# Patient Record
Sex: Male | Born: 1945
Health system: Southern US, Community
[De-identification: ages and names within clinical notes are randomized; demographics above are authoritative.]

## PROBLEM LIST (undated history)

## (undated) DIAGNOSIS — I4510 Unspecified right bundle-branch block: Secondary | ICD-10-CM

## (undated) DIAGNOSIS — R9431 Abnormal electrocardiogram [ECG] [EKG]: Secondary | ICD-10-CM

## (undated) DIAGNOSIS — I442 Atrioventricular block, complete: Secondary | ICD-10-CM

## (undated) DIAGNOSIS — G4733 Obstructive sleep apnea (adult) (pediatric): Secondary | ICD-10-CM

## (undated) DIAGNOSIS — R251 Tremor, unspecified: Secondary | ICD-10-CM

## (undated) DIAGNOSIS — J690 Pneumonitis due to inhalation of food and vomit: Secondary | ICD-10-CM

## (undated) DIAGNOSIS — G4730 Sleep apnea, unspecified: Secondary | ICD-10-CM

## (undated) DIAGNOSIS — G8929 Other chronic pain: Secondary | ICD-10-CM

## (undated) DIAGNOSIS — E785 Hyperlipidemia, unspecified: Secondary | ICD-10-CM

## (undated) DIAGNOSIS — F32A Depression, unspecified: Secondary | ICD-10-CM

## (undated) DIAGNOSIS — M51369 Other intervertebral disc degeneration, lumbar region without mention of lumbar back pain or lower extremity pain: Secondary | ICD-10-CM

## (undated) DIAGNOSIS — S83249A Other tear of medial meniscus, current injury, unspecified knee, initial encounter: Secondary | ICD-10-CM

## (undated) DIAGNOSIS — K219 Gastro-esophageal reflux disease without esophagitis: Secondary | ICD-10-CM

## (undated) DIAGNOSIS — G2581 Restless legs syndrome: Secondary | ICD-10-CM

## (undated) DIAGNOSIS — K4090 Unilateral inguinal hernia, without obstruction or gangrene, not specified as recurrent: Secondary | ICD-10-CM

## (undated) DIAGNOSIS — Z87442 Personal history of urinary calculi: Secondary | ICD-10-CM

## (undated) DIAGNOSIS — N200 Calculus of kidney: Secondary | ICD-10-CM

## (undated) DIAGNOSIS — N1831 Chronic kidney disease, stage 3a: Secondary | ICD-10-CM

## (undated) DIAGNOSIS — M2578 Osteophyte, vertebrae: Secondary | ICD-10-CM

## (undated) DIAGNOSIS — M5030 Other cervical disc degeneration, unspecified cervical region: Secondary | ICD-10-CM

## (undated) DIAGNOSIS — M545 Low back pain, unspecified: Secondary | ICD-10-CM

## (undated) DIAGNOSIS — F419 Anxiety disorder, unspecified: Secondary | ICD-10-CM

## (undated) DIAGNOSIS — K5730 Diverticulosis of large intestine without perforation or abscess without bleeding: Secondary | ICD-10-CM

## (undated) DIAGNOSIS — I700 Atherosclerosis of aorta: Secondary | ICD-10-CM

## (undated) DIAGNOSIS — R29818 Other symptoms and signs involving the nervous system: Secondary | ICD-10-CM

## (undated) DIAGNOSIS — K760 Fatty (change of) liver, not elsewhere classified: Secondary | ICD-10-CM

## (undated) DIAGNOSIS — M1990 Unspecified osteoarthritis, unspecified site: Secondary | ICD-10-CM

## (undated) DIAGNOSIS — E119 Type 2 diabetes mellitus without complications: Secondary | ICD-10-CM

## (undated) DIAGNOSIS — I10 Essential (primary) hypertension: Secondary | ICD-10-CM

## (undated) DIAGNOSIS — Z7982 Long term (current) use of aspirin: Secondary | ICD-10-CM

## (undated) DIAGNOSIS — R918 Other nonspecific abnormal finding of lung field: Secondary | ICD-10-CM

## (undated) DIAGNOSIS — I119 Hypertensive heart disease without heart failure: Secondary | ICD-10-CM

## (undated) DIAGNOSIS — M4306 Spondylolysis, lumbar region: Secondary | ICD-10-CM

## (undated) DIAGNOSIS — M5136 Other intervertebral disc degeneration, lumbar region: Secondary | ICD-10-CM

## (undated) DIAGNOSIS — I2510 Atherosclerotic heart disease of native coronary artery without angina pectoris: Secondary | ICD-10-CM

## (undated) DIAGNOSIS — N529 Male erectile dysfunction, unspecified: Secondary | ICD-10-CM

## (undated) DIAGNOSIS — R259 Unspecified abnormal involuntary movements: Secondary | ICD-10-CM

## (undated) DIAGNOSIS — K649 Unspecified hemorrhoids: Secondary | ICD-10-CM

## (undated) DIAGNOSIS — M549 Dorsalgia, unspecified: Secondary | ICD-10-CM

## (undated) HISTORY — DX: Abnormal electrocardiogram (ECG) (EKG): R94.31

## (undated) HISTORY — DX: Unspecified abnormal involuntary movements: R25.9

## (undated) HISTORY — DX: Sleep apnea, unspecified: G47.30

## (undated) HISTORY — DX: Hyperlipidemia, unspecified: E78.5

## (undated) HISTORY — PX: CIRCUMCISION: SUR203

## (undated) HISTORY — DX: Other symptoms and signs involving the nervous system: R29.818

## (undated) HISTORY — PX: HERNIA REPAIR: SHX51

## (undated) HISTORY — DX: Calculus of kidney: N20.0

## (undated) HISTORY — DX: Unspecified hemorrhoids: K64.9

---

## 1999-06-03 ENCOUNTER — Ambulatory Visit: Admission: RE | Admit: 1999-06-03 | Discharge: 1999-06-03 | Payer: Self-pay | Admitting: Family Medicine

## 2004-11-28 ENCOUNTER — Ambulatory Visit: Payer: Self-pay | Admitting: Family Medicine

## 2005-06-25 HISTORY — PX: COLONOSCOPY: SHX174

## 2005-12-20 ENCOUNTER — Ambulatory Visit: Payer: Self-pay | Admitting: General Surgery

## 2005-12-20 LAB — HM COLONOSCOPY

## 2006-06-12 ENCOUNTER — Ambulatory Visit: Payer: Self-pay | Admitting: Family Medicine

## 2010-01-09 ENCOUNTER — Ambulatory Visit: Payer: Self-pay | Admitting: Family Medicine

## 2010-05-10 ENCOUNTER — Ambulatory Visit: Payer: Self-pay | Admitting: Family Medicine

## 2010-12-18 ENCOUNTER — Ambulatory Visit: Payer: Self-pay | Admitting: Family Medicine

## 2011-04-05 ENCOUNTER — Ambulatory Visit: Payer: Self-pay | Admitting: Family Medicine

## 2012-04-22 ENCOUNTER — Ambulatory Visit: Payer: Self-pay | Admitting: Family Medicine

## 2012-06-25 HISTORY — PX: ROTATOR CUFF REPAIR: SHX139

## 2012-06-26 ENCOUNTER — Ambulatory Visit: Payer: Self-pay | Admitting: Specialist

## 2012-07-10 ENCOUNTER — Ambulatory Visit: Payer: Self-pay | Admitting: Specialist

## 2012-07-10 LAB — HEMOGLOBIN: HGB: 16.9 g/dL (ref 13.0–18.0)

## 2012-07-10 LAB — POTASSIUM: Potassium: 4.3 mmol/L (ref 3.5–5.1)

## 2012-07-23 ENCOUNTER — Ambulatory Visit: Payer: Self-pay | Admitting: Specialist

## 2013-03-11 ENCOUNTER — Ambulatory Visit: Payer: Self-pay | Admitting: Otolaryngology

## 2013-06-22 ENCOUNTER — Ambulatory Visit: Payer: Self-pay | Admitting: Family Medicine

## 2013-09-01 ENCOUNTER — Ambulatory Visit: Payer: Self-pay | Admitting: Specialist

## 2013-11-30 DIAGNOSIS — M5136 Other intervertebral disc degeneration, lumbar region: Secondary | ICD-10-CM | POA: Insufficient documentation

## 2013-11-30 DIAGNOSIS — M549 Dorsalgia, unspecified: Secondary | ICD-10-CM | POA: Insufficient documentation

## 2014-05-25 ENCOUNTER — Ambulatory Visit: Payer: Self-pay | Admitting: Family Medicine

## 2014-07-13 LAB — LIPID PANEL
Cholesterol: 172 mg/dL (ref 0–200)
HDL: 28 mg/dL — AB (ref 35–70)
LDL CALC: 100 mg/dL
LDl/HDL Ratio: 3.6
Triglycerides: 222 mg/dL — AB (ref 40–160)

## 2014-07-13 LAB — BASIC METABOLIC PANEL
BUN: 16 mg/dL (ref 4–21)
Creatinine: 1.3 mg/dL (ref 0.6–1.3)
GLUCOSE: 256 mg/dL
Potassium: 4.4 mmol/L (ref 3.4–5.3)
Sodium: 136 mmol/L — AB (ref 137–147)

## 2014-07-13 LAB — TSH: TSH: 2.1 u[IU]/mL (ref 0.41–5.90)

## 2014-07-13 LAB — CBC AND DIFFERENTIAL
HCT: 49 % (ref 41–53)
HEMOGLOBIN: 16.7 g/dL (ref 13.5–17.5)
Neutrophils Absolute: 4 /uL
Platelets: 186 10*3/uL (ref 150–399)
WBC: 8.3 10*3/mL

## 2014-07-13 LAB — HEMOGLOBIN A1C: Hgb A1c MFr Bld: 8.7 % — AB (ref 4.0–6.0)

## 2014-08-17 LAB — HEPATIC FUNCTION PANEL
ALT: 81 U/L — AB (ref 10–40)
AST: 100 U/L — AB (ref 14–40)
Alkaline Phosphatase: 190 U/L — AB (ref 25–125)
Bilirubin, Total: 0.8 mg/dL

## 2014-08-20 ENCOUNTER — Ambulatory Visit: Payer: Self-pay | Admitting: Family Medicine

## 2014-10-14 LAB — PSA: PSA: 0.3

## 2014-10-15 NOTE — Op Note (Signed)
PATIENT NAME:  Manuel Stafford MR#:  [id number] DATE OF BIRTH:  Oct 20, 1945  DATE OF PROCEDURE:  07/23/2012  PREOPERATIVE DIAGNOSES:  1.  Large rotator cuff tear, right shoulder.  2.  Severe acromioclavicular degenerative arthritis, right shoulder.   POSTOPERATIVE DIAGNOSES:  1.  Large rotator cuff tear, right shoulder.  2.  Severe acromioclavicular degenerative arthritis, right shoulder.   PROCEDURES: 1.  Open repair rotator cuff tear, right shoulder.  2.  Open excision, distal right clavicle.   SURGEON: Manuel Stafford, M.D.   ANESTHESIA:  General.  COMPLICATIONS: None.   ESTIMATED BLOOD LOSS: 125 mL.   DESCRIPTION OF PROCEDURE: Ancef 2 grams was given intravenously prior to the procedure. General anesthesia is induced. The patient is placed in the semi-beach chair position on the operating room table and secured with the right shoulder elevated using the beanbag. The right shoulder and arm were thoroughly prepped with alcohol and ChloraPrep and draped in standard sterile fashion. First, a transverse incision is made after infiltrating with 1% Xylocaine with epinephrine over the distal clavicle. The distal clavicle is seen to be extremely large and hypertrophic. The distal 1/2 inch of the clavicle is excised using the saw blade, the periosteal elevator and the rongeur. Hemostasis is maintained with the Bovie. The wound is thoroughly irrigated multiple times. The fascia and the subcutaneous tissue were closed over this using 2-0 Vicryl, and the skin is closed with the skin stapler. A separate anterior incision is then made over the midportion of the anterior acromion and extended distally. The dissection is carried down to the acromion, and the fascia is carefully reflected off of the acromion and preserved. The deltoid muscle is split for approximately 1-1/2 inches down to the deltopectoral fascia. This is incised, and there is immediately seen to be a large tear of the rotator cuff. This  actually consists of 2 tears.  There is a large triangular tear of the supraspinatus tendon, and then anteriorly, there is seen to be a significant tear of the subscapularis tendon. Anterior acromioplasty is performed with the rongeur and the oscillating rasp. The rotator cuff then is mobilized using 2 separate #2 Ethibond sutures in the edge. The rotator cuff is mobilized as much as possible using the finger and the periosteal elevator. The large V tear in the supraspinatus tendon is repaired first using interrupted #2 Ethibond sutures. The final position of the tear in the greater tuberosity area is then prepared using the oscillating rasp, and the edge of the supraspinatus is sewn down using #2 Ethibond in drill holes.  Range of motion demonstrated this portion of the repair to be secure. Attention is then placed anteriorly, where the residual portion of the subscapularis is mobilized and brought up and repaired likewise with #2 Ethibond sutures to the biceps tendon and the anterior leading edge of the supraspinatus tendon. Range of motion demonstrated satisfactory security of the repair. The wound is thoroughly irrigated multiple times. The deltoid is repaired with #2 Vicryl sutures. The subcutaneous tissue is infiltrated with 0.25% Marcaine with epinephrine. Subcutaneous tissue is closed with 3-0 Vicryl, and the skin is closed with the skin stapler. The subacromial bursa is then carefully injected with 10 mL of 0.25% Marcaine with epinephrine and 4 mg of morphine. A soft bulky dressing is applied. The patient's arm is then placed in an abduction-type sling and secured. The patient is returned to the recovery room in satisfactory condition, having tolerated the procedure quite well.     ____________________________ Manuel Stafford  Manuel Sacramento, MD ces:dm D: 07/24/2012 07:58:04 ET T: 07/24/2012 10:05:11 ET JOB#: [id number]  cc: Manuel Mallow, MD, <Dictator> Manuel Mallow MD ELECTRONICALLY SIGNED  07/24/2012 13:49

## 2014-12-28 ENCOUNTER — Encounter: Payer: Self-pay | Admitting: Urology

## 2014-12-28 ENCOUNTER — Ambulatory Visit (INDEPENDENT_AMBULATORY_CARE_PROVIDER_SITE_OTHER): Payer: Medicare HMO | Admitting: Urology

## 2014-12-28 DIAGNOSIS — R3129 Other microscopic hematuria: Secondary | ICD-10-CM

## 2014-12-28 DIAGNOSIS — R312 Other microscopic hematuria: Secondary | ICD-10-CM

## 2014-12-28 DIAGNOSIS — N471 Phimosis: Secondary | ICD-10-CM

## 2014-12-28 MED ORDER — CIPROFLOXACIN HCL 500 MG PO TABS
500.0000 mg | ORAL_TABLET | Freq: Once | ORAL | Status: AC
  Administered 2014-12-28: 500 mg via ORAL

## 2014-12-28 MED ORDER — LIDOCAINE HCL 2 % EX GEL
1.0000 "application " | Freq: Once | CUTANEOUS | Status: AC
  Administered 2014-12-28: 1 via URETHRAL

## 2014-12-28 NOTE — Progress Notes (Signed)
    12/28/2014 5:29 PM   Manuel Stafford. 1946-03-03 [id number]  Referring provider: No referring provider defined for this encounter.  No chief complaint on file.   HPI: Microhematuria postcircumcision. We'll need to do a cystoscopy today. I discussed this with the patient he realizes the importance of cystoscopy. We will also obtain a CAT scan on him if he's not had   PMH: History reviewed. No pertinent past medical history.  Surgical History: History reviewed. No pertinent past surgical history.  Home Medications:    Medication List    Notice  As of 12/28/2014  5:29 PM   You have not been prescribed any medications.      Allergies: Not on File  Family History: History reviewed. No pertinent family history.  Social History:  has no tobacco, alcohol, and drug history on file.  ROS: Urological Symptom Review  Patient is experiencing the following symptoms: Blood in urine   Review of Systems  Gastrointestinal (upper)  : Negative for upper GI symptoms  Gastrointestinal (lower) : Negative for lower GI symptoms  Constitutional : Negative for symptoms  Skin: Negative for skin symptoms  Eyes: Negative for eye symptoms  Ear/Nose/Throat : Negative for Ear/Nose/Throat symptoms  Hematologic/Lymphatic: Negative for Hematologic/Lymphatic symptoms  Cardiovascular : Negative for cardiovascular symptoms  Respiratory : Negative for respiratory symptoms  Endocrine: Negative for endocrine symptoms  Musculoskeletal: Negative for musculoskeletal symptoms  Neurological: Negative for neurological symptoms  Psychologic: Negative for psychiatric symptoms   Physical Exam: There were no vitals taken for this visit.  Constitutional:  Alert and oriented, No acute distress. HEENT: [place] AT, moist mucus membranes.  Trachea midline, no masses. Cardiovascular: No clubbing, cyanosis, or edema. Respiratory: Normal respiratory effort, no increased work of  breathing. GI: Abdomen is soft, nontender, nondistended, no abdominal masses GU: No CVA tenderness. Circumcision healing well Skin: No rashes, bruises or suspicious lesions. Lymph: No cervical or inguinal adenopathy. Neurologic: Grossly intact, no focal deficits, moving all 4 extremities. Psychiatric: Normal mood and affect.  Laboratory Data: Lab Results  Component Value Date   HGB 16.9 07/10/2012    No results found for: CREATININE  No results found for: PSA  No results found for: TESTOSTERONE  No results found for: HGBA1C  Urinalysis No results found for: COLORURINE, APPEARANCEUR, LABSPEC, PHURINE, GLUCOSEU, HGBUR, BILIRUBINUR, KETONESUR, PROTEINUR, UROBILINOGEN, NITRITE, LEUKOCYTESUR  Pertinent imaging-none  Assessment & Plan:  Cystoscopy for microhematuria  1. Phimosis  - Urinalysis, Complete   No Follow-up on file.  Collier Flowers, [facility] [address] [place], [place] [address] [phone]   Needed .Cystoscopy Procedure Note  Patient identification was confirmed, informed consent was obtained, and patient was prepped using Betadine solution.  Lidocaine jelly was administered per urethral meatus.    Preoperative abx where received prior to procedure.

## 2014-12-28 NOTE — Progress Notes (Signed)
    Cystoscopy Procedure Note  Patient identification was confirmed, informed consent was obtained, and patient was prepped using Betadine solution.  Lidocaine jelly was administered per urethral meatus.    Preoperative abx where received prior to procedure.     Pre-Procedure: - Inspection reveals a normal caliber ureteral meatus.  Procedure: The flexible cystoscope was introduced without difficulty - No urethral strictures/lesions are present. -  prostate normal prostate and no enlargement - Normal bladder neck - Bilateral ureteral orifices identified - Bladder mucosa  reveals no ulcers, tumors, or lesions - No bladder stones - No trabeculation  Retroflexion shows retroflexion shows a normal bladder neck and no tumors   Post-Procedure: - Patient tolerated the procedure well

## 2014-12-29 LAB — URINALYSIS, COMPLETE
Bilirubin, UA: NEGATIVE
Leukocytes, UA: NEGATIVE
Nitrite, UA: NEGATIVE
Protein, UA: NEGATIVE
SPEC GRAV UA: 1.02 (ref 1.005–1.030)
UUROB: 0.2 mg/dL (ref 0.2–1.0)
pH, UA: 5 (ref 5.0–7.5)

## 2014-12-29 LAB — MICROSCOPIC EXAMINATION: Bacteria, UA: NONE SEEN

## 2014-12-31 DIAGNOSIS — E559 Vitamin D deficiency, unspecified: Secondary | ICD-10-CM | POA: Insufficient documentation

## 2014-12-31 DIAGNOSIS — I10 Essential (primary) hypertension: Secondary | ICD-10-CM | POA: Insufficient documentation

## 2014-12-31 DIAGNOSIS — G4730 Sleep apnea, unspecified: Secondary | ICD-10-CM | POA: Insufficient documentation

## 2014-12-31 DIAGNOSIS — K760 Fatty (change of) liver, not elsewhere classified: Secondary | ICD-10-CM | POA: Insufficient documentation

## 2014-12-31 DIAGNOSIS — K219 Gastro-esophageal reflux disease without esophagitis: Secondary | ICD-10-CM | POA: Insufficient documentation

## 2014-12-31 DIAGNOSIS — E119 Type 2 diabetes mellitus without complications: Secondary | ICD-10-CM | POA: Insufficient documentation

## 2014-12-31 DIAGNOSIS — N529 Male erectile dysfunction, unspecified: Secondary | ICD-10-CM | POA: Insufficient documentation

## 2014-12-31 DIAGNOSIS — M47817 Spondylosis without myelopathy or radiculopathy, lumbosacral region: Secondary | ICD-10-CM | POA: Insufficient documentation

## 2014-12-31 DIAGNOSIS — B029 Zoster without complications: Secondary | ICD-10-CM | POA: Insufficient documentation

## 2014-12-31 DIAGNOSIS — E669 Obesity, unspecified: Secondary | ICD-10-CM | POA: Insufficient documentation

## 2014-12-31 DIAGNOSIS — E785 Hyperlipidemia, unspecified: Secondary | ICD-10-CM | POA: Insufficient documentation

## 2014-12-31 DIAGNOSIS — J309 Allergic rhinitis, unspecified: Secondary | ICD-10-CM | POA: Insufficient documentation

## 2014-12-31 DIAGNOSIS — M259 Joint disorder, unspecified: Secondary | ICD-10-CM | POA: Insufficient documentation

## 2015-01-04 ENCOUNTER — Ambulatory Visit
Admission: RE | Admit: 2015-01-04 | Discharge: 2015-01-04 | Disposition: A | Discharge status: Home / Self Care | Payer: Commercial Managed Care - HMO | Source: Ambulatory Visit | Attending: Urology | Admitting: Urology

## 2015-01-04 DIAGNOSIS — K760 Fatty (change of) liver, not elsewhere classified: Secondary | ICD-10-CM | POA: Insufficient documentation

## 2015-01-04 DIAGNOSIS — R911 Solitary pulmonary nodule: Secondary | ICD-10-CM | POA: Insufficient documentation

## 2015-01-04 DIAGNOSIS — N200 Calculus of kidney: Secondary | ICD-10-CM | POA: Diagnosis not present

## 2015-01-04 DIAGNOSIS — R319 Hematuria, unspecified: Secondary | ICD-10-CM | POA: Diagnosis not present

## 2015-01-04 DIAGNOSIS — R3129 Other microscopic hematuria: Secondary | ICD-10-CM

## 2015-01-04 DIAGNOSIS — R312 Other microscopic hematuria: Secondary | ICD-10-CM | POA: Diagnosis present

## 2015-01-04 HISTORY — DX: Essential (primary) hypertension: I10

## 2015-01-04 HISTORY — DX: Type 2 diabetes mellitus without complications: E11.9

## 2015-01-04 MED ORDER — IOHEXOL 300 MG/ML  SOLN
125.0000 mL | Freq: Once | INTRAMUSCULAR | Status: AC | PRN
  Administered 2015-01-04: 125 mL via INTRAVENOUS

## 2015-01-05 ENCOUNTER — Telehealth: Payer: Self-pay | Admitting: Urology

## 2015-01-05 ENCOUNTER — Other Ambulatory Visit: Payer: Self-pay | Admitting: Urology

## 2015-01-05 DIAGNOSIS — R3129 Other microscopic hematuria: Secondary | ICD-10-CM

## 2015-01-05 NOTE — Telephone Encounter (Signed)
Call from Kathryn at [facility] stating that patient called to notify her that he accidentally took his metformin today after having CTscan yesterday, he was instructed not to take 2 days post. Per Dr. Erlene Quan patient should have a BMP done tomorrow to check renal function. I notified stephanie and patient of this and scheduled patient for a lab visit at our office tomorrow.

## 2015-01-06 ENCOUNTER — Other Ambulatory Visit: Payer: Medicare HMO

## 2015-01-06 DIAGNOSIS — R3129 Other microscopic hematuria: Secondary | ICD-10-CM

## 2015-01-07 LAB — BASIC METABOLIC PANEL
BUN/Creatinine Ratio: 17 (ref 10–22)
BUN: 18 mg/dL (ref 8–27)
CALCIUM: 9.6 mg/dL (ref 8.6–10.2)
CO2: 22 mmol/L (ref 18–29)
CREATININE: 1.08 mg/dL (ref 0.76–1.27)
Chloride: 94 mmol/L — ABNORMAL LOW (ref 97–108)
GFR, EST AFRICAN AMERICAN: 80 mL/min/{1.73_m2} (ref >59)
GFR, EST NON AFRICAN AMERICAN: 69 mL/min/{1.73_m2} (ref >59)
Glucose: 314 mg/dL — ABNORMAL HIGH (ref 65–99)
Potassium: 4.4 mmol/L (ref 3.5–5.2)
SODIUM: 134 mmol/L (ref 134–144)

## 2015-01-27 ENCOUNTER — Ambulatory Visit: Payer: Self-pay | Admitting: Family Medicine

## 2015-02-07 ENCOUNTER — Encounter: Payer: Self-pay | Admitting: Family Medicine

## 2015-02-07 ENCOUNTER — Ambulatory Visit (INDEPENDENT_AMBULATORY_CARE_PROVIDER_SITE_OTHER): Payer: Commercial Managed Care - HMO | Admitting: Family Medicine

## 2015-02-07 VITALS — BP 138/64 | HR 84 | Temp 98.1°F | Resp 16 | Wt 246.0 lb

## 2015-02-07 DIAGNOSIS — E119 Type 2 diabetes mellitus without complications: Secondary | ICD-10-CM

## 2015-02-07 DIAGNOSIS — I10 Essential (primary) hypertension: Secondary | ICD-10-CM

## 2015-02-07 DIAGNOSIS — E669 Obesity, unspecified: Secondary | ICD-10-CM | POA: Diagnosis not present

## 2015-02-07 DIAGNOSIS — E785 Hyperlipidemia, unspecified: Secondary | ICD-10-CM

## 2015-02-07 DIAGNOSIS — K219 Gastro-esophageal reflux disease without esophagitis: Secondary | ICD-10-CM | POA: Diagnosis not present

## 2015-02-07 NOTE — Progress Notes (Signed)
Patient ID: Manuel Patella., male   DOB: 03/19/1946, 69 y.o.   MRN: [id number]    Subjective:  HPI  Hypertension: Patient here for follow-up of elevated blood pressure. He is not exercising and is not adherent to low salt diet.  Blood pressure is up and down slightly when he checks it at home-140s-130s/80s-70s well controlled at home. Cardiac symptoms none.  BP Readings from Last 3 Encounters:  02/07/15 138/64  11/17/14 144/68    He is not having any side effects. He is taking Losartan 10 mg and HCTZ 25 mg.   .  Diabetes Mellitus Type II, Follow-up:   Lab Results  Component Value Date   HGBA1C 8.7* 07/13/2014    Last seen for diabetes 6 months ago.  Management changes included none. He reports good compliance with treatment. He is not having side effects.  Current symptoms include none and have been stable. Home blood sugar records: fasting range: 130-140s  Episodes of hypoglycemia? no   Current Insulin Regimen: none Most Recent Eye Exam:  Weight trend: stable Prior visit with dietician: no Current diet: in general, an "unhealthy" diet Current exercise: none  Pertinent Labs:    Component Value Date/Time   CHOL 172 07/13/2014   TRIG 222* 07/13/2014   CREATININE 1.08 01/06/2015 0842   CREATININE 1.3 07/13/2014    Wt Readings from Last 3 Encounters:  02/07/15 246 lb (111.585 kg)  01/04/15 245 lb (111.131 kg)  11/17/14 248 lb (112.492 kg)       Prior to Admission medications   Medication Sig Start Date End Date Taking? Authorizing Provider  aspirin 81 MG tablet Take by mouth. 04/20/10  Yes Historical Provider, MD  Cholecalciferol 1000 UNITS tablet Take by mouth. 04/20/10  Yes Historical Provider, MD  diphenhydrAMINE (BENADRYL ALLERGY) 25 mg capsule Take by mouth.   Yes Historical Provider, MD  glucose blood (ACCU-CHEK SMARTVIEW) test strip ACCU-CHEK SMARTVIEW (In Vitro Strip)  1 (one) Strip Strip two times daily and as needed for 0 days  Quantity:  200;  Refills: 3   Ordered :15-Jun-2013  [name], MA, [name] ;  Started 15-Jun-2013 Active Comments: Medication taken as needed. Diagnosis code 250.00 06/15/13  Yes Historical Provider, MD  hydrochlorothiazide (HYDRODIURIL) 25 MG tablet Take by mouth. 11/17/14  Yes Historical Provider, MD  HYDROcodone-acetaminophen ([place]) 10-325 MG per tablet Take by mouth. 05/27/14  Yes Historical Provider, MD  losartan (COZAAR) 100 MG tablet Take 100 mg by mouth daily.  11/04/14  Yes Historical Provider, MD  metFORMIN (GLUCOPHAGE) 1000 MG tablet Take by mouth. 11/04/14  Yes Historical Provider, MD  MULTIPLE VITAMIN PO Take by mouth. 04/20/10  Yes Historical Provider, MD  omeprazole (PRILOSEC) 20 MG capsule Take by mouth. 11/04/14  Yes Historical Provider, MD  omeprazole (PRILOSEC) 20 MG capsule  11/08/14  Yes Historical Provider, MD  pravastatin (PRAVACHOL) 20 MG tablet Take by mouth. 11/04/14  Yes Historical Provider, MD  Saw Palmetto, Serenoa repens, (SAW PALMETTO BERRY) 160 MG CAPS Take by mouth. 04/20/10  Yes Historical Provider, MD  tadalafil (CIALIS) 5 MG tablet Take by mouth. 02/19/13  Yes Historical Provider, MD  tiZANidine (ZANAFLEX) 4 MG tablet  11/01/14  Yes Historical Provider, MD  traMADol (ULTRAM) 50 MG tablet Take by mouth.   Yes Historical Provider, MD    Patient Active Problem List   Diagnosis Date Noted  . Allergic rhinitis 12/31/2014  . Diabetes 12/31/2014  . Failure of erection 12/31/2014  . Fatty liver disease, nonalcoholic 08/65/7846  .  Acid reflux 12/31/2014  . BP (high blood pressure) 12/31/2014  . HLD (hyperlipidemia) 12/31/2014  . Lumbosacral spondylosis without myelopathy 12/31/2014  . Adiposity 12/31/2014  . Herpes zona 12/31/2014  . Disorder of shoulder 12/31/2014  . Apnea, sleep 12/31/2014  . Avitaminosis D 12/31/2014  . DDD (degenerative disc disease), lumbar 11/30/2013    Past Medical History  Diagnosis Date  . Hypertension   . Diabetes mellitus without  complication     Patient takes Metformin.    Social History   Social History  . Marital Status: Married    Spouse Name: N/A  . Number of Children: N/A  . Years of Education: N/A   Occupational History  . Not on file.   Social History Main Topics  . Smoking status: Former Smoker -- 2.00 packs/day for 15 years    Types: Cigarettes    Quit date: 06/25/1974  . Smokeless tobacco: Never Used  . Alcohol Use: No  . Drug Use: No  . Sexual Activity: Not Currently   Other Topics Concern  . Not on file   Social History Narrative    No Known Allergies  Review of Systems  Constitutional: Negative.   Eyes: Negative for blurred vision.  Respiratory: Negative.   Cardiovascular: Negative.   Gastrointestinal: Negative for heartburn, nausea, abdominal pain and diarrhea.  Musculoskeletal: Positive for neck pain. Negative for myalgias, back pain and joint pain.  Neurological: Negative for dizziness, tingling, tremors and headaches.  Psychiatric/Behavioral: Negative for depression. The patient is not nervous/anxious and does not have insomnia.     Immunization History  Administered Date(s) Administered  . Hepatitis A 08/13/2006  . Pneumococcal Conjugate-13 05/25/2014  . Pneumococcal Polysaccharide-23 04/08/2012  . Tdap 10/26/2005  . Zoster 08/13/2006, 04/17/2013   Objective:  BP 138/64 mmHg  Pulse 84  Temp(Src) 98.1 F (36.7 C)  Resp 16  Wt 246 lb (111.585 kg)  Physical Exam  Constitutional: He is oriented to person, place, and time and well-developed, well-nourished, and in no distress.  HENT:  Head: Normocephalic and atraumatic.  Right Ear: External ear normal.  Left Ear: External ear normal.  Nose: Nose normal.  Eyes: Conjunctivae are normal.  Cardiovascular: Normal rate, regular rhythm and normal heart sounds.   Pulmonary/Chest: Effort normal and breath sounds normal.  Abdominal: Soft.  Neurological: He is alert and oriented to person, place, and time. Gait normal.    Skin: Skin is warm and dry.  Psychiatric: Mood, memory, affect and judgment normal.    Lab Results  Component Value Date   WBC 8.3 07/13/2014   HGB 16.7 07/13/2014   HCT 49 07/13/2014   PLT 186 07/13/2014   GLUCOSE 314* 01/06/2015   CHOL 172 07/13/2014   TRIG 222* 07/13/2014   HDL 28* 07/13/2014   LDLCALC 100 07/13/2014   TSH 2.10 07/13/2014   PSA 0.3 10/14/2014   HGBA1C 8.7* 07/13/2014    CMP     Component Value Date/Time   NA 134 01/06/2015 0842   K 4.4 01/06/2015 0842   K 4.3 07/10/2012 0858   CL 94* 01/06/2015 0842   CO2 22 01/06/2015 0842   GLUCOSE 314* 01/06/2015 0842   BUN 18 01/06/2015 0842   CREATININE 1.08 01/06/2015 0842   CREATININE 1.3 07/13/2014   CALCIUM 9.6 01/06/2015 0842   AST 100* 08/17/2014   ALT 81* 08/17/2014   ALKPHOS 190* 08/17/2014   GFRNONAA 69 01/06/2015 0842   GFRAA 80 01/06/2015 0842    Assessment and Plan :  Hypertension  Improving Hyperlipidemia  Type 2 diabetes  Obesity Work on diet and exercise GERD  Kidney stone  Pulmonary nodule on CT scan This was found on CT of the kidney in June. Probably needs repeat CT without contrast in December or January  [facility] 02/07/2015 11:23 AM

## 2015-02-23 LAB — CBC WITH DIFFERENTIAL/PLATELET
BASOS ABS: 0 10*3/uL (ref 0.0–0.2)
Basos: 0 %
EOS (ABSOLUTE): 0.2 10*3/uL (ref 0.0–0.4)
EOS: 2 %
HEMATOCRIT: 44.6 % (ref 37.5–51.0)
HEMOGLOBIN: 15.3 g/dL (ref 12.6–17.7)
Immature Grans (Abs): 0 10*3/uL (ref 0.0–0.1)
Immature Granulocytes: 0 %
LYMPHS ABS: 2.6 10*3/uL (ref 0.7–3.1)
LYMPHS: 35 %
MCH: 33.7 pg — AB (ref 26.6–33.0)
MCHC: 34.3 g/dL (ref 31.5–35.7)
MCV: 98 fL — ABNORMAL HIGH (ref 79–97)
MONOCYTES: 8 %
Monocytes Absolute: 0.6 10*3/uL (ref 0.1–0.9)
NEUTROS ABS: 4 10*3/uL (ref 1.4–7.0)
Neutrophils: 55 %
Platelets: 170 10*3/uL (ref 150–379)
RBC: 4.54 x10E6/uL (ref 4.14–5.80)
RDW: 12.9 % (ref 12.3–15.4)
WBC: 7.3 10*3/uL (ref 3.4–10.8)

## 2015-02-23 LAB — COMPREHENSIVE METABOLIC PANEL
A/G RATIO: 1.5 (ref 1.1–2.5)
ALT: 57 IU/L — ABNORMAL HIGH (ref 0–44)
AST: 47 IU/L — ABNORMAL HIGH (ref 0–40)
Albumin: 4.1 g/dL (ref 3.5–4.8)
Alkaline Phosphatase: 120 IU/L — ABNORMAL HIGH (ref 39–117)
BUN/Creatinine Ratio: 16 (ref 10–22)
BUN: 18 mg/dL (ref 8–27)
Bilirubin Total: 0.5 mg/dL (ref 0.0–1.2)
CALCIUM: 9.7 mg/dL (ref 8.6–10.2)
CO2: 22 mmol/L (ref 18–29)
Chloride: 99 mmol/L (ref 97–108)
Creatinine, Ser: 1.15 mg/dL (ref 0.76–1.27)
GFR, EST AFRICAN AMERICAN: 74 mL/min/{1.73_m2} (ref >59)
GFR, EST NON AFRICAN AMERICAN: 64 mL/min/{1.73_m2} (ref >59)
Globulin, Total: 2.8 g/dL (ref 1.5–4.5)
Glucose: 177 mg/dL — ABNORMAL HIGH (ref 65–99)
Potassium: 4.6 mmol/L (ref 3.5–5.2)
SODIUM: 139 mmol/L (ref 134–144)
TOTAL PROTEIN: 6.9 g/dL (ref 6.0–8.5)

## 2015-02-23 LAB — LIPID PANEL WITH LDL/HDL RATIO
CHOLESTEROL TOTAL: 161 mg/dL (ref 100–199)
HDL: 32 mg/dL — ABNORMAL LOW (ref >39)
LDL CALC: 95 mg/dL (ref 0–99)
LDl/HDL Ratio: 3 ratio units (ref 0.0–3.6)
TRIGLYCERIDES: 168 mg/dL — AB (ref 0–149)
VLDL Cholesterol Cal: 34 mg/dL (ref 5–40)

## 2015-02-23 LAB — HEMOGLOBIN A1C
ESTIMATED AVERAGE GLUCOSE: 197 mg/dL
HEMOGLOBIN A1C: 8.5 % — AB (ref 4.8–5.6)

## 2015-02-23 LAB — TSH: TSH: 2 u[IU]/mL (ref 0.450–4.500)

## 2015-03-18 ENCOUNTER — Encounter: Payer: Self-pay | Admitting: Family Medicine

## 2015-03-22 ENCOUNTER — Other Ambulatory Visit: Payer: Self-pay

## 2015-03-22 MED ORDER — HYDROCHLOROTHIAZIDE 25 MG PO TABS: 25.0000 mg | ORAL_TABLET | Freq: Every day | ORAL | Status: DC

## 2015-03-23 ENCOUNTER — Ambulatory Visit (INDEPENDENT_AMBULATORY_CARE_PROVIDER_SITE_OTHER): Payer: Commercial Managed Care - HMO | Admitting: Family Medicine

## 2015-03-23 ENCOUNTER — Encounter: Payer: Self-pay | Admitting: Family Medicine

## 2015-03-23 ENCOUNTER — Telehealth: Payer: Self-pay

## 2015-03-23 VITALS — BP 112/70 | HR 78 | Temp 97.9°F | Resp 16 | Wt 239.8 lb

## 2015-03-23 DIAGNOSIS — R42 Dizziness and giddiness: Secondary | ICD-10-CM | POA: Diagnosis not present

## 2015-03-23 DIAGNOSIS — Z23 Encounter for immunization: Secondary | ICD-10-CM | POA: Diagnosis not present

## 2015-03-23 LAB — POCT CBG (FASTING - GLUCOSE)-MANUAL ENTRY: Glucose Fasting, POC: 111 mg/dL — AB (ref 70–99)

## 2015-03-23 MED ORDER — MECLIZINE HCL 25 MG PO TABS: 25.0000 mg | ORAL_TABLET | Freq: Three times a day (TID) | ORAL | Status: DC | PRN

## 2015-03-23 NOTE — Telephone Encounter (Signed)
Triage patient. Patient states is feeling dizzy since Sunday. No chest pain, no arm pain, no blurry visit. Blood pressure this morning is 131/80. Patient is scheduled this afternoon at 3:30.  Please advise if patient needs to be seen sooner.  Thanks,  -Joseline

## 2015-03-23 NOTE — Progress Notes (Signed)
Patient: Manuel Stafford. Male    DOB: 12-06-1945   69 y.o.   MRN: [id number] Visit Date: 03/23/2015  Today's Provider: [name], MD   Chief Complaint  Patient presents with  . Dizziness   Subjective:    Dizziness This is a new problem. The current episode started in the past 7 days. The problem occurs constantly. The problem has been unchanged (sometimes it gets worst). Associated symptoms include fatigue, nausea, vertigo and weakness. Pertinent negatives include no chest pain, fever, headaches, numbness, sore throat or visual change. The symptoms are aggravated by bending. He has tried drinking, rest and sleep for the symptoms.  Per patient Saturday his back was hurting and took hydrocodone and took another one Sunday morning and while at church patient started to feel dizzy.     No Known Allergies Previous Medications   ASPIRIN 81 MG TABLET    Take by mouth.   CHOLECALCIFEROL (VITAMIN D3) 2000 UNITS CAPSULE    Take 2,000 Units by mouth daily.   DIPHENHYDRAMINE (BENADRYL ALLERGY) 25 MG CAPSULE    Take by mouth.   GLUCOSE BLOOD (ACCU-CHEK SMARTVIEW) TEST STRIP    ACCU-CHEK SMARTVIEW (In Vitro Strip)  1 (one) Strip Strip two times daily and as needed for 0 days  Quantity: 200;  Refills: 3   Ordered :15-Jun-2013  [name], MA, [name] ;  Started 15-Jun-2013 Active Comments: Medication taken as needed. Diagnosis code 250.00   HYDROCHLOROTHIAZIDE (HYDRODIURIL) 25 MG TABLET    Take 1 tablet (25 mg total) by mouth daily.   HYDROCODONE-ACETAMINOPHEN (NORCO) 10-325 MG PER TABLET    Take by mouth.   LOSARTAN (COZAAR) 100 MG TABLET    Take 100 mg by mouth daily.    METFORMIN (GLUCOPHAGE) 1000 MG TABLET    Take by mouth.   MULTIPLE VITAMIN PO    Take by mouth.   OMEPRAZOLE (PRILOSEC) 20 MG CAPSULE    Take by mouth.   OMEPRAZOLE (PRILOSEC) 20 MG CAPSULE       PRAVASTATIN (PRAVACHOL) 20 MG TABLET    Take by mouth.   SAW PALMETTO, SERENOA REPENS, (SAW PALMETTO  BERRY) 160 MG CAPS    Take by mouth.   TIZANIDINE (ZANAFLEX) 4 MG TABLET       TRAMADOL (ULTRAM) 50 MG TABLET    Take by mouth.    Review of Systems  Constitutional: Positive for fatigue. Negative for fever.  HENT: Positive for postnasal drip. Negative for sore throat.   Eyes: Negative.   Respiratory: Negative.   Cardiovascular: Negative.  Negative for chest pain and palpitations.  Gastrointestinal: Positive for nausea.  Endocrine: Negative.   Genitourinary: Negative.   Musculoskeletal: Negative.   Skin: Negative.   Allergic/Immunologic: Negative.   Neurological: Positive for dizziness, vertigo, weakness and light-headedness. Negative for tremors, numbness and headaches.  Hematological: Negative.   Psychiatric/Behavioral: Negative.     Social History  Substance Use Topics  . Smoking status: Former Smoker -- 2.00 packs/day for 15 years    Types: Cigarettes    Quit date: 06/25/1974  . Smokeless tobacco: Never Used  . Alcohol Use: No   Objective:   BP 112/70 mmHg  Pulse 78  Temp(Src) 97.9 F (36.6 C) (Oral)  Resp 16  Wt 239 lb 12.8 oz (108.773 kg)  Physical Exam  Constitutional: He is oriented to person, place, and time. He appears well-developed and well-nourished.  HENT:  Head: Atraumatic.  Right Ear: External ear normal.  Left  Ear: External ear normal.  Nose: Nose normal.  Eyes: Conjunctivae and EOM are normal. Pupils are equal, round, and reactive to light.  Neck: Neck supple.  Cardiovascular: Normal rate, regular rhythm and normal heart sounds.   Pulmonary/Chest: Effort normal and breath sounds normal.  Abdominal: Soft.  Neurological: He is alert and oriented to person, place, and time.  Mild nystagmus noted.  Skin: Skin is warm and dry.  Psychiatric: He has a normal mood and affect. His behavior is normal. Judgment and thought content normal.        Assessment & Plan:     1. Dizziness/vertigo  - POCT CBG (Fasting - Glucose) - meclizine (ANTIVERT) 25  MG tablet; Take 1 tablet (25 mg total) by mouth 3 (three) times daily as needed for dizziness.  Dispense: 90 tablet; Refill: 2  2. Need for influenza vaccination  - Flu vaccine HIGH DOSE PF       Richard Cranford Mon, MD  [facility]

## 2015-03-23 NOTE — Telephone Encounter (Signed)
c 

## 2015-03-28 ENCOUNTER — Telehealth: Payer: Self-pay | Admitting: Family Medicine

## 2015-03-28 NOTE — Telephone Encounter (Signed)
Pt states he was seen last week.  Pt is still having sinus congestion and drainage.  Pt is request a Rx to help with this.  [facility].  QX#[phone]

## 2015-03-28 NOTE — Telephone Encounter (Signed)
Please below-aa

## 2015-03-29 MED ORDER — AMOXICILLIN 500 MG PO CAPS: ORAL_CAPSULE | ORAL | Status: DC

## 2015-03-29 NOTE — Telephone Encounter (Signed)
Amoxil 500mg ==2 po BID for 1 week.

## 2015-03-29 NOTE — Telephone Encounter (Signed)
RX sent in and pt advised-aa 

## 2015-04-28 ENCOUNTER — Ambulatory Visit (INDEPENDENT_AMBULATORY_CARE_PROVIDER_SITE_OTHER): Payer: Commercial Managed Care - HMO | Admitting: Family Medicine

## 2015-04-28 ENCOUNTER — Encounter: Payer: Self-pay | Admitting: Family Medicine

## 2015-04-28 VITALS — BP 140/60 | HR 96 | Temp 97.8°F | Resp 16 | Wt 238.0 lb

## 2015-04-28 DIAGNOSIS — J329 Chronic sinusitis, unspecified: Secondary | ICD-10-CM | POA: Diagnosis not present

## 2015-04-28 DIAGNOSIS — R918 Other nonspecific abnormal finding of lung field: Secondary | ICD-10-CM

## 2015-04-28 DIAGNOSIS — R634 Abnormal weight loss: Secondary | ICD-10-CM

## 2015-04-28 MED ORDER — DOXYCYCLINE HYCLATE 100 MG PO CAPS: 100.0000 mg | ORAL_CAPSULE | Freq: Two times a day (BID) | ORAL | Status: DC

## 2015-04-28 NOTE — Progress Notes (Signed)
Patient ID: Manuel Stafford., male   DOB: 09-Jul-1945, 69 y.o.   MRN: [id number]    Subjective:  HPI Pt is here because he is having weight loss. He reports that he was trying to lose weight and working out hard, he lost 10 pounds but he thought that he had lost more than he actually had once we reviewed the chart. Pt is concerned because he was told that he had "white spots" on his lung when he had a CT done. He wants to follow up on this and make sure it is not something to be concerned about.   Prior to Admission medications   Medication Sig Start Date End Date Taking? Authorizing Provider  aspirin 81 MG tablet Take by mouth. 04/20/10  Yes Historical Provider, MD  Cholecalciferol (VITAMIN D3) 2000 UNITS capsule Take 2,000 Units by mouth daily.   Yes Historical Provider, MD  diphenhydrAMINE (BENADRYL ALLERGY) 25 mg capsule Take by mouth.   Yes Historical Provider, MD  glucose blood (ACCU-CHEK SMARTVIEW) test strip ACCU-CHEK SMARTVIEW (In Vitro Strip)  1 (one) Strip Strip two times daily and as needed for 0 days  Quantity: 200;  Refills: 3   Ordered :15-Jun-2013  [name], MA, [name] ;  Started 15-Jun-2013 Active Comments: Medication taken as needed. Diagnosis code 250.00 06/15/13  Yes Historical Provider, MD  hydrochlorothiazide (HYDRODIURIL) 25 MG tablet Take 1 tablet (25 mg total) by mouth daily. 03/22/15  Yes Artemisia Auvil Maceo Pro., MD  HYDROcodone-acetaminophen [facility]) 10-325 MG per tablet Take by mouth. 05/27/14  Yes Historical Provider, MD  losartan (COZAAR) 100 MG tablet Take 100 mg by mouth daily.  11/04/14  Yes Historical Provider, MD  meclizine (ANTIVERT) 25 MG tablet Take 1 tablet (25 mg total) by mouth 3 (three) times daily as needed for dizziness. 03/23/15  Yes Manvir Thorson Maceo Pro., MD  metFORMIN (GLUCOPHAGE) 1000 MG tablet Take by mouth. 11/04/14  Yes Historical Provider, MD  MULTIPLE VITAMIN PO Take by mouth. 04/20/10  Yes Historical Provider, MD  omeprazole (PRILOSEC)  20 MG capsule Take by mouth. 11/04/14  Yes Historical Provider, MD  omeprazole (PRILOSEC) 20 MG capsule  11/08/14  Yes Historical Provider, MD  pravastatin (PRAVACHOL) 20 MG tablet Take by mouth. 11/04/14  Yes Historical Provider, MD  Saw Palmetto, Serenoa repens, (SAW PALMETTO BERRY) 160 MG CAPS Take by mouth. 04/20/10  Yes Historical Provider, MD  tiZANidine (ZANAFLEX) 4 MG tablet  11/01/14  Yes Historical Provider, MD  traMADol (ULTRAM) 50 MG tablet Take by mouth.   Yes Historical Provider, MD    Patient Active Problem List   Diagnosis Date Noted  . Allergic rhinitis 12/31/2014  . Diabetes ([place]) 12/31/2014  . Failure of erection 12/31/2014  . Fatty liver disease, nonalcoholic 83/15/1761  . Acid reflux 12/31/2014  . BP (high blood pressure) 12/31/2014  . HLD (hyperlipidemia) 12/31/2014  . Lumbosacral spondylosis without myelopathy 12/31/2014  . Adiposity 12/31/2014  . Herpes zona 12/31/2014  . Disorder of shoulder 12/31/2014  . Apnea, sleep 12/31/2014  . Avitaminosis D 12/31/2014  . DDD (degenerative disc disease), lumbar 11/30/2013    Past Medical History  Diagnosis Date  . Hypertension   . Diabetes mellitus without complication [facility]     Patient takes Metformin.    Social History   Social History  . Marital Status: Married    Spouse Name: N/A  . Number of Children: N/A  . Years of Education: N/A   Occupational History  . Not on file.  Social History Main Topics  . Smoking status: Former Smoker -- 2.00 packs/day for 15 years    Types: Cigarettes    Quit date: 06/25/1974  . Smokeless tobacco: Never Used  . Alcohol Use: No  . Drug Use: No  . Sexual Activity: Not Currently   Other Topics Concern  . Not on file   Social History Narrative    No Known Allergies  Review of Systems  Constitutional: Negative.   HENT: Negative.   Eyes: Negative.   Respiratory: Negative.   Cardiovascular: Negative.   Gastrointestinal: Negative.   Genitourinary: Negative.     Musculoskeletal: Negative.   Skin: Negative.   Neurological: Negative.   Endo/Heme/Allergies: Negative.   Psychiatric/Behavioral: Negative.     Immunization History  Administered Date(s) Administered  . Hepatitis A 08/13/2006  . Influenza, High Dose Seasonal PF 03/23/2015  . Pneumococcal Conjugate-13 05/25/2014  . Pneumococcal Polysaccharide-23 04/08/2012  . Tdap 10/26/2005  . Zoster 08/13/2006, 04/17/2013   Objective:  BP 140/60 mmHg  Pulse 96  Temp(Src) 97.8 F (36.6 C) (Oral)  Resp 16  Wt 238 lb (107.956 kg)  Physical Exam  Constitutional: He is oriented to person, place, and time and well-developed, well-nourished, and in no distress.  HENT:  Head: Normocephalic and atraumatic.  Right Ear: External ear normal.  Left Ear: External ear normal.  Nose: Nose normal.  Eyes: Conjunctivae are normal.  Neck: Neck supple.  Cardiovascular: Normal rate, regular rhythm and normal heart sounds.   Pulmonary/Chest: Effort normal and breath sounds normal.  Abdominal: Soft.  Neurological: He is alert and oriented to person, place, and time. Gait normal.  Skin: Skin is warm and dry.  Psychiatric: Mood, memory, affect and judgment normal.    Lab Results  Component Value Date   WBC 7.3 02/22/2015   HGB 16.7 07/13/2014   HCT 44.6 02/22/2015   PLT 186 07/13/2014   GLUCOSE 177* 02/22/2015   CHOL 161 02/22/2015   TRIG 168* 02/22/2015   HDL 32* 02/22/2015   LDLCALC 95 02/22/2015   TSH 2.000 02/22/2015   PSA 0.3 10/14/2014   HGBA1C 8.5* 02/22/2015    CMP     Component Value Date/Time   NA 139 02/22/2015 0902   K 4.6 02/22/2015 0902   K 4.3 07/10/2012 0858   CL 99 02/22/2015 0902   CO2 22 02/22/2015 0902   GLUCOSE 177* 02/22/2015 0902   BUN 18 02/22/2015 0902   CREATININE 1.15 02/22/2015 0902   CREATININE 1.3 07/13/2014   CALCIUM 9.7 02/22/2015 0902   PROT 6.9 02/22/2015 0902   ALBUMIN 4.1 02/22/2015 0902   AST 47* 02/22/2015 0902   ALT 57* 02/22/2015 0902    ALKPHOS 120* 02/22/2015 0902   BILITOT 0.5 02/22/2015 0902   GFRNONAA 64 02/22/2015 0902   GFRAA 74 02/22/2015 0902    Assessment and Plan :  1. Weight loss Pt intentionally lost 10lbs and has been stable since then with no further weigh loss. He feels ok.  2. Lung nodules Low risk for lung cancer. - CT CHEST LOW DOSE F/U SCREENING W/O CM; Future  3. Sinusitis, unspecified chronicity, unspecified location - doxycycline (VIBRAMYCIN) 100 MG capsule; Take 1 capsule (100 mg total) by mouth 2 (two) times daily.  Dispense: 20 capsule; Refill: 0 4.Obesity 5.TIIDM  I have done the exam and reviewed the above chart and it is accurate to the best of my knowledge.   Patient was seen and examined by Dr. Miguel Aschoff, and noted scribed by [place]  Felton Clinton, [facility] 04/28/2015 2:54 PM

## 2015-04-29 ENCOUNTER — Telehealth: Payer: Self-pay | Admitting: Family Medicine

## 2015-04-29 NOTE — Telephone Encounter (Signed)
The low dose chest CT is usually done for lung cancer screening.Can you check and make sure this is what needs to be ordered ? It's not the usual test for lung nodules,Thanks

## 2015-04-29 NOTE — Telephone Encounter (Signed)
Yes this is what he wanted. He did not want pt exposed to so much radiation and it would be cheaper for the pt. I was in the room with him when we ordered it. Thanks

## 2015-05-02 ENCOUNTER — Other Ambulatory Visit: Payer: Self-pay | Admitting: Family Medicine

## 2015-05-02 ENCOUNTER — Telehealth: Payer: Self-pay | Admitting: Family Medicine

## 2015-05-02 DIAGNOSIS — R918 Other nonspecific abnormal finding of lung field: Secondary | ICD-10-CM

## 2015-05-02 NOTE — Telephone Encounter (Signed)
Order corrected-aa

## 2015-05-02 NOTE — Telephone Encounter (Signed)
Per [facility] the order for low dose CT is incorrect if diagnosis of pulmonary nodules is used.Please put in order for CT of chest without contrast.Put low dose in notes

## 2015-05-13 ENCOUNTER — Ambulatory Visit
Admission: RE | Admit: 2015-05-13 | Discharge: 2015-05-13 | Disposition: A | Discharge status: Home / Self Care | Payer: Commercial Managed Care - HMO | Source: Ambulatory Visit | Attending: Family Medicine | Admitting: Family Medicine

## 2015-05-13 DIAGNOSIS — R918 Other nonspecific abnormal finding of lung field: Secondary | ICD-10-CM | POA: Diagnosis not present

## 2015-05-16 ENCOUNTER — Telehealth: Payer: Self-pay

## 2015-05-16 NOTE — Telephone Encounter (Signed)
Patient came by the office and wanted to let us know that when we review his CT results today to please call his cell [phone], thanks-aa

## 2015-05-16 NOTE — Telephone Encounter (Signed)
Done see other note-aa

## 2015-06-15 ENCOUNTER — Encounter: Payer: Self-pay | Admitting: Family Medicine

## 2015-06-15 ENCOUNTER — Ambulatory Visit (INDEPENDENT_AMBULATORY_CARE_PROVIDER_SITE_OTHER): Payer: Commercial Managed Care - HMO | Admitting: Family Medicine

## 2015-06-15 VITALS — BP 122/68 | HR 74 | Temp 98.0°F | Resp 16 | Wt 241.0 lb

## 2015-06-15 DIAGNOSIS — E785 Hyperlipidemia, unspecified: Secondary | ICD-10-CM | POA: Diagnosis not present

## 2015-06-15 DIAGNOSIS — R918 Other nonspecific abnormal finding of lung field: Secondary | ICD-10-CM | POA: Diagnosis not present

## 2015-06-15 DIAGNOSIS — I10 Essential (primary) hypertension: Secondary | ICD-10-CM | POA: Diagnosis not present

## 2015-06-15 DIAGNOSIS — R251 Tremor, unspecified: Secondary | ICD-10-CM

## 2015-06-15 DIAGNOSIS — E119 Type 2 diabetes mellitus without complications: Secondary | ICD-10-CM | POA: Diagnosis not present

## 2015-06-15 LAB — POCT GLYCOSYLATED HEMOGLOBIN (HGB A1C): HEMOGLOBIN A1C: 6.9

## 2015-06-15 NOTE — Progress Notes (Signed)
Patient ID: Manuel Stafford., male   DOB: 01-16-1946, 69 y.o.   MRN: [id number]    Subjective:  HPI  Diabetes Mellitus Type II, Follow-up:   Lab Results  Component Value Date   HGBA1C 8.5* 02/22/2015   HGBA1C 8.7* 07/13/2014    Last seen for diabetes 4 months ago.  Management since then includes none. He reports good compliance with treatment. He is not having side effects.  Current symptoms include none. Home blood sugar records: 140's  Episodes of hypoglycemia? no   Current Insulin Regimen: n/a Most Recent Eye Exam: 11/2014 Weight trend: stable Current exercise: none  Pertinent Labs:    Component Value Date/Time   CHOL 161 02/22/2015 0902   CHOL 172 07/13/2014   TRIG 168* 02/22/2015 0902   HDL 32* 02/22/2015 0902   HDL 28* 07/13/2014   LDLCALC 95 02/22/2015 0902   LDLCALC 100 07/13/2014   CREATININE 1.15 02/22/2015 0902   CREATININE 1.3 07/13/2014    Wt Readings from Last 3 Encounters:  06/15/15 241 lb (109.317 kg)  04/28/15 238 lb (107.956 kg)  03/23/15 239 lb 12.8 oz (108.773 kg)    ------------------------------------------------------------------------     Prior to Admission medications   Medication Sig Start Date End Date Taking? Authorizing Provider  aspirin 81 MG tablet Take by mouth. 04/20/10  Yes Historical Provider, MD  Cholecalciferol (VITAMIN D3) 2000 UNITS capsule Take 2,000 Units by mouth daily.   Yes Historical Provider, MD  diphenhydrAMINE (BENADRYL ALLERGY) 25 mg capsule Take by mouth.   Yes Historical Provider, MD  glucose blood (ACCU-CHEK SMARTVIEW) test strip ACCU-CHEK SMARTVIEW (In Vitro Strip)  1 (one) Strip Strip two times daily and as needed for 0 days  Quantity: 200;  Refills: 3   Ordered :15-Jun-2013  [name], MA, [name] ;  Started 15-Jun-2013 Active Comments: Medication taken as needed. Diagnosis code 250.00 06/15/13  Yes Historical Provider, MD  HYDROcodone-acetaminophen (NORCO) 10-325 MG per tablet Take by  mouth. 05/27/14  Yes Historical Provider, MD  losartan (COZAAR) 100 MG tablet Take 100 mg by mouth daily.  11/04/14  Yes Historical Provider, MD  meclizine (ANTIVERT) 25 MG tablet Take 1 tablet (25 mg total) by mouth 3 (three) times daily as needed for dizziness. 03/23/15  Yes Richard Maceo Pro., MD  metFORMIN (GLUCOPHAGE) 1000 MG tablet Take by mouth. 11/04/14  Yes Historical Provider, MD  MULTIPLE VITAMIN PO Take by mouth. 04/20/10  Yes Historical Provider, MD  omeprazole (PRILOSEC) 20 MG capsule  11/08/14  Yes Historical Provider, MD  pravastatin (PRAVACHOL) 20 MG tablet Take by mouth. 11/04/14  Yes Historical Provider, MD  Saw Palmetto, Serenoa repens, (SAW PALMETTO BERRY) 160 MG CAPS Take by mouth. 04/20/10  Yes Historical Provider, MD  tiZANidine (ZANAFLEX) 4 MG tablet  11/01/14  Yes Historical Provider, MD  traMADol (ULTRAM) 50 MG tablet Take by mouth.   Yes Historical Provider, MD  hydrochlorothiazide (HYDRODIURIL) 25 MG tablet Take 1 tablet (25 mg total) by mouth daily. 03/22/15   Richard Maceo Pro., MD    Patient Active Problem List   Diagnosis Date Noted  . Lung nodules 04/28/2015  . Allergic rhinitis 12/31/2014  . Diabetes ([place]) 12/31/2014  . Failure of erection 12/31/2014  . Fatty liver disease, nonalcoholic A999333  . Acid reflux 12/31/2014  . BP (high blood pressure) 12/31/2014  . HLD (hyperlipidemia) 12/31/2014  . Lumbosacral spondylosis without myelopathy 12/31/2014  . Adiposity 12/31/2014  . Herpes zona 12/31/2014  . Disorder of shoulder 12/31/2014  .  Apnea, sleep 12/31/2014  . Avitaminosis D 12/31/2014  . DDD (degenerative disc disease), lumbar 11/30/2013    Past Medical History  Diagnosis Date  . Hypertension   . Diabetes mellitus without complication [facility])     Patient takes Metformin.    Social History   Social History  . Marital Status: Married    Spouse Name: N/A  . Number of Children: N/A  . Years of Education: N/A   Occupational History  . Not  on file.   Social History Main Topics  . Smoking status: Former Smoker -- 2.00 packs/day for 15 years    Types: Cigarettes    Quit date: 06/25/1974  . Smokeless tobacco: Never Used  . Alcohol Use: No  . Drug Use: No  . Sexual Activity: Not Currently   Other Topics Concern  . Not on file   Social History Narrative    No Known Allergies  Review of Systems  Constitutional: Negative.   HENT: Negative.   Eyes: Negative.   Respiratory: Negative.   Cardiovascular: Negative.   Gastrointestinal: Negative.   Genitourinary: Negative.   Musculoskeletal: Negative.   Skin: Negative.   Neurological: Negative.   Endo/Heme/Allergies: Negative.   Psychiatric/Behavioral: Negative.     Immunization History  Administered Date(s) Administered  . Hepatitis A 08/13/2006  . Influenza, High Dose Seasonal PF 03/23/2015  . Pneumococcal Conjugate-13 05/25/2014  . Pneumococcal Polysaccharide-23 04/08/2012  . Tdap 10/26/2005  . Zoster 08/13/2006, 04/17/2013   Objective:  BP 122/68 mmHg  Pulse 74  Temp(Src) 98 F (36.7 C) (Oral)  Resp 16  Wt 241 lb (109.317 kg)  Physical Exam  Constitutional: He is oriented to person, place, and time and well-developed, well-nourished, and in no distress.  HENT:  Head: Normocephalic and atraumatic.  Right Ear: External ear normal.  Left Ear: External ear normal.  Eyes: Conjunctivae and EOM are normal. Pupils are equal, round, and reactive to light.  Neck: Normal range of motion. Neck supple.  Cardiovascular: Normal rate, regular rhythm, normal heart sounds and intact distal pulses.   Pulmonary/Chest: Effort normal and breath sounds normal.  Abdominal: Soft. Bowel sounds are normal.  Neurological: He is alert and oriented to person, place, and time. He has normal reflexes. Gait normal. GCS score is 15.  No codwheeling  Skin: Skin is warm and dry.  Psychiatric: Mood, memory, affect and judgment normal.    Lab Results  Component Value Date   WBC  7.3 02/22/2015   HGB 16.7 07/13/2014   HCT 44.6 02/22/2015   PLT 186 07/13/2014   GLUCOSE 177* 02/22/2015   CHOL 161 02/22/2015   TRIG 168* 02/22/2015   HDL 32* 02/22/2015   LDLCALC 95 02/22/2015   TSH 2.000 02/22/2015   PSA 0.3 10/14/2014   HGBA1C 8.5* 02/22/2015    CMP     Component Value Date/Time   NA 139 02/22/2015 0902   K 4.6 02/22/2015 0902   K 4.3 07/10/2012 0858   CL 99 02/22/2015 0902   CO2 22 02/22/2015 0902   GLUCOSE 177* 02/22/2015 0902   BUN 18 02/22/2015 0902   CREATININE 1.15 02/22/2015 0902   CREATININE 1.3 07/13/2014   CALCIUM 9.7 02/22/2015 0902   PROT 6.9 02/22/2015 0902   ALBUMIN 4.1 02/22/2015 0902   AST 47* 02/22/2015 0902   ALT 57* 02/22/2015 0902   ALKPHOS 120* 02/22/2015 0902   BILITOT 0.5 02/22/2015 0902   GFRNONAA 64 02/22/2015 0902   GFRAA 74 02/22/2015 0902    Assessment and  Plan :  1. Type 2 diabetes mellitus without complication, unspecified long term insulin use status ([place]) Patient has lost a total of 15 pounds this year with diet and exercise. Encouraged him to continue this. He is excited about these positive changes. - POCT HgB A1C- 6.9 today. Better continue diet and exercise.  2. Tremor   3. Essential hypertension   4. HLD (hyperlipidemia)  5. Lung Nodules- Will repeat chest CT November 2017 In reformed smoke remotely. I have done the exam and reviewed the above chart and it is accurate to the best of my knowledge.  Patient was seen and examined by Dr. Miguel Aschoff, and noted scribed by Webb Laws, [place] MD [facility] 06/15/2015 8:33 AM

## 2015-06-26 DIAGNOSIS — K649 Unspecified hemorrhoids: Secondary | ICD-10-CM

## 2015-06-26 DIAGNOSIS — N200 Calculus of kidney: Secondary | ICD-10-CM

## 2015-06-26 HISTORY — DX: Unspecified hemorrhoids: K64.9

## 2015-06-26 HISTORY — DX: Calculus of kidney: N20.0

## 2015-07-20 DIAGNOSIS — L812 Freckles: Secondary | ICD-10-CM | POA: Diagnosis not present

## 2015-07-20 DIAGNOSIS — D485 Neoplasm of uncertain behavior of skin: Secondary | ICD-10-CM | POA: Diagnosis not present

## 2015-07-20 DIAGNOSIS — Z1283 Encounter for screening for malignant neoplasm of skin: Secondary | ICD-10-CM | POA: Diagnosis not present

## 2015-07-20 DIAGNOSIS — I8393 Asymptomatic varicose veins of bilateral lower extremities: Secondary | ICD-10-CM | POA: Diagnosis not present

## 2015-07-20 DIAGNOSIS — L739 Follicular disorder, unspecified: Secondary | ICD-10-CM | POA: Diagnosis not present

## 2015-07-20 DIAGNOSIS — L820 Inflamed seborrheic keratosis: Secondary | ICD-10-CM | POA: Diagnosis not present

## 2015-07-20 DIAGNOSIS — D1800 Hemangioma unspecified site: Secondary | ICD-10-CM | POA: Diagnosis not present

## 2015-07-20 DIAGNOSIS — L821 Other seborrheic keratosis: Secondary | ICD-10-CM | POA: Diagnosis not present

## 2015-07-20 DIAGNOSIS — D229 Melanocytic nevi, unspecified: Secondary | ICD-10-CM | POA: Diagnosis not present

## 2015-07-20 DIAGNOSIS — B353 Tinea pedis: Secondary | ICD-10-CM | POA: Diagnosis not present

## 2015-07-20 DIAGNOSIS — L719 Rosacea, unspecified: Secondary | ICD-10-CM | POA: Diagnosis not present

## 2015-08-02 ENCOUNTER — Ambulatory Visit (INDEPENDENT_AMBULATORY_CARE_PROVIDER_SITE_OTHER): Payer: PPO | Admitting: Family Medicine

## 2015-08-02 ENCOUNTER — Other Ambulatory Visit: Payer: Self-pay | Admitting: Family Medicine

## 2015-08-02 ENCOUNTER — Encounter: Payer: Self-pay | Admitting: Family Medicine

## 2015-08-02 VITALS — BP 138/80 | HR 96 | Temp 97.8°F | Resp 18 | Wt 242.0 lb

## 2015-08-02 DIAGNOSIS — J0100 Acute maxillary sinusitis, unspecified: Secondary | ICD-10-CM

## 2015-08-02 MED ORDER — PRAVASTATIN SODIUM 20 MG PO TABS: 20.0000 mg | ORAL_TABLET | Freq: Every day | ORAL | Status: DC

## 2015-08-02 MED ORDER — AMOXICILLIN-POT CLAVULANATE 875-125 MG PO TABS: 1.0000 | ORAL_TABLET | Freq: Two times a day (BID) | ORAL | Status: DC

## 2015-08-02 MED ORDER — HYDROCHLOROTHIAZIDE 25 MG PO TABS: 25.0000 mg | ORAL_TABLET | Freq: Every day | ORAL | Status: DC

## 2015-08-02 MED ORDER — METFORMIN HCL 1000 MG PO TABS: 1000.0000 mg | ORAL_TABLET | Freq: Two times a day (BID) | ORAL | Status: DC

## 2015-08-02 MED ORDER — LOSARTAN POTASSIUM 100 MG PO TABS: 100.0000 mg | ORAL_TABLET | Freq: Every day | ORAL | Status: DC

## 2015-08-02 MED ORDER — TRAMADOL HCL 50 MG PO TABS: 50.0000 mg | ORAL_TABLET | Freq: Every day | ORAL | Status: DC

## 2015-08-02 MED ORDER — OMEPRAZOLE 20 MG PO CPDR: 20.0000 mg | DELAYED_RELEASE_CAPSULE | Freq: Two times a day (BID) | ORAL | Status: DC

## 2015-08-02 NOTE — Progress Notes (Signed)
Patient ID: Manuel Stafford., male   DOB: 1946/03/16, 70 y.o.   MRN: [id number]    Subjective:  HPI Pt reports that he started having URI symptoms last Friday night/Saturday. He has had a low grade fever of about 100.4 each night and then not in the am. He has sinus congestion (green mucus), cough, sneezing, and sore throat. Denies SOB, and body aches.  Prior to Admission medications   Medication Sig Start Date End Date Taking? Authorizing Provider  aspirin 81 MG tablet Take by mouth. 04/20/10  Yes Historical Provider, MD  Cholecalciferol (VITAMIN D3) 2000 UNITS capsule Take 2,000 Units by mouth daily.   Yes Historical Provider, MD  diphenhydrAMINE (BENADRYL ALLERGY) 25 mg capsule Take by mouth.   Yes Historical Provider, MD  glucose blood (ACCU-CHEK SMARTVIEW) test strip ACCU-CHEK SMARTVIEW (In Vitro Strip)  1 (one) Strip Strip two times daily and as needed for 0 days  Quantity: 200;  Refills: 3   Ordered :15-Jun-2013  [name], MA, [name] ;  Started 15-Jun-2013 Active Comments: Medication taken as needed. Diagnosis code 250.00 06/15/13  Yes Historical Provider, MD  hydrochlorothiazide (HYDRODIURIL) 25 MG tablet Take 1 tablet (25 mg total) by mouth daily. 03/22/15  Yes Richard Maceo Pro., MD  HYDROcodone-acetaminophen [facility]) 10-325 MG per tablet Take by mouth. 05/27/14  Yes Historical Provider, MD  losartan (COZAAR) 100 MG tablet Take 100 mg by mouth daily.  11/04/14  Yes Historical Provider, MD  meclizine (ANTIVERT) 25 MG tablet Take 1 tablet (25 mg total) by mouth 3 (three) times daily as needed for dizziness. 03/23/15  Yes Richard Maceo Pro., MD  metFORMIN (GLUCOPHAGE) 1000 MG tablet Take by mouth. 11/04/14  Yes Historical Provider, MD  MULTIPLE VITAMIN PO Take by mouth. 04/20/10  Yes Historical Provider, MD  omeprazole (PRILOSEC) 20 MG capsule  11/08/14  Yes Historical Provider, MD  pravastatin (PRAVACHOL) 20 MG tablet Take by mouth. 11/04/14  Yes Historical Provider, MD    Saw Palmetto, Serenoa repens, (SAW PALMETTO BERRY) 160 MG CAPS Take by mouth. 04/20/10  Yes Historical Provider, MD  tiZANidine (ZANAFLEX) 4 MG tablet  11/01/14  Yes Historical Provider, MD  traMADol (ULTRAM) 50 MG tablet Take by mouth.   Yes Historical Provider, MD    Patient Active Problem List   Diagnosis Date Noted  . Lung nodules 04/28/2015  . Allergic rhinitis 12/31/2014  . Diabetes ([place]) 12/31/2014  . Failure of erection 12/31/2014  . Fatty liver disease, nonalcoholic A999333  . Acid reflux 12/31/2014  . BP (high blood pressure) 12/31/2014  . HLD (hyperlipidemia) 12/31/2014  . Lumbosacral spondylosis without myelopathy 12/31/2014  . Adiposity 12/31/2014  . Herpes zona 12/31/2014  . Disorder of shoulder 12/31/2014  . Apnea, sleep 12/31/2014  . Avitaminosis D 12/31/2014  . DDD (degenerative disc disease), lumbar 11/30/2013    Past Medical History  Diagnosis Date  . Hypertension   . Diabetes mellitus without complication [facility])     Patient takes Metformin.    Social History   Social History  . Marital Status: Married    Spouse Name: N/A  . Number of Children: N/A  . Years of Education: N/A   Occupational History  . Not on file.   Social History Main Topics  . Smoking status: Former Smoker -- 2.00 packs/day for 15 years    Types: Cigarettes    Quit date: 06/25/1974  . Smokeless tobacco: Never Used  . Alcohol Use: No  . Drug Use: No  . Sexual Activity:  Not Currently   Other Topics Concern  . Not on file   Social History Narrative    No Known Allergies  Review of Systems  Constitutional: Positive for fever and malaise/fatigue.  HENT: Positive for congestion and sore throat.   Eyes: Negative.   Respiratory: Positive for cough.   Cardiovascular: Negative.   Gastrointestinal: Negative.   Genitourinary: Negative.   Musculoskeletal: Negative.   Skin: Negative.   Neurological: Negative.   Endo/Heme/Allergies: Negative.   Psychiatric/Behavioral:  Negative.     Immunization History  Administered Date(s) Administered  . Hepatitis A 08/13/2006  . Influenza, High Dose Seasonal PF 03/23/2015  . Pneumococcal Conjugate-13 05/25/2014  . Pneumococcal Polysaccharide-23 04/08/2012  . Tdap 10/26/2005  . Zoster 08/13/2006, 04/17/2013   Objective:  BP 138/80 mmHg  Pulse 96  Temp(Src) 97.8 F (36.6 C) (Oral)  Resp 18  Wt 242 lb (109.77 kg)  SpO2 95%  Physical Exam  Constitutional: He is oriented to person, place, and time and well-developed, well-nourished, and in no distress.  HENT:  Head: Normocephalic and atraumatic.  Right Ear: External ear normal.  Left Ear: External ear normal.  Nose: Nose normal.  Mouth/Throat: Oropharynx is clear and moist.  Eyes: Conjunctivae and EOM are normal. Pupils are equal, round, and reactive to light.  Neck: Normal range of motion. Neck supple.  Cardiovascular: Normal rate, regular rhythm, normal heart sounds and intact distal pulses.   Pulmonary/Chest: Effort normal and breath sounds normal.  Musculoskeletal: Normal range of motion.  Neurological: He is alert and oriented to person, place, and time. He has normal reflexes. Gait normal. GCS score is 15.  Skin: Skin is warm and dry.  Psychiatric: Mood, memory, affect and judgment normal.    Lab Results  Component Value Date   WBC 7.3 02/22/2015   HGB 16.7 07/13/2014   HCT 44.6 02/22/2015   PLT 170 02/22/2015   GLUCOSE 177* 02/22/2015   CHOL 161 02/22/2015   TRIG 168* 02/22/2015   HDL 32* 02/22/2015   LDLCALC 95 02/22/2015   TSH 2.000 02/22/2015   PSA 0.3 10/14/2014   HGBA1C 6.9 06/15/2015    CMP     Component Value Date/Time   NA 139 02/22/2015 0902   K 4.6 02/22/2015 0902   K 4.3 07/10/2012 0858   CL 99 02/22/2015 0902   CO2 22 02/22/2015 0902   GLUCOSE 177* 02/22/2015 0902   BUN 18 02/22/2015 0902   CREATININE 1.15 02/22/2015 0902   CREATININE 1.3 07/13/2014   CALCIUM 9.7 02/22/2015 0902   PROT 6.9 02/22/2015 0902    ALBUMIN 4.1 02/22/2015 0902   AST 47* 02/22/2015 0902   ALT 57* 02/22/2015 0902   ALKPHOS 120* 02/22/2015 0902   BILITOT 0.5 02/22/2015 0902   GFRNONAA 64 02/22/2015 0902   GFRAA 74 02/22/2015 0902    Assessment and Plan :  1. Acute maxillary sinusitis, recurrence not specified Instructed to use Robitussin and increase fluids.   - amoxicillin-clavulanate (AUGMENTIN) 875-125 MG tablet; Take 1 tablet by mouth 2 (two) times daily.  Dispense: 20 tablet; Refill: 0 2.TIIDM Patient was seen and examined by Dr. Miguel Aschoff, and noted scribed by Webb Laws, [place] MD [facility] 08/02/2015 11:26 AM

## 2015-08-10 ENCOUNTER — Ambulatory Visit (INDEPENDENT_AMBULATORY_CARE_PROVIDER_SITE_OTHER): Payer: PPO | Admitting: Family Medicine

## 2015-08-10 VITALS — BP 140/70 | HR 92 | Temp 98.1°F | Resp 16 | Wt 237.0 lb

## 2015-08-10 DIAGNOSIS — R509 Fever, unspecified: Secondary | ICD-10-CM | POA: Diagnosis not present

## 2015-08-10 DIAGNOSIS — E785 Hyperlipidemia, unspecified: Secondary | ICD-10-CM

## 2015-08-10 DIAGNOSIS — E119 Type 2 diabetes mellitus without complications: Secondary | ICD-10-CM

## 2015-08-10 DIAGNOSIS — M79602 Pain in left arm: Secondary | ICD-10-CM | POA: Diagnosis not present

## 2015-08-10 DIAGNOSIS — J329 Chronic sinusitis, unspecified: Secondary | ICD-10-CM | POA: Diagnosis not present

## 2015-08-10 MED ORDER — DOXYCYCLINE HYCLATE 100 MG PO TABS: 100.0000 mg | ORAL_TABLET | Freq: Two times a day (BID) | ORAL | Status: DC

## 2015-08-10 NOTE — Progress Notes (Signed)
Patient ID: Manuel Patella., male   DOB: [birth year], 70 y.o.   MRN: [id number]   Manuel Stafford.  MRN: [id number] DOB: July 01, 1945  Subjective:  HPI   1. Sinusitis, unspecified chronicity, unspecified location The patient is a 70 ear old male who returns today with continued sinusitis symptoms.  He states he feels pretty good during the day but about 6 pm every day he begins running fevers of 100 or more.  He is still on Amoxicilllin.  Patient Active Problem List   Diagnosis Date Noted  . Lung nodules 04/28/2015  . Allergic rhinitis 12/31/2014  . Diabetes ([place]) 12/31/2014  . Failure of erection 12/31/2014  . Fatty liver disease, nonalcoholic A999333  . Acid reflux 12/31/2014  . BP (high blood pressure) 12/31/2014  . HLD (hyperlipidemia) 12/31/2014  . Lumbosacral spondylosis without myelopathy 12/31/2014  . Adiposity 12/31/2014  . Herpes zona 12/31/2014  . Disorder of shoulder 12/31/2014  . Apnea, sleep 12/31/2014  . Avitaminosis D 12/31/2014  . DDD (degenerative disc disease), lumbar 11/30/2013    Past Medical History  Diagnosis Date  . Hypertension   . Diabetes mellitus without complication [facility])     Patient takes Metformin.    Social History   Social History  . Marital Status: Married    Spouse Name: N/A  . Number of Children: N/A  . Years of Education: N/A   Occupational History  . Not on file.   Social History Main Topics  . Smoking status: Former Smoker -- 2.00 packs/day for 15 years    Types: Cigarettes    Quit date: 06/25/1974  . Smokeless tobacco: Never Used  . Alcohol Use: No  . Drug Use: No  . Sexual Activity: Not Currently   Other Topics Concern  . Not on file   Social History Narrative    Outpatient Prescriptions Prior to Visit  Medication Sig Dispense Refill  . amoxicillin-clavulanate (AUGMENTIN) 875-125 MG tablet Take 1 tablet by mouth 2 (two) times daily. 20 tablet 0  . aspirin 81 MG tablet Take by mouth.    .  Cholecalciferol (VITAMIN D3) 2000 UNITS capsule Take 2,000 Units by mouth daily.    . diphenhydrAMINE (BENADRYL ALLERGY) 25 mg capsule Take by mouth.    [name] glucose blood (ACCU-CHEK SMARTVIEW) test strip ACCU-CHEK SMARTVIEW (In Vitro Strip)  1 (one) Strip Strip two times daily and as needed for 0 days  Quantity: 200;  Refills: 3   Ordered :15-Jun-2013  [name], MA, [name] ;  Started 15-Jun-2013 Active Comments: Medication taken as needed. Diagnosis code 250.00    . hydrochlorothiazide (HYDRODIURIL) 25 MG tablet Take 1 tablet (25 mg total) by mouth daily. 90 tablet 3  . HYDROcodone-acetaminophen (NORCO) 10-325 MG per tablet Take by mouth.    . losartan (COZAAR) 100 MG tablet TAKE 1 TABLET BY MOUTH ONCE A DAY 90 tablet 3  . meclizine (ANTIVERT) 25 MG tablet Take 1 tablet (25 mg total) by mouth 3 (three) times daily as needed for dizziness. 90 tablet 2  . metFORMIN (GLUCOPHAGE) 1000 MG tablet Take 1 tablet (1,000 mg total) by mouth 2 (two) times daily with a meal. 180 tablet 3  . MULTIPLE VITAMIN PO Take by mouth.    [name] omeprazole (PRILOSEC) 20 MG capsule Take 1 capsule (20 mg total) by mouth 2 (two) times daily before a meal. 180 capsule 3  . pravastatin (PRAVACHOL) 20 MG tablet Take 1 tablet (20 mg total) by mouth daily. 90 tablet 3  .  Saw Palmetto, Serenoa repens, (SAW PALMETTO BERRY) 160 MG CAPS Take by mouth.    [name] tiZANidine (ZANAFLEX) 4 MG tablet     . traMADol (ULTRAM) 50 MG tablet Take 1 tablet (50 mg total) by mouth at bedtime. 90 tablet 1   No facility-administered medications prior to visit.    No Known Allergies  Review of Systems  Constitutional: Positive for fever, chills, malaise/fatigue and diaphoresis.  HENT: Positive for congestion. Negative for ear discharge, ear pain, hearing loss, nosebleeds, sore throat and tinnitus.   Eyes: Positive for discharge. Negative for blurred vision, double vision, photophobia, pain and redness.  Respiratory: Positive for cough, sputum  production and wheezing. Negative for hemoptysis and shortness of breath.   Cardiovascular: Negative for chest pain, palpitations, orthopnea and leg swelling.  Neurological: Positive for weakness. Negative for headaches.   Objective:  BP 140/70 mmHg  Pulse 92  Temp(Src) 98.1 F (36.7 C) (Oral)  Resp 16  Wt 237 lb (107.502 kg)  Physical Exam  Constitutional: He is oriented to person, place, and time and well-developed, well-nourished, and in no distress.  HENT:  Head: Normocephalic and atraumatic.  Right Ear: External ear normal.  Left Ear: External ear normal.  Nose: Nose normal.  Mouth/Throat: Oropharynx is clear and moist.  Eyes: Conjunctivae are normal. Pupils are equal, round, and reactive to light.  Neck: Normal range of motion. Neck supple.  Cardiovascular: Normal rate, regular rhythm and normal heart sounds.   Pulmonary/Chest: Effort normal and breath sounds normal.  Abdominal: Soft.  Lymphadenopathy:    He has no cervical adenopathy.  Neurological: He is alert and oriented to person, place, and time. Gait normal.  Skin: Skin is warm and dry.  Psychiatric: Mood, memory, affect and judgment normal.    Assessment and Plan :  Sinusitis, unspecified chronicity, unspecified location Febrile Illness Begin w/u if fevers continue will be looking for etiology.Start with labs.Barkley Bruns. Moca MD [facility] 08/10/2015 4:08 PM

## 2015-08-11 DIAGNOSIS — J329 Chronic sinusitis, unspecified: Secondary | ICD-10-CM | POA: Diagnosis not present

## 2015-08-11 DIAGNOSIS — R509 Fever, unspecified: Secondary | ICD-10-CM | POA: Diagnosis not present

## 2015-08-11 DIAGNOSIS — R0989 Other specified symptoms and signs involving the circulatory and respiratory systems: Secondary | ICD-10-CM | POA: Diagnosis not present

## 2015-08-11 DIAGNOSIS — R05 Cough: Secondary | ICD-10-CM | POA: Diagnosis not present

## 2015-08-11 DIAGNOSIS — J9811 Atelectasis: Secondary | ICD-10-CM | POA: Diagnosis not present

## 2015-08-12 ENCOUNTER — Encounter: Payer: Self-pay | Admitting: Physician Assistant

## 2015-08-12 ENCOUNTER — Ambulatory Visit (INDEPENDENT_AMBULATORY_CARE_PROVIDER_SITE_OTHER): Payer: PPO | Admitting: Physician Assistant

## 2015-08-12 ENCOUNTER — Other Ambulatory Visit: Payer: Self-pay | Admitting: Family Medicine

## 2015-08-12 ENCOUNTER — Other Ambulatory Visit: Payer: Self-pay | Admitting: Emergency Medicine

## 2015-08-12 VITALS — BP 150/70 | HR 84 | Temp 97.3°F | Resp 16 | Wt 237.0 lb

## 2015-08-12 DIAGNOSIS — R509 Fever, unspecified: Secondary | ICD-10-CM

## 2015-08-12 DIAGNOSIS — N200 Calculus of kidney: Secondary | ICD-10-CM

## 2015-08-12 DIAGNOSIS — R319 Hematuria, unspecified: Secondary | ICD-10-CM

## 2015-08-12 LAB — COMPREHENSIVE METABOLIC PANEL
A/G RATIO: 1.5 (ref 1.1–2.5)
ALT: 74 IU/L — AB (ref 0–44)
AST: 54 IU/L — ABNORMAL HIGH (ref 0–40)
Albumin: 4.4 g/dL (ref 3.5–4.8)
Alkaline Phosphatase: 99 IU/L (ref 39–117)
BILIRUBIN TOTAL: 1.1 mg/dL (ref 0.0–1.2)
BUN/Creatinine Ratio: 22 (ref 10–22)
BUN: 26 mg/dL (ref 8–27)
CHLORIDE: 97 mmol/L (ref 96–106)
CO2: 23 mmol/L (ref 18–29)
Calcium: 10 mg/dL (ref 8.6–10.2)
Creatinine, Ser: 1.18 mg/dL (ref 0.76–1.27)
GFR calc Af Amer: 72 mL/min/{1.73_m2} (ref >59)
GFR calc non Af Amer: 62 mL/min/{1.73_m2} (ref >59)
GLOBULIN, TOTAL: 3 g/dL (ref 1.5–4.5)
Glucose: 169 mg/dL — ABNORMAL HIGH (ref 65–99)
POTASSIUM: 4.4 mmol/L (ref 3.5–5.2)
SODIUM: 139 mmol/L (ref 134–144)
Total Protein: 7.4 g/dL (ref 6.0–8.5)

## 2015-08-12 LAB — CBC WITH DIFFERENTIAL/PLATELET
BASOS: 1 %
Basophils Absolute: 0.1 10*3/uL (ref 0.0–0.2)
EOS (ABSOLUTE): 0.2 10*3/uL (ref 0.0–0.4)
Eos: 2 %
HEMATOCRIT: 47.9 % (ref 37.5–51.0)
Hemoglobin: 16.4 g/dL (ref 12.6–17.7)
Immature Grans (Abs): 0.1 10*3/uL (ref 0.0–0.1)
Immature Granulocytes: 1 %
LYMPHS ABS: 3.5 10*3/uL — AB (ref 0.7–3.1)
Lymphs: 33 %
MCH: 32.9 pg (ref 26.6–33.0)
MCHC: 34.2 g/dL (ref 31.5–35.7)
MCV: 96 fL (ref 79–97)
Monocytes Absolute: 1 10*3/uL — ABNORMAL HIGH (ref 0.1–0.9)
Monocytes: 9 %
NEUTROS ABS: 5.9 10*3/uL (ref 1.4–7.0)
NEUTROS PCT: 54 %
Platelets: 220 10*3/uL (ref 150–379)
RBC: 4.98 x10E6/uL (ref 4.14–5.80)
RDW: 12.4 % (ref 12.3–15.4)
WBC: 10.6 10*3/uL (ref 3.4–10.8)

## 2015-08-12 LAB — POCT URINALYSIS DIPSTICK
BILIRUBIN UA: NEGATIVE
GLUCOSE UA: NEGATIVE
Ketones, UA: NEGATIVE
Leukocytes, UA: NEGATIVE
NITRITE UA: NEGATIVE
PH UA: 5
Protein, UA: 30
Urobilinogen, UA: 0.2

## 2015-08-12 LAB — SEDIMENTATION RATE: Sed Rate: 2 mm/hr (ref 0–30)

## 2015-08-12 MED ORDER — HYDROCODONE-ACETAMINOPHEN 5-325 MG PO TABS: 1.0000 | ORAL_TABLET | Freq: Four times a day (QID) | ORAL | Status: DC | PRN

## 2015-08-12 NOTE — Progress Notes (Signed)
Patient: Manuel Stafford. Male    DOB: 14-Dec-1945   70 y.o.   MRN: [id number] Visit Date: 08/12/2015  Today's Provider: Mar Daring, PA-C   Chief Complaint  Patient presents with  . Hematuria   Subjective:    Hematuria Chronicity: Patient was seen yesterday by Dr. Rosanna Randy for sinusitis and  is here today  The current episode started today (Patient was called today that neede to give a urine sample , went down to the lab and gave the specimen and noticed  a lot of blood in his urine. had a low back pain on his way to the lab this afternoon. today was his last day of the amox.). He reports no clotting in his urine stream. He is experiencing no pain. He describes his urine color as dark red. Irritative symptoms do not include frequency or urgency. Obstructive symptoms include straining. Associated symptoms include flank pain. Pertinent negatives include no abdominal pain, chills, dysuria, fever, genital pain, nausea or vomiting.  No fever last night because he preemptively took tylenol before fever onset.    No Known Allergies Previous Medications   AMOXICILLIN-CLAVULANATE (AUGMENTIN) 875-125 MG TABLET    Take 1 tablet by mouth 2 (two) times daily.   ASPIRIN 81 MG TABLET    Take by mouth.   CHOLECALCIFEROL (VITAMIN D3) 2000 UNITS CAPSULE    Take 2,000 Units by mouth daily.   DIPHENHYDRAMINE (BENADRYL ALLERGY) 25 MG CAPSULE    Take by mouth.   DOXYCYCLINE (VIBRA-TABS) 100 MG TABLET    Take 1 tablet (100 mg total) by mouth 2 (two) times daily.   GLUCOSE BLOOD (ACCU-CHEK SMARTVIEW) TEST STRIP    ACCU-CHEK SMARTVIEW (In Vitro Strip)  1 (one) Strip Strip two times daily and as needed for 0 days  Quantity: 200;  Refills: 3   Ordered :15-Jun-2013  [name], MA, [name] ;  Started 15-Jun-2013 Active Comments: Medication taken as needed. Diagnosis code 250.00   HYDROCHLOROTHIAZIDE (HYDRODIURIL) 25 MG TABLET    Take 1 tablet (25 mg total) by mouth daily.   HYDROCODONE-ACETAMINOPHEN (NORCO) 10-325 MG PER TABLET    Take by mouth.   LOSARTAN (COZAAR) 100 MG TABLET    TAKE 1 TABLET BY MOUTH ONCE A DAY   MECLIZINE (ANTIVERT) 25 MG TABLET    Take 1 tablet (25 mg total) by mouth 3 (three) times daily as needed for dizziness.   METFORMIN (GLUCOPHAGE) 1000 MG TABLET    Take 1 tablet (1,000 mg total) by mouth 2 (two) times daily with a meal.   MULTIPLE VITAMIN PO    Take by mouth.   OMEPRAZOLE (PRILOSEC) 20 MG CAPSULE    Take 1 capsule (20 mg total) by mouth 2 (two) times daily before a meal.   PRAVASTATIN (PRAVACHOL) 20 MG TABLET    Take 1 tablet (20 mg total) by mouth daily.   SAW PALMETTO, SERENOA REPENS, (SAW PALMETTO BERRY) 160 MG CAPS    Take by mouth.   TIZANIDINE (ZANAFLEX) 4 MG TABLET       TRAMADOL (ULTRAM) 50 MG TABLET    Take 1 tablet (50 mg total) by mouth at bedtime.    Review of Systems  Constitutional: Negative for fever and chills.  HENT: Negative.   Respiratory: Negative.   Cardiovascular: Negative.   Gastrointestinal: Negative for nausea, vomiting, abdominal pain and rectal pain.  Genitourinary: Positive for hematuria and flank pain. Negative for dysuria, urgency, frequency, decreased urine volume, discharge, scrotal swelling,  penile pain and testicular pain.  Musculoskeletal: Positive for back pain (right side low back).  Skin: Negative.   Neurological: Negative.     Social History  Substance Use Topics  . Smoking status: Former Smoker -- 2.00 packs/day for 15 years    Types: Cigarettes    Quit date: 06/25/1974  . Smokeless tobacco: Never Used  . Alcohol Use: No   Objective:   BP 170/90 mmHg  Pulse 84  Temp(Src) 97.3 F (36.3 C) (Oral)  Resp 16  Wt 237 lb (107.502 kg)  Physical Exam  Constitutional: He is oriented to person, place, and time. He appears well-developed and well-nourished. No distress.  Cardiovascular: Normal rate, regular rhythm and normal heart sounds.  Exam reveals no gallop and no friction rub.     No murmur heard. Pulmonary/Chest: Effort normal and breath sounds normal. No respiratory distress. He has no wheezes. He has no rales.  Abdominal: Soft. Normal appearance and bowel sounds are normal. He exhibits no distension and no mass. There is no hepatosplenomegaly. There is tenderness in the right upper quadrant. There is CVA tenderness (right). There is no rebound and no guarding.  Neurological: He is alert and oriented to person, place, and time.  Skin: Skin is warm and dry. He is not diaphoretic.  Vitals reviewed.       Assessment & Plan:     1. Hematuria No known cause for hematuria.  Has had microscopic hematuria in the past and had work up with Urology.  Has never had a kidney stone. Of recent has had fevers of unknown origin that has not responded to antibiotics.  Came to give urine sample for culture at lab and was one large clot. Urine today in office was pink but no clots. CVA tenderness on right side during exam. Will send our urine for culture for unknown fevers since other sample was not usable secondary to clot. Will order CT stone study to r/o kidney stone. Patient did not want study stat.  Will give pain medication as below for back and flank pain. Push fluids. He will follow up with myself or Dr. Rosanna Randy on Monday to see if the hematuria improved or if pain improves, or to see if he may have passed a stone.  If hematuria persist may consider further work up with Urology, especially if stone study is negative. I did also advise him to go to the hospital if hematuria worsens, if he shows signs of hemodynamic instability or if he becomes unable to pass urine. He voiced understanding and agreed.   - POCT urinalysis dipstick - Urine Culture - CT RENAL STONE STUDY; Future  2. Kidney stone See above medical treatment plan. - HYDROcodone-acetaminophen (NORCO/VICODIN) 5-325 MG tablet; Take 1-2 tablets by mouth every 6 (six) hours as needed for moderate pain.  Dispense: 45 tablet;  Refill: 0 - CT RENAL STONE STUDY; Future  3. Fever, unspecified fever cause See above medical treatment plan. - Urine Culture       Mar Daring, PA-C  [facility]

## 2015-08-13 LAB — URINE CULTURE: Organism ID, Bacteria: NO GROWTH

## 2015-08-14 LAB — URINE CULTURE: ORGANISM ID, BACTERIA: NO GROWTH

## 2015-08-15 ENCOUNTER — Ambulatory Visit: Payer: PPO | Admitting: Physician Assistant

## 2015-08-15 ENCOUNTER — Ambulatory Visit (INDEPENDENT_AMBULATORY_CARE_PROVIDER_SITE_OTHER): Payer: PPO | Admitting: Family Medicine

## 2015-08-15 VITALS — BP 136/80 | HR 84 | Temp 97.7°F | Resp 16 | Wt 237.0 lb

## 2015-08-15 DIAGNOSIS — R319 Hematuria, unspecified: Secondary | ICD-10-CM

## 2015-08-15 NOTE — Patient Instructions (Signed)
Kidney Stones °Kidney stones (urolithiasis) are deposits that form inside your kidneys. The intense pain is caused by the stone moving through the urinary tract. When the stone moves, the ureter goes into spasm around the stone. The stone is usually passed in the urine.  °CAUSES  °· A disorder that makes certain neck glands produce too much parathyroid hormone (primary hyperparathyroidism). °· A buildup of uric acid crystals, similar to gout in your joints. °· Narrowing (stricture) of the ureter. °· A kidney obstruction present at birth (congenital obstruction). °· Previous surgery on the kidney or ureters. °· Numerous kidney infections. °SYMPTOMS  °· Feeling sick to your stomach (nauseous). °· Throwing up (vomiting). °· Blood in the urine (hematuria). °· Pain that usually spreads (radiates) to the groin. °· Frequency or urgency of urination. °DIAGNOSIS  °· Taking a history and physical exam. °· Blood or urine tests. °· CT scan. °· Occasionally, an examination of the inside of the urinary bladder (cystoscopy) is performed. °TREATMENT  °· Observation. °· Increasing your fluid intake. °· Extracorporeal shock wave lithotripsy--This is a noninvasive procedure that uses shock waves to break up kidney stones. °· Surgery may be needed if you have severe pain or persistent obstruction. There are various surgical procedures. Most of the procedures are performed with the use of small instruments. Only small incisions are needed to accommodate these instruments, so recovery time is minimized. °The size, location, and chemical composition are all important variables that will determine the proper choice of action for you. Talk to your health care provider to better understand your situation so that you will minimize the risk of injury to yourself and your kidney.  °HOME CARE INSTRUCTIONS  °· Drink enough water and fluids to keep your urine clear or pale yellow. This will help you to pass the stone or stone fragments. °· Strain  all urine through the provided strainer. Keep all particulate matter and stones for your health care provider to see. The stone causing the pain may be as small as a grain of salt. It is very important to use the strainer each and every time you pass your urine. The collection of your stone will allow your health care provider to analyze it and verify that a stone has actually passed. The stone analysis will often identify what you can do to reduce the incidence of recurrences. °· Only take over-the-counter or prescription medicines for pain, discomfort, or fever as directed by your health care provider. °· Keep all follow-up visits as told by your health care provider. This is important. °· Get follow-up X-rays if required. The absence of pain does not always mean that the stone has passed. It may have only stopped moving. If the urine remains completely obstructed, it can cause loss of kidney function or even complete destruction of the kidney. It is your responsibility to make sure X-rays and follow-ups are completed. Ultrasounds of the kidney can show blockages and the status of the kidney. Ultrasounds are not associated with any radiation and can be performed easily in a matter of minutes. °· Make changes to your daily diet as told by your health care provider. You may be told to: °¨ Limit the amount of salt that you eat. °¨ Eat 5 or more servings of fruits and vegetables each day. °¨ Limit the amount of meat, poultry, fish, and eggs that you eat. °· Collect a 24-hour urine sample as told by your health care provider. You may need to collect another urine sample every 6-12   months. °SEEK MEDICAL CARE IF: °· You experience pain that is progressive and unresponsive to any pain medicine you have been prescribed. °SEEK IMMEDIATE MEDICAL CARE IF:  °· Pain cannot be controlled with the prescribed medicine. °· You have a fever or shaking chills. °· The severity or intensity of pain increases over 18 hours and is not  relieved by pain medicine. °· You develop a new onset of abdominal pain. °· You feel faint or pass out. °· You are unable to urinate. °  °This information is not intended to replace advice given to you by your health care provider. Make sure you discuss any questions you have with your health care provider. °  °Document Released: 06/11/2005 Document Revised: 03/02/2015 Document Reviewed: 11/12/2012 °Elsevier Interactive Patient Education ©2016 [employer] ° °

## 2015-08-15 NOTE — Progress Notes (Signed)
Patient ID: Manuel Patella., male   DOB: August 29, 1945, 70 y.o.   MRN: [id number]   Manuel R Maryan Char.  MRN: [id number] DOB: 04/23/1946  Subjective:  HPI   1. Hematuria The patient is a 70 year old male who presents today for follow up of hematuria.  He was seen 3 days ago by Grace Bushy, PA-C.  He had a urine culture obtained that day and it has been reported as no growth.  The patient has not noticed any more blood in his urine.  He also notes that he has not had any more pain since last week except for a little in his back for a few minutes last night.  He does say that the pain last night was not in the same place (it was higher) and was not nearly as bad as what he had.   Patient Active Problem List   Diagnosis Date Noted  . Lung nodules 04/28/2015  . Allergic rhinitis 12/31/2014  . Diabetes ([place]) 12/31/2014  . Failure of erection 12/31/2014  . Fatty liver disease, nonalcoholic A999333  . Acid reflux 12/31/2014  . BP (high blood pressure) 12/31/2014  . HLD (hyperlipidemia) 12/31/2014  . Lumbosacral spondylosis without myelopathy 12/31/2014  . Adiposity 12/31/2014  . Herpes zona 12/31/2014  . Disorder of shoulder 12/31/2014  . Apnea, sleep 12/31/2014  . Avitaminosis D 12/31/2014  . DDD (degenerative disc disease), lumbar 11/30/2013    Past Medical History  Diagnosis Date  . Hypertension   . Diabetes mellitus without complication [facility])     Patient takes Metformin.    Social History   Social History  . Marital Status: Married    Spouse Name: N/A  . Number of Children: N/A  . Years of Education: N/A   Occupational History  . Not on file.   Social History Main Topics  . Smoking status: Former Smoker -- 2.00 packs/day for 15 years    Types: Cigarettes    Quit date: 06/25/1974  . Smokeless tobacco: Never Used  . Alcohol Use: No  . Drug Use: No  . Sexual Activity: Not Currently   Other Topics Concern  . Not on file   Social History Narrative     Outpatient Prescriptions Prior to Visit  Medication Sig Dispense Refill  . aspirin 81 MG tablet Take by mouth.    . Cholecalciferol (VITAMIN D3) 2000 UNITS capsule Take 2,000 Units by mouth daily.    . diphenhydrAMINE (BENADRYL ALLERGY) 25 mg capsule Take by mouth.    . doxycycline (VIBRA-TABS) 100 MG tablet Take 1 tablet (100 mg total) by mouth 2 (two) times daily. 20 tablet 0  . glucose blood (ACCU-CHEK SMARTVIEW) test strip ACCU-CHEK SMARTVIEW (In Vitro Strip)  1 (one) Strip Strip two times daily and as needed for 0 days  Quantity: 200;  Refills: 3   Ordered :15-Jun-2013  [name], MA, [name] ;  Started 15-Jun-2013 Active Comments: Medication taken as needed. Diagnosis code 250.00    . hydrochlorothiazide (HYDRODIURIL) 25 MG tablet Take 1 tablet (25 mg total) by mouth daily. 90 tablet 3  . HYDROcodone-acetaminophen (NORCO/VICODIN) 5-325 MG tablet Take 1-2 tablets by mouth every 6 (six) hours as needed for moderate pain. 45 tablet 0  . losartan (COZAAR) 100 MG tablet TAKE 1 TABLET BY MOUTH ONCE A DAY 90 tablet 3  . meclizine (ANTIVERT) 25 MG tablet Take 1 tablet (25 mg total) by mouth 3 (three) times daily as needed for dizziness. 90 tablet 2  . metFORMIN (  GLUCOPHAGE) 1000 MG tablet Take 1 tablet (1,000 mg total) by mouth 2 (two) times daily with a meal. 180 tablet 3  . MULTIPLE VITAMIN PO Take by mouth.    [name] omeprazole (PRILOSEC) 20 MG capsule Take 1 capsule (20 mg total) by mouth 2 (two) times daily before a meal. 180 capsule 3  . pravastatin (PRAVACHOL) 20 MG tablet Take 1 tablet (20 mg total) by mouth daily. 90 tablet 3  . Saw Palmetto, Serenoa repens, (SAW PALMETTO BERRY) 160 MG CAPS Take by mouth.    [name] tiZANidine (ZANAFLEX) 4 MG tablet     . traMADol (ULTRAM) 50 MG tablet Take 1 tablet (50 mg total) by mouth at bedtime. 90 tablet 1   No facility-administered medications prior to visit.    No Known Allergies  Review of Systems  Constitutional: Positive for fever  (Some on Friday, but none since Saturday) and malaise/fatigue (Up until today he had significant fatigue but, since getting up this morning he feels 70% better.). Negative for chills and diaphoresis.  Respiratory: Positive for cough (minimal) and sputum production (Little). Negative for hemoptysis, shortness of breath and wheezing.   Cardiovascular: Negative for chest pain, palpitations, orthopnea and leg swelling.  Genitourinary: Positive for urgency (chronic but unchanged). Negative for dysuria (At the end of voiding patient experiences a slight twinge, not really pain and not a spasm.), frequency, hematuria (None since Friday) and flank pain.  Neurological: Negative for dizziness, weakness and headaches.   Objective:  BP 136/80 mmHg  Pulse 84  Temp(Src) 97.7 F (36.5 C) (Oral)  Resp 16  Wt 237 lb (107.502 kg)  Physical Exam  Constitutional: He is well-developed, well-nourished, and in no distress.  HENT:  Head: Normocephalic.  Eyes: Pupils are equal, round, and reactive to light.  Neck: Normal range of motion.  Cardiovascular: Normal rate, regular rhythm and normal heart sounds.   Pulmonary/Chest: Effort normal and breath sounds normal.  Abdominal: Soft.  No CVAT  Skin: Skin is warm and dry.  Psychiatric: Mood, memory, affect and judgment normal.    Assessment and Plan :   1. Hematuria Improving, will refer to urology. I think the patient has passed the stone. Beause of recent fevers or would like to have this evaluated. - Ambulatory referral to Urology 2. Recent febrile illness May need referral to ID if this persists.  Miguel Aschoff MD [facility] 08/15/2015 11:17 AM

## 2015-08-17 ENCOUNTER — Encounter: Payer: Self-pay | Admitting: Family Medicine

## 2015-08-22 ENCOUNTER — Ambulatory Visit (INDEPENDENT_AMBULATORY_CARE_PROVIDER_SITE_OTHER): Payer: PPO | Admitting: Urology

## 2015-08-22 VITALS — Ht 72.0 in | Wt 235.2 lb

## 2015-08-22 DIAGNOSIS — I10 Essential (primary) hypertension: Secondary | ICD-10-CM | POA: Insufficient documentation

## 2015-08-22 DIAGNOSIS — R0602 Shortness of breath: Secondary | ICD-10-CM | POA: Diagnosis not present

## 2015-08-22 DIAGNOSIS — R9431 Abnormal electrocardiogram [ECG] [EKG]: Secondary | ICD-10-CM | POA: Insufficient documentation

## 2015-08-22 DIAGNOSIS — R3129 Other microscopic hematuria: Secondary | ICD-10-CM

## 2015-08-22 DIAGNOSIS — R0681 Apnea, not elsewhere classified: Secondary | ICD-10-CM | POA: Insufficient documentation

## 2015-08-22 DIAGNOSIS — E1159 Type 2 diabetes mellitus with other circulatory complications: Secondary | ICD-10-CM | POA: Insufficient documentation

## 2015-08-22 DIAGNOSIS — E782 Mixed hyperlipidemia: Secondary | ICD-10-CM | POA: Diagnosis not present

## 2015-08-22 NOTE — Progress Notes (Signed)
Follow-up with gross hematuria. Patient was evaluated for microscopic hematuria last summer. He underwent cystoscopy and a CT scan with IV contrast January 2016 both of which were benign. A 5 mm right lower pole stone was noted as well as bilateral parapelvic cysts. I reviewed all the images.   About 10 days ago the patient developed right flank pain and gross hematuria which appeared as red urine. He had no clots. His flank pain as well as the gross hematuria cleared.  He is well today but has noted some terminal mild "twinges" of discomfort but he said he's had this on and off for years.  He said to undergo a CT scan of the abdomen and pelvis noncontrast tomorrow. His UA today shows 3-10 red blood cells per high-powered field and there is no bacteria or sign of infection.  Assessment/plan: Gross hematuria-associated with right flank pain. Dr. Alben Spittle office has already arranged a CT scan. His symptoms may be related to stone passage. He undergo CT scan tomorrow and he'll follow-up with [place] to review it. He had a fairly recent benign evaluation. If symptomatology continues and the CT scan is normal remaining repeat cystoscopy.

## 2015-08-23 ENCOUNTER — Telehealth: Payer: Self-pay

## 2015-08-23 ENCOUNTER — Ambulatory Visit
Admission: RE | Admit: 2015-08-23 | Discharge: 2015-08-23 | Disposition: A | Discharge status: Home / Self Care | Payer: PPO | Source: Ambulatory Visit | Attending: Physician Assistant | Admitting: Physician Assistant

## 2015-08-23 DIAGNOSIS — K5730 Diverticulosis of large intestine without perforation or abscess without bleeding: Secondary | ICD-10-CM | POA: Diagnosis not present

## 2015-08-23 DIAGNOSIS — N200 Calculus of kidney: Secondary | ICD-10-CM | POA: Insufficient documentation

## 2015-08-23 DIAGNOSIS — R319 Hematuria, unspecified: Secondary | ICD-10-CM

## 2015-08-23 DIAGNOSIS — R918 Other nonspecific abnormal finding of lung field: Secondary | ICD-10-CM | POA: Diagnosis not present

## 2015-08-23 DIAGNOSIS — N132 Hydronephrosis with renal and ureteral calculous obstruction: Secondary | ICD-10-CM | POA: Insufficient documentation

## 2015-08-23 LAB — URINALYSIS, COMPLETE
Bilirubin, UA: NEGATIVE
GLUCOSE, UA: NEGATIVE
KETONES UA: NEGATIVE
LEUKOCYTES UA: NEGATIVE
Nitrite, UA: NEGATIVE
PROTEIN UA: NEGATIVE
SPEC GRAV UA: 1.025 (ref 1.005–1.030)
Urobilinogen, Ur: 0.2 mg/dL (ref 0.2–1.0)
pH, UA: 5 (ref 5.0–7.5)

## 2015-08-23 LAB — MICROSCOPIC EXAMINATION
Bacteria, UA: NONE SEEN
EPITHELIAL CELLS (NON RENAL): NONE SEEN /HPF (ref 0–10)

## 2015-08-23 NOTE — Telephone Encounter (Signed)
Spoke with pt in reference to KUB. Pt voiced understanding.  

## 2015-08-23 NOTE — Telephone Encounter (Signed)
-----   Message from Festus Aloe, MD sent at 08/23/2015 11:50 AM EST ----- I ordered a KUB for patient. He should have it done sometime Thursday or Friday (prior to Monday's f/u appt). Thanks.

## 2015-08-24 ENCOUNTER — Encounter: Payer: Self-pay | Admitting: Family Medicine

## 2015-08-24 ENCOUNTER — Ambulatory Visit (INDEPENDENT_AMBULATORY_CARE_PROVIDER_SITE_OTHER): Payer: PPO | Admitting: Family Medicine

## 2015-08-24 ENCOUNTER — Ambulatory Visit
Admission: RE | Admit: 2015-08-24 | Discharge: 2015-08-24 | Disposition: A | Discharge status: Home / Self Care | Payer: PPO | Source: Ambulatory Visit | Attending: Urology | Admitting: Urology

## 2015-08-24 VITALS — BP 132/68 | HR 80 | Temp 97.9°F | Resp 16 | Wt 235.0 lb

## 2015-08-24 DIAGNOSIS — N201 Calculus of ureter: Secondary | ICD-10-CM | POA: Diagnosis not present

## 2015-08-24 DIAGNOSIS — N200 Calculus of kidney: Secondary | ICD-10-CM | POA: Insufficient documentation

## 2015-08-24 DIAGNOSIS — E785 Hyperlipidemia, unspecified: Secondary | ICD-10-CM

## 2015-08-24 MED ORDER — ROSUVASTATIN CALCIUM 20 MG PO TABS: 20.0000 mg | ORAL_TABLET | Freq: Every day | ORAL | Status: DC

## 2015-08-24 NOTE — Progress Notes (Signed)
Patient ID: Manuel Patella., male   DOB: [birth year], 70 y.o.   MRN: [id number]    Subjective:  HPI Pt is following up from kidney stones. He reports that he is feeling great. His CT showed that he had a moderate hydronephrosis 7 X 9 X 10 mm non obstructing right renal calculus. He is scheduled to see urology the first of next week and they ordered a KUB for him to have prior to the office visit.   Prior to Admission medications   Medication Sig Start Date End Date Taking? Authorizing Provider  aspirin 81 MG tablet Take by mouth. 04/20/10  Yes Historical Provider, MD  Cholecalciferol (VITAMIN D3) 2000 UNITS capsule Take 2,000 Units by mouth daily.   Yes Historical Provider, MD  diphenhydrAMINE (BENADRYL ALLERGY) 25 mg capsule Take by mouth.   Yes Historical Provider, MD  doxycycline (VIBRA-TABS) 100 MG tablet Take 1 tablet (100 mg total) by mouth 2 (two) times daily. 08/10/15  Yes Hillari Zumwalt Maceo Pro., MD  glucose blood (ACCU-CHEK SMARTVIEW) test strip ACCU-CHEK SMARTVIEW (In Vitro Strip)  1 (one) Strip Strip two times daily and as needed for 0 days  Quantity: 200;  Refills: 3   Ordered :15-Jun-2013  [name], MA, [name] ;  Started 15-Jun-2013 Active Comments: Medication taken as needed. Diagnosis code 250.00 06/15/13  Yes Historical Provider, MD  hydrochlorothiazide (HYDRODIURIL) 25 MG tablet Take 1 tablet (25 mg total) by mouth daily. 08/02/15  Yes Daisie Haft Maceo Pro., MD  HYDROcodone-acetaminophen (NORCO/VICODIN) 5-325 MG tablet Take 1-2 tablets by mouth every 6 (six) hours as needed for moderate pain. 08/12/15  Yes Clearnce Sorrel Burnette, PA-C  losartan (COZAAR) 100 MG tablet TAKE 1 TABLET BY MOUTH ONCE A DAY 08/02/15  Yes [name], MD  meclizine (ANTIVERT) 25 MG tablet Take 1 tablet (25 mg total) by mouth 3 (three) times daily as needed for dizziness. 03/23/15  Yes Marciano Mundt Maceo Pro., MD  meloxicam (MOBIC) 7.5 MG tablet Take by mouth. Reported on 08/22/2015   Yes  Historical Provider, MD  metFORMIN (GLUCOPHAGE) 1000 MG tablet Take 1 tablet (1,000 mg total) by mouth 2 (two) times daily with a meal. 08/02/15  Yes [name], MD  MULTIPLE VITAMIN PO Take by mouth. 04/20/10  Yes Historical Provider, MD  omeprazole (PRILOSEC) 20 MG capsule Take 1 capsule (20 mg total) by mouth 2 (two) times daily before a meal. 08/02/15  Yes [name], MD  pravastatin (PRAVACHOL) 20 MG tablet Take 1 tablet (20 mg total) by mouth daily. 08/02/15  Yes Jersee Winiarski Maceo Pro., MD  Saw Palmetto, Serenoa repens, (SAW PALMETTO BERRY) 160 MG CAPS Take by mouth. 04/20/10  Yes Historical Provider, MD  tiZANidine (ZANAFLEX) 4 MG tablet  11/01/14  Yes Historical Provider, MD  traMADol (ULTRAM) 50 MG tablet Take 1 tablet (50 mg total) by mouth at bedtime. 08/02/15  Yes Hassan Blackshire Maceo Pro., MD    Patient Active Problem List   Diagnosis Date Noted  . Abnormal ECG 08/22/2015  . Benign essential HTN 08/22/2015  . Combined fat and carbohydrate induced hyperlipemia 08/22/2015  . Breathlessness on exertion 08/22/2015  . Lung nodules 04/28/2015  . Allergic rhinitis 12/31/2014  . Diabetes ([place]) 12/31/2014  . Failure of erection 12/31/2014  . Fatty liver disease, nonalcoholic [id number]  . Acid reflux 12/31/2014  . BP (high blood pressure) 12/31/2014  . HLD (hyperlipidemia) 12/31/2014  . Lumbosacral spondylosis without myelopathy 12/31/2014  . Adiposity 12/31/2014  .  Herpes zona 12/31/2014  . Disorder of shoulder 12/31/2014  . Apnea, sleep 12/31/2014  . Avitaminosis D 12/31/2014  . DDD (degenerative disc disease), lumbar 11/30/2013    Past Medical History  Diagnosis Date  . Hypertension   . Diabetes mellitus without complication [facility]     Patient takes Metformin.    Social History   Social History  . Marital Status: Married    Spouse Name: N/A  . Number of Children: N/A  . Years of Education: N/A   Occupational History  . Not on file.   Social History Main  Topics  . Smoking status: Former Smoker -- 2.00 packs/day for 15 years    Types: Cigarettes    Quit date: 06/25/1974  . Smokeless tobacco: Never Used  . Alcohol Use: No  . Drug Use: No  . Sexual Activity: Not Currently   Other Topics Concern  . Not on file   Social History Narrative    No Known Allergies  Review of Systems  Constitutional: Negative.   HENT: Negative.   Eyes: Negative.   Respiratory: Negative.   Cardiovascular: Negative.   Gastrointestinal: Negative.   Genitourinary: Negative.   Musculoskeletal: Positive for back pain.  Skin: Negative.   Neurological: Negative.   Endo/Heme/Allergies: Negative.   Psychiatric/Behavioral: Negative.     Immunization History  Administered Date(s) Administered  . Hepatitis A 08/13/2006  . Influenza, High Dose Seasonal PF 03/23/2015  . Pneumococcal Conjugate-13 05/25/2014  . Pneumococcal Polysaccharide-23 04/08/2012  . Tdap 10/26/2005  . Zoster 08/13/2006, 04/17/2013   Objective:  BP 132/68 mmHg  Pulse 80  Temp(Src) 97.9 F (36.6 C) (Oral)  Resp 16  Wt 235 lb (106.595 kg)  Physical Exam  Constitutional: He is oriented to person, place, and time and well-developed, well-nourished, and in no distress.  HENT:  Head: Normocephalic and atraumatic.  Right Ear: External ear normal.  Left Ear: External ear normal.  Nose: Nose normal.  Eyes: Conjunctivae are normal.  Neck: Neck supple.  Cardiovascular: Normal rate, regular rhythm and normal heart sounds.   Pulmonary/Chest: Effort normal and breath sounds normal.  Abdominal: Soft.  Neurological: He is alert and oriented to person, place, and time.  Skin: Skin is warm and dry.  Psychiatric: Mood, memory, affect and judgment normal.    Lab Results  Component Value Date   WBC 10.6 08/11/2015   HGB 16.7 07/13/2014   HCT 47.9 08/11/2015   PLT 220 08/11/2015   GLUCOSE 169* 08/11/2015   CHOL 161 02/22/2015   TRIG 168* 02/22/2015   HDL 32* 02/22/2015   LDLCALC 95  02/22/2015   TSH 2.000 02/22/2015   PSA 0.3 10/14/2014   HGBA1C 6.9 06/15/2015    CMP     Component Value Date/Time   NA 139 08/11/2015 0850   K 4.4 08/11/2015 0850   K 4.3 07/10/2012 0858   CL 97 08/11/2015 0850   CO2 23 08/11/2015 0850   GLUCOSE 169* 08/11/2015 0850   BUN 26 08/11/2015 0850   CREATININE 1.18 08/11/2015 0850   CREATININE 1.3 07/13/2014   CALCIUM 10.0 08/11/2015 0850   PROT 7.4 08/11/2015 0850   ALBUMIN 4.4 08/11/2015 0850   AST 54* 08/11/2015 0850   ALT 74* 08/11/2015 0850   ALKPHOS 99 08/11/2015 0850   BILITOT 1.1 08/11/2015 0850   GFRNONAA 62 08/11/2015 0850   GFRAA 72 08/11/2015 0850    Assessment and Plan :  1. Kidney stone On the right with hydronephrosis.   2. Hyperlipidemia  -  rosuvastatin (CRESTOR) 20 MG tablet; Take 1 tablet (20 mg total) by mouth daily.  Dispense: 30 tablet; Refill: 12 3.TII2 diabetes I have done the exam and reviewed the above chart and it is accurate to the best of my knowledge.  Miguel Aschoff MD [facility] 08/24/2015 1:49 PM

## 2015-08-29 ENCOUNTER — Encounter: Payer: Self-pay | Admitting: Urology

## 2015-08-29 ENCOUNTER — Ambulatory Visit (INDEPENDENT_AMBULATORY_CARE_PROVIDER_SITE_OTHER): Payer: PPO | Admitting: Urology

## 2015-08-29 VITALS — BP 156/83 | HR 77 | Ht 72.0 in | Wt 238.9 lb

## 2015-08-29 DIAGNOSIS — N201 Calculus of ureter: Secondary | ICD-10-CM | POA: Diagnosis not present

## 2015-08-29 DIAGNOSIS — N200 Calculus of kidney: Secondary | ICD-10-CM | POA: Insufficient documentation

## 2015-08-29 LAB — URINALYSIS, COMPLETE
BILIRUBIN UA: NEGATIVE
Glucose, UA: NEGATIVE
Ketones, UA: NEGATIVE
LEUKOCYTES UA: NEGATIVE
NITRITE UA: NEGATIVE
PROTEIN UA: NEGATIVE
Specific Gravity, UA: >1.03 — ABNORMAL HIGH (ref 1.005–1.030)
UUROB: 0.2 mg/dL (ref 0.2–1.0)
pH, UA: 5.5 (ref 5.0–7.5)

## 2015-08-29 LAB — MICROSCOPIC EXAMINATION
BACTERIA UA: NONE SEEN
Epithelial Cells (non renal): NONE SEEN /hpf (ref 0–10)

## 2015-08-29 NOTE — Progress Notes (Addendum)
Follow-up -  1 - gross hematuria - developed Feb 2017. Associated with right flank pain. Evaluated for microscopic hematuria 12/2014 he underwent cystoscopy. CT scan with IV contrast January 2016 both of which were benign. A 5 mm right lower pole stone was noted as well as bilateral parapelvic cysts. I reviewed all the images.   2-neprolithiasis - Mar 2017 - patient developed gross hematuria and right flank pain. CT scan of the abdomen and pelvis revealed a right renal stone had passed into the right proximal ureter and measured 7 x 9 mm. There was proximal hydroureteronephrosis. Skin to stone distance was was 16.5 cm, HU 550. A KUB was obtained March 2017 which visualized the stone at the right UPJ/proximal.    A/P -  Gross , MH - likely from stone disease. Prior benign eval.  Nephrolithiasis - I drew patient a picture the anatomy and we discussed the nature risks and benefits of continued surveillance, adding off label use of tamsulosin, shockwave lithotripsy, and ureteroscopy. All questions answered. He elects to proceed with right shockwave lithotripsy but will be out-of-town the next 2 shockwave lithotripsy days. We'll plan on March 23. We discussed in the long run permanent kidney damage and studies has not been shown to develop prior to 6 weeks. However, we did discuss if he develops uncontrolled pain and fevers chills nausea or vomiting to call the office or seek medical attention immediately as he may need urgent intervention such as a ureteral stent. We discussed possible decreased efficacy of shockwave due to the increased skin to stone distance, however this may be mitigated by lower density of the stone.

## 2015-08-30 ENCOUNTER — Telehealth: Payer: Self-pay | Admitting: Radiology

## 2015-08-30 NOTE — Telephone Encounter (Signed)
Notified pt's wife that he should hold ASA 81mg  3 days prior to ESWL on 09/15/15 with last dose to be taken on 09/11/15. Wife voices understanding.

## 2015-09-05 DIAGNOSIS — R9431 Abnormal electrocardiogram [ECG] [EKG]: Secondary | ICD-10-CM | POA: Diagnosis not present

## 2015-09-05 DIAGNOSIS — R0602 Shortness of breath: Secondary | ICD-10-CM | POA: Diagnosis not present

## 2015-09-06 ENCOUNTER — Ambulatory Visit (INDEPENDENT_AMBULATORY_CARE_PROVIDER_SITE_OTHER): Payer: PPO | Admitting: Family Medicine

## 2015-09-06 ENCOUNTER — Encounter: Payer: Self-pay | Admitting: Family Medicine

## 2015-09-06 VITALS — BP 132/76 | HR 89 | Temp 97.4°F | Resp 16 | Wt 236.0 lb

## 2015-09-06 DIAGNOSIS — K645 Perianal venous thrombosis: Secondary | ICD-10-CM

## 2015-09-06 MED ORDER — HYDROCORTISONE ACE-PRAMOXINE 2.5-1 % RE CREA: 1.0000 "application " | TOPICAL_CREAM | Freq: Three times a day (TID) | RECTAL | Status: DC

## 2015-09-06 NOTE — Progress Notes (Signed)
Subjective:     Patient ID: Manuel Patella., male   DOB: 1946-03-08, 70 y.o.   MRN: [id number]  HPI  Chief Complaint  Patient presents with  . Hemorrhoids    Patient comes in office today with complaint of an external hemorrhoid that appeared on Sunday 03/12. Patient states that he was doing strenous activity while helping push a trailer, patient reports that hemorrhoid is very hard and the size of his pinky finger.   States he does not strain with bowel movements. Has been using Preparation H.   Review of Systems     Objective:   Physical Exam  Constitutional: He appears well-developed and well-nourished. No distress.  Genitourinary:  Moderately sized tense external hemorrhoid       Assessment:    1. External hemorrhoid, thrombosed - hydrocortisone-pramoxine (ANALPRAM HC) 2.5-1 % rectal cream; Place 1 application rectally 3 (three) times daily.  Dispense: 30 g; Refill: 0    Plan:    Discussed conservative treatment with bathtub soaks and hemorrhoidal preparations. He will return for I & D if not improving over the next 2-3 days.

## 2015-09-06 NOTE — Patient Instructions (Signed)
Discussed use of bathtub soaks for 5-10 minutes daily and application of hemorrhoidal cream. If stools hard to pass use a lubricant suppository.

## 2015-09-07 ENCOUNTER — Encounter: Payer: Self-pay | Admitting: Family Medicine

## 2015-09-07 ENCOUNTER — Ambulatory Visit (INDEPENDENT_AMBULATORY_CARE_PROVIDER_SITE_OTHER): Payer: PPO | Admitting: Family Medicine

## 2015-09-07 VITALS — BP 142/62 | HR 82 | Temp 97.5°F | Resp 18

## 2015-09-07 VITALS — BP 122/72 | HR 74 | Temp 97.7°F | Resp 17 | Wt 237.8 lb

## 2015-09-07 DIAGNOSIS — K645 Perianal venous thrombosis: Secondary | ICD-10-CM | POA: Diagnosis not present

## 2015-09-07 MED ORDER — HYDROCODONE-ACETAMINOPHEN 10-325 MG PO TABS: 1.0000 | ORAL_TABLET | ORAL | Status: DC | PRN

## 2015-09-07 NOTE — Patient Instructions (Signed)
Discussed use of sanitary pads, bathtub soaks and astringent pads.

## 2015-09-07 NOTE — Progress Notes (Signed)
Subjective:     Patient ID: Manuel Stafford., male   DOB: 12-Nov-1945, 70 y.o.   MRN: [id number]  HPI  Chief Complaint  Patient presents with  . Hemorrhoids    Patient returns back to office today to address external hemorrhoid,patient was seen in the office yesterday 09/06/15 and prescribed Hydrocortison-Pramoxine 2.5-1% cream. Patient reports that he did not pick up prescription and would like to proceed today with I&D.     Review of Systems     Objective:   Physical Exam  Constitutional: He appears well-developed and well-nourished. No distress.  Genitourinary:  Moderate sized tense external hemorrhoid Procedure Note: Cleansed with Betadine and infiltrated with lidocaine with epinephrine. Fish mouth incision with spontaneous delivery of clot.       Assessment:    1. External hemorrhoid, thrombosed - Incise external hemorrhoid    Plan:    Discussed post-incision treatment.

## 2015-09-07 NOTE — Progress Notes (Signed)
Patient ID: Manuel Stafford., male   DOB: January 19, 1946, 70 y.o.   MRN: [id number]    Subjective:  HPI Pt is here following up from a procedure he had this morning. He had a hemorrhoid I and D by another provider. He has not stopped bleeding since he left this morning. The procedure gave him relief but the blood has not stopped.   Prior to Admission medications   Medication Sig Start Date End Date Taking? Authorizing Provider  aspirin 81 MG tablet Take by mouth. 04/20/10  Yes Historical Provider, MD  Cholecalciferol (VITAMIN D3) 2000 UNITS capsule Take 2,000 Units by mouth daily.   Yes Historical Provider, MD  diphenhydrAMINE (BENADRYL ALLERGY) 25 mg capsule Take by mouth.   Yes Historical Provider, MD  glucose blood (ACCU-CHEK SMARTVIEW) test strip ACCU-CHEK SMARTVIEW (In Vitro Strip)  1 (one) Strip Strip two times daily and as needed for 0 days  Quantity: 200;  Refills: 3   Ordered :15-Jun-2013  [name], MA, [name] ;  Started 15-Jun-2013 Active Comments: Medication taken as needed. Diagnosis code 250.00 06/15/13  Yes Historical Provider, MD  hydrochlorothiazide (HYDRODIURIL) 25 MG tablet Take 1 tablet (25 mg total) by mouth daily. 08/02/15  Yes Richard Maceo Pro., MD  HYDROcodone-acetaminophen (NORCO/VICODIN) 5-325 MG tablet Take 1-2 tablets by mouth every 6 (six) hours as needed for moderate pain. 08/12/15  Yes Clearnce Sorrel Burnette, PA-C  hydrocortisone-pramoxine (ANALPRAM HC) 2.5-1 % rectal cream Place 1 application rectally 3 (three) times daily. 09/06/15  Yes Carmon Ginsberg, PA  losartan (COZAAR) 100 MG tablet TAKE 1 TABLET BY MOUTH ONCE A DAY 08/02/15  Yes [name], MD  metFORMIN (GLUCOPHAGE) 1000 MG tablet Take 1 tablet (1,000 mg total) by mouth 2 (two) times daily with a meal. 08/02/15  Yes [name], MD  MULTIPLE VITAMIN PO Take by mouth. 04/20/10  Yes Historical Provider, MD  omeprazole (PRILOSEC) 20 MG capsule Take 1 capsule (20 mg total) by mouth 2  (two) times daily before a meal. 08/02/15  Yes [name], MD  pravastatin (PRAVACHOL) 20 MG tablet Take 1 tablet (20 mg total) by mouth daily. 08/02/15  Yes Richard Maceo Pro., MD  rosuvastatin (CRESTOR) 20 MG tablet Take 1 tablet (20 mg total) by mouth daily. 08/24/15  Yes Richard Maceo Pro., MD  Saw Palmetto, Serenoa repens, (SAW PALMETTO BERRY) 160 MG CAPS Take by mouth. 04/20/10  Yes Historical Provider, MD  tiZANidine (ZANAFLEX) 4 MG tablet Reported on 08/29/2015 11/01/14  Yes Historical Provider, MD  traMADol (ULTRAM) 50 MG tablet Take 1 tablet (50 mg total) by mouth at bedtime. 08/02/15  Yes Richard Maceo Pro., MD    Patient Active Problem List   Diagnosis Date Noted  . Nephrolithiasis 08/29/2015  . Right ureteral stone 08/29/2015  . Abnormal ECG 08/22/2015  . Benign essential HTN 08/22/2015  . Combined fat and carbohydrate induced hyperlipemia 08/22/2015  . Breathlessness on exertion 08/22/2015  . Lung nodules 04/28/2015  . Allergic rhinitis 12/31/2014  . Diabetes ([place]) 12/31/2014  . Failure of erection 12/31/2014  . Fatty liver disease, nonalcoholic A999333  . Acid reflux 12/31/2014  . BP (high blood pressure) 12/31/2014  . HLD (hyperlipidemia) 12/31/2014  . Lumbosacral spondylosis without myelopathy 12/31/2014  . Adiposity 12/31/2014  . Herpes zona 12/31/2014  . Disorder of shoulder 12/31/2014  . Apnea, sleep 12/31/2014  . Avitaminosis D 12/31/2014  . DDD (degenerative disc disease), lumbar 11/30/2013    Past Medical  History  Diagnosis Date  . Hypertension   . Diabetes mellitus without complication [facility])     Patient takes Metformin.    Social History   Social History  . Marital Status: Married    Spouse Name: N/A  . Number of Children: N/A  . Years of Education: N/A   Occupational History  . Not on file.   Social History Main Topics  . Smoking status: Former Smoker -- 2.00 packs/day for 15 years    Types: Cigarettes    Quit date: 06/25/1974    . Smokeless tobacco: Never Used  . Alcohol Use: No  . Drug Use: No  . Sexual Activity: Not Currently   Other Topics Concern  . Not on file   Social History Narrative    No Known Allergies  Review of Systems  Constitutional: Negative.   HENT: Negative.   Eyes: Negative.   Respiratory: Negative.   Cardiovascular: Negative.   Gastrointestinal: Negative.   Genitourinary: Negative.   Musculoskeletal: Negative.   Skin: Negative.   Neurological: Negative.   Endo/Heme/Allergies: Negative.   Psychiatric/Behavioral: Negative.     Immunization History  Administered Date(s) Administered  . Hepatitis A 08/13/2006  . Influenza, High Dose Seasonal PF 03/23/2015  . Pneumococcal Conjugate-13 05/25/2014  . Pneumococcal Polysaccharide-23 04/08/2012  . Tdap 10/26/2005  . Zoster 08/13/2006, 04/17/2013   Objective:  BP 142/62 mmHg  Pulse 82  Temp(Src) 97.5 F (36.4 C) (Oral)  Resp 18  Physical Exam  Constitutional: He is well-developed, well-nourished, and in no distress.  HENT:  Head: Normocephalic.  Eyes: Conjunctivae are normal.  Cardiovascular: Normal rate.   Pulmonary/Chest: Effort normal and breath sounds normal.  Abdominal: Soft.  Genitourinary:  Previously incised thrombosed external hemorrhoid has again clotted. Lot is removed from the vessel after cleaning with iodine. Hemorrhoid is intact with benign gauze. Areas then packed with 4 x 4's.  Skin: Skin is warm and dry.  Psychiatric: Mood, memory, affect and judgment normal.    Lab Results  Component Value Date   WBC 10.6 08/11/2015   HGB 16.7 07/13/2014   HCT 47.9 08/11/2015   PLT 220 08/11/2015   GLUCOSE 169* 08/11/2015   CHOL 161 02/22/2015   TRIG 168* 02/22/2015   HDL 32* 02/22/2015   LDLCALC 95 02/22/2015   TSH 2.000 02/22/2015   PSA 0.3 10/14/2014   HGBA1C 6.9 06/15/2015    CMP     Component Value Date/Time   NA 139 08/11/2015 0850   K 4.4 08/11/2015 0850   K 4.3 07/10/2012 0858   CL 97  08/11/2015 0850   CO2 23 08/11/2015 0850   GLUCOSE 169* 08/11/2015 0850   BUN 26 08/11/2015 0850   CREATININE 1.18 08/11/2015 0850   CREATININE 1.3 07/13/2014   CALCIUM 10.0 08/11/2015 0850   PROT 7.4 08/11/2015 0850   ALBUMIN 4.4 08/11/2015 0850   AST 54* 08/11/2015 0850   ALT 74* 08/11/2015 0850   ALKPHOS 99 08/11/2015 0850   BILITOT 1.1 08/11/2015 0850   GFRNONAA 62 08/11/2015 0850   GFRAA 72 08/11/2015 0850    Assessment and Plan :  1. External hemorrhoid, thrombosed See physical exam  Pain is treated. He will return of this gives him any more problems. Hemorrhoid might need need to be opened up a little bit more as it is about 4- 5 millimeters cut from his procedure. The incision may need to be extended just a bit. He will follow-up that this continues to bother him. He is advised  of the bleeding become significantly go straight to the emergency department if not during office hours. - HYDROcodone-acetaminophen (NORCO) 10-325 MG tablet; Take 1-2 tablets by mouth every 4 (four) hours as needed.  Dispense: 100 tablet; Refill: [place] [facility] 09/07/2015 4:22 PM

## 2015-09-09 ENCOUNTER — Encounter: Payer: Self-pay | Admitting: Family Medicine

## 2015-09-09 ENCOUNTER — Ambulatory Visit (INDEPENDENT_AMBULATORY_CARE_PROVIDER_SITE_OTHER): Payer: PPO | Admitting: Family Medicine

## 2015-09-09 VITALS — BP 138/62 | HR 88 | Temp 98.0°F | Resp 16

## 2015-09-09 DIAGNOSIS — K645 Perianal venous thrombosis: Secondary | ICD-10-CM

## 2015-09-09 NOTE — Patient Instructions (Signed)
May start warm sitz baths 3-4 times a day to help with swelling. Instructed to come into the office tomorrow morning if not improving.

## 2015-09-13 DIAGNOSIS — I10 Essential (primary) hypertension: Secondary | ICD-10-CM | POA: Diagnosis not present

## 2015-09-13 DIAGNOSIS — I119 Hypertensive heart disease without heart failure: Secondary | ICD-10-CM | POA: Insufficient documentation

## 2015-09-13 DIAGNOSIS — E782 Mixed hyperlipidemia: Secondary | ICD-10-CM | POA: Diagnosis not present

## 2015-09-14 NOTE — Pre-Procedure Instructions (Signed)
Plan  -Proceed to surgery and/or invasive procedure without restriction to pre or post operative and/or procedural care. The patient is at lowest risk possible for cardiovascular complications with surgical intervention and/or invasive procedure. Currently has no evidence active and/or significant angina and/or congestive heart failure. The patient may discontinue aspirin 7 days prior to procedure and restart at a safe period thereafter -The patient has LVH without significant signs or symptoms requiring change in medication at this time. They have had instruction for continuation of appropriate medication for hypertension control which will help with prevention of diastolic dysfunction congestive heart failure, shortness of breath, tachycardia, and or chest pain. We additionally have discussed diet and exercise including the DASH diet. -we have discussed risk reduction in cardiovascular disease process by a continuation of lipid management with current medication management for lipid reduction. The goals continue to be 30-50% lowering of LDL cholesterol in addition of lifestyle measures. The patient has an understanding of this discussion at this time and we will continue the appropriate strategy. -There has been a review of medication management for hypertension control and of the current medical regimen used. The patient understands all the risks and benefits of hypertension control to reduce possible future cardiovascular complications and risk of disease. There will be no changes in medication regimen necessary today  No orders of the defined types were placed in this encounter.  Return in about 6 months (around 03/15/2016).  Flossie Dibble, MD

## 2015-09-14 NOTE — Pre-Procedure Instructions (Signed)
Stress Test Regular Stress was performed showing Normal test.  Preoperative Assessment Profile for Lithotripsy Risk factors for cardiac complication with surgery: Positive for: none Negative for: Diabetes, Cardiomyopathy or chf, Coronary artery disease, Chronic Kidney disease and Peripheral vascular disease Active cardiovascular conditions: Positive [id number] Negative for: Angina or anginal equivalent, Recent infartion, Congestive heart failure symptoms, Dysrhythmia and Symptomatic valve disease Functional capacity >4 Mets Risk of surgery and/or procedure low risk Possible need and adjustments in therapy prior to surgery no Overall risk of cardiac complication with surgery and/or procedure is low, <1%   DOPPLER ECHO and OTHER SPECIAL PROCEDURES Aortic:No AR No AS 142.0 cm/sec peak vel 8.1 mmHg peak grad  Mitral:MILD MR No MS 3.4 cm^2 by DOPPLER MV Inflow E Vel=55.8 cm/sec MV Annulus E'Vel=5.6 cm/sec E/E'Ratio=10.0  Tricuspid:MILD TR No TS 219.5 cm/sec peak TR vel 24.3 mmHg peak RV pressure  Pulmonary:MILD PR No PS 88.5 cm/sec peak vel 3.1 mmHg peak grad   INTERPRETATION NORMAL LEFT VENTRICULAR SYSTOLIC FUNCTION WITH MODERATE LVH MILD VALVULAR REGURGITATION (See above) NO VALVULAR STENOSIS EF 55%

## 2015-09-15 ENCOUNTER — Encounter
Admission: RE | Disposition: A | Discharge status: Home / Self Care | Payer: Self-pay | Source: Ambulatory Visit | Attending: Urology

## 2015-09-15 ENCOUNTER — Encounter: Payer: Self-pay | Admitting: *Deleted

## 2015-09-15 ENCOUNTER — Ambulatory Visit
Admission: RE | Admit: 2015-09-15 | Discharge: 2015-09-15 | Disposition: A | Discharge status: Home / Self Care | Payer: PPO | Source: Ambulatory Visit | Attending: Urology | Admitting: Urology

## 2015-09-15 ENCOUNTER — Ambulatory Visit: Payer: PPO

## 2015-09-15 DIAGNOSIS — N200 Calculus of kidney: Secondary | ICD-10-CM | POA: Diagnosis not present

## 2015-09-15 DIAGNOSIS — N201 Calculus of ureter: Secondary | ICD-10-CM | POA: Diagnosis not present

## 2015-09-15 DIAGNOSIS — Z01818 Encounter for other preprocedural examination: Secondary | ICD-10-CM | POA: Diagnosis not present

## 2015-09-15 HISTORY — PX: EXTRACORPOREAL SHOCK WAVE LITHOTRIPSY: SHX1557

## 2015-09-15 LAB — GLUCOSE, CAPILLARY: GLUCOSE-CAPILLARY: 129 mg/dL — AB (ref 65–99)

## 2015-09-15 SURGERY — LITHOTRIPSY, ESWL
Anesthesia: Moderate Sedation | Laterality: Right

## 2015-09-15 MED ORDER — DIPHENHYDRAMINE HCL 25 MG PO CAPS
ORAL_CAPSULE | ORAL | Status: DC
Start: 2015-09-15 — End: 2015-09-15
  Filled 2015-09-15: qty 1

## 2015-09-15 MED ORDER — DIAZEPAM 5 MG PO TABS
ORAL_TABLET | ORAL | Status: AC
  Filled 2015-09-15: qty 2

## 2015-09-15 MED ORDER — DIAZEPAM 5 MG PO TABS
10.0000 mg | ORAL_TABLET | ORAL | Status: AC
  Administered 2015-09-15: 10 mg via ORAL

## 2015-09-15 MED ORDER — CIPROFLOXACIN HCL 500 MG PO TABS
ORAL_TABLET | ORAL | Status: AC
  Filled 2015-09-15: qty 1

## 2015-09-15 MED ORDER — CIPROFLOXACIN HCL 500 MG PO TABS
500.0000 mg | ORAL_TABLET | Freq: Once | ORAL | Status: AC
  Administered 2015-09-15: 500 mg via ORAL

## 2015-09-15 MED ORDER — DIPHENHYDRAMINE HCL 25 MG PO CAPS
25.0000 mg | ORAL_CAPSULE | ORAL | Status: AC
  Administered 2015-09-15: 25 mg via ORAL

## 2015-09-15 MED ORDER — DEXTROSE-NACL 5-0.45 % IV SOLN
INTRAVENOUS | Status: DC
  Administered 2015-09-15: 12:00:00 via INTRAVENOUS

## 2015-09-15 NOTE — Discharge Instructions (Signed)
AMBULATORY SURGERY  °DISCHARGE INSTRUCTIONS ° ° °1) The drugs that you were given will stay in your system until tomorrow so for the next 24 hours you should not: ° °A) Drive an automobile °B) Make any legal decisions °C) Drink any alcoholic beverage ° ° °2) You may resume regular meals tomorrow.  Today it is better to start with liquids and gradually work up to solid foods. ° °You may eat anything you prefer, but it is better to start with liquids, then soup and crackers, and gradually work up to solid foods. ° ° °3) Please notify your doctor immediately if you have any unusual bleeding, trouble breathing, redness and pain at the surgery site, drainage, fever, or pain not relieved by medication. ° °4) Your post-operative visit with Dr.                     °           °     is: Date:                        Time:   ° °Please call to schedule your post-operative visit. ° °5) Additional Instructions: °6)  °

## 2015-09-16 ENCOUNTER — Telehealth: Payer: Self-pay

## 2015-09-16 ENCOUNTER — Ambulatory Visit (INDEPENDENT_AMBULATORY_CARE_PROVIDER_SITE_OTHER): Payer: PPO | Admitting: *Deleted

## 2015-09-16 DIAGNOSIS — N200 Calculus of kidney: Secondary | ICD-10-CM

## 2015-09-16 MED ORDER — KETOROLAC TROMETHAMINE 60 MG/2ML IM SOLN
60.0000 mg | Freq: Once | INTRAMUSCULAR | Status: AC
  Administered 2015-09-16: 60 mg via INTRAMUSCULAR

## 2015-09-16 NOTE — Progress Notes (Signed)
Patient came in for Toradol injection for pain from kidney stone. Per Dr. Pilar Jarvis (see telephone encounter from [facility]) give patient 15 mg of Toradol. 0.5cc (15mg )  Toradol given in Right upper outer quadrant. 1.5 ml waste. 60 mg one dose vial. Lot #[id number] EXP:01/May/2018.

## 2015-09-16 NOTE — Telephone Encounter (Signed)
Pt wife called back stating pt is going to try and tough out the pain as he just doesn't feel like coming into the office. Reinforced with wife that should he have a problem over the weekend he will have to go to the ER. Wife voiced understanding.

## 2015-09-16 NOTE — Telephone Encounter (Signed)
Pt called c/o severe uncontrolled pain causing n/v. Pt denied f/c or dysuria. Pt stated that he has had a heating pad on his back, gotten in a hot bath, and taken pain meds without any relief. Per Dr. Pilar Jarvis pt should come in for an injection of Tordol 15mg . Pt stated that at this point he feels so bad he doesn't even want to come in. Reinforced with pt this is the strongest medication we have in office and he will have to go to the ER for further relief. Pt stated that he will call back around 1:30 to let us know how he is feeling. Reinforced with pt should he get any worse to go to the ER/call 911. Pt voiced understanding of whole conversation.

## 2015-09-18 ENCOUNTER — Emergency Department: Payer: PPO | Admitting: Anesthesiology

## 2015-09-18 ENCOUNTER — Observation Stay
Admission: EM | Admit: 2015-09-18 | Discharge: 2015-09-19 | Disposition: A | Discharge status: Home / Self Care | Payer: PPO | Attending: Urology | Admitting: Urology

## 2015-09-18 ENCOUNTER — Emergency Department: Payer: PPO

## 2015-09-18 ENCOUNTER — Encounter
Admission: EM | Disposition: A | Discharge status: Home / Self Care | Payer: Self-pay | Source: Home / Self Care | Attending: Emergency Medicine

## 2015-09-18 ENCOUNTER — Encounter: Payer: Self-pay | Admitting: Emergency Medicine

## 2015-09-18 DIAGNOSIS — K5900 Constipation, unspecified: Secondary | ICD-10-CM | POA: Diagnosis not present

## 2015-09-18 DIAGNOSIS — N179 Acute kidney failure, unspecified: Secondary | ICD-10-CM | POA: Diagnosis not present

## 2015-09-18 DIAGNOSIS — R1031 Right lower quadrant pain: Secondary | ICD-10-CM | POA: Insufficient documentation

## 2015-09-18 DIAGNOSIS — K760 Fatty (change of) liver, not elsewhere classified: Secondary | ICD-10-CM | POA: Insufficient documentation

## 2015-09-18 DIAGNOSIS — N132 Hydronephrosis with renal and ureteral calculous obstruction: Principal | ICD-10-CM | POA: Insufficient documentation

## 2015-09-18 DIAGNOSIS — E7801 Familial hypercholesterolemia: Secondary | ICD-10-CM | POA: Insufficient documentation

## 2015-09-18 DIAGNOSIS — E669 Obesity, unspecified: Secondary | ICD-10-CM | POA: Insufficient documentation

## 2015-09-18 DIAGNOSIS — M47817 Spondylosis without myelopathy or radiculopathy, lumbosacral region: Secondary | ICD-10-CM | POA: Diagnosis not present

## 2015-09-18 DIAGNOSIS — R509 Fever, unspecified: Secondary | ICD-10-CM | POA: Insufficient documentation

## 2015-09-18 DIAGNOSIS — E782 Mixed hyperlipidemia: Secondary | ICD-10-CM | POA: Insufficient documentation

## 2015-09-18 DIAGNOSIS — Z833 Family history of diabetes mellitus: Secondary | ICD-10-CM | POA: Insufficient documentation

## 2015-09-18 DIAGNOSIS — E119 Type 2 diabetes mellitus without complications: Secondary | ICD-10-CM | POA: Insufficient documentation

## 2015-09-18 DIAGNOSIS — Z7984 Long term (current) use of oral hypoglycemic drugs: Secondary | ICD-10-CM | POA: Diagnosis not present

## 2015-09-18 DIAGNOSIS — R918 Other nonspecific abnormal finding of lung field: Secondary | ICD-10-CM | POA: Insufficient documentation

## 2015-09-18 DIAGNOSIS — Z6831 Body mass index (BMI) 31.0-31.9, adult: Secondary | ICD-10-CM | POA: Insufficient documentation

## 2015-09-18 DIAGNOSIS — N201 Calculus of ureter: Secondary | ICD-10-CM | POA: Diagnosis not present

## 2015-09-18 DIAGNOSIS — Z79899 Other long term (current) drug therapy: Secondary | ICD-10-CM | POA: Diagnosis not present

## 2015-09-18 DIAGNOSIS — D72829 Elevated white blood cell count, unspecified: Secondary | ICD-10-CM | POA: Insufficient documentation

## 2015-09-18 DIAGNOSIS — N289 Disorder of kidney and ureter, unspecified: Secondary | ICD-10-CM | POA: Diagnosis not present

## 2015-09-18 DIAGNOSIS — K219 Gastro-esophageal reflux disease without esophagitis: Secondary | ICD-10-CM | POA: Insufficient documentation

## 2015-09-18 DIAGNOSIS — M5136 Other intervertebral disc degeneration, lumbar region: Secondary | ICD-10-CM | POA: Insufficient documentation

## 2015-09-18 DIAGNOSIS — Z8249 Family history of ischemic heart disease and other diseases of the circulatory system: Secondary | ICD-10-CM | POA: Insufficient documentation

## 2015-09-18 DIAGNOSIS — M479 Spondylosis, unspecified: Secondary | ICD-10-CM | POA: Diagnosis not present

## 2015-09-18 DIAGNOSIS — R109 Unspecified abdominal pain: Secondary | ICD-10-CM | POA: Diagnosis not present

## 2015-09-18 DIAGNOSIS — B029 Zoster without complications: Secondary | ICD-10-CM | POA: Insufficient documentation

## 2015-09-18 DIAGNOSIS — Z7982 Long term (current) use of aspirin: Secondary | ICD-10-CM | POA: Diagnosis not present

## 2015-09-18 DIAGNOSIS — G4730 Sleep apnea, unspecified: Secondary | ICD-10-CM | POA: Diagnosis not present

## 2015-09-18 DIAGNOSIS — R0609 Other forms of dyspnea: Secondary | ICD-10-CM | POA: Diagnosis not present

## 2015-09-18 DIAGNOSIS — I10 Essential (primary) hypertension: Secondary | ICD-10-CM | POA: Insufficient documentation

## 2015-09-18 DIAGNOSIS — J309 Allergic rhinitis, unspecified: Secondary | ICD-10-CM | POA: Insufficient documentation

## 2015-09-18 DIAGNOSIS — N23 Unspecified renal colic: Secondary | ICD-10-CM | POA: Diagnosis not present

## 2015-09-18 DIAGNOSIS — E559 Vitamin D deficiency, unspecified: Secondary | ICD-10-CM | POA: Diagnosis not present

## 2015-09-18 DIAGNOSIS — Z87891 Personal history of nicotine dependence: Secondary | ICD-10-CM | POA: Diagnosis not present

## 2015-09-18 HISTORY — PX: CYSTOSCOPY WITH STENT PLACEMENT: SHX5790

## 2015-09-18 LAB — CBC
HEMATOCRIT: 44 % (ref 40.0–52.0)
HEMOGLOBIN: 15.5 g/dL (ref 13.0–18.0)
MCH: 33 pg (ref 26.0–34.0)
MCHC: 35.1 g/dL (ref 32.0–36.0)
MCV: 93.9 fL (ref 80.0–100.0)
Platelets: 188 10*3/uL (ref 150–440)
RBC: 4.69 MIL/uL (ref 4.40–5.90)
RDW: 12.7 % (ref 11.5–14.5)
WBC: 17.3 10*3/uL — ABNORMAL HIGH (ref 3.8–10.6)

## 2015-09-18 LAB — GLUCOSE, CAPILLARY: GLUCOSE-CAPILLARY: 140 mg/dL — AB (ref 65–99)

## 2015-09-18 LAB — URINALYSIS COMPLETE WITH MICROSCOPIC (ARMC ONLY)
BACTERIA UA: NONE SEEN
Bilirubin Urine: NEGATIVE
Glucose, UA: NEGATIVE mg/dL
Nitrite: NEGATIVE
PH: 5 (ref 5.0–8.0)
PROTEIN: 30 mg/dL — AB
Specific Gravity, Urine: 1.019 (ref 1.005–1.030)

## 2015-09-18 LAB — COMPREHENSIVE METABOLIC PANEL
ALBUMIN: 4 g/dL (ref 3.5–5.0)
ALK PHOS: 94 U/L (ref 38–126)
ALT: 30 U/L (ref 17–63)
ANION GAP: 11 (ref 5–15)
AST: 23 U/L (ref 15–41)
BUN: 20 mg/dL (ref 6–20)
CALCIUM: 9.5 mg/dL (ref 8.9–10.3)
CHLORIDE: 96 mmol/L — AB (ref 101–111)
CO2: 22 mmol/L (ref 22–32)
Creatinine, Ser: 1.9 mg/dL — ABNORMAL HIGH (ref 0.61–1.24)
GFR calc non Af Amer: 34 mL/min — ABNORMAL LOW (ref >60)
GFR, EST AFRICAN AMERICAN: 39 mL/min — AB (ref >60)
GLUCOSE: 204 mg/dL — AB (ref 65–99)
POTASSIUM: 3.4 mmol/L — AB (ref 3.5–5.1)
SODIUM: 129 mmol/L — AB (ref 135–145)
Total Bilirubin: 1.4 mg/dL — ABNORMAL HIGH (ref 0.3–1.2)
Total Protein: 7.9 g/dL (ref 6.5–8.1)

## 2015-09-18 SURGERY — CYSTOSCOPY, WITH STENT INSERTION
Anesthesia: General | Laterality: Right | Wound class: Clean Contaminated

## 2015-09-18 MED ORDER — OXYBUTYNIN CHLORIDE 5 MG PO TABS
5.0000 mg | ORAL_TABLET | Freq: Three times a day (TID) | ORAL | Status: DC | PRN
  Administered 2015-09-19: 5 mg via ORAL
  Filled 2015-09-18: qty 1

## 2015-09-18 MED ORDER — PANTOPRAZOLE SODIUM 40 MG PO TBEC
40.0000 mg | DELAYED_RELEASE_TABLET | Freq: Every day | ORAL | Status: DC
  Administered 2015-09-19: 40 mg via ORAL
  Filled 2015-09-18: qty 1

## 2015-09-18 MED ORDER — PROPOFOL 10 MG/ML IV BOLUS
INTRAVENOUS | Status: DC | PRN
  Administered 2015-09-18: 160 mg via INTRAVENOUS

## 2015-09-18 MED ORDER — LIDOCAINE HCL (CARDIAC) 20 MG/ML IV SOLN
INTRAVENOUS | Status: AC
  Filled 2015-09-18: qty 5

## 2015-09-18 MED ORDER — LACTATED RINGERS IV SOLN
INTRAVENOUS | Status: DC | PRN
  Administered 2015-09-18: 20:00:00 via INTRAVENOUS

## 2015-09-18 MED ORDER — BELLADONNA ALKALOIDS-OPIUM 16.2-60 MG RE SUPP
1.0000 | Freq: Four times a day (QID) | RECTAL | Status: DC | PRN
Start: 2015-09-18 — End: 2015-09-19

## 2015-09-18 MED ORDER — ONDANSETRON HCL 4 MG/2ML IJ SOLN: 4.0000 mg | Freq: Once | INTRAMUSCULAR | Status: DC | PRN

## 2015-09-18 MED ORDER — SODIUM CHLORIDE 0.9 % IV SOLN
INTRAVENOUS | Status: DC
  Administered 2015-09-18 – 2015-09-19 (×2): via INTRAVENOUS

## 2015-09-18 MED ORDER — INSULIN ASPART 100 UNIT/ML ~~LOC~~ SOLN
0.0000 [IU] | Freq: Every day | SUBCUTANEOUS | Status: DC
  Filled 2015-09-18: qty 2

## 2015-09-18 MED ORDER — HYDROMORPHONE HCL 1 MG/ML IJ SOLN
1.0000 mg | Freq: Once | INTRAMUSCULAR | Status: AC
  Administered 2015-09-18: 1 mg via INTRAVENOUS
  Filled 2015-09-18: qty 1

## 2015-09-18 MED ORDER — ONDANSETRON HCL 4 MG/2ML IJ SOLN
4.0000 mg | Freq: Once | INTRAMUSCULAR | Status: AC
  Administered 2015-09-18: 4 mg via INTRAVENOUS
  Filled 2015-09-18: qty 2

## 2015-09-18 MED ORDER — DEXTROSE 5 % IV SOLN
1.0000 g | Freq: Once | INTRAVENOUS | Status: AC
  Administered 2015-09-18: 1 g via INTRAVENOUS
  Filled 2015-09-18: qty 10

## 2015-09-18 MED ORDER — DIPHENHYDRAMINE HCL 50 MG/ML IJ SOLN: 12.5000 mg | Freq: Four times a day (QID) | INTRAMUSCULAR | Status: DC | PRN

## 2015-09-18 MED ORDER — FENTANYL CITRATE (PF) 100 MCG/2ML IJ SOLN
25.0000 ug | INTRAMUSCULAR | Status: DC | PRN
  Administered 2015-09-18 (×4): 25 ug via INTRAVENOUS

## 2015-09-18 MED ORDER — PRAVASTATIN SODIUM 20 MG PO TABS
20.0000 mg | ORAL_TABLET | Freq: Every day | ORAL | Status: DC
Start: 2015-09-19 — End: 2015-09-19
  Administered 2015-09-19: 20 mg via ORAL
  Filled 2015-09-18: qty 1

## 2015-09-18 MED ORDER — CETYLPYRIDINIUM CHLORIDE 0.05 % MT LIQD: 7.0000 mL | Freq: Two times a day (BID) | OROMUCOSAL | Status: DC

## 2015-09-18 MED ORDER — HEPARIN SODIUM (PORCINE) 5000 UNIT/ML IJ SOLN
5000.0000 [IU] | Freq: Three times a day (TID) | INTRAMUSCULAR | Status: DC
  Administered 2015-09-18 – 2015-09-19 (×2): 5000 [IU] via SUBCUTANEOUS
  Filled 2015-09-18 (×2): qty 1

## 2015-09-18 MED ORDER — OXYCODONE-ACETAMINOPHEN 5-325 MG PO TABS
1.0000 | ORAL_TABLET | ORAL | Status: DC | PRN
  Administered 2015-09-19: 2 via ORAL
  Filled 2015-09-18: qty 2

## 2015-09-18 MED ORDER — LIDOCAINE HCL (CARDIAC) 20 MG/ML IV SOLN
INTRAVENOUS | Status: DC | PRN
  Administered 2015-09-18: 60 mg via INTRAVENOUS

## 2015-09-18 MED ORDER — BISACODYL 10 MG RE SUPP: 10.0000 mg | Freq: Every day | RECTAL | Status: DC | PRN

## 2015-09-18 MED ORDER — INSULIN ASPART 100 UNIT/ML ~~LOC~~ SOLN
4.0000 [IU] | Freq: Three times a day (TID) | SUBCUTANEOUS | Status: DC
  Administered 2015-09-19: 4 [IU] via SUBCUTANEOUS
  Filled 2015-09-18: qty 4

## 2015-09-18 MED ORDER — ROSUVASTATIN CALCIUM 10 MG PO TABS: 20.0000 mg | ORAL_TABLET | Freq: Every day | ORAL | Status: DC

## 2015-09-18 MED ORDER — LIDOCAINE HCL (CARDIAC) 20 MG/ML IV SOLN
1.5000 mg/kg | Freq: Once | INTRAVENOUS | Status: AC
  Administered 2015-09-18: 156.4 mg via INTRAVENOUS
  Filled 2015-09-18: qty 10

## 2015-09-18 MED ORDER — DIPHENHYDRAMINE HCL 12.5 MG/5ML PO ELIX: 12.5000 mg | ORAL_SOLUTION | Freq: Four times a day (QID) | ORAL | Status: DC | PRN

## 2015-09-18 MED ORDER — ONDANSETRON HCL 4 MG/2ML IJ SOLN
4.0000 mg | INTRAMUSCULAR | Status: DC | PRN
Start: 2015-09-18 — End: 2015-09-19

## 2015-09-18 MED ORDER — DEXTROSE 5 % IV SOLN
1.0000 g | INTRAVENOUS | Status: DC
  Filled 2015-09-18: qty 10

## 2015-09-18 MED ORDER — INSULIN ASPART 100 UNIT/ML ~~LOC~~ SOLN
0.0000 [IU] | Freq: Three times a day (TID) | SUBCUTANEOUS | Status: DC
  Administered 2015-09-19: 2 [IU] via SUBCUTANEOUS

## 2015-09-18 MED ORDER — FENTANYL CITRATE (PF) 100 MCG/2ML IJ SOLN
INTRAMUSCULAR | Status: AC
  Administered 2015-09-18: 25 ug via INTRAVENOUS
  Filled 2015-09-18: qty 2

## 2015-09-18 MED ORDER — MIDAZOLAM HCL 2 MG/2ML IJ SOLN
INTRAMUSCULAR | Status: DC | PRN
  Administered 2015-09-18: 2 mg via INTRAVENOUS

## 2015-09-18 MED ORDER — ASPIRIN 81 MG PO CHEW
81.0000 mg | CHEWABLE_TABLET | Freq: Every day | ORAL | Status: DC
  Administered 2015-09-19: 81 mg via ORAL
  Filled 2015-09-18: qty 1

## 2015-09-18 MED ORDER — DOCUSATE SODIUM 100 MG PO CAPS
100.0000 mg | ORAL_CAPSULE | Freq: Two times a day (BID) | ORAL | Status: DC
  Administered 2015-09-18 – 2015-09-19 (×2): 100 mg via ORAL
  Filled 2015-09-18 (×2): qty 1

## 2015-09-18 MED ORDER — ACETAMINOPHEN 325 MG PO TABS: 650.0000 mg | ORAL_TABLET | ORAL | Status: DC | PRN

## 2015-09-18 MED ORDER — MORPHINE SULFATE (PF) 2 MG/ML IV SOLN
2.0000 mg | INTRAVENOUS | Status: DC | PRN
  Administered 2015-09-19: 2 mg via INTRAVENOUS
  Filled 2015-09-18: qty 1

## 2015-09-18 SURGICAL SUPPLY — 22 items
BAG DRAIN CYSTO-URO LG1000N (MISCELLANEOUS) ×2 IMPLANT
CATH FOL 2WAY LX 16X5 (CATHETERS) ×2 IMPLANT
CATH URETL 5X70 OPEN END (CATHETERS) ×2 IMPLANT
CONRAY 43 FOR UROLOGY 50M (MISCELLANEOUS) IMPLANT
GLOVE BIO SURGEON STRL SZ 6.5 (GLOVE) ×6 IMPLANT
GOWN STRL REUS W/ TWL LRG LVL4 (GOWN DISPOSABLE) ×2 IMPLANT
GOWN STRL REUS W/TWL LRG LVL4 (GOWN DISPOSABLE) ×4
HOLDER FOLEY CATH W/STRAP (MISCELLANEOUS) ×2 IMPLANT
KIT RM TURNOVER CYSTO AR (KITS) ×2 IMPLANT
PACK CYSTO AR (MISCELLANEOUS) ×2 IMPLANT
PREP PVP WINGED SPONGE (MISCELLANEOUS) ×2 IMPLANT
SENSORWIRE 0.038 NOT ANGLED (WIRE) ×2
SET CYSTO W/LG BORE CLAMP LF (SET/KITS/TRAYS/PACK) ×2 IMPLANT
SOL .9 NS 3000ML IRR  AL (IV SOLUTION) ×1
SOL .9 NS 3000ML IRR AL (IV SOLUTION) ×1
SOL .9 NS 3000ML IRR UROMATIC (IV SOLUTION) ×1 IMPLANT
STENT URET 6FRX24 CONTOUR (STENTS) IMPLANT
STENT URET 6FRX26 CONTOUR (STENTS) ×2 IMPLANT
SURGILUBE 2OZ TUBE FLIPTOP (MISCELLANEOUS) ×2 IMPLANT
SYRINGE IRR TOOMEY STRL 70CC (SYRINGE) ×2 IMPLANT
WATER STERILE IRR 1000ML POUR (IV SOLUTION) ×2 IMPLANT
WIRE SENSOR 0.038 NOT ANGLED (WIRE) ×1 IMPLANT

## 2015-09-18 NOTE — Progress Notes (Signed)
Hemorrhoid continues to be painful but the blood status subsided almost completely. On exam he is no formation or swelling, the doses in the hemorrhoid has reaccumulated but is only mildly tender. At this time will eat with spasms and 8 with hydrocodone. I think he is had no blood loss 2 need any blood work at this time. Discussed that if he got worse or fever felt bad to go directly to emergency department.

## 2015-09-18 NOTE — ED Notes (Signed)
Has had nausea and dry heaves but no vomiting.

## 2015-09-18 NOTE — Progress Notes (Signed)
Antibiotic renal dosing note  Patient is not currently on any antibiotics that require renal adjustment.  Pharmacy will follow on.  Sim Boast, PharmD, BCPS  09/18/2015

## 2015-09-18 NOTE — H&P (Addendum)
Urology Consult  I have been asked to see the patient by Dr. Marcelene Butte, for evaluation and management of right ureteral stones.  Chief Complaint: Right flank pain  History of Present Illness: Manuel Stafford. is a 70 y.o. year old who underwent ESWL on Thursday, 09/15/2015 for a 9 x 7 mm right proximal ureteral stone. Following the procedure, he developed severe right flank pain with low-grade temps to 99. He did receive 1 injection of Toradol at our office on Friday which helped significantly with his pain quickly returned. In the ER, he has evidence of steinstrasse with an 8 mm right mid ureteral stone, and 13 mm of stone debris within the distal ureter with significant hydroureteronephrosis. He also has a leukocytosis to 17, +UA with TNTC WBC/ RBC, and low-grade temp to 99 with low-grade tachycardia but normotensive.  He also has evidence of acute kidney injury with elevated creatinine to 1.9  He states that since his ESWL, he's been unable to tolerate much by mouth due to pain. He's not really eaten since Thursday. He last drank at noon today.  Past Medical History  Diagnosis Date  . Hypertension   . Diabetes mellitus without complication [facility])     Patient takes Metformin.    Past Surgical History  Procedure Laterality Date  . Hernia repair    . Rotator cuff repair Right 06/2012  . Extracorporeal shock wave lithotripsy Right 09/15/2015    Procedure: EXTRACORPOREAL SHOCK WAVE LITHOTRIPSY (ESWL);  Surgeon: Nickie Retort, MD;  Location: [facility] ORS;  Service: Urology;  Laterality: Right;    Home Medications:    Medication List    ASK your doctor about these medications        ACCU-CHEK SMARTVIEW test strip  Generic drug:  glucose blood  ACCU-CHEK SMARTVIEW (In Vitro Strip)  1 (one) Strip Strip two times daily and as needed for 0 days  Quantity: 200;  Refills: 3   Ordered :15-Jun-2013  [name], MA, [name] ;  Started 15-Jun-2013 Active Comments: Medication taken as  needed. Diagnosis code 250.00     aspirin 81 MG tablet  Take by mouth.     BENADRYL ALLERGY 25 mg capsule  Generic drug:  diphenhydrAMINE  Take by mouth.     hydrochlorothiazide 25 MG tablet  Commonly known as:  HYDRODIURIL  Take 1 tablet (25 mg total) by mouth daily.     HYDROcodone-acetaminophen 5-325 MG tablet  Commonly known as:  NORCO/VICODIN  Take 1-2 tablets by mouth every 6 (six) hours as needed for moderate pain.     HYDROcodone-acetaminophen 10-325 MG tablet  Commonly known as:  NORCO  Take 1-2 tablets by mouth every 4 (four) hours as needed.     hydrocortisone-pramoxine 2.5-1 % rectal cream  Commonly known as:  ANALPRAM HC  Place 1 application rectally 3 (three) times daily.     losartan 100 MG tablet  Commonly known as:  COZAAR  TAKE 1 TABLET BY MOUTH ONCE A DAY     metFORMIN 1000 MG tablet  Commonly known as:  GLUCOPHAGE  Take 1 tablet (1,000 mg total) by mouth 2 (two) times daily with a meal.     MULTIPLE VITAMIN PO  Take by mouth.     omeprazole 20 MG capsule  Commonly known as:  PRILOSEC  Take 1 capsule (20 mg total) by mouth 2 (two) times daily before a meal.     pravastatin 20 MG tablet  Commonly known as:  PRAVACHOL  Take  1 tablet (20 mg total) by mouth daily.     rosuvastatin 20 MG tablet  Commonly known as:  CRESTOR  Take 1 tablet (20 mg total) by mouth daily.     Saw Palmetto Berry 160 MG Caps  Take by mouth.     tiZANidine 4 MG tablet  Commonly known as:  ZANAFLEX  Reported on 08/29/2015     traMADol 50 MG tablet  Commonly known as:  ULTRAM  Take 1 tablet (50 mg total) by mouth at bedtime.     Vitamin D3 2000 units capsule  Take 2,000 Units by mouth daily.        Allergies: No Known Allergies  Family History  Problem Relation Age of Onset  . Hypertension Mother   . Diabetes Father   . CAD Father   . Diabetes Sister   . Heart disease Sister   . CAD Sister     Social History:  reports that he quit smoking about 41 years  ago. His smoking use included Cigarettes. He has a 30 pack-year smoking history. He has never used smokeless tobacco. He reports that he does not drink alcohol or use illicit drugs.  ROS: A complete review of systems was performed.  All systems are negative except for pertinent findings as noted.  Physical Exam:  Vital signs in last 24 hours: Temp:  [97.6 F (36.4 C)] 97.6 F (36.4 C) (03/26 1251) Pulse Rate:  [95-124] 99 (03/26 1830) Resp:  [14-22] 20 (03/26 1830) BP: (114-138)/(68-82) 114/72 mmHg (03/26 1830) SpO2:  [89 %-95 %] 91 % (03/26 1830) Weight:  [230 lb (104.327 kg)] 230 lb (104.327 kg) (03/26 1251) Constitutional:  Alert and oriented, No acute distress HEENT: [place] AT, moist mucus membranes.  Trachea midline, no masses Cardiovascular: Regular rate and rhythm, no clubbing, cyanosis, or edema. Respiratory: Normal respiratory effort, lungs clear bilaterally GI: Abdomen is soft, nontender, nondistended, no abdominal masses GU: + L CVA tenderness Skin: No rashes, bruises or suspicious lesions Lymph: No cervical or inguinal adenopathy Neurologic: Grossly intact, no focal deficits, moving all 4 extremities Psychiatric: Normal mood and affect   Laboratory Data:   Recent Labs  09/18/15 1254  WBC 17.3*  HGB 15.5  HCT 44.0    Recent Labs  09/18/15 1254  NA 129*  K 3.4*  CL 96*  CO2 22  GLUCOSE 204*  BUN 20  CREATININE 1.90*  CALCIUM 9.5   Results for Manuel Stafford, Manuel Stafford (MRN [id number]) as of 09/18/2015 21:08  Ref. Range 09/18/2015 12:54  Appearance Latest Ref Range: CLEAR  HAZY (A)  Bacteria, UA Latest Ref Range: NONE SEEN  NONE SEEN  Bilirubin Urine Latest Ref Range: NEGATIVE  NEGATIVE  Color, Urine Latest Ref Range: YELLOW  YELLOW (A)  Glucose Latest Ref Range: NEGATIVE mg/dL NEGATIVE  Hgb urine dipstick Latest Ref Range: NEGATIVE  3+ (A)  Ketones, ur Latest Ref Range: NEGATIVE mg/dL 1+ (A)  Leukocytes, UA Latest Ref Range: NEGATIVE  2+ (A)  Mucous Unknown  PRESENT  Nitrite Latest Ref Range: NEGATIVE  NEGATIVE  pH Latest Ref Range: 5.0-8.0  5.0  Protein Latest Ref Range: NEGATIVE mg/dL 30 (A)  RBC / HPF Latest Ref Range: 0-5 RBC/hpf TOO NUMEROUS TO C...  Specific Gravity, Urine Latest Ref Range: 1.005-1.030  1.019  Squamous Epithelial / LPF Latest Ref Range: NONE SEEN  0-5 (A)  WBC, UA Latest Ref Range: 0-5 WBC/hpf TOO NUMEROUS TO C...    Radiologic Imaging: Ct Renal Stone Study  09/18/2015  CLINICAL DATA:  Right flank/right lower quadrant pain, low-grade fever, status post lithotripsy EXAM: CT ABDOMEN AND PELVIS WITHOUT CONTRAST TECHNIQUE: Multidetector CT imaging of the abdomen and pelvis was performed following the standard protocol without IV contrast. COMPARISON:  CT abdomen pelvis dated 08/23/2015 FINDINGS: Lower chest:  Lung bases are essentially clear. Hepatobiliary: Mild hepatic steatosis. Gallbladder is unremarkable. No intrahepatic or extrahepatic ductal dilatation. Pancreas: Within normal limits. Spleen: Within normal limits. Adrenals/Urinary Tract: Adrenal glands are within normal limits. Left kidney is notable for a small left lower pole renal sinus cyst (series 2/ image 42). No hydronephrosis. Right kidney is unremarkable.  Moderate right hydroureteronephrosis. 8 mm calculus in the mid right ureter (series 5/ image 82). Additional 14 mm elongated calculus (versus stacked smaller calculi) in the distal right ureter at the UVJ (series 5/ image 112). Mild anterior bladder wall thickening. Stomach/Bowel: Stomach is within normal limits. No evidence of bowel obstruction. Normal appendix (series 2/ image 63). Sigmoid diverticulosis, without evidence of diverticulitis Vascular/Lymphatic: Atherosclerotic calcifications of the abdominal aorta and branch vessels. No evidence of abdominal aortic aneurysm. Calcified lymph nodes in the porta hepatis and portacaval region (series 2/ images 24 and 27), within normal limits. No suspicious abdominopelvic  lymphadenopathy. Reproductive: Prostate is grossly unremarkable. Other: Nonspecific stranding/fluid along the right posterior pararenal and retroperitoneal spaces as well as the right pelvis (series 2/images 51, 65, and 86), related to recent lithotripsy. Musculoskeletal: Degenerative changes of the visualized thoracolumbar spine. IMPRESSION: Postprocedural changes/fluid related to recent right lithotripsy. 8 mm calculus in the mid right ureter. 14 mm new elongated calculus in the distal right ureter at the UVJ. Associated moderate right hydroureteronephrosis. Mild hepatic steatosis. Electronically Signed   By: Julian Hy M.D.   On: 09/18/2015 15:46   CT scan personally reviewed and with the patient.    Impression/Assessment:  70 year old male with obstructing right ureteral stone in setting of possible infection (+UA,  WBC 17, temps 99, tachycardia) along with AKI and severe pain.  As such, I counseled him to undergo right ureteral stent placement and the stones will address this on a later date. We discussed the risks and benefits of the procedure in detail.    Plan:  -Consent obtained -Patient site marked -All of his questions and concerns were answered. -Admit following procedure, will trend labs/ continue IV abx  09/18/2015, 7:51 PM  Hollice Espy,  MD

## 2015-09-18 NOTE — ED Provider Notes (Signed)
Time Seen: Approximately 1620  I have reviewed the triage notes  Chief Complaint: Abdominal Pain and Constipation   History of Present Illness: Manuel Stafford. is a 70 y.o. male who presents after a recent lithotripsy with some right-sided upper and lower quadrant abdominal pain. He states he received a lithotripsy a couple of days ago and felt that he was constipated. He states he has not a bowel movement since that time. Patient's noted to have a low-grade fever at home approximately 99. He has been taken some over-the-counter medications and or to have a bowel movement. Patient describes pain up and down on the right side with some mild right flank discomfort. Denies any pleuritic or obvious positional component. He states he is still passing gas and denies any preexistent melena or hematochezia. He states he's noticed some blood intermittently in his urine which she was told to expect.   Past Medical History  Diagnosis Date  . Hypertension   . Diabetes mellitus without complication [facility])     Patient takes Metformin.    Patient Active Problem List   Diagnosis Date Noted  . Nephrolithiasis 08/29/2015  . Right ureteral stone 08/29/2015  . Abnormal ECG 08/22/2015  . Benign essential HTN 08/22/2015  . Combined fat and carbohydrate induced hyperlipemia 08/22/2015  . Breathlessness on exertion 08/22/2015  . Lung nodules 04/28/2015  . Allergic rhinitis 12/31/2014  . Diabetes ([place]) 12/31/2014  . Failure of erection 12/31/2014  . Fatty liver disease, nonalcoholic [id number]  . Acid reflux 12/31/2014  . BP (high blood pressure) 12/31/2014  . HLD (hyperlipidemia) 12/31/2014  . Lumbosacral spondylosis without myelopathy 12/31/2014  . Adiposity 12/31/2014  . Herpes zona 12/31/2014  . Disorder of shoulder 12/31/2014  . Apnea, sleep 12/31/2014  . Avitaminosis D 12/31/2014  . DDD (degenerative disc disease), lumbar 11/30/2013    Past Surgical History  Procedure Laterality Date   . Hernia repair    . Rotator cuff repair Right 06/2012  . Extracorporeal shock wave lithotripsy Right 09/15/2015    Procedure: EXTRACORPOREAL SHOCK WAVE LITHOTRIPSY (ESWL);  Surgeon: Nickie Retort, MD;  Location: [facility] ORS;  Service: Urology;  Laterality: Right;    Past Surgical History  Procedure Laterality Date  . Hernia repair    . Rotator cuff repair Right 06/2012  . Extracorporeal shock wave lithotripsy Right 09/15/2015    Procedure: EXTRACORPOREAL SHOCK WAVE LITHOTRIPSY (ESWL);  Surgeon: Nickie Retort, MD;  Location: [facility] ORS;  Service: Urology;  Laterality: Right;    Current Outpatient Rx  Name  Route  Sig  Dispense  Refill  . aspirin 81 MG tablet   Oral   Take by mouth.         . Cholecalciferol (VITAMIN D3) 2000 UNITS capsule   Oral   Take 2,000 Units by mouth daily.         . diphenhydrAMINE (BENADRYL ALLERGY) 25 mg capsule   Oral   Take by mouth.         [name] glucose blood (ACCU-CHEK SMARTVIEW) test strip      ACCU-CHEK SMARTVIEW (In Vitro Strip)  1 (one) Strip Strip two times daily and as needed for 0 days  Quantity: 200;  Refills: 3   Ordered :15-Jun-2013  [name], MA, [name] ;  Started 15-Jun-2013 Active Comments: Medication taken as needed. Diagnosis code 250.00         . hydrochlorothiazide (HYDRODIURIL) 25 MG tablet   Oral   Take 1 tablet (25 mg total) by mouth daily.  90 tablet   3   . HYDROcodone-acetaminophen (NORCO) 10-325 MG tablet   Oral   Take 1-2 tablets by mouth every 4 (four) hours as needed.   100 tablet   0   . HYDROcodone-acetaminophen (NORCO/VICODIN) 5-325 MG tablet   Oral   Take 1-2 tablets by mouth every 6 (six) hours as needed for moderate pain.   45 tablet   0   . hydrocortisone-pramoxine (ANALPRAM HC) 2.5-1 % rectal cream   Rectal   Place 1 application rectally 3 (three) times daily.   30 g   0   . losartan (COZAAR) 100 MG tablet      TAKE 1 TABLET BY MOUTH ONCE A DAY   90 tablet   3   .  metFORMIN (GLUCOPHAGE) 1000 MG tablet   Oral   Take 1 tablet (1,000 mg total) by mouth 2 (two) times daily with a meal.   180 tablet   3   . MULTIPLE VITAMIN PO   Oral   Take by mouth.         [name] omeprazole (PRILOSEC) 20 MG capsule   Oral   Take 1 capsule (20 mg total) by mouth 2 (two) times daily before a meal.   180 capsule   3   . pravastatin (PRAVACHOL) 20 MG tablet   Oral   Take 1 tablet (20 mg total) by mouth daily.   90 tablet   3   . rosuvastatin (CRESTOR) 20 MG tablet   Oral   Take 1 tablet (20 mg total) by mouth daily.   30 tablet   12   . Saw Palmetto, Serenoa repens, (SAW PALMETTO BERRY) 160 MG CAPS   Oral   Take by mouth.         [name] tiZANidine (ZANAFLEX) 4 MG tablet      Reported on 08/29/2015         . traMADol (ULTRAM) 50 MG tablet   Oral   Take 1 tablet (50 mg total) by mouth at bedtime.   90 tablet   1     Allergies:  Review of patient's allergies indicates no known allergies.  Family History: Family History  Problem Relation Age of Onset  . Hypertension Mother   . Diabetes Father   . CAD Father   . Diabetes Sister   . Heart disease Sister   . CAD Sister     Social History: Social History  Substance Use Topics  . Smoking status: Former Smoker -- 2.00 packs/day for 15 years    Types: Cigarettes    Quit date: 06/25/1974  . Smokeless tobacco: Never Used  . Alcohol Use: No     Review of Systems:   10 point review of systems was performed and was otherwise negative:  Constitutional: No fever Eyes: No visual disturbances ENT: No sore throat, ear pain Cardiac: No chest pain Respiratory: No shortness of breath, wheezing, or stridor Abdomen: Abdominal pain up or lower. No active vomiting Endocrine: No weight loss, No night sweats Extremities: No peripheral edema, cyanosis Skin: No rashes, easy bruising Neurologic: No focal weakness, trouble with speech or swollowing Urologic: No dysuria, Mild Hematuria, or urinary  frequency   Physical Exam:  ED Triage Vitals  Enc Vitals Group     BP 09/18/15 1251 137/75 mmHg     Pulse Rate 09/18/15 1251 124     Resp 09/18/15 1251 22     Temp 09/18/15 1251 97.6 F (36.4 C)  Temp Source 09/18/15 1251 Oral     SpO2 09/18/15 1251 94 %     Weight 09/18/15 1251 230 lb (104.327 kg)     Height 09/18/15 1251 6' (1.829 m)     Head Cir --      Peak Flow --      Pain Score 09/18/15 1252 10     Pain Loc --      Pain Edu? --      Excl. in [place]? --     General: Awake , Alert , and Oriented times 3; GCS 15 Head: Normal cephalic , atraumatic Eyes: Pupils equal , round, reactive to light Nose/Throat: No nasal drainage, patent upper airway without erythema or exudate.  Neck: Supple, Full range of motion, No anterior adenopathy or palpable thyroid masses Lungs: Clear to ascultation without wheezes , rhonchi, or rales Heart: Regular rate, regular rhythm without murmurs , gallops , or rubs Abdomen: Tenderness in the right upper and lower abdominal region without rebound, guarding , or rigidity; bowel sounds positive and symmetric in all 4 quadrants. No organomegaly .        Extremities: 2 plus symmetric pulses. No edema, clubbing or cyanosis Neurologic: normal ambulation, Motor symmetric without deficits, sensory intact Skin: warm, dry, no rashes   Labs:   All laboratory work was reviewed including any pertinent negatives or positives listed below:  Labs Reviewed  COMPREHENSIVE METABOLIC PANEL - Abnormal; Notable for the following:    Sodium 129 (*)    Potassium 3.4 (*)    Chloride 96 (*)    Glucose, Bld 204 (*)    Creatinine, Ser 1.90 (*)    Total Bilirubin 1.4 (*)    GFR calc non Af Amer 34 (*)    GFR calc Af Amer 39 (*)    All other components within normal limits  CBC - Abnormal; Notable for the following:    WBC 17.3 (*)    All other components within normal limits  URINALYSIS COMPLETEWITH MICROSCOPIC ([facility] ONLY) - Abnormal; Notable for the following:     Color, Urine YELLOW (*)    APPearance HAZY (*)    Ketones, ur 1+ (*)    Hgb urine dipstick 3+ (*)    Protein, ur 30 (*)    Leukocytes, UA 2+ (*)    Squamous Epithelial / LPF 0-5 (*)    All other components within normal limits  URINE CULTURE  Review laboratory work shows findings of some new renal insufficiency with an increase in his creatinine 1.90. Urine as expected has numerous white and red blood cells with no bacteria. Urine culture is pending  Radiology:  EXAM: CT ABDOMEN AND PELVIS WITHOUT CONTRAST  TECHNIQUE: Multidetector CT imaging of the abdomen and pelvis was performed following the standard protocol without IV contrast.  COMPARISON: CT abdomen pelvis dated 08/23/2015  FINDINGS: Lower chest: Lung bases are essentially clear.  Hepatobiliary: Mild hepatic steatosis.  Gallbladder is unremarkable. No intrahepatic or extrahepatic ductal dilatation.  Pancreas: Within normal limits.  Spleen: Within normal limits.  Adrenals/Urinary Tract: Adrenal glands are within normal limits.  Left kidney is notable for a small left lower pole renal sinus cyst (series 2/ image 42). No hydronephrosis.  Right kidney is unremarkable. Moderate right hydroureteronephrosis.  8 mm calculus in the mid right ureter (series 5/ image 82).  Additional 14 mm elongated calculus (versus stacked smaller calculi) in the distal right ureter at the UVJ (series 5/ image 112).  Mild anterior bladder wall thickening.  Stomach/Bowel:  Stomach is within normal limits.  No evidence of bowel obstruction.  Normal appendix (series 2/ image 63).  Sigmoid diverticulosis, without evidence of diverticulitis  Vascular/Lymphatic: Atherosclerotic calcifications of the abdominal aorta and branch vessels. No evidence of abdominal aortic aneurysm.  Calcified lymph nodes in the porta hepatis and portacaval region (series 2/ images 24 and 27), within normal limits. No suspicious abdominopelvic  lymphadenopathy.  Reproductive: Prostate is grossly unremarkable.  Other: Nonspecific stranding/fluid along the right posterior pararenal and retroperitoneal spaces as well as the right pelvis (series 2/images 51, 65, and 86), related to recent lithotripsy.  Musculoskeletal: Degenerative changes of the visualized thoracolumbar spine.  IMPRESSION: Postprocedural changes/fluid related to recent right lithotripsy.  8 mm calculus in the mid right ureter. 14 mm new elongated calculus in the distal right ureter at the UVJ. Associated moderate right hydroureteronephrosis.  Mild hepatic steatosis.   Electronically Signed By: Julian Hy M.D. On: 09/18/2015 15:46         I personally reviewed the radiologic studies   ED Course:  Patient's stay here was uneventful. I did not want to given any nonsteroidal medication due to his increasing creatinine. He appears to have stacked stones at the right UVJ region with a still remaining large stone mid ureter. Patient was given some IV Rocephin because of his elevated white blood count and the possibility of a fever at home with them but blood cells in his urine. The patient was given Dilaudid and IV along with Zofran. He was also given lidocaine 1.5 mg/kg IV bolus which seemed to eliminate the vast majority of his discomfort. Patient's case was recorded reviewed with the on-call urologist who is agreed to see and evaluate the patient and determine disposition.    Assessment: * Renal colic Recent lithotripsy Mild renal insufficiency   Final Clinical Impression:   Final diagnoses:  Right lower quadrant abdominal pain  Right-sided abdominal pain of unknown cause     Plan:  Urologic evaluation           Daymon Larsen, MD 09/18/15 1907

## 2015-09-18 NOTE — Op Note (Signed)
Date of procedure: 09/18/2015  Preoperative diagnosis:  1. Right obstructing ureteral stone   Postoperative diagnosis:  1. Same as above   Procedure: 1. Cystoscopy 2. Right ureteral stent placement  Surgeon: Hollice Espy, MD  Anesthesia: General  Complications: None  Intraoperative findings: Small stone debris in bladder with stone material emanating from right UO.Marland KitchenLonell Face reflux from right UO upon stent placement concerning for upper tract infection.  EBL: minimal   Specimens: none  Drains: 6 x 26 French double-J right ureteral stent  Indication: Manuel Stafford. is a 70 y.o. patient with multiple right obstructing fragments following ESWL. He presented to the emergency room with low-grade temps, positive UA, leukocytosis, AK I and severe pain.  After reviewing the management options for treatment, he elected to proceed with the above surgical procedure(s) emergently. We have discussed the potential benefits and risks of the procedure, side effects of the proposed treatment, the likelihood of the patient achieving the goals of the procedure, and any potential problems that might occur during the procedure or recuperation. Informed consent has been obtained.  Description of procedure:  The patient was taken to the operating room and general anesthesia was induced.  The patient was placed in the dorsal lithotomy position, prepped and draped in the usual sterile fashion, and preoperative antibiotics were previously administered in the ED. A preoperative time-out was performed.   At this point in time, a 91 French rigid cystoscope was advanced per urethra into the bladder. Within the bladder there were a few tiny calcifications consistent with stone debris.  There was a small stone fragment emanating from the right UO. At this point time, an open-ended ureteral catheter was used to guide a sensor wire up to level of the kidney confirmed under fluoroscopic guidance. This passed  easily. A 6 x 26 French double-J ureteral stent was then advanced over the wire up to level of the kidney. The wire was partially drawn until coil was noted within the renal pelvis. The wire was then fully withdrawn and a full coil was noted within the bladder. Upon placement of the stent, there was efflux of very dark, purulent material.  The bladder was then drained and the scope was removed. A 16 French Foley catheter was then advanced per urethra into the bladder and the balloon was filled 10 cc of sterile water. The patient was then cleaned and dried, repositioned supine position, reversed anesthesia. He was taken to the PACU in stable condition.  Plan: Patient will be admitted for IV antibiotics and observation. We will recheck labs in the morning.    Hollice Espy, M.D.

## 2015-09-18 NOTE — Anesthesia Preprocedure Evaluation (Addendum)
Anesthesia Evaluation  Patient identified by MRN, date of birth, ID band Patient awake    Reviewed: Allergy & Precautions, NPO status , Patient's Chart, lab work & pertinent test results  Airway Mallampati: II       Dental  (+) Teeth Intact   Pulmonary sleep apnea , former smoker,    breath sounds clear to auscultation       Cardiovascular Exercise Tolerance: Good hypertension, Pt. on medications  Rhythm:Regular Rate:Normal     Neuro/Psych    GI/Hepatic Neg liver ROS, GERD  ,  Endo/Other  diabetes, Well Controlled, Type 2, Oral Hypoglycemic Agents  Renal/GU Renal disease     Musculoskeletal   Abdominal   Peds  Hematology   Anesthesia Other Findings   Reproductive/Obstetrics                            Anesthesia Physical Anesthesia Plan  ASA: II and emergent  Anesthesia Plan: General   Post-op Pain Management:    Induction: Intravenous  Airway Management Planned: LMA  Additional Equipment:   Intra-op Plan:   Post-operative Plan: Extubation in OR  Informed Consent: I have reviewed the patients History and Physical, chart, labs and discussed the procedure including the risks, benefits and alternatives for the proposed anesthesia with the patient or authorized representative who has indicated his/her understanding and acceptance.     Plan Discussed with: CRNA  Anesthesia Plan Comments:         Anesthesia Quick Evaluation

## 2015-09-18 NOTE — Anesthesia Postprocedure Evaluation (Signed)
Anesthesia Post Note  Patient: Manuel Stafford.  Procedure(s) Performed: Procedure(s) (LRB): CYSTOSCOPY WITH STENT PLACEMENT (Right)  Patient location during evaluation: PACU Anesthesia Type: General Level of consciousness: awake Pain management: satisfactory to patient Vital Signs Assessment: post-procedure vital signs reviewed and stable Respiratory status: nonlabored ventilation Cardiovascular status: stable Anesthetic complications: no    Last Vitals:  Filed Vitals:   09/18/15 2043 09/18/15 2044  BP: 123/80 123/78  Pulse: 97 99  Temp: 36.9 C 36.6 C  Resp: 21 21    Last Pain:  Filed Vitals:   09/18/15 2047  PainSc: Asleep                 VAN STAVEREN,Satoya Feeley

## 2015-09-18 NOTE — ED Notes (Signed)
Pt had lithotripsy earlier this week for right side kidney stone.  Had right flank pain and now that is resolved but c/o severe RLQ pain and low grade fever of 99.  Has taken OTC meds to have bowel movement.  Last BM was Thursday.

## 2015-09-18 NOTE — Anesthesia Procedure Notes (Signed)
Procedure Name: LMA Insertion Date/Time: 09/18/2015 8:15 PM Performed by: [id number], Congetta Odriscoll Pre-anesthesia Checklist: Timeout performed, Patient being monitored, Suction available, Emergency Drugs available and Patient identified Patient Re-evaluated:Patient Re-evaluated prior to inductionPreoxygenation: Pre-oxygenation with 100% oxygen Intubation Type: IV induction LMA Size: 5.0 Number of attempts: 1 Tube secured with: Tape

## 2015-09-18 NOTE — Transfer of Care (Signed)
Immediate Anesthesia Transfer of Care Note  Patient: Manuel Stafford.  Procedure(s) Performed: Procedure(s): CYSTOSCOPY WITH STENT PLACEMENT (Right)  Patient Location: PACU  Anesthesia Type:General  Level of Consciousness: awake and alert   Airway & Oxygen Therapy: Patient Spontanous Breathing  Post-op Assessment: Report given to RN  Post vital signs: Reviewed and stable  Last Vitals:  Filed Vitals:   09/18/15 2043 09/18/15 2044  BP: 123/80 123/78  Pulse: 97 99  Temp: 36.9 C 36.6 C  Resp: 21 21    Complications: No apparent anesthesia complications

## 2015-09-19 ENCOUNTER — Encounter: Payer: Self-pay | Admitting: Urology

## 2015-09-19 DIAGNOSIS — R109 Unspecified abdominal pain: Secondary | ICD-10-CM

## 2015-09-19 DIAGNOSIS — N201 Calculus of ureter: Secondary | ICD-10-CM | POA: Diagnosis not present

## 2015-09-19 DIAGNOSIS — R1031 Right lower quadrant pain: Secondary | ICD-10-CM | POA: Diagnosis not present

## 2015-09-19 LAB — BASIC METABOLIC PANEL
Anion gap: 8 (ref 5–15)
BUN: 18 mg/dL (ref 6–20)
CHLORIDE: 102 mmol/L (ref 101–111)
CO2: 25 mmol/L (ref 22–32)
CREATININE: 1.3 mg/dL — AB (ref 0.61–1.24)
Calcium: 8.8 mg/dL — ABNORMAL LOW (ref 8.9–10.3)
GFR calc non Af Amer: 54 mL/min — ABNORMAL LOW (ref >60)
GLUCOSE: 151 mg/dL — AB (ref 65–99)
Potassium: 3.3 mmol/L — ABNORMAL LOW (ref 3.5–5.1)
Sodium: 135 mmol/L (ref 135–145)

## 2015-09-19 LAB — GLUCOSE, CAPILLARY: Glucose-Capillary: 137 mg/dL — ABNORMAL HIGH (ref 65–99)

## 2015-09-19 MED ORDER — AMOXICILLIN-POT CLAVULANATE 875-125 MG PO TABS: 1.0000 | ORAL_TABLET | Freq: Two times a day (BID) | ORAL | Status: DC

## 2015-09-19 MED ORDER — TAMSULOSIN HCL 0.4 MG PO CAPS: 0.4000 mg | ORAL_CAPSULE | Freq: Every day | ORAL | Status: DC

## 2015-09-19 MED ORDER — OXYCODONE-ACETAMINOPHEN 5-325 MG PO TABS
1.0000 | ORAL_TABLET | ORAL | Status: DC | PRN
Start: 2015-09-19 — End: 2015-10-03

## 2015-09-19 MED ORDER — OXYBUTYNIN CHLORIDE 5 MG PO TABS: 5.0000 mg | ORAL_TABLET | Freq: Three times a day (TID) | ORAL | Status: DC | PRN

## 2015-09-19 NOTE — Progress Notes (Signed)
09/19/2015 11:15  Manuel Stafford. to be D/C'd Home per MD order.  Discussed prescriptions and follow up appointments with the patient. Prescriptions given to patient, medication list explained in detail. Pt verbalized understanding.    Medication List    TAKE these medications        ACCU-CHEK SMARTVIEW test strip  Generic drug:  glucose blood  ACCU-CHEK SMARTVIEW (In Vitro Strip)  1 (one) Strip Strip two times daily and as needed for 0 days  Quantity: 200;  Refills: 3   Ordered :15-Jun-2013  [name], MA, [name] ;  Started 15-Jun-2013 Active Comments: Medication taken as needed. Diagnosis code 250.00     amoxicillin-clavulanate 875-125 MG tablet  Commonly known as:  AUGMENTIN  Take 1 tablet by mouth 2 (two) times daily.     aspirin 81 MG tablet  Take by mouth.     BENADRYL ALLERGY 25 mg capsule  Generic drug:  diphenhydrAMINE  Take by mouth.     hydrochlorothiazide 25 MG tablet  Commonly known as:  HYDRODIURIL  Take 1 tablet (25 mg total) by mouth daily.     HYDROcodone-acetaminophen 5-325 MG tablet  Commonly known as:  NORCO/VICODIN  Take 1-2 tablets by mouth every 6 (six) hours as needed for moderate pain.     HYDROcodone-acetaminophen 10-325 MG tablet  Commonly known as:  NORCO  Take 1-2 tablets by mouth every 4 (four) hours as needed.     hydrocortisone-pramoxine 2.5-1 % rectal cream  Commonly known as:  ANALPRAM HC  Place 1 application rectally 3 (three) times daily.     losartan 100 MG tablet  Commonly known as:  COZAAR  TAKE 1 TABLET BY MOUTH ONCE A DAY     metFORMIN 1000 MG tablet  Commonly known as:  GLUCOPHAGE  Take 1 tablet (1,000 mg total) by mouth 2 (two) times daily with a meal.     MULTIPLE VITAMIN PO  Take by mouth.     omeprazole 20 MG capsule  Commonly known as:  PRILOSEC  Take 1 capsule (20 mg total) by mouth 2 (two) times daily before a meal.     oxybutynin 5 MG tablet  Commonly known as:  DITROPAN  Take 1 tablet (5 mg total)  by mouth every 8 (eight) hours as needed for bladder spasms.     oxyCODONE-acetaminophen 5-325 MG tablet  Commonly known as:  PERCOCET  Take 1-2 tablets by mouth every 4 (four) hours as needed for moderate pain or severe pain.     pravastatin 20 MG tablet  Commonly known as:  PRAVACHOL  Take 1 tablet (20 mg total) by mouth daily.     rosuvastatin 20 MG tablet  Commonly known as:  CRESTOR  Take 1 tablet (20 mg total) by mouth daily.     Saw Palmet[employer] Berry 160 MG Caps  Take by mouth.     tamsulosin 0.4 MG Caps capsule  Commonly known as:  FLOMAX  Take 1 capsule (0.4 mg total) by mouth daily.     tiZANidine 4 MG tablet  Commonly known as:  ZANAFLEX  Reported on 09/19/2015     traMADol 50 MG tablet  Commonly known as:  ULTRAM  Take 1 tablet (50 mg total) by mouth at bedtime.     Vitamin D3 2000 units capsule  Take 2,000 Units by mouth daily.        Filed Vitals:   09/18/15 2202 09/19/15 0540  BP: 110/78 115/55  Pulse: 87 80  Temp: 98.2  F (36.8 C) 98.1 F (36.7 C)  Resp: 20 19    Skin clean, dry and intact without evidence of skin break down, no evidence of skin tears noted. IV catheter discontinued intact. Site without signs and symptoms of complications. Dressing and pressure applied. Pt denies pain at this time. No complaints noted.  An After Visit Summary was printed and given to the patient. Patient escorted via [place], and D/C home via private auto.  Dola Argyle

## 2015-09-19 NOTE — Discharge Instructions (Signed)
You have a ureteral stent in place.  This is a tube that extends from your kidney to your bladder.  This may cause urinary bleeding, burning with urination, and urinary frequency.  Please call our office or present to the ED if you develop fevers >101 or pain which is not able to be controlled with oral pain medications.  You may be given either Flomax and/ or ditropan to help with bladder spasms and stent pain in addition to pain medications.   ° °[facility] °[address], [address] °[place], [place] [address] °[phone] °

## 2015-09-19 NOTE — Discharge Summary (Addendum)
Date of admission: 09/18/2015  Date of discharge: 09/19/2015  Admission diagnosis:  Right obstructing ureteral stones,  Possible UTI, flank pain, AKI  Discharge diagnosis: same as above  Secondary diagnoses:  Patient Active Problem List   Diagnosis Date Noted  . Nephrolithiasis 08/29/2015  . Right ureteral stone 08/29/2015  . Abnormal ECG 08/22/2015  . Benign essential HTN 08/22/2015  . Combined fat and carbohydrate induced hyperlipemia 08/22/2015  . Breathlessness on exertion 08/22/2015  . Lung nodules 04/28/2015  . Allergic rhinitis 12/31/2014  . Diabetes ([place]) 12/31/2014  . Failure of erection 12/31/2014  . Fatty liver disease, nonalcoholic 64/15/8309  . Acid reflux 12/31/2014  . BP (high blood pressure) 12/31/2014  . HLD (hyperlipidemia) 12/31/2014  . Lumbosacral spondylosis without myelopathy 12/31/2014  . Adiposity 12/31/2014  . Herpes zona 12/31/2014  . Disorder of shoulder 12/31/2014  . Apnea, sleep 12/31/2014  . Avitaminosis D 12/31/2014  . DDD (degenerative disc disease), lumbar 11/30/2013    History and Physical: For full details, please see admission history and physical. Briefly, Manuel R Carsen Leaf. is a 70 y.o. year old patient with obstructing right ureteral stone fragments, severe pain, acute kidney injury, leukocytosis and possible urinary tract infection who was taken emergently from the ED for right ureteral stent placement. He was admitted following this procedure for IV antibiotics and observation.  Hospital Course: Patient tolerated the procedure well.  He was then transferred to the floor after an uneventful PACU stay.  His hospital course was uncomplicated.  On POD#1  he had met discharge criteria: was eating a regular diet, was up and ambulating independently,  pain was well controlled, was voiding without a catheter, and was ready to for discharge.  His labs improved and he was hemodynamically stable.  Physical Exam  Constitutional: He is oriented to  person, place, and time. He appears well-developed and well-nourished.  HENT:  Head: Normocephalic and atraumatic.  Eyes: EOM are normal. Pupils are equal, round, and reactive to light.  Neck: Normal range of motion. Neck supple.  Cardiovascular: Normal rate and regular rhythm.   Pulmonary/Chest: Effort normal and breath sounds normal.  Abdominal: Soft. Bowel sounds are normal.  Genitourinary: Penis normal.  Foley in place with clear yellow urine  Minimal right flank pain  Musculoskeletal: He exhibits no edema or tenderness.  Neurological: He is alert and oriented to person, place, and time.  Skin: Skin is warm and dry.  Psychiatric: He has a normal mood and affect. His behavior is normal.  Vitals reviewed.    Laboratory values:   Recent Labs  09/18/15 1254  WBC 17.3*  HGB 15.5  HCT 44.0    Recent Labs  09/18/15 1254 09/19/15 0455  NA 129* 135  K 3.4* 3.3*  CL 96* 102  CO2 22 25  GLUCOSE 204* 151*  BUN 20 18  CREATININE 1.90* 1.30*  CALCIUM 9.5 8.8*   No results for input(s): LABPT, INR in the last 72 hours. No results for input(s): LABURIN in the last 72 hours. Results for orders placed or performed in visit on 08/29/15  Microscopic Examination     Status: Abnormal   Collection Time: 08/29/15  9:18 AM  Result Value Ref Range Status   WBC, UA 0-5 0 -  5 /hpf Final   RBC, UA 3-10 (A) 0 -  2 /hpf Final   Epithelial Cells (non renal) None seen 0 - 10 /hpf Final   Mucus, UA Present (A) Not Estab. Final   Bacteria, UA None  seen None seen/Few Final    Disposition: Home  Discharge instruction: You have a ureteral stent in place.  This is a tube that extends from your kidney to your bladder.  This may cause urinary bleeding, burning with urination, and urinary frequency.  Please call our office or present to the ED if you develop fevers >101 or pain which is not able to be controlled with oral pain medications.  You may be given either Flomax and/ or ditropan to help  with bladder spasms and stent pain in addition to pain medications.    [facility] [address], [place], [place] [address] [phone]  Discharge medications:    Medication List    TAKE these medications        amoxicillin-clavulanate 875-125 MG tablet  Commonly known as:  AUGMENTIN  Take 1 tablet by mouth 2 (two) times daily.     oxybutynin 5 MG tablet  Commonly known as:  DITROPAN  Take 1 tablet (5 mg total) by mouth every 8 (eight) hours as needed for bladder spasms.     oxyCODONE-acetaminophen 5-325 MG tablet  Commonly known as:  PERCOCET  Take 1-2 tablets by mouth every 4 (four) hours as needed for moderate pain or severe pain.     tamsulosin 0.4 MG Caps capsule  Commonly known as:  FLOMAX  Take 1 capsule (0.4 mg total) by mouth daily.      ASK your doctor about these medications        ACCU-CHEK SMARTVIEW test strip  Generic drug:  glucose blood  ACCU-CHEK SMARTVIEW (In Vitro Strip)  1 (one) Strip Strip two times daily and as needed for 0 days  Quantity: 200;  Refills: 3   Ordered :15-Jun-2013  [name], MA, [name] ;  Started 15-Jun-2013 Active Comments: Medication taken as needed. Diagnosis code 250.00     aspirin 81 MG tablet  Take by mouth.     BENADRYL ALLERGY 25 mg capsule  Generic drug:  diphenhydrAMINE  Take by mouth.     hydrochlorothiazide 25 MG tablet  Commonly known as:  HYDRODIURIL  Take 1 tablet (25 mg total) by mouth daily.     HYDROcodone-acetaminophen 5-325 MG tablet  Commonly known as:  NORCO/VICODIN  Take 1-2 tablets by mouth every 6 (six) hours as needed for moderate pain.     HYDROcodone-acetaminophen 10-325 MG tablet  Commonly known as:  NORCO  Take 1-2 tablets by mouth every 4 (four) hours as needed.     hydrocortisone-pramoxine 2.5-1 % rectal cream  Commonly known as:  ANALPRAM HC  Place 1 application rectally 3 (three) times daily.     losartan 100 MG tablet  Commonly known as:   COZAAR  TAKE 1 TABLET BY MOUTH ONCE A DAY     metFORMIN 1000 MG tablet  Commonly known as:  GLUCOPHAGE  Take 1 tablet (1,000 mg total) by mouth 2 (two) times daily with a meal.     MULTIPLE VITAMIN PO  Take by mouth.     omeprazole 20 MG capsule  Commonly known as:  PRILOSEC  Take 1 capsule (20 mg total) by mouth 2 (two) times daily before a meal.     pravastatin 20 MG tablet  Commonly known as:  PRAVACHOL  Take 1 tablet (20 mg total) by mouth daily.     rosuvastatin 20 MG tablet  Commonly known as:  CRESTOR  Take 1 tablet (20 mg total) by mouth daily.     Saw [employer]  Berry 160 MG Caps  Take by mouth.     tiZANidine 4 MG tablet  Commonly known as:  ZANAFLEX  Reported on 09/19/2015     traMADol 50 MG tablet  Commonly known as:  ULTRAM  Take 1 tablet (50 mg total) by mouth at bedtime.     Vitamin D3 2000 units capsule  Take 2,000 Units by mouth daily.        Followup:  Follow-up Information    Follow up with [facility], PA-C.   Specialties:  Urology, Radiology   Why:  with Zara Council (4/6-- already scheduled)   Contact information:   [address] [place] [address] [phone]

## 2015-09-20 LAB — URINE CULTURE: CULTURE: NO GROWTH

## 2015-09-22 ENCOUNTER — Telehealth: Payer: Self-pay

## 2015-09-22 NOTE — Telephone Encounter (Signed)
Pt called wanting to know if he was "going to be ok" to keep the current stent in place for a couple of weeks. Reinforced with pt as long as he did not develop any n/v, f/c, more debilitating pain he should be ok. Pt voiced understanding. Pt then inquired about the next surgery. Pt stated at this point all he knows is he will need another surgery. Per Amy pt is on the surgery schedule with Dr. Erlene Quan on 10/03/15. Amy also moved pt appt with Larene Beach to 09/26/15 to give pt time to complete pre-op needs. Made pt aware of all information. Pt voiced understanding and gratitude for [facility] help.

## 2015-09-26 ENCOUNTER — Ambulatory Visit
Admission: RE | Admit: 2015-09-26 | Discharge: 2015-09-26 | Disposition: A | Discharge status: Home / Self Care | Payer: PPO | Source: Ambulatory Visit | Attending: Urology | Admitting: Urology

## 2015-09-26 ENCOUNTER — Other Ambulatory Visit: Payer: Self-pay | Admitting: *Deleted

## 2015-09-26 ENCOUNTER — Encounter: Payer: Self-pay | Admitting: Urology

## 2015-09-26 ENCOUNTER — Ambulatory Visit (INDEPENDENT_AMBULATORY_CARE_PROVIDER_SITE_OTHER): Payer: PPO | Admitting: Urology

## 2015-09-26 VITALS — BP 144/80 | HR 92 | Ht 72.0 in | Wt 234.0 lb

## 2015-09-26 DIAGNOSIS — N201 Calculus of ureter: Secondary | ICD-10-CM

## 2015-09-26 DIAGNOSIS — N200 Calculus of kidney: Secondary | ICD-10-CM

## 2015-09-26 DIAGNOSIS — N132 Hydronephrosis with renal and ureteral calculous obstruction: Secondary | ICD-10-CM | POA: Diagnosis not present

## 2015-09-26 DIAGNOSIS — R3129 Other microscopic hematuria: Secondary | ICD-10-CM | POA: Diagnosis not present

## 2015-09-26 NOTE — Progress Notes (Signed)
09/26/2015 11:17 PM   Manuel Stafford. 1945-07-25 [id number]  Referring provider: [name], MD [address] [place], [place] [address]  Chief Complaint  Patient presents with  . Nephrolithiasis    pre-op    HPI: Patient is 70 year old Caucasian male who presents today to discuss further treatment for his right ureteral stones.  Patient initially had an episode of gross hematuria associated with right-sided flank pain in February 2017.  A CT scan of the abdomen and pelvis at that time noted a right proximal ureteral stone that measured 7 x 9 mm associated with obstruction.  He then underwent right ESWL for definitive treatment for the right ureteral stone on 09/15/2015.  The next day, patient had intractable right-sided flank pain. He presented to our office and was given an injection of ketorolac 60 mg IM. Patient then stated the pain abated. Unfortunately the pain persisted and he developed constipation and was seen in the emergency room on 09/18/2015.  His WBCs were found to be 17.3.  His creatinine had increased from 1.18 to 1.9.  CT stone study performed that night noted an 8 mm calculus in the mid right ureter and 14 mm  elongated calculus in the distal right ureter at the UVJ associated with moderate right hydronephrosis.  He then underwent emergent right ureteral stent placement with Dr. Erlene Quan.  Today,  he is having intermittent right-sided flank pain. The pain he is describing sounds like normal stent pain.  He is had a low-grade fever, but he denies chills, nausea and vomiting.  PMH: Past Medical History  Diagnosis Date  . Hypertension   . Diabetes mellitus without complication [facility])     Patient takes Metformin.    Surgical History: Past Surgical History  Procedure Laterality Date  . Hernia repair    . Rotator cuff repair Right 06/2012  . Extracorporeal shock wave lithotripsy Right 09/15/2015    Procedure: EXTRACORPOREAL SHOCK WAVE  LITHOTRIPSY (ESWL);  Surgeon: Nickie Retort, MD;  Location: [facility] ORS;  Service: Urology;  Laterality: Right;  . Cystoscopy with stent placement Right 09/18/2015    Procedure: CYSTOSCOPY WITH STENT PLACEMENT;  Surgeon: Hollice Espy, MD;  Location: [facility] ORS;  Service: Urology;  Laterality: Right;    Home Medications:    Medication List       This list is accurate as of: 09/26/15 11:17 PM.  Always use your most recent med list.               ACCU-CHEK SMARTVIEW test strip  Generic drug:  glucose blood  ACCU-CHEK SMARTVIEW (In Vitro Strip)  1 (one) Strip Strip two times daily and as needed for 0 days  Quantity: 200;  Refills: 3   Ordered :15-Jun-2013  [name], MA, [name] ;  Started 15-Jun-2013 Active Comments: Medication taken as needed. Diagnosis code 250.00     aspirin 81 MG tablet  Take by mouth.     BENADRYL ALLERGY 25 mg capsule  Generic drug:  diphenhydrAMINE  Take by mouth.     hydrochlorothiazide 25 MG tablet  Commonly known as:  HYDRODIURIL  Take 1 tablet (25 mg total) by mouth daily.     losartan 100 MG tablet  Commonly known as:  COZAAR  TAKE 1 TABLET BY MOUTH ONCE A DAY     metFORMIN 1000 MG tablet  Commonly known as:  GLUCOPHAGE  Take 1 tablet (1,000 mg total) by mouth 2 (two) times daily with a meal.  MULTIPLE VITAMIN PO  Take by mouth.     omeprazole 20 MG capsule  Commonly known as:  PRILOSEC  Take 1 capsule (20 mg total) by mouth 2 (two) times daily before a meal.     oxybutynin 5 MG tablet  Commonly known as:  DITROPAN  Take 1 tablet (5 mg total) by mouth every 8 (eight) hours as needed for bladder spasms.     oxyCODONE-acetaminophen 5-325 MG tablet  Commonly known as:  PERCOCET  Take 1-2 tablets by mouth every 4 (four) hours as needed for moderate pain or severe pain.     pravastatin 20 MG tablet  Commonly known as:  PRAVACHOL  Take 1 tablet (20 mg total) by mouth daily.     rosuvastatin 20 MG tablet  Commonly known as:   CRESTOR  Take 1 tablet (20 mg total) by mouth daily.     Saw Palmetto Berry 160 MG Caps  Take by mouth.     tamsulosin 0.4 MG Caps capsule  Commonly known as:  FLOMAX  Take 1 capsule (0.4 mg total) by mouth daily.     tiZANidine 4 MG tablet  Commonly known as:  ZANAFLEX  Reported on 09/19/2015     traMADol 50 MG tablet  Commonly known as:  ULTRAM  Take 1 tablet (50 mg total) by mouth at bedtime.     Vitamin D3 2000 units capsule  Take 2,000 Units by mouth daily.        Allergies: No Known Allergies  Family History: Family History  Problem Relation Age of Onset  . Hypertension Mother   . Diabetes Father   . CAD Father   . Diabetes Sister   . Heart disease Sister   . CAD Sister     Social History:  reports that he quit smoking about 41 years ago. His smoking use included Cigarettes. He has a 30 pack-year smoking history. He has never used smokeless tobacco. He reports that he does not drink alcohol or use illicit drugs.  ROS: UROLOGY Frequent Urination?: Yes Hard to postpone urination?: No Burning/pain with urination?: Yes Get up at night to urinate?: Yes Leakage of urine?: No Urine stream starts and stops?: No Trouble starting stream?: No Do you have to strain to urinate?: No Blood in urine?: No Urinary tract infection?: No Sexually transmitted disease?: No Injury to kidneys or bladder?: No Painful intercourse?: No Weak stream?: No Erection problems?: No Penile pain?: No  Gastrointestinal Nausea?: No Vomiting?: No Indigestion/heartburn?: No Diarrhea?: No Constipation?: No  Constitutional Fever: Yes Night sweats?: No Weight loss?: No Fatigue?: No  Skin Skin rash/lesions?: No Itching?: No  Eyes Blurred vision?: No Double vision?: No  Ears/Nose/Throat Sore throat?: No Sinus problems?: No  Hematologic/Lymphatic Swollen glands?: No Easy bruising?: Yes  Cardiovascular Leg swelling?: No Chest pain?: No  Respiratory Cough?:  No Shortness of breath?: No  Endocrine Excessive thirst?: No  Musculoskeletal Back pain?: Yes Joint pain?: No  Neurological Headaches?: No Dizziness?: No  Psychologic Depression?: No Anxiety?: No  Physical Exam: BP 144/80 mmHg  Pulse 92  Ht 6' (1.829 m)  Wt 234 lb (106.142 kg)  BMI 31.73 kg/m2  Constitutional: Well nourished. Alert and oriented, No acute distress. HEENT: [place] AT, moist mucus membranes. Trachea midline, no masses. Cardiovascular: No clubbing, cyanosis, or edema. Respiratory: Normal respiratory effort, no increased work of breathing. GI: Abdomen is soft, non tender, non distended, no abdominal masses. Liver and spleen not palpable.  No hernias appreciated.  Stool sample for  occult testing is not indicated.   GU: No CVA tenderness.  No bladder fullness or masses.   Skin: No rashes, bruises or suspicious lesions. Lymph: No cervical or inguinal adenopathy. Neurologic: Grossly intact, no focal deficits, moving all 4 extremities. Psychiatric: Normal mood and affect.  Laboratory Data: Lab Results  Component Value Date   WBC 17.3* 09/18/2015   HGB 15.5 09/18/2015   HCT 44.0 09/18/2015   MCV 93.9 09/18/2015   PLT 188 09/18/2015    Lab Results  Component Value Date   CREATININE 1.30* 09/19/2015    Lab Results  Component Value Date   PSA 0.3 10/14/2014    Lab Results  Component Value Date   HGBA1C 6.9 06/15/2015    Lab Results  Component Value Date   TSH 2.000 02/22/2015       Component Value Date/Time   CHOL 161 02/22/2015 0902   CHOL 172 07/13/2014   HDL 32* 02/22/2015 0902   HDL 28* 07/13/2014   LDLCALC 95 02/22/2015 0902   LDLCALC 100 07/13/2014    Lab Results  Component Value Date   AST 23 09/18/2015   Lab Results  Component Value Date   ALT 30 09/18/2015    Urinalysis Results for orders placed or performed in visit on 09/26/15  Microscopic Examination  Result Value Ref Range   WBC, UA 6-10 (A) 0 -  5 /hpf   RBC, UA  >30 (A) 0 -  2 /hpf   Epithelial Cells (non renal) >10 (A) 0 - 10 /hpf   Bacteria, UA None seen None seen/Few  Urinalysis, Complete  Result Value Ref Range   Specific Gravity, UA 1.020 1.005 - 1.030   pH, UA 5.5 5.0 - 7.5   Color, UA Yellow Yellow   Appearance Ur Cloudy (A) Clear   Leukocytes, UA Trace (A) Negative   Protein, UA 2+ (A) Negative/Trace   Glucose, UA Negative Negative   Ketones, UA Negative Negative   RBC, UA 3+ (A) Negative   Bilirubin, UA Negative Negative   Urobilinogen, Ur 0.2 0.2 - 1.0 mg/dL   Nitrite, UA Negative Negative   Microscopic Examination See below:     Pertinent Imaging: CLINICAL DATA: Right kidney stones.  EXAM: ABDOMEN - 1 VIEW  COMPARISON: CT scan of September 18, 2015. Radiograph of September 15, 2015.  FINDINGS: The bowel gas pattern is normal. Right-sided ureteral stent is seen in grossly good position. No definite renal or ureteral calculi are seen at this time.  IMPRESSION: Right-sided ureteral stent is in grossly good position. No definite evidence of renal or ureteral calculi seen at this time.   Electronically Signed  By: Marijo Conception, M.D.  On: 09/26/2015 16:17  Assessment & Plan:    Patient will be undergoing a right ureteroscopy  with right laser lithotripsy with right ureteral stent exchange for definitive treatment of an 8 mm right mid ureteral stone and 13 mm of stone debris within the distal right ureter.  1. Right ureteral stones:   Patient will be undergoing right URS/LL/right ureteral stent exchange for definitive treatment of the stones.  I explained to the patient how the procedure is performed and the risks involved.    I informed patient that she will have a stent placed during the procedure and will remain in place after the procedure for a short time.  It will be removed in the office with a cystoscope, unless a string in left in place.  I informed that patient that about 50%  of patients who undergo  ureteroscopy and have a stent will have "stent pain," and this is by far the most common risk/complaint following ureteroscopy. A stent is a soft plastic tube (about half the size of IV tubing) that allows the kidney to drain to the bladder regardless of edema or obstruction. Not only can the stent "rub" on the inside of the bladder, causing a feeling of needing to urinate/overactive bladder, but also the stent allows urine to pass up from the bladder to the kidney during urination - causing symptoms from a warm, tingling sensation to intense pain in the affected flank.   They may be residual stones within the kidney or ureter may be present up to 40% of the time following ureteroscopy, depending on the original stone size and location. These stone fragments will be seen and addressed on follow-up imaging.  Injury to the ureter is the most common intra-operative complication during ureteroscopy. The reported risk of perforation ranges greatly, depending on whether it is defined as a complete perforation (0.1-0.7% - think of this as a hole through the entire ureter), a partial perforation (1.6% - a hole nearly through the entire ureter), or mucosal tear/scrape (5% - these are similar to a sore on the inside of the mouth). Almost 100% of these will heal with prolonged stenting (anywhere between 2 - 4 weeks). Should a large perforation occur, your urologist may chose to stop the procedure and return on another day when the ureter has had time to heal.  I also explained the risks of general anesthesia, such as: MI, CVA, paralysis, coma and/or death.   - Urinalysis, Complete - CULTURE, URINE COMPREHENSIVE  2. Right hydronephrosis:   Patient has a history of right hydronephrosis and will be undergoing manipulation of the right ureter. Once he has completed his definitive treatment for the right ureteral stones and the ureteral stent is removed, he will present in one month for an hour [place] to ensure the  hydronephrosis has resolved.  3. Microscopic hematuria:   Patient has greater than 30 RBCs per prior field on today's UA.  This is associated with ureteral stent and right ureteral stones.  We'll continue to monitor once the patient has definitive treatment for his right ureteral stones and the ureteral stent is removed to ensure the hematuria resolves.   Return for right URS/LL/ right stent exchange.  These notes generated with voice recognition software. I apologize for typographical errors.  Zara Council, [facility] [address] [place], [place] [address] [phone]

## 2015-09-27 LAB — URINALYSIS, COMPLETE
BILIRUBIN UA: NEGATIVE
Glucose, UA: NEGATIVE
Ketones, UA: NEGATIVE
Nitrite, UA: NEGATIVE
PH UA: 5.5 (ref 5.0–7.5)
SPEC GRAV UA: 1.02 (ref 1.005–1.030)
Urobilinogen, Ur: 0.2 mg/dL (ref 0.2–1.0)

## 2015-09-27 LAB — MICROSCOPIC EXAMINATION
BACTERIA UA: NONE SEEN
Epithelial Cells (non renal): >10 /hpf — AB (ref 0–10)
RBC, UA: >30 /hpf — AB (ref 0–?)

## 2015-09-28 ENCOUNTER — Encounter
Admission: RE | Admit: 2015-09-28 | Discharge: 2015-09-28 | Disposition: A | Discharge status: Home / Self Care | Payer: PPO | Source: Ambulatory Visit | Attending: Urology | Admitting: Urology

## 2015-09-28 DIAGNOSIS — Z01818 Encounter for other preprocedural examination: Secondary | ICD-10-CM | POA: Insufficient documentation

## 2015-09-28 HISTORY — DX: Gastro-esophageal reflux disease without esophagitis: K21.9

## 2015-09-28 HISTORY — DX: Dorsalgia, unspecified: M54.9

## 2015-09-28 HISTORY — DX: Hyperlipidemia, unspecified: E78.5

## 2015-09-28 LAB — CBC
HCT: 43 % (ref 40.0–52.0)
HEMOGLOBIN: 14.8 g/dL (ref 13.0–18.0)
MCH: 33.3 pg (ref 26.0–34.0)
MCHC: 34.4 g/dL (ref 32.0–36.0)
MCV: 96.7 fL (ref 80.0–100.0)
PLATELETS: 197 10*3/uL (ref 150–440)
RBC: 4.45 MIL/uL (ref 4.40–5.90)
RDW: 13.1 % (ref 11.5–14.5)
WBC: 8.7 10*3/uL (ref 3.8–10.6)

## 2015-09-28 LAB — CULTURE, URINE COMPREHENSIVE

## 2015-09-28 LAB — POTASSIUM: POTASSIUM: 4 mmol/L (ref 3.5–5.1)

## 2015-09-28 NOTE — Pre-Procedure Instructions (Signed)
Cardiac clearance on chart by Dr Nehemiah Massed.

## 2015-09-28 NOTE — Pre-Procedure Instructions (Signed)
Name Value Range  LV Ejection Fraction (%) 55   Aortic Valve Stenosis Grade none   Aortic Valve Regurgitation Grade none   Aortic Valve Max Velocity (m/s) 1.4 m/sec   Mitral Valve Stenosis Grade none   Mitral Valve Regurgitation Grade mild   Tricuspid Valve Regurgitation Grade mild   Tricuspid Valve Regurgitation Max Velocity (m/s) 2.2 m/sec   Right Ventricle Systolic Pressure (mmHg) [id number] mmHg   LV End Diastolic Diameter (cm) 3.9 cm  LV End Systolic Diameter (cm) 2.7 cm  LV Septum Wall Thickness (cm) 1.4 cm  LV Posterior Wall Thickness (cm) 1.1 cm  Left Atrium Diameter (cm) 3.9 cm   Result Narrative                     CARDIOLOGY DEPARTMENT                         [place], [place] R, JR.                       [facility]                            [id number]                   [place] #: [id number]         [address] [place], [place] [address]             Date: 09/05/2015 02:06 PM                                                                  Adult Male Age: 70 yrs                    ECHOCARDIOGRAM REPORT                         Outpatient                                                                  [id number]           STUDY:CHEST WALL                       TAPE:           MD1:  Flossie Dibble            ECHO:Yes   DOPPLER:Yes                FILE:           BP: 124/76 mmHg           COLOR:Yes  CONTRAST:Yes     MACHINE:Philips  Height: 72 in       RV BIOPSY:No         3D:No   SOUND QLTY:Moderate           Weight: 233 lb          MEDIUM:None                                             BSA: 2.3 m2  ___________________________________________________________________________________________           HISTORY:Chest pain            REASON:Assess, LV function        INDICATION:Shortness of breath [R06.02  (ICD-10-CM)];  ___________________________________________________________________________________________ ECHOCARDIOGRAPHIC MEASUREMENTS 2D DIMENSIONS AORTA             Values      Normal Range      MAIN PA          Values      Normal Range           Annulus:  1.8 cm    [2.3 - 2.9]                PA Main:  nm*       [1.5 - 2.1]         Aorta Sin:  nm*       [3.1 - 3.7]       RIGHT VENTRICLE       ST Junction:  nm*       [2.6 - 3.2]                RV Base:  nm*       [ < 4.2]         Asc.Aorta:  nm*       [2.6 - 3.4]                 RV Mid:  nm*       [ < 3.5]  LEFT VENTRICLE                                        RV Length:  nm*       [ < 8.6]             LVIDd:  3.9 cm    [4.2 - 5.9]       INFERIOR VENA CAVA             LVIDs:  2.7 cm                              Max. IVC:  nm*       [ <= 2.1]                FS:  31.4 %    [> 25]                    Min. IVC:  nm*               SWT:  1.4 cm    [0.6 - 1.0]                   ------------------  PWT:  1.1 cm    [0.6 - 1.0]                   nm* - not measured  LEFT ATRIUM           LA Diam:  3.9 cm    [3.0 - 4.0]       LA A4C Area:  nm*       [ < 20]         LA Volume:  nm*       [18 - 58]  ___________________________________________________________________________________________ ECHOCARDIOGRAPHIC DESCRIPTIONS  AORTIC ROOT         Size:Normal   Dissection:INDETERM FOR DISSECTION  AORTIC VALVE     Leaflets:Tricuspid         Morphology:MILDLY THICKENED     Mobility:Fully mobile  LEFT VENTRICLE         Size:SMALL               Anterior:Normal  Contraction:Normal               Lateral:Normal   Closest EF:>55% (Estimated)      Septal:Normal    LV Masses:No Masses             Apical:Normal          [id number] LVH        Inferior:Normal                                 Posterior:Normal Dias.FxClass:N/A  MITRAL VALVE     Leaflets:Normal              Mobility:Fully mobile   Morphology:Normal  LEFT ATRIUM          Size:Normal             LA Masses:No masses  MAIN PA         Size:Normal  PULMONIC VALVE     Leaflets:N/A               Morphology:Normal     Mobility:Fully mobile  RIGHT VENTRICLE    RV Masses:No Masses               Size:Normal    Free Wall:Normal           Contraction:Normal  TRICUSPID VALVE     Leaflets:Normal              Mobility:Fully mobile   Morphology:Normal  RIGHT ATRIUM         Size:Normal              RA Other:None      RA Mass:No masses  PERICARDIUM        Fluid:No effusion  INFERIOR VENACAVA         Size:Not seen Not Seen   ____________________________________________________________________ DOPPLER ECHO and OTHER SPECIAL PROCEDURES    Aortic:No AR                         No AS           142.0 cm/sec peak vel         8.1 mmHg peak grad     Mitral:MILD MR                       No MS  3.4 cm^2 by DOPPLER           MV Inflow E Vel=55.8 cm/sec   MV Annulus E'Vel=5.6 cm/sec           E/E'Ratio=10.0  Tricuspid:MILD TR                       No TS           219.5 cm/sec peak TR vel      24.3 mmHg peak RV pressure  Pulmonary:MILD PR                       No PS           88.5 cm/sec peak vel          3.1 mmHg peak grad     ___________________________________________________________________________________________ INTERPRETATION NORMAL LEFT VENTRICULAR SYSTOLIC FUNCTION   WITH MODERATE LVH MILD VALVULAR REGURGITATION (See above) NO VALVULAR STENOSIS EF 55%   ___________________________________________________________________________________________ Electronically signed by: MD Serafina Royals on 09/05/2015 03:25 PM             Performed By: Maurilio Lovely, [place]       Ordering Physician: Serafina Royals ___________________________________________________________________________________________   Status Results Details   Encounter Summary  February

## 2015-09-28 NOTE — Pre-Procedure Instructions (Signed)
EXAM: CHEST TWO VIEW DATE: 08/11/15 09:30:00 ACCESSION: [id number] UN DICTATED: 08/11/15 09:56:27 INTERPRETATION LOCATION: [place]  CLINICAL INDICATION: 70 Year Old (M): R05 - Cough. COUGH- Pt c/o cough, congestion, fever x 3wks    COMPARISON: None.  TECHNIQUE:Upright PA and lateral views of the chest.  FINDINGS: Normal lung volumes.  Mild left basilar atelectasis. No focal consolidation. No pleural effusion or pneumothorax. No pulmonary edema.   Cardiomediastinal silhouette is within normal limits.  No acute osseous abnormality.  IMPRESSION: Mild left basilar atelectasis.

## 2015-09-28 NOTE — Patient Instructions (Signed)
  Your procedure is scheduled on: 10/03/15 Mon Report to Day Surgery.2nd floor medical mall To find out your arrival time please call [phone] between 1PM - 3PM on 09/30/15 Fri.  Remember: Instructions that are not followed completely may result in serious medical risk, up to and including death, or upon the discretion of your surgeon and anesthesiologist your surgery may need to be rescheduled.    x____ 1. Do not eat food or drink liquids after midnight. No gum chewing or hard candies.     ____ 2. No Alcohol for 24 hours before or after surgery.   ____ 3. Bring all medications with you on the day of surgery if instructed.    _x___ 4. Notify your doctor if there is any change in your medical condition     (cold, fever, infections).     Do not wear jewelry, make-up, hairpins, clips or nail polish.  Do not wear lotions, powders, or perfumes. You may wear deodorant.  Do not shave 48 hours prior to surgery. Men may shave face and neck.  Do not bring valuables to the hospital.    [facility] is not responsible for any belongings or valuables.               Contacts, dentures or bridgework may not be worn into surgery.  Leave your suitcase in the car. After surgery it may be brought to your room.  For patients admitted to the hospital, discharge time is determined by your                treatment team.   Patients discharged the day of surgery will not be allowed to drive home.   Please read over the following fact sheets that you were given:      _x_ Take these medicines the morning of surgery with A SIP OF WATER:    1. HYDROcodone-acetaminophen (NORCO/VICODIN) 5-325 MG tablet  2. losartan (COZAAR) 100 MG tablet  3. omeprazole (PRILOSEC) 20 MG capsule  4.  5.  6.  ____ Fleet Enema (as directed)   ____ Use CHG Soap as directed  ____ Use inhalers on the day of surgery  _x___ Stop metformin 2 days prior to surgery    ____ Take 1/2 of usual insulin dose the night before  surgery and none on the morning of surgery.   __x__ Stop Coumadin/Plavix/aspirin on 09/28/15  ____ Stop Anti-inflammatories on    ____ Stop supplements until after surgery.    ____ Bring C-Pap to the hospital.

## 2015-09-29 ENCOUNTER — Ambulatory Visit: Payer: PPO | Admitting: Urology

## 2015-10-03 ENCOUNTER — Encounter: Payer: Self-pay | Admitting: *Deleted

## 2015-10-03 ENCOUNTER — Ambulatory Visit: Payer: PPO | Admitting: Anesthesiology

## 2015-10-03 ENCOUNTER — Ambulatory Visit
Admission: RE | Admit: 2015-10-03 | Discharge: 2015-10-03 | Disposition: A | Discharge status: Home / Self Care | Payer: PPO | Source: Ambulatory Visit | Attending: Urology | Admitting: Urology

## 2015-10-03 ENCOUNTER — Encounter
Admission: RE | Disposition: A | Discharge status: Home / Self Care | Payer: Self-pay | Source: Ambulatory Visit | Attending: Urology

## 2015-10-03 DIAGNOSIS — R109 Unspecified abdominal pain: Secondary | ICD-10-CM | POA: Insufficient documentation

## 2015-10-03 DIAGNOSIS — Z833 Family history of diabetes mellitus: Secondary | ICD-10-CM | POA: Insufficient documentation

## 2015-10-03 DIAGNOSIS — K5900 Constipation, unspecified: Secondary | ICD-10-CM | POA: Diagnosis not present

## 2015-10-03 DIAGNOSIS — K219 Gastro-esophageal reflux disease without esophagitis: Secondary | ICD-10-CM | POA: Insufficient documentation

## 2015-10-03 DIAGNOSIS — R310 Gross hematuria: Secondary | ICD-10-CM | POA: Diagnosis not present

## 2015-10-03 DIAGNOSIS — Z87891 Personal history of nicotine dependence: Secondary | ICD-10-CM | POA: Insufficient documentation

## 2015-10-03 DIAGNOSIS — Z7984 Long term (current) use of oral hypoglycemic drugs: Secondary | ICD-10-CM | POA: Insufficient documentation

## 2015-10-03 DIAGNOSIS — N132 Hydronephrosis with renal and ureteral calculous obstruction: Secondary | ICD-10-CM | POA: Insufficient documentation

## 2015-10-03 DIAGNOSIS — Z79899 Other long term (current) drug therapy: Secondary | ICD-10-CM | POA: Insufficient documentation

## 2015-10-03 DIAGNOSIS — E119 Type 2 diabetes mellitus without complications: Secondary | ICD-10-CM | POA: Diagnosis not present

## 2015-10-03 DIAGNOSIS — I10 Essential (primary) hypertension: Secondary | ICD-10-CM | POA: Insufficient documentation

## 2015-10-03 DIAGNOSIS — Z8249 Family history of ischemic heart disease and other diseases of the circulatory system: Secondary | ICD-10-CM | POA: Insufficient documentation

## 2015-10-03 DIAGNOSIS — R509 Fever, unspecified: Secondary | ICD-10-CM | POA: Insufficient documentation

## 2015-10-03 DIAGNOSIS — M1990 Unspecified osteoarthritis, unspecified site: Secondary | ICD-10-CM | POA: Insufficient documentation

## 2015-10-03 DIAGNOSIS — K449 Diaphragmatic hernia without obstruction or gangrene: Secondary | ICD-10-CM | POA: Diagnosis not present

## 2015-10-03 DIAGNOSIS — G4730 Sleep apnea, unspecified: Secondary | ICD-10-CM | POA: Diagnosis not present

## 2015-10-03 DIAGNOSIS — N200 Calculus of kidney: Secondary | ICD-10-CM

## 2015-10-03 DIAGNOSIS — Z7982 Long term (current) use of aspirin: Secondary | ICD-10-CM | POA: Insufficient documentation

## 2015-10-03 DIAGNOSIS — N201 Calculus of ureter: Secondary | ICD-10-CM | POA: Diagnosis not present

## 2015-10-03 HISTORY — PX: CYSTOSCOPY WITH URETEROSCOPY, STONE BASKETRY AND STENT PLACEMENT: SHX6378

## 2015-10-03 LAB — GLUCOSE, CAPILLARY
Glucose-Capillary: 150 mg/dL — ABNORMAL HIGH (ref 65–99)
Glucose-Capillary: 130 mg/dL — ABNORMAL HIGH (ref 65–99)

## 2015-10-03 SURGERY — CYSTOSCOPY, WITH CALCULUS MANIPULATION OR REMOVAL
Anesthesia: General | Site: Ureter | Laterality: Right | Wound class: Clean Contaminated

## 2015-10-03 MED ORDER — CEFAZOLIN SODIUM-DEXTROSE 2-4 GM/100ML-% IV SOLN
INTRAVENOUS | Status: AC
  Filled 2015-10-03: qty 100

## 2015-10-03 MED ORDER — SODIUM CHLORIDE 0.9 % IV SOLN
INTRAVENOUS | Status: DC
  Administered 2015-10-03: 08:00:00 via INTRAVENOUS

## 2015-10-03 MED ORDER — OXYCODONE-ACETAMINOPHEN 5-325 MG PO TABS: 1.0000 | ORAL_TABLET | ORAL | Status: DC | PRN

## 2015-10-03 MED ORDER — ROCURONIUM BROMIDE 100 MG/10ML IV SOLN
INTRAVENOUS | Status: DC | PRN
  Administered 2015-10-03: 50 mg via INTRAVENOUS

## 2015-10-03 MED ORDER — METOCLOPRAMIDE HCL 5 MG/ML IJ SOLN: 10.0000 mg | Freq: Once | INTRAMUSCULAR | Status: DC | PRN

## 2015-10-03 MED ORDER — ACETAMINOPHEN 10 MG/ML IV SOLN
INTRAVENOUS | Status: DC | PRN
  Administered 2015-10-03: 1000 mg via INTRAVENOUS

## 2015-10-03 MED ORDER — MEPERIDINE HCL 25 MG/ML IJ SOLN: 6.2500 mg | INTRAMUSCULAR | Status: DC | PRN

## 2015-10-03 MED ORDER — LIDOCAINE HCL (CARDIAC) 20 MG/ML IV SOLN
INTRAVENOUS | Status: DC | PRN
  Administered 2015-10-03: 100 mg via INTRAVENOUS

## 2015-10-03 MED ORDER — FENTANYL CITRATE (PF) 100 MCG/2ML IJ SOLN
INTRAMUSCULAR | Status: DC | PRN
  Administered 2015-10-03: 200 ug via INTRAVENOUS

## 2015-10-03 MED ORDER — ACETAMINOPHEN 10 MG/ML IV SOLN
INTRAVENOUS | Status: AC
  Filled 2015-10-03: qty 100

## 2015-10-03 MED ORDER — PROPOFOL 10 MG/ML IV BOLUS
INTRAVENOUS | Status: DC | PRN
  Administered 2015-10-03: 150 mg via INTRAVENOUS

## 2015-10-03 MED ORDER — ONDANSETRON HCL 4 MG/2ML IJ SOLN
INTRAMUSCULAR | Status: DC | PRN
  Administered 2015-10-03: 4 mg via INTRAVENOUS

## 2015-10-03 MED ORDER — IOTHALAMATE MEGLUMINE 43 % IV SOLN
INTRAVENOUS | Status: DC | PRN
  Administered 2015-10-03: 20 mL via URETHRAL

## 2015-10-03 MED ORDER — CEFAZOLIN SODIUM-DEXTROSE 2-4 GM/100ML-% IV SOLN
2.0000 g | Freq: Once | INTRAVENOUS | Status: AC
  Administered 2015-10-03: 2 g via INTRAVENOUS

## 2015-10-03 MED ORDER — FENTANYL CITRATE (PF) 100 MCG/2ML IJ SOLN
INTRAMUSCULAR | Status: AC
  Administered 2015-10-03: 25 ug via INTRAVENOUS
  Filled 2015-10-03: qty 2

## 2015-10-03 MED ORDER — FENTANYL CITRATE (PF) 100 MCG/2ML IJ SOLN
25.0000 ug | INTRAMUSCULAR | Status: DC | PRN
  Administered 2015-10-03 (×2): 25 ug via INTRAVENOUS

## 2015-10-03 MED ORDER — SUGAMMADEX SODIUM 200 MG/2ML IV SOLN
INTRAVENOUS | Status: DC | PRN
  Administered 2015-10-03: 250 mg via INTRAVENOUS

## 2015-10-03 MED ORDER — PHENYLEPHRINE HCL 10 MG/ML IJ SOLN
INTRAMUSCULAR | Status: DC | PRN
  Administered 2015-10-03 (×4): 100 ug via INTRAVENOUS

## 2015-10-03 SURGICAL SUPPLY — 40 items
ADAPTER SCOPE UROLOK II (MISCELLANEOUS) IMPLANT
ADH LQ OCL WTPRF AMP STRL LF (MISCELLANEOUS) ×2
ADHESIVE MASTISOL STRL (MISCELLANEOUS) ×3 IMPLANT
ADPR INSRT BALL FIT URLK2 (MISCELLANEOUS)
BAG DRAIN CYSTO-URO LG1000N (MISCELLANEOUS) ×6 IMPLANT
BASKET ZERO TIP 1.9FR (BASKET) ×3 IMPLANT
BSKT STON RTRVL ZERO TP 1.9FR (BASKET) ×1
CATH FOL 2WAY LX 16X5 (CATHETERS) IMPLANT
CATH URETL 5X70 OPEN END (CATHETERS) ×6 IMPLANT
CNTNR SPEC 2.5X3XGRAD LEK (MISCELLANEOUS) ×4
CONRAY 43 FOR UROLOGY 50M (MISCELLANEOUS) ×6 IMPLANT
CONT SPEC 4OZ STER OR WHT (MISCELLANEOUS) ×2
CONT SPEC 4OZ STRL OR WHT (MISCELLANEOUS) ×4
CONTAINER SPEC 2.5X3XGRAD LEK (MISCELLANEOUS) ×4 IMPLANT
DRAPE UTILITY 15X26 TOWEL STRL (DRAPES) ×6 IMPLANT
GLOVE BIO SURGEON STRL SZ 6.5 (GLOVE) ×12 IMPLANT
GOWN STRL REUS W/ TWL LRG LVL3 (GOWN DISPOSABLE) ×4 IMPLANT
GOWN STRL REUS W/ TWL LRG LVL4 (GOWN DISPOSABLE) ×4 IMPLANT
GOWN STRL REUS W/TWL LRG LVL3 (GOWN DISPOSABLE) ×6
GOWN STRL REUS W/TWL LRG LVL4 (GOWN DISPOSABLE) ×4
GUIDEWIRE GREEN .038 145CM (MISCELLANEOUS) ×9 IMPLANT
HOLDER FOLEY CATH W/STRAP (MISCELLANEOUS) IMPLANT
INTRODUCER DILATOR DOUBLE (INTRODUCER) IMPLANT
KIT RM TURNOVER CYSTO AR (KITS) ×6 IMPLANT
LASER FIBER 200M SMARTSCOPE (Laser) IMPLANT
PACK CYSTO AR (MISCELLANEOUS) ×6 IMPLANT
PREP PVP WINGED SPONGE (MISCELLANEOUS) ×6 IMPLANT
PUMP SINGLE ACTION SAP (PUMP) IMPLANT
SENSORWIRE 0.038 NOT ANGLED (WIRE) ×9
SET CYSTO W/LG BORE CLAMP LF (SET/KITS/TRAYS/PACK) ×6 IMPLANT
SHEATH URETERAL 12FRX35CM (MISCELLANEOUS) IMPLANT
SOL .9 NS 3000ML IRR  AL (IV SOLUTION) ×2
SOL .9 NS 3000ML IRR AL (IV SOLUTION) ×4
SOL .9 NS 3000ML IRR UROMATIC (IV SOLUTION) ×4 IMPLANT
STENT URET 6FRX24 CONTOUR (STENTS) IMPLANT
STENT URET 6FRX26 CONTOUR (STENTS) ×3 IMPLANT
SURGILUBE 2OZ TUBE FLIPTOP (MISCELLANEOUS) ×6 IMPLANT
SYRINGE IRR TOOMEY STRL 70CC (SYRINGE) ×3 IMPLANT
WATER STERILE IRR 1000ML POUR (IV SOLUTION) ×6 IMPLANT
WIRE SENSOR 0.038 NOT ANGLED (WIRE) ×6 IMPLANT

## 2015-10-03 NOTE — Discharge Instructions (Signed)
You have a ureteral stent in place.  This is a tube that extends from your kidney to your bladder.  This may cause urinary bleeding, burning with urination, and urinary frequency.  Please call our office or present to the ED if you develop fevers >101 or pain which is not able to be controlled with oral pain medications.  You may be given either Flomax and/ or ditropan to help with bladder spasms and stent pain in addition to pain medications.    Your stent is attached to a string taped to the head of your penis.  You may untape this and remove it on Wednesday.  If you have any issues, please call our office.    [employer] [address], [place], [place] [address] [phone] AMBULATORY SURGERY  DISCHARGE INSTRUCTIONS   1) The drugs that you were given will stay in your system until tomorrow so for the next 24 hours you should not:  A) Drive an automobile B) Make any legal decisions C) Drink any alcoholic beverage   2) You may resume regular meals tomorrow.  Today it is better to start with liquids and gradually work up to solid foods.  You may eat anything you prefer, but it is better to start with liquids, then soup and crackers, and gradually work up to solid foods.   3) Please notify your doctor immediately if you have any unusual bleeding, trouble breathing, redness and pain at the surgery site, drainage, fever, or pain not relieved by medication.    4) Additional Instructions:        Please contact your physician with any problems or Same Day Surgery at [phone], Monday through Friday 6 am to 4 pm, or [place] at [facility] number at [phone].

## 2015-10-03 NOTE — Interval H&P Note (Signed)
History and Physical Interval Note:  10/03/2015 8:26 AM  Ward R Maryan Char.  has presented today for surgery, with the diagnosis of right ureteral stone  The various methods of treatment have been discussed with the patient and family. After consideration of risks, benefits and other options for treatment, the patient has consented to  Procedure(s): URETEROSCOPY WITH HOLMIUM LASER LITHOTRIPSY (Right) CYSTOSCOPY WITH STENT REPLACEMENT (Right) as a surgical intervention .  The patient's history has been reviewed, patient examined, no change in status, stable for surgery.  I have reviewed the patient's chart and labs.  Questions were answered to the patient's satisfaction.    RRR CTAB  Hollice Espy

## 2015-10-03 NOTE — Anesthesia Preprocedure Evaluation (Addendum)
Anesthesia Evaluation  Patient identified by MRN, date of birth, ID band Patient awake    Reviewed: Allergy & Precautions, NPO status , Patient's Chart, lab work & pertinent test results  Airway Mallampati: II  TM Distance: >3 FB Neck ROM: Full    Dental no notable dental hx. (+) Teeth Intact   Pulmonary neg pulmonary ROS, shortness of breath and with exertion, sleep apnea and Continuous Positive Airway Pressure Ventilation , Recent URI , former smoker,    Pulmonary exam normal breath sounds clear to auscultation       Cardiovascular Exercise Tolerance: Good METS: 3 - Mets hypertension, Pt. on medications negative cardio ROS Normal cardiovascular exam Rhythm:Regular Rate:Normal     Neuro/Psych negative neurological ROS  negative psych ROS   GI/Hepatic negative GI ROS, Neg liver ROS, hiatal hernia, GERD  ,  Endo/Other  negative endocrine ROSdiabetes, Type 2  Renal/GU Renal diseasenegative Renal ROS  negative genitourinary   Musculoskeletal negative musculoskeletal ROS (+) Arthritis , Osteoarthritis,    Abdominal (+)  Abdomen: soft.    Peds negative pediatric ROS (+)  Hematology negative hematology ROS (+)   Anesthesia Other Findings Does not use cpap Has lost 25 lb since Thanksgiving  Reproductive/Obstetrics negative OB ROS                            Anesthesia Physical Anesthesia Plan  ASA: II  Anesthesia Plan: General   Post-op Pain Management:    Induction: Intravenous  Airway Management Planned: Oral ETT  Additional Equipment:   Intra-op Plan:   Post-operative Plan: Extubation in OR  Informed Consent:   Plan Discussed with: CRNA  Anesthesia Plan Comments:         Anesthesia Quick Evaluation

## 2015-10-03 NOTE — Transfer of Care (Signed)
Immediate Anesthesia Transfer of Care Note  Patient: Manuel Stafford.  Procedure(s) Performed: Procedure(s): CYSTOSCOPY WITH URETEROSCOPY, STONE BASKETRY AND STENT PLACEMENT (Right)  Patient Location: PACU  Anesthesia Type:General  Level of Consciousness: awake and patient cooperative  Airway & Oxygen Therapy: Patient Spontanous Breathing and Patient connected to nasal cannula oxygen  Post-op Assessment: Report given to RN and Post -op Vital signs reviewed and stable  Post vital signs: Reviewed and stable  Last Vitals:  Filed Vitals:   10/03/15 0752 10/03/15 0930  BP: 127/78 132/65  Pulse: 92 86  Temp: 36.4 C 36.6 C  Resp: 16 17    Complications: No apparent anesthesia complications

## 2015-10-03 NOTE — Op Note (Signed)
Date of procedure: 10/03/2015  Preoperative diagnosis:  1. Right ureteral calculus   Postoperative diagnosis:  1. Same as above   Procedure: 1. Right ureteroscopy 2. Basket extraction of stones 3. Right ureteral stent exchange 4. Right retrograde pyelogram  Surgeon: Hollice Espy, MD  Anesthesia: General  Complications: None  Intraoperative findings: 8 mm right ureteral stone presumably pushed back up into the kidney at the time of stent placement, 3 individual fragments were able to be basket extracted without the need for lithotripsy. Right distal ureteral stone presumably passed with placement of the stent.  EBL: Minimal  Specimens: Stone fragment  Drains: 6 x 26 French double-J ureteral stent on right (string left in place)  Indication: Manuel R Rashid Knopp. is a 70 y.o. patient with with an 8 mm mid ureteral stone as well as a 13 mm right UVJ stone. She was previously taken to the OR for urgent right ureteral stent placement and setting of possible urinary tract infection. He returns today for definitive management of his stone..  After reviewing the management options for treatment, he elected to proceed with the above surgical procedure(s). We have discussed the potential benefits and risks of the procedure, side effects of the proposed treatment, the likelihood of the patient achieving the goals of the procedure, and any potential problems that might occur during the procedure or recuperation. Informed consent has been obtained.  Description of procedure:  The patient was taken to the operating room and general anesthesia was induced.  The patient was placed in the dorsal lithotomy position, prepped and draped in the usual sterile fashion, and preoperative antibiotics were administered. A preoperative time-out was performed.   A rigid 21 French cystoscope was advanced per urethra into the bladder. Of note, there was some narrowing within the fossa navicularis which was  dilated. Attention was turned to the right ureteral orifice from which a ureteral stent was seen emanating. The distal coil of the stent was grasped using stent graspers and brought out to the level of the urethral meatus. A sensor wire was then cannulated through the stent up to level of the kidney leaving the wire in place and the stent was removed. The wire was snapped in place. A semirigid ureteroscope was then advanced alongside the wire into the distal ureter up to level of the iliacs and no stone was encountered. The 13 mm right distal stone presumably passed with placement of the right ureteral stent.  No obvious ureteral calcification could be seen more proximally.  A second wire was then introduced into the ureter using a dual lumen ureteral sheath just within the distal ureter under fluoroscopic guidance. A flexible ureteroscope was then advanced over the working wire up to level of the kidney and no obvious ureteral calcification was seen at the placement of the ureteroscope. Within the kidney, 3 individual fragments were identified presumed early representing the 8 mm stone which was in the mid ureter previously. These appeared to be small enough for basket extraction. A 1.9 French nitinol basket was then used to basket each of these 3 fragments out. The scope was reintroduced each time using a working wire. The ureter was inspected tissue that no residual stone fragments are edema was noted. On the last pass of the scope, the scope was backed to level the proximal ureter and contrast was injected to perform a retrograde pyelogram. This created a roadmap of the kidney ensuring that each calyx had been directly visualized. At this point he was stone  free. The safety wire was then backloaded over a rigid cystoscope and a 6 x 26 French double-J ureteral stent was advanced over the wire up to level of the kidney. The wire was partially drawn until full coil was noted within the renal pelvis. The wire was  then fully withdrawn and a full coil was noted within the bladder. The bladder was then drained. The patient was then cleaned and dried. The stent string was affixed to the patient's glans using Mastisol and Tegaderm. He was then repositioned in supine position, reversed from anesthesia, taken to the PACU in stable condition. Next  Plan: Patient will follow up in 4 weeks with a renal ultrasound prior. He will remove his own stent later this week.  Hollice Espy, M.D.

## 2015-10-03 NOTE — H&P (View-Only) (Signed)
09/26/2015 11:17 PM   Manuel R Maryan Char. February 22, 1946 [id number]  Referring provider: [name], MD [address] [place], [place] [address]  Chief Complaint  Patient presents with  . Nephrolithiasis    pre-op    HPI: Patient is 70 year old Caucasian male who presents today to discuss further treatment for his right ureteral stones.  Patient initially had an episode of gross hematuria associated with right-sided flank pain in February 2017.  A CT scan of the abdomen and pelvis at that time noted a right proximal ureteral stone that measured 7 x 9 mm associated with obstruction.  He then underwent right ESWL for definitive treatment for the right ureteral stone on 09/15/2015.  The next day, patient had intractable right-sided flank pain. He presented to our office and was given an injection of ketorolac 60 mg IM. Patient then stated the pain abated. Unfortunately the pain persisted and he developed constipation and was seen in the emergency room on 09/18/2015.  His WBCs were found to be 17.3.  His creatinine had increased from 1.18 to 1.9.  CT stone study performed that night noted an 8 mm calculus in the mid right ureter and 14 mm  elongated calculus in the distal right ureter at the UVJ associated with moderate right hydronephrosis.  He then underwent emergent right ureteral stent placement with Dr. Erlene Quan.  Today,  he is having intermittent right-sided flank pain. The pain he is describing sounds like normal stent pain.  He is had a low-grade fever, but he denies chills, nausea and vomiting.  PMH: Past Medical History  Diagnosis Date  . Hypertension   . Diabetes mellitus without complication [facility])     Patient takes Metformin.    Surgical History: Past Surgical History  Procedure Laterality Date  . Hernia repair    . Rotator cuff repair Right 06/2012  . Extracorporeal shock wave lithotripsy Right 09/15/2015    Procedure: EXTRACORPOREAL SHOCK WAVE  LITHOTRIPSY (ESWL);  Surgeon: Nickie Retort, MD;  Location: [facility] ORS;  Service: Urology;  Laterality: Right;  . Cystoscopy with stent placement Right 09/18/2015    Procedure: CYSTOSCOPY WITH STENT PLACEMENT;  Surgeon: Hollice Espy, MD;  Location: [facility] ORS;  Service: Urology;  Laterality: Right;    Home Medications:    Medication List       This list is accurate as of: 09/26/15 11:17 PM.  Always use your most recent med list.               ACCU-CHEK SMARTVIEW test strip  Generic drug:  glucose blood  ACCU-CHEK SMARTVIEW (In Vitro Strip)  1 (one) Strip Strip two times daily and as needed for 0 days  Quantity: 200;  Refills: 3   Ordered :15-Jun-2013  [name], MA, [name] ;  Started 15-Jun-2013 Active Comments: Medication taken as needed. Diagnosis code 250.00     aspirin 81 MG tablet  Take by mouth.     BENADRYL ALLERGY 25 mg capsule  Generic drug:  diphenhydrAMINE  Take by mouth.     hydrochlorothiazide 25 MG tablet  Commonly known as:  HYDRODIURIL  Take 1 tablet (25 mg total) by mouth daily.     losartan 100 MG tablet  Commonly known as:  COZAAR  TAKE 1 TABLET BY MOUTH ONCE A DAY     metFORMIN 1000 MG tablet  Commonly known as:  GLUCOPHAGE  Take 1 tablet (1,000 mg total) by mouth 2 (two) times daily with a meal.  MULTIPLE VITAMIN PO  Take by mouth.     omeprazole 20 MG capsule  Commonly known as:  PRILOSEC  Take 1 capsule (20 mg total) by mouth 2 (two) times daily before a meal.     oxybutynin 5 MG tablet  Commonly known as:  DITROPAN  Take 1 tablet (5 mg total) by mouth every 8 (eight) hours as needed for bladder spasms.     oxyCODONE-acetaminophen 5-325 MG tablet  Commonly known as:  PERCOCET  Take 1-2 tablets by mouth every 4 (four) hours as needed for moderate pain or severe pain.     pravastatin 20 MG tablet  Commonly known as:  PRAVACHOL  Take 1 tablet (20 mg total) by mouth daily.     rosuvastatin 20 MG tablet  Commonly known as:   CRESTOR  Take 1 tablet (20 mg total) by mouth daily.     Saw Palmetto Berry 160 MG Caps  Take by mouth.     tamsulosin 0.4 MG Caps capsule  Commonly known as:  FLOMAX  Take 1 capsule (0.4 mg total) by mouth daily.     tiZANidine 4 MG tablet  Commonly known as:  ZANAFLEX  Reported on 09/19/2015     traMADol 50 MG tablet  Commonly known as:  ULTRAM  Take 1 tablet (50 mg total) by mouth at bedtime.     Vitamin D3 2000 units capsule  Take 2,000 Units by mouth daily.        Allergies: No Known Allergies  Family History: Family History  Problem Relation Age of Onset  . Hypertension Mother   . Diabetes Father   . CAD Father   . Diabetes Sister   . Heart disease Sister   . CAD Sister     Social History:  reports that he quit smoking about 41 years ago. His smoking use included Cigarettes. He has a 30 pack-year smoking history. He has never used smokeless tobacco. He reports that he does not drink alcohol or use illicit drugs.  ROS: UROLOGY Frequent Urination?: Yes Hard to postpone urination?: No Burning/pain with urination?: Yes Get up at night to urinate?: Yes Leakage of urine?: No Urine stream starts and stops?: No Trouble starting stream?: No Do you have to strain to urinate?: No Blood in urine?: No Urinary tract infection?: No Sexually transmitted disease?: No Injury to kidneys or bladder?: No Painful intercourse?: No Weak stream?: No Erection problems?: No Penile pain?: No  Gastrointestinal Nausea?: No Vomiting?: No Indigestion/heartburn?: No Diarrhea?: No Constipation?: No  Constitutional Fever: Yes Night sweats?: No Weight loss?: No Fatigue?: No  Skin Skin rash/lesions?: No Itching?: No  Eyes Blurred vision?: No Double vision?: No  Ears/Nose/Throat Sore throat?: No Sinus problems?: No  Hematologic/Lymphatic Swollen glands?: No Easy bruising?: Yes  Cardiovascular Leg swelling?: No Chest pain?: No  Respiratory Cough?:  No Shortness of breath?: No  Endocrine Excessive thirst?: No  Musculoskeletal Back pain?: Yes Joint pain?: No  Neurological Headaches?: No Dizziness?: No  Psychologic Depression?: No Anxiety?: No  Physical Exam: BP 144/80 mmHg  Pulse 92  Ht 6' (1.829 m)  Wt 234 lb (106.142 kg)  BMI 31.73 kg/m2  Constitutional: Well nourished. Alert and oriented, No acute distress. HEENT: [place] AT, moist mucus membranes. Trachea midline, no masses. Cardiovascular: No clubbing, cyanosis, or edema. Respiratory: Normal respiratory effort, no increased work of breathing. GI: Abdomen is soft, non tender, non distended, no abdominal masses. Liver and spleen not palpable.  No hernias appreciated.  Stool sample for  occult testing is not indicated.   GU: No CVA tenderness.  No bladder fullness or masses.   Skin: No rashes, bruises or suspicious lesions. Lymph: No cervical or inguinal adenopathy. Neurologic: Grossly intact, no focal deficits, moving all 4 extremities. Psychiatric: Normal mood and affect.  Laboratory Data: Lab Results  Component Value Date   WBC 17.3* 09/18/2015   HGB 15.5 09/18/2015   HCT 44.0 09/18/2015   MCV 93.9 09/18/2015   PLT 188 09/18/2015    Lab Results  Component Value Date   CREATININE 1.30* 09/19/2015    Lab Results  Component Value Date   PSA 0.3 10/14/2014    Lab Results  Component Value Date   HGBA1C 6.9 06/15/2015    Lab Results  Component Value Date   TSH 2.000 02/22/2015       Component Value Date/Time   CHOL 161 02/22/2015 0902   CHOL 172 07/13/2014   HDL 32* 02/22/2015 0902   HDL 28* 07/13/2014   LDLCALC 95 02/22/2015 0902   LDLCALC 100 07/13/2014    Lab Results  Component Value Date   AST 23 09/18/2015   Lab Results  Component Value Date   ALT 30 09/18/2015    Urinalysis Results for orders placed or performed in visit on 09/26/15  Microscopic Examination  Result Value Ref Range   WBC, UA 6-10 (A) 0 -  5 /hpf   RBC, UA  >30 (A) 0 -  2 /hpf   Epithelial Cells (non renal) >10 (A) 0 - 10 /hpf   Bacteria, UA None seen None seen/Few  Urinalysis, Complete  Result Value Ref Range   Specific Gravity, UA 1.020 1.005 - 1.030   pH, UA 5.5 5.0 - 7.5   Color, UA Yellow Yellow   Appearance Ur Cloudy (A) Clear   Leukocytes, UA Trace (A) Negative   Protein, UA 2+ (A) Negative/Trace   Glucose, UA Negative Negative   Ketones, UA Negative Negative   RBC, UA 3+ (A) Negative   Bilirubin, UA Negative Negative   Urobilinogen, Ur 0.2 0.2 - 1.0 mg/dL   Nitrite, UA Negative Negative   Microscopic Examination See below:     Pertinent Imaging: CLINICAL DATA: Right kidney stones.  EXAM: ABDOMEN - 1 VIEW  COMPARISON: CT scan of September 18, 2015. Radiograph of September 15, 2015.  FINDINGS: The bowel gas pattern is normal. Right-sided ureteral stent is seen in grossly good position. No definite renal or ureteral calculi are seen at this time.  IMPRESSION: Right-sided ureteral stent is in grossly good position. No definite evidence of renal or ureteral calculi seen at this time.   Electronically Signed  By: Marijo Conception, M.D.  On: 09/26/2015 16:17  Assessment & Plan:    Patient will be undergoing a right ureteroscopy  with right laser lithotripsy with right ureteral stent exchange for definitive treatment of an 8 mm right mid ureteral stone and 13 mm of stone debris within the distal right ureter.  1. Right ureteral stones:   Patient will be undergoing right URS/LL/right ureteral stent exchange for definitive treatment of the stones.  I explained to the patient how the procedure is performed and the risks involved.    I informed patient that she will have a stent placed during the procedure and will remain in place after the procedure for a short time.  It will be removed in the office with a cystoscope, unless a string in left in place.  I informed that patient that about 50%  of patients who undergo  ureteroscopy and have a stent will have "stent pain," and this is by far the most common risk/complaint following ureteroscopy. A stent is a soft plastic tube (about half the size of IV tubing) that allows the kidney to drain to the bladder regardless of edema or obstruction. Not only can the stent "rub" on the inside of the bladder, causing a feeling of needing to urinate/overactive bladder, but also the stent allows urine to pass up from the bladder to the kidney during urination - causing symptoms from a warm, tingling sensation to intense pain in the affected flank.   They may be residual stones within the kidney or ureter may be present up to 40% of the time following ureteroscopy, depending on the original stone size and location. These stone fragments will be seen and addressed on follow-up imaging.  Injury to the ureter is the most common intra-operative complication during ureteroscopy. The reported risk of perforation ranges greatly, depending on whether it is defined as a complete perforation (0.1-0.7% - think of this as a hole through the entire ureter), a partial perforation (1.6% - a hole nearly through the entire ureter), or mucosal tear/scrape (5% - these are similar to a sore on the inside of the mouth). Almost 100% of these will heal with prolonged stenting (anywhere between 2 - 4 weeks). Should a large perforation occur, your urologist may chose to stop the procedure and return on another day when the ureter has had time to heal.  I also explained the risks of general anesthesia, such as: MI, CVA, paralysis, coma and/or death.   - Urinalysis, Complete - CULTURE, URINE COMPREHENSIVE  2. Right hydronephrosis:   Patient has a history of right hydronephrosis and will be undergoing manipulation of the right ureter. Once he has completed his definitive treatment for the right ureteral stones and the ureteral stent is removed, he will present in one month for an hour [place] to ensure the  hydronephrosis has resolved.  3. Microscopic hematuria:   Patient has greater than 30 RBCs per prior field on today's UA.  This is associated with ureteral stent and right ureteral stones.  We'll continue to monitor once the patient has definitive treatment for his right ureteral stones and the ureteral stent is removed to ensure the hematuria resolves.   Return for right URS/LL/ right stent exchange.  These notes generated with voice recognition software. I apologize for typographical errors.  Manuel Stafford, [facility] [address] [place], [place] [address] [phone]

## 2015-10-03 NOTE — Anesthesia Postprocedure Evaluation (Signed)
Anesthesia Post Note  Patient: Manuel Stafford.  Procedure(s) Performed: Procedure(s) (LRB): CYSTOSCOPY WITH URETEROSCOPY, STONE BASKETRY AND STENT PLACEMENT (Right)  Patient location during evaluation: PACU Anesthesia Type: General Level of consciousness: awake and alert Pain management: pain level controlled Vital Signs Assessment: post-procedure vital signs reviewed and stable Respiratory status: spontaneous breathing, nonlabored ventilation, respiratory function stable and patient connected to nasal cannula oxygen Cardiovascular status: blood pressure returned to baseline and stable Postop Assessment: no signs of nausea or vomiting Anesthetic complications: no    Last Vitals:  Filed Vitals:   10/03/15 1040 10/03/15 1123  BP: 129/72 112/66  Pulse: 84 79  Temp: 36.8 C   Resp: 16 16    Last Pain:  Filed Vitals:   10/03/15 1124  PainSc: 0-No pain                 Wilmer Floor

## 2015-10-03 NOTE — OR Nursing (Signed)
Patient had some blood flush out of his urethra when transferring to chair from the stretcher. Stent string is on the tip of his penis and well taped with opsite.

## 2015-10-03 NOTE — Anesthesia Procedure Notes (Signed)
Procedure Name: Intubation Date/Time: 10/03/2015 8:41 AM Performed by: Rosaria Ferries, Rc Amison Pre-anesthesia Checklist: Patient identified, Suction available, Patient being monitored and Emergency Drugs available Patient Re-evaluated:Patient Re-evaluated prior to inductionOxygen Delivery Method: Circle system utilized Preoxygenation: Pre-oxygenation with 100% oxygen Intubation Type: IV induction Laryngoscope Size: Mac and 3 Grade View: Grade III Number of attempts: 1 Airway Equipment and Method: Bougie stylet

## 2015-10-04 ENCOUNTER — Telehealth: Payer: Self-pay | Admitting: Urology

## 2015-10-11 LAB — STONE ANALYSIS
Ca Oxalate,Monohydr.: 65 %
Ca phos cry stone ql IR: 5 %
Stone Weight KSTONE: 21 mg
Uric Acid: 30 %

## 2015-10-17 ENCOUNTER — Telehealth: Payer: Self-pay | Admitting: Family Medicine

## 2015-10-17 ENCOUNTER — Ambulatory Visit (INDEPENDENT_AMBULATORY_CARE_PROVIDER_SITE_OTHER): Payer: PPO | Admitting: Family Medicine

## 2015-10-17 VITALS — BP 122/70 | HR 72 | Temp 98.0°F | Resp 16 | Wt 240.0 lb

## 2015-10-17 DIAGNOSIS — E119 Type 2 diabetes mellitus without complications: Secondary | ICD-10-CM | POA: Diagnosis not present

## 2015-10-17 DIAGNOSIS — E785 Hyperlipidemia, unspecified: Secondary | ICD-10-CM | POA: Diagnosis not present

## 2015-10-17 MED ORDER — GLUCOSE BLOOD VI STRP: 1.0000 | ORAL_STRIP | Freq: Two times a day (BID) | Status: DC | PRN

## 2015-10-17 NOTE — Telephone Encounter (Signed)
Pt called saying Jiles Garter sent the Accu-chek test strips but insurance will only pay for One touch.  Please send in the one Touch.  He said he would bring the Accu check back.  He uses [facility]  [employer]

## 2015-10-17 NOTE — Progress Notes (Signed)
Patient ID: Manuel Stafford., male   DOB: 08-08-1945, 70 y.o.   MRN: [id number]   Manuel Stafford.  MRN: [id number] DOB: 1946/02/05  Subjective:  HPI   The patient is a 70 year old male who presents for follow up of his hyperlipidemia and diabetes.  The patient was seen on 08/24/15 for his cholesterol.  At that time he was told to finish his Pravastatin and then start of the Crestor.  He states he has been on the Crestor for 2 weeks now and has not had any adverse effects.   He is also here to have his A1C done.  His last A1C was in December 2016 and it was 6.9.  The patient checks his glucose at home and states it has been running in the 120s-130s.  He states he is needing a new meter and supplies.   Patient Active Problem List   Diagnosis Date Noted  . Hydronephrosis with urinary obstruction due to ureteral calculus 09/26/2015  . Microscopic hematuria 09/26/2015  . Hypertensive left ventricular hypertrophy 09/13/2015  . Nephrolithiasis 08/29/2015  . Right ureteral stone 08/29/2015  . Abnormal ECG 08/22/2015  . Benign essential HTN 08/22/2015  . Combined fat and carbohydrate induced hyperlipemia 08/22/2015  . Breathlessness on exertion 08/22/2015  . Lung nodules 04/28/2015  . Allergic rhinitis 12/31/2014  . Diabetes ([place]) 12/31/2014  . Failure of erection 12/31/2014  . Fatty liver disease, nonalcoholic [id number]  . Acid reflux 12/31/2014  . BP (high blood pressure) 12/31/2014  . HLD (hyperlipidemia) 12/31/2014  . Lumbosacral spondylosis without myelopathy 12/31/2014  . Adiposity 12/31/2014  . Herpes zona 12/31/2014  . Disorder of shoulder 12/31/2014  . Apnea, sleep 12/31/2014  . Avitaminosis D 12/31/2014  . DDD (degenerative disc disease), lumbar 11/30/2013    Past Medical History  Diagnosis Date  . Hypertension   . Diabetes mellitus without complication [facility])     Patient takes Metformin.  Manuel Stafford GERD (gastroesophageal reflux disease)   . Back pain     lower  .  Elevated lipids     Social History   Social History  . Marital Status: Married    Spouse Name: N/A  . Number of Children: N/A  . Years of Education: N/A   Occupational History  . Not on file.   Social History Main Topics  . Smoking status: Former Smoker -- 2.00 packs/day for 15 years    Types: Cigarettes    Quit date: 06/25/1974  . Smokeless tobacco: Never Used  . Alcohol Use: No  . Drug Use: No  . Sexual Activity: Not Currently   Other Topics Concern  . Not on file   Social History Narrative    Outpatient Prescriptions Prior to Visit  Medication Sig Dispense Refill  . aspirin 81 MG tablet Take by mouth.    . Cholecalciferol (VITAMIN D3) 2000 UNITS capsule Take 2,000 Units by mouth daily.    . diphenhydrAMINE (BENADRYL ALLERGY) 25 mg capsule Take by mouth.    Manuel Stafford glucose blood (ACCU-CHEK SMARTVIEW) test strip ACCU-CHEK SMARTVIEW (In Vitro Strip)  1 (one) Strip Strip two times daily and as needed for 0 days  Quantity: 200;  Refills: 3   Ordered :15-Jun-2013  Manuel Kras, MA, Manuel Stafford ;  Started 15-Jun-2013 Active Comments: Medication taken as needed. Diagnosis code 250.00    . hydrochlorothiazide (HYDRODIURIL) 25 MG tablet Take 1 tablet (25 mg total) by mouth daily. 90 tablet 3  . HYDROcodone-acetaminophen (NORCO/VICODIN) 5-325 MG tablet Take 1 tablet by  mouth every 6 (six) hours as needed for moderate pain.    Manuel Stafford losartan (COZAAR) 100 MG tablet TAKE 1 TABLET BY MOUTH ONCE A DAY 90 tablet 3  . metFORMIN (GLUCOPHAGE) 1000 MG tablet Take 1 tablet (1,000 mg total) by mouth 2 (two) times daily with a meal. 180 tablet 3  . MULTIPLE VITAMIN PO Take by mouth.    Manuel Stafford omeprazole (PRILOSEC) 20 MG capsule Take 1 capsule (20 mg total) by mouth 2 (two) times daily before a meal. 180 capsule 3  . oxybutynin (DITROPAN) 5 MG tablet Take 1 tablet (5 mg total) by mouth every 8 (eight) hours as needed for bladder spasms. 30 tablet 0  . oxyCODONE-acetaminophen (PERCOCET) 5-325 MG tablet Take  1-2 tablets by mouth every 4 (four) hours as needed for moderate pain or severe pain. 15 tablet 0  . Saw Palmetto, Serenoa repens, (SAW PALMETTO BERRY) 160 MG CAPS Take by mouth.    . tamsulosin (FLOMAX) 0.4 MG CAPS capsule Take 1 capsule (0.4 mg total) by mouth daily. (Patient taking differently: Take 0.4 mg by mouth daily after supper. ) 30 capsule 0  . tiZANidine (ZANAFLEX) 4 MG tablet Take 4 mg by mouth at bedtime. Reported on 09/19/2015    . Cholecalciferol (VITAMIN D3) 2000 units TABS Take by mouth.    . pravastatin (PRAVACHOL) 20 MG tablet Take 1 tablet (20 mg total) by mouth daily. 90 tablet 3   No facility-administered medications prior to visit.    No Known Allergies  Review of Systems  Constitutional: Negative for fever and malaise/fatigue.  Respiratory: Negative for cough, shortness of breath and wheezing.   Cardiovascular: Negative for chest pain, palpitations, orthopnea and leg swelling.  Genitourinary: Negative for frequency.  Neurological: Positive for weakness (Patient feels is may be secondary to recent anesthesia ttwice). Negative for dizziness and headaches.  Endo/Heme/Allergies: Negative for polydipsia.   Objective:  BP 122/70 mmHg  Pulse 72  Temp(Src) 98 F (36.7 C) (Oral)  Resp 16  Wt 240 lb (108.863 kg)  Physical Exam  Constitutional: He is oriented to person, place, and time and well-developed, well-nourished, and in no distress.  HENT:  Head: Normocephalic and atraumatic.  Right Ear: External ear normal.  Left Ear: External ear normal.  Nose: Nose normal.  Eyes: Conjunctivae are normal. Pupils are equal, round, and reactive to light.  Neck: Normal range of motion.  Cardiovascular: Normal rate, regular rhythm and normal heart sounds.   Pulmonary/Chest: Effort normal and breath sounds normal.  Abdominal: Soft.  Neurological: He is alert and oriented to person, place, and time. Gait normal.  Skin: Skin is warm and dry.  Psychiatric: Mood, memory, affect  and judgment normal.    Assessment and Plan :  No diagnosis found. 1. Hyperlipidemia  - Lipid Panel With LDL/HDL Ratio - Comprehensive metabolic panel  2. Type 2 diabetes mellitus without complication, without long-term current use of insulin (HCC)  - Comprehensive metabolic panel - Hemoglobin A1c - glucose blood test strip; 1 each by Other route 2 (two) times daily as needed for other. Use as instructed  Dispense: 100 each; Refill: [facility] 10/17/2015 8:20 AM

## 2015-10-19 DIAGNOSIS — E785 Hyperlipidemia, unspecified: Secondary | ICD-10-CM | POA: Diagnosis not present

## 2015-10-19 DIAGNOSIS — E119 Type 2 diabetes mellitus without complications: Secondary | ICD-10-CM | POA: Diagnosis not present

## 2015-10-20 LAB — LIPID PANEL WITH LDL/HDL RATIO
Cholesterol, Total: 116 mg/dL (ref 100–199)
HDL: 28 mg/dL — ABNORMAL LOW (ref >39)
LDL Calculated: 65 mg/dL (ref 0–99)
LDl/HDL Ratio: 2.3 ratio units (ref 0.0–3.6)
Triglycerides: 117 mg/dL (ref 0–149)
VLDL CHOLESTEROL CAL: 23 mg/dL (ref 5–40)

## 2015-10-20 LAB — COMPREHENSIVE METABOLIC PANEL
ALBUMIN: 4.3 g/dL (ref 3.5–4.8)
ALK PHOS: 104 IU/L (ref 39–117)
ALT: 35 IU/L (ref 0–44)
AST: 31 IU/L (ref 0–40)
Albumin/Globulin Ratio: 1.6 (ref 1.2–2.2)
BUN / CREAT RATIO: 14 (ref 10–24)
BUN: 17 mg/dL (ref 8–27)
Bilirubin Total: 0.5 mg/dL (ref 0.0–1.2)
CO2: 23 mmol/L (ref 18–29)
CREATININE: 1.23 mg/dL (ref 0.76–1.27)
Calcium: 9.9 mg/dL (ref 8.6–10.2)
Chloride: 99 mmol/L (ref 96–106)
GFR, EST AFRICAN AMERICAN: 68 mL/min/{1.73_m2} (ref >59)
GFR, EST NON AFRICAN AMERICAN: 59 mL/min/{1.73_m2} — AB (ref >59)
GLOBULIN, TOTAL: 2.7 g/dL (ref 1.5–4.5)
Glucose: 148 mg/dL — ABNORMAL HIGH (ref 65–99)
Potassium: 4.4 mmol/L (ref 3.5–5.2)
SODIUM: 140 mmol/L (ref 134–144)
TOTAL PROTEIN: 7 g/dL (ref 6.0–8.5)

## 2015-10-20 LAB — HEMOGLOBIN A1C
Est. average glucose Bld gHb Est-mCnc: 148 mg/dL
HEMOGLOBIN A1C: 6.8 % — AB (ref 4.8–5.6)

## 2015-10-25 ENCOUNTER — Ambulatory Visit
Admission: RE | Admit: 2015-10-25 | Discharge: 2015-10-25 | Disposition: A | Discharge status: Home / Self Care | Payer: PPO | Source: Ambulatory Visit | Attending: Urology | Admitting: Urology

## 2015-10-25 DIAGNOSIS — N200 Calculus of kidney: Secondary | ICD-10-CM

## 2015-10-25 DIAGNOSIS — Z87442 Personal history of urinary calculi: Secondary | ICD-10-CM | POA: Diagnosis not present

## 2015-10-31 DIAGNOSIS — M5416 Radiculopathy, lumbar region: Secondary | ICD-10-CM | POA: Diagnosis not present

## 2015-10-31 DIAGNOSIS — M5136 Other intervertebral disc degeneration, lumbar region: Secondary | ICD-10-CM | POA: Diagnosis not present

## 2015-10-31 DIAGNOSIS — M4726 Other spondylosis with radiculopathy, lumbar region: Secondary | ICD-10-CM | POA: Diagnosis not present

## 2015-11-01 ENCOUNTER — Ambulatory Visit (INDEPENDENT_AMBULATORY_CARE_PROVIDER_SITE_OTHER): Payer: PPO | Admitting: Urology

## 2015-11-01 ENCOUNTER — Other Ambulatory Visit: Payer: Self-pay | Admitting: Family Medicine

## 2015-11-01 ENCOUNTER — Encounter: Payer: Self-pay | Admitting: Urology

## 2015-11-01 VITALS — BP 147/89 | HR 90 | Ht 72.0 in | Wt 236.0 lb

## 2015-11-01 DIAGNOSIS — N200 Calculus of kidney: Secondary | ICD-10-CM | POA: Diagnosis not present

## 2015-11-01 DIAGNOSIS — Z87442 Personal history of urinary calculi: Secondary | ICD-10-CM | POA: Diagnosis not present

## 2015-11-01 LAB — URINALYSIS, COMPLETE
BILIRUBIN UA: NEGATIVE
Glucose, UA: NEGATIVE
Ketones, UA: NEGATIVE
LEUKOCYTES UA: NEGATIVE
Nitrite, UA: NEGATIVE
PH UA: 5.5 (ref 5.0–7.5)
Protein, UA: NEGATIVE
RBC UA: NEGATIVE
Specific Gravity, UA: 1.01 (ref 1.005–1.030)
Urobilinogen, Ur: 0.2 mg/dL (ref 0.2–1.0)

## 2015-11-01 LAB — MICROSCOPIC EXAMINATION
BACTERIA UA: NONE SEEN
EPITHELIAL CELLS (NON RENAL): NONE SEEN /HPF (ref 0–10)
RBC, UA: NONE SEEN /hpf (ref 0–?)
WBC, UA: NONE SEEN /hpf (ref 0–?)

## 2015-11-01 MED ORDER — ROSUVASTATIN CALCIUM 20 MG PO TABS
20.0000 mg | ORAL_TABLET | Freq: Every day | ORAL | Status: DC
Start: 2015-11-01 — End: 2016-09-29

## 2015-11-01 NOTE — Telephone Encounter (Signed)
Patient would like a 90 day supply of rosuvastatin (CRESTOR) 20 MG tablet sent to [facility].

## 2015-11-01 NOTE — Telephone Encounter (Signed)
Done-aa 

## 2015-11-01 NOTE — Progress Notes (Signed)
11/01/2015 3:05 PM   Manuel R Maryan Char. July 15, 1945 [id number]  Referring provider: [name], MD [address] [place], [place] [address]  Chief Complaint  Patient presents with  . Results    HPI: 70 year old male with 40mm mid and 13 mm right UVJ stone who was taken urgently to the OR for ureteral stent placement in the setting of possible infection.  Recently, he returned to the operating room on 10/03/2015 for definitive management of his stones. Surgery was uncomplicated and he removed his own stent a few days later. Follow-up renal ultrasound is negative for any residual stone fragments or hydronephrosis.  Today, he has no complaints. He did pass a few small clots in the week following surgery but has had none since that time. He denies hematuria or dysuria.  No flank pain.  Stone analysis shows 65% calcium oxalate, 5% calcium phosphate, 30% uric acid.  Prior to this episode, he has no history of nephrolithiasis. He does admit that he does not drink enough water and didn't drink diet Coke/protein a regular basis prior to the stone episode. He has no personal history of gout.   PMH: Past Medical History  Diagnosis Date  . Hypertension   . Diabetes mellitus without complication [facility])     Patient takes Metformin.  [name] GERD (gastroesophageal reflux disease)   . Back pain     lower  . Elevated lipids     Surgical History: Past Surgical History  Procedure Laterality Date  . Hernia repair    . Rotator cuff repair Right 06/2012  . Extracorporeal shock wave lithotripsy Right 09/15/2015    Procedure: EXTRACORPOREAL SHOCK WAVE LITHOTRIPSY (ESWL);  Surgeon: Nickie Retort, MD;  Location: [facility] ORS;  Service: Urology;  Laterality: Right;  . Cystoscopy with stent placement Right 09/18/2015    Procedure: CYSTOSCOPY WITH STENT PLACEMENT;  Surgeon: Hollice Espy, MD;  Location: [facility] ORS;  Service: Urology;  Laterality: Right;  . Circumcision    . Cystoscopy  with ureteroscopy, stone basketry and stent placement Right 10/03/2015    Procedure: CYSTOSCOPY WITH URETEROSCOPY, [place];  Surgeon: Hollice Espy, MD;  Location: [facility] ORS;  Service: Urology;  Laterality: Right;    Home Medications:    Medication List       This list is accurate as of: 11/01/15  3:05 PM.  Always use your most recent med list.               aspirin 81 MG tablet  Take by mouth.     BENADRYL ALLERGY 25 mg capsule  Generic drug:  diphenhydrAMINE  Take by mouth.     glucose blood test strip  1 each by Other route 2 (two) times daily as needed for other. Use as instructed     hydrochlorothiazide 25 MG tablet  Commonly known as:  HYDRODIURIL  Take 1 tablet (25 mg total) by mouth daily.     losartan 100 MG tablet  Commonly known as:  COZAAR  TAKE 1 TABLET BY MOUTH ONCE A DAY     metFORMIN 1000 MG tablet  Commonly known as:  GLUCOPHAGE  Take 1 tablet (1,000 mg total) by mouth 2 (two) times daily with a meal.     MULTIPLE VITAMIN PO  Take by mouth.     omeprazole 20 MG capsule  Commonly known as:  PRILOSEC  Take 1 capsule (20 mg total) by mouth 2 (two) times daily before a meal.  rosuvastatin 20 MG tablet  Commonly known as:  CRESTOR  Take 1 tablet (20 mg total) by mouth daily.     Saw Palmetto Berry 160 MG Caps  Take by mouth.     tiZANidine 4 MG tablet  Commonly known as:  ZANAFLEX  Take 4 mg by mouth at bedtime. Reported on 09/19/2015     Vitamin D3 2000 units capsule  Take 2,000 Units by mouth daily.        Allergies: No Known Allergies  Family History: Family History  Problem Relation Age of Onset  . Hypertension Mother   . Diabetes Father   . CAD Father   . Diabetes Sister   . Heart disease Sister   . CAD Sister     Social History:  reports that he quit smoking about 41 years ago. His smoking use included Cigarettes. He has a 30 pack-year smoking history. He has never used smokeless tobacco. He reports  that he does not drink alcohol or use illicit drugs.  ROS: UROLOGY Frequent Urination?: No Hard to postpone urination?: No Burning/pain with urination?: No Get up at night to urinate?: No Leakage of urine?: No Urine stream starts and stops?: No Trouble starting stream?: No Do you have to strain to urinate?: No Blood in urine?: No Urinary tract infection?: No Sexually transmitted disease?: No Injury to kidneys or bladder?: No Painful intercourse?: No Weak stream?: No Erection problems?: No Penile pain?: No  Gastrointestinal Nausea?: No Vomiting?: No Indigestion/heartburn?: No Diarrhea?: No Constipation?: No  Constitutional Fever: No Night sweats?: No Weight loss?: No Fatigue?: No  Skin Skin rash/lesions?: No Itching?: No  Eyes Blurred vision?: No Double vision?: No  Ears/Nose/Throat Sore throat?: No Sinus problems?: No  Hematologic/Lymphatic Swollen glands?: No Easy bruising?: No  Cardiovascular Leg swelling?: No Chest pain?: No  Respiratory Cough?: No Shortness of breath?: No  Endocrine Excessive thirst?: No  Musculoskeletal Back pain?: No Joint pain?: No  Neurological Headaches?: No Dizziness?: No  Psychologic Depression?: No Anxiety?: No  Physical Exam: BP 147/89 mmHg  Pulse 90  Ht 6' (1.829 m)  Wt 236 lb (107.049 kg)  BMI 32.00 kg/m2  Constitutional:  Alert and oriented, No acute distress. HEENT: [place] AT, moist mucus membranes.  Trachea midline, no masses. Cardiovascular: No clubbing, cyanosis, or edema. Respiratory: Normal respiratory effort, no increased work of breathing. GI: Abdomen is soft, nontender, nondistended, no abdominal masses GU: No CVA tenderness.  Skin: No rashes, bruises or suspicious lesions. Neurologic: Grossly intact, no focal deficits, moving all 4 extremities. Psychiatric: Normal mood and affect.  Laboratory Data: Lab Results  Component Value Date   WBC 8.7 09/28/2015   HGB 14.8 09/28/2015   HCT 43.0  09/28/2015   MCV 96.7 09/28/2015   PLT 197 09/28/2015    Lab Results  Component Value Date   CREATININE 1.23 10/19/2015    Lab Results  Component Value Date   PSA 0.3 10/14/2014    Lab Results  Component Value Date   HGBA1C 6.8* 10/19/2015    Urinalysis UA/microscopic reviewed today, no evidence of infection or residual microscopic hematuria.  Pertinent Imaging: Study Result     CLINICAL DATA: History of renal stones. Procedure 3 weeks ago.  EXAM: RENAL / URINARY TRACT ULTRASOUND COMPLETE  COMPARISON: 09/18/2015  FINDINGS: Right Kidney:  Length: 12.1 cm. Normal renal echogenicity. No focal mass or hydronephrosis.  Left Kidney:  Length: 11.3 cm. Normal renal echogenicity. No focal mass or hydronephrosis.  Bladder:  Appears normal for degree of  bladder distention. Bilateral ureteral jets are present.  IMPRESSION: Normal renal ultrasound.   Electronically Signed  By: Nolon Nations M.D.  On: 10/25/2015 14:20      Assessment & Plan:    1. History of nephrolithiasis Status post uncomplicated right ureteroscopy, follow-up imaging negative for signs of hydronephrosis or residual fragments  We discussed general stone prevention techniques including drinking plenty water with goal of producing 2.5 L urine daily, increased citric acid intake, avoidance of high oxalate containing foods, and decreased salt intake.  Information about dietary recommendations given today.   In addition, he was given a handout on low. Diet and advised to follow-up with his PCP if he has any joint pain or signs or symptoms of gout.  Given that this is his first episode of kidney stones, but about workup is not warranted at this time.  We'll follow up on an as-needed basis.  - Urinalysis, Complete  Hollice Espy, MD  [facility] [address] [place], [place] [address] [phone]  I spent 15 min with this  patient of which greater than 50% was spent in counseling and coordination of care with the patient.

## 2015-11-15 NOTE — Telephone Encounter (Signed)
error 

## 2016-01-20 DIAGNOSIS — H2513 Age-related nuclear cataract, bilateral: Secondary | ICD-10-CM | POA: Diagnosis not present

## 2016-01-20 DIAGNOSIS — Z7984 Long term (current) use of oral hypoglycemic drugs: Secondary | ICD-10-CM | POA: Diagnosis not present

## 2016-01-20 DIAGNOSIS — E119 Type 2 diabetes mellitus without complications: Secondary | ICD-10-CM | POA: Diagnosis not present

## 2016-02-10 ENCOUNTER — Encounter: Payer: Self-pay | Admitting: Family Medicine

## 2016-02-16 ENCOUNTER — Ambulatory Visit: Payer: PPO | Admitting: Family Medicine

## 2016-03-09 ENCOUNTER — Other Ambulatory Visit: Payer: Self-pay | Admitting: Family Medicine

## 2016-03-09 NOTE — Telephone Encounter (Signed)
Dr. Gilbert's pt.   Thanks,   -Gemini Bunte  

## 2016-03-14 DIAGNOSIS — E782 Mixed hyperlipidemia: Secondary | ICD-10-CM | POA: Diagnosis not present

## 2016-03-14 DIAGNOSIS — K219 Gastro-esophageal reflux disease without esophagitis: Secondary | ICD-10-CM | POA: Diagnosis not present

## 2016-03-14 DIAGNOSIS — I10 Essential (primary) hypertension: Secondary | ICD-10-CM | POA: Diagnosis not present

## 2016-03-21 ENCOUNTER — Ambulatory Visit (INDEPENDENT_AMBULATORY_CARE_PROVIDER_SITE_OTHER): Payer: PPO | Admitting: Family Medicine

## 2016-03-21 ENCOUNTER — Encounter: Payer: Self-pay | Admitting: Family Medicine

## 2016-03-21 VITALS — BP 142/78 | HR 76 | Temp 97.8°F | Resp 16 | Wt 237.0 lb

## 2016-03-21 DIAGNOSIS — E119 Type 2 diabetes mellitus without complications: Secondary | ICD-10-CM

## 2016-03-21 DIAGNOSIS — E785 Hyperlipidemia, unspecified: Secondary | ICD-10-CM

## 2016-03-21 DIAGNOSIS — K219 Gastro-esophageal reflux disease without esophagitis: Secondary | ICD-10-CM

## 2016-03-21 DIAGNOSIS — R300 Dysuria: Secondary | ICD-10-CM

## 2016-03-21 DIAGNOSIS — M25519 Pain in unspecified shoulder: Secondary | ICD-10-CM

## 2016-03-21 DIAGNOSIS — M5136 Other intervertebral disc degeneration, lumbar region: Secondary | ICD-10-CM | POA: Diagnosis not present

## 2016-03-21 DIAGNOSIS — Z23 Encounter for immunization: Secondary | ICD-10-CM | POA: Insufficient documentation

## 2016-03-21 DIAGNOSIS — I10 Essential (primary) hypertension: Secondary | ICD-10-CM

## 2016-03-21 DIAGNOSIS — M51369 Other intervertebral disc degeneration, lumbar region without mention of lumbar back pain or lower extremity pain: Secondary | ICD-10-CM

## 2016-03-21 LAB — POCT URINALYSIS DIPSTICK
Bilirubin, UA: NEGATIVE
GLUCOSE UA: NEGATIVE
Ketones, UA: NEGATIVE
Leukocytes, UA: NEGATIVE
NITRITE UA: NEGATIVE
PH UA: 6
Protein, UA: NEGATIVE
RBC UA: NEGATIVE
SPEC GRAV UA: 1.025
UROBILINOGEN UA: 0.2

## 2016-03-21 LAB — POCT GLYCOSYLATED HEMOGLOBIN (HGB A1C)
ESTIMATED AVERAGE GLUCOSE: 154
HEMOGLOBIN A1C: 7

## 2016-03-21 MED ORDER — TRAMADOL HCL 50 MG PO TABS: 50.0000 mg | ORAL_TABLET | Freq: Every day | ORAL | 2 refills | Status: DC

## 2016-03-21 MED ORDER — PREDNISONE 10 MG (21) PO TBPK: 10.0000 mg | ORAL_TABLET | Freq: Every day | ORAL | 1 refills | Status: DC

## 2016-03-21 MED ORDER — SILDENAFIL CITRATE 20 MG PO TABS: 20.0000 mg | ORAL_TABLET | Freq: Three times a day (TID) | ORAL | 12 refills | Status: DC

## 2016-03-21 MED ORDER — OMEPRAZOLE 20 MG PO CPDR: 20.0000 mg | DELAYED_RELEASE_CAPSULE | Freq: Every day | ORAL | 3 refills | Status: DC

## 2016-03-21 NOTE — Patient Instructions (Signed)
Decrease Omeprazole to 1 tablet daily. Try to cut out 1 tablet a week next month, 2 tablets per week the following month, etc. Also elevate the head of the bed.

## 2016-03-21 NOTE — Progress Notes (Signed)
Patient: Manuel Stafford. Male    DOB: 1946/04/12   70 y.o.   MRN: [id number] Visit Date: 03/21/2016  Today's Provider: [name], MD   Chief Complaint  Patient presents with  . Diabetes  . Hypertension  . Hyperlipidemia   Subjective:    HPI   Diabetes Mellitus Type II, Follow-up:   Lab Results  Component Value Date   HGBA1C 6.8 (H) 10/19/2015   HGBA1C 6.9 06/15/2015   HGBA1C 8.5 (H) 02/22/2015   Last seen for diabetes 5 months ago.  Management since then includes none. He reports excellent compliance with treatment. He is not having side effects.  Current symptoms include paresthesia of the feet and have been unchanged. Home blood sugar records: increasing per pt; as high as 189 fasting  Episodes of hypoglycemia? no   Most Recent Eye Exam: yesterday with [facility] Weight trend: fluctuating a bit Prior visit with dietician: did see [facility] Current diet: in general, a "healthy" diet   Current exercise: walking at least one mile every morning  ------------------------------------------------------------------------   Hypertension, follow-up:  BP Readings from Last 3 Encounters:  03/21/16 (!) 142/78  11/01/15 (!) 147/89  10/17/15 122/70    He was last seen for hypertension 5 months ago.  BP at that visit was 122/70. Management since that visit includes none.He reports excellent compliance with treatment. He is not having side effects.  He is exercising. He is adherent to low salt diet.   Outside blood pressures are getting higher per pt. Did see Dr. Nehemiah Massed last week (03/14/2016), who changed pt's Losartan to Telmisartan. He is experiencing none.  Patient denies chest pain, claudication, dyspnea, fatigue, irregular heart beat, lower extremity edema, near-syncope, orthopnea, palpitations and syncope.   Cardiovascular risk factors include advanced age (older than 3 for men, 45 for women), diabetes mellitus,  dyslipidemia, family history of premature cardiovascular disease, hypertension and male gender.   ------------------------------------------------------------------------    Lipid/Cholesterol, Follow-up:   Last seen for this 5 months ago.  Management since that visit includes none.  Last Lipid Panel:    Component Value Date/Time   CHOL 116 10/19/2015 0805   TRIG 117 10/19/2015 0805   HDL 28 (L) 10/19/2015 0805   LDLCALC 65 10/19/2015 0805    He reports excellent compliance with treatment. He is not having side effects.   Wt Readings from Last 3 Encounters:  03/21/16 237 lb (107.5 kg)  11/01/15 236 lb (107 kg)  10/17/15 240 lb (108.9 kg)    ------------------------------------------------------------------------      No Known Allergies   Current Outpatient Prescriptions:  .  aspirin 81 MG tablet, Take by mouth., Disp: , Rfl:  .  Cholecalciferol (VITAMIN D3) 2000 UNITS capsule, Take 2,000 Units by mouth daily., Disp: , Rfl:  .  diphenhydrAMINE (BENADRYL ALLERGY) 25 mg capsule, Take by mouth., Disp: , Rfl:  .  glucose blood test strip, 1 each by Other route 2 (two) times daily as needed for other. Use as instructed, Disp: 100 each, Rfl: 12 .  hydrochlorothiazide (HYDRODIURIL) 25 MG tablet, Take 1 tablet (25 mg total) by mouth daily., Disp: 90 tablet, Rfl: 3 .  metFORMIN (GLUCOPHAGE) 1000 MG tablet, Take 1 tablet (1,000 mg total) by mouth 2 (two) times daily with a meal., Disp: 180 tablet, Rfl: 3 .  MULTIPLE VITAMIN PO, Take by mouth., Disp: , Rfl:  .  omeprazole (PRILOSEC) 20 MG capsule, Take 1 capsule (20 mg total) by  mouth 2 (two) times daily before a meal., Disp: 180 capsule, Rfl: 3 .  rosuvastatin (CRESTOR) 20 MG tablet, Take 1 tablet (20 mg total) by mouth daily., Disp: 90 tablet, Rfl: 3 .  Saw Palmetto, Serenoa repens, (SAW PALMETTO BERRY) 160 MG CAPS, Take by mouth., Disp: , Rfl:  .  telmisartan (MICARDIS) 40 MG tablet, Take 1 tablet by mouth daily., Disp: , Rfl:    .  tiZANidine (ZANAFLEX) 4 MG tablet, Take 4 mg by mouth at bedtime. Reported on 09/19/2015, Disp: , Rfl:  .  traMADol (ULTRAM) 50 MG tablet, TAKE 1 TABLET BY MOUTH AT BEDTIME, Disp: 90 tablet, Rfl: 2  Review of Systems  Constitutional: Negative for activity change, appetite change, chills, diaphoresis, fatigue, fever and unexpected weight change.  Eyes: Negative.   Respiratory: Negative for cough and shortness of breath.   Cardiovascular: Negative for chest pain, palpitations and leg swelling.  Endocrine: Negative.   Genitourinary: Positive for dysuria ("tingling").  Musculoskeletal: Positive for arthralgias and back pain.  Allergic/Immunologic: Negative.   Hematological: Negative.   Psychiatric/Behavioral: Negative.     Social History  Substance Use Topics  . Smoking status: Former Smoker    Packs/day: 2.00    Years: 15.00    Types: Cigarettes    Quit date: 06/25/1974  . Smokeless tobacco: Never Used  . Alcohol use No   Objective:   BP (!) 142/78 (BP Location: Right Arm, Patient Position: Sitting, Cuff Size: Large)   Pulse 76   Temp 97.8 F (36.6 C) (Oral)   Resp 16   Wt 237 lb (107.5 kg)   BMI 32.14 kg/m   Physical Exam  Constitutional: He appears well-developed and well-nourished.  HENT:  Head: Normocephalic and atraumatic.  Right Ear: External ear normal.  Left Ear: External ear normal.  Nose: Nose normal.  Eyes: Conjunctivae are normal. No scleral icterus.  Neck: Normal range of motion. Neck supple. No thyromegaly present.  Cardiovascular: Normal rate, regular rhythm and normal heart sounds.   Pulmonary/Chest: Effort normal and breath sounds normal. No respiratory distress.  Abdominal: Soft.  Lymphadenopathy:    He has no cervical adenopathy.  Skin: Skin is warm and dry.  Psychiatric: He has a normal mood and affect. His behavior is normal. Judgment and thought content normal.        Assessment & Plan:     1. Type 2 diabetes mellitus without  complication, unspecified long term insulin use status (HCC) Stable. FU 4 months. Continue medications. - POCT glycosylated hemoglobin (Hb A1C)  2. DDD (degenerative disc disease), lumbar Refills provided/ - traMADol (ULTRAM) 50 MG tablet; Take 1 tablet (50 mg total) by mouth at bedtime.  Dispense: 90 tablet; Refill: 2  3. Dysuria UA negative. Only happens intermittently. Declines prostate check today. - POCT urinalysis dipstick  4. Benign essential HTN Keep FU with cardiology.  5. HLD (hyperlipidemia) Stable.  6. Shoulder pain, unspecified laterality Start Prednisone as below. FU if worsening. - predniSONE (STERAPRED UNI-PAK 21 TAB) 10 MG (21) TBPK tablet; Take 1 tablet (10 mg total) by mouth daily. Taper as directed.  Dispense: 21 tablet; Refill: 1  7. Gastroesophageal reflux disease, esophagitis presence not specified Stable. Pt concerned of long term effects of Omeprazole. Decrease Omeprazole to 1 tablet daily. Try to cut out 1 tablet a week next month, 2 tablets per week the following month, etc. Also elevate the head of the bed.  - omeprazole (PRILOSEC) 20 MG capsule; Take 1 capsule (20 mg total) by mouth  daily.  Dispense: 90 capsule; Refill: 3  8. Flu vaccine need Administered today by [name], RMA. - Flu vaccine HIGH DOSE PF     Patient seen and examined by Miguel Aschoff, MD, and note scribed by Renaldo Fiddler, CMA.  I have done the exam and reviewed the above chart and it is accurate to the best of my knowledge.  Dyamon Sosinski Cranford Mon, MD  [facility]

## 2016-03-22 ENCOUNTER — Other Ambulatory Visit: Payer: Self-pay

## 2016-03-22 ENCOUNTER — Telehealth: Payer: Self-pay | Admitting: Family Medicine

## 2016-03-22 MED ORDER — SILDENAFIL CITRATE 20 MG PO TABS: 20.0000 mg | ORAL_TABLET | Freq: Three times a day (TID) | ORAL | 12 refills | Status: DC

## 2016-03-22 NOTE — Telephone Encounter (Signed)
Pt requested Manuel Stafford return his call. Pt stated he has questions about the sildenafil (REVATIO) 20 MG tablet. Thanks TNP

## 2016-04-16 DIAGNOSIS — M12811 Other specific arthropathies, not elsewhere classified, right shoulder: Secondary | ICD-10-CM | POA: Diagnosis not present

## 2016-04-16 DIAGNOSIS — M7581 Other shoulder lesions, right shoulder: Secondary | ICD-10-CM | POA: Diagnosis not present

## 2016-04-16 DIAGNOSIS — M75121 Complete rotator cuff tear or rupture of right shoulder, not specified as traumatic: Secondary | ICD-10-CM | POA: Diagnosis not present

## 2016-04-30 DIAGNOSIS — K219 Gastro-esophageal reflux disease without esophagitis: Secondary | ICD-10-CM | POA: Diagnosis not present

## 2016-04-30 DIAGNOSIS — I10 Essential (primary) hypertension: Secondary | ICD-10-CM | POA: Diagnosis not present

## 2016-04-30 DIAGNOSIS — E782 Mixed hyperlipidemia: Secondary | ICD-10-CM | POA: Diagnosis not present

## 2016-04-30 DIAGNOSIS — I119 Hypertensive heart disease without heart failure: Secondary | ICD-10-CM | POA: Diagnosis not present

## 2016-05-11 ENCOUNTER — Telehealth: Payer: Self-pay | Admitting: Family Medicine

## 2016-05-11 NOTE — Telephone Encounter (Signed)
Called Pt to schedule AWV with [facility] for 1/30 move appt to afternoon 1pm and 2pm and schedule with wife [phone] Dr. Rosanna Randy after- knb

## 2016-05-15 ENCOUNTER — Encounter: Payer: Self-pay | Admitting: Family Medicine

## 2016-05-30 ENCOUNTER — Telehealth: Payer: Self-pay | Admitting: Family Medicine

## 2016-05-30 DIAGNOSIS — R911 Solitary pulmonary nodule: Secondary | ICD-10-CM

## 2016-05-30 NOTE — Telephone Encounter (Signed)
In November 2016 it was ordered as CT Chest without contrast. Please review. Order the same?-aa

## 2016-05-30 NOTE — Telephone Encounter (Signed)
Pt stating he has been advised by Dr Rosanna Randy will need a CT scan on his lungs in 1 year to recheck spots on his lungs.  Pt is called to get this scheduled.  CB#[phone]/MW

## 2016-05-31 NOTE — Telephone Encounter (Signed)
Order has been placed for CT scan. Please schedule. Thanks!

## 2016-05-31 NOTE — Telephone Encounter (Signed)
Yes  thx

## 2016-06-07 ENCOUNTER — Ambulatory Visit
Admission: RE | Admit: 2016-06-07 | Discharge: 2016-06-07 | Disposition: A | Discharge status: Home / Self Care | Payer: PPO | Source: Ambulatory Visit | Attending: Family Medicine | Admitting: Family Medicine

## 2016-06-07 DIAGNOSIS — R911 Solitary pulmonary nodule: Secondary | ICD-10-CM | POA: Insufficient documentation

## 2016-06-07 DIAGNOSIS — R918 Other nonspecific abnormal finding of lung field: Secondary | ICD-10-CM | POA: Diagnosis not present

## 2016-06-07 DIAGNOSIS — I700 Atherosclerosis of aorta: Secondary | ICD-10-CM | POA: Insufficient documentation

## 2016-06-08 ENCOUNTER — Telehealth: Payer: Self-pay

## 2016-06-08 NOTE — Telephone Encounter (Signed)
Patient called saying that he had his CT scan yesterday and was wanting to know if we had the results back. He is aware that Dr. Rosanna Randy is out of the office, but he was wanting to know if another provider could review this before the weekend for piece of mind. Could you review CT for patient? Contact number is correct. Thanks!

## 2016-06-08 NOTE — Telephone Encounter (Signed)
Advised patient that per Dr. Darnell Level, CT scan is stable and will repeat in 1 year.

## 2016-06-11 DIAGNOSIS — M7581 Other shoulder lesions, right shoulder: Secondary | ICD-10-CM | POA: Diagnosis not present

## 2016-06-11 DIAGNOSIS — M75121 Complete rotator cuff tear or rupture of right shoulder, not specified as traumatic: Secondary | ICD-10-CM | POA: Diagnosis not present

## 2016-06-11 DIAGNOSIS — M12811 Other specific arthropathies, not elsewhere classified, right shoulder: Secondary | ICD-10-CM | POA: Diagnosis not present

## 2016-07-15 ENCOUNTER — Other Ambulatory Visit: Payer: Self-pay | Admitting: Family Medicine

## 2016-07-24 ENCOUNTER — Ambulatory Visit (INDEPENDENT_AMBULATORY_CARE_PROVIDER_SITE_OTHER): Payer: PPO

## 2016-07-24 ENCOUNTER — Ambulatory Visit (INDEPENDENT_AMBULATORY_CARE_PROVIDER_SITE_OTHER): Payer: PPO | Admitting: Family Medicine

## 2016-07-24 VITALS — BP 128/74 | HR 84 | Temp 97.7°F | Resp 14 | Wt 243.0 lb

## 2016-07-24 VITALS — BP 128/74 | HR 84 | Temp 97.7°F | Ht 72.0 in | Wt 243.8 lb

## 2016-07-24 DIAGNOSIS — I10 Essential (primary) hypertension: Secondary | ICD-10-CM

## 2016-07-24 DIAGNOSIS — K219 Gastro-esophageal reflux disease without esophagitis: Secondary | ICD-10-CM

## 2016-07-24 DIAGNOSIS — I708 Atherosclerosis of other arteries: Secondary | ICD-10-CM

## 2016-07-24 DIAGNOSIS — E784 Other hyperlipidemia: Secondary | ICD-10-CM | POA: Diagnosis not present

## 2016-07-24 DIAGNOSIS — E119 Type 2 diabetes mellitus without complications: Secondary | ICD-10-CM

## 2016-07-24 DIAGNOSIS — Z1159 Encounter for screening for other viral diseases: Secondary | ICD-10-CM | POA: Diagnosis not present

## 2016-07-24 DIAGNOSIS — Z1211 Encounter for screening for malignant neoplasm of colon: Secondary | ICD-10-CM

## 2016-07-24 DIAGNOSIS — I7090 Unspecified atherosclerosis: Secondary | ICD-10-CM

## 2016-07-24 DIAGNOSIS — Z0000 Encounter for general adult medical examination without abnormal findings: Secondary | ICD-10-CM | POA: Diagnosis not present

## 2016-07-24 DIAGNOSIS — E7849 Other hyperlipidemia: Secondary | ICD-10-CM

## 2016-07-24 LAB — POCT GLYCOSYLATED HEMOGLOBIN (HGB A1C): Hemoglobin A1C: 8.3

## 2016-07-24 MED ORDER — PIOGLITAZONE HCL 15 MG PO TABS: 15.0000 mg | ORAL_TABLET | Freq: Every day | ORAL | 3 refills | Status: DC

## 2016-07-24 MED ORDER — RANITIDINE HCL 150 MG PO TABS: 150.0000 mg | ORAL_TABLET | Freq: Two times a day (BID) | ORAL | 3 refills | Status: DC

## 2016-07-24 NOTE — Progress Notes (Signed)
Subjective:   Manuel Stafford. is a 71 y.o. male who presents for Medicare Annual/Subsequent preventive examination.  Review of Systems:  N/A  Cardiac Risk Factors include: advanced age (>21men, >11 women);dyslipidemia;hypertension;male gender;obesity (BMI >30kg/m2);diabetes mellitus     Objective:    Vitals: BP 128/74 (BP Location: Right Arm)   Pulse 84   Temp 97.7 F (36.5 C) (Oral)   Ht 6' (1.829 m)   Wt 243 lb 12.8 oz (110.6 kg)   BMI 33.07 kg/m   Body mass index is 33.07 kg/m.  Tobacco History  Smoking Status  . Former Smoker  . Packs/day: 2.00  . Years: 15.00  . Types: Cigarettes  . Quit date: 06/25/1974  Smokeless Tobacco  . Never Used     Counseling given: Not Answered   Past Medical History:  Diagnosis Date  . Back pain    lower  . Diabetes mellitus without complication [facility])    Patient takes Metformin.  . Elevated lipids   . GERD (gastroesophageal reflux disease)   . Hypertension    Past Surgical History:  Procedure Laterality Date  . CIRCUMCISION    . CYSTOSCOPY WITH STENT PLACEMENT Right 09/18/2015   Procedure: CYSTOSCOPY WITH STENT PLACEMENT;  Surgeon: Hollice Espy, MD;  Location: [facility] ORS;  Service: Urology;  Laterality: Right;  . CYSTOSCOPY WITH URETEROSCOPY, STONE BASKETRY AND STENT PLACEMENT Right 10/03/2015   Procedure: CYSTOSCOPY WITH URETEROSCOPY, STONE BASKETRY AND STENT PLACEMENT;  Surgeon: Hollice Espy, MD;  Location: [facility] ORS;  Service: Urology;  Laterality: Right;  . EXTRACORPOREAL SHOCK WAVE LITHOTRIPSY Right 09/15/2015   Procedure: EXTRACORPOREAL SHOCK WAVE LITHOTRIPSY (ESWL);  Surgeon: Nickie Retort, MD;  Location: [facility] ORS;  Service: Urology;  Laterality: Right;  . HERNIA REPAIR    . ROTATOR CUFF REPAIR Right 06/2012   Family History  Problem Relation Age of Onset  . Hypertension Mother   . Diabetes Father   . CAD Father   . Diabetes Sister   . Heart disease Sister   . CAD Sister    History  Sexual Activity    . Sexual activity: Not Currently    Outpatient Encounter Prescriptions as of 07/24/2016  Medication Sig  . aspirin 81 MG tablet Take 81 mg by mouth daily.   . Cholecalciferol (VITAMIN D3) 2000 UNITS capsule Take 2,000 Units by mouth daily.  . diphenhydrAMINE (BENADRYL ALLERGY) 25 mg capsule Take by mouth.   [name] glucose blood test strip 1 each by Other route 2 (two) times daily as needed for other. Use as instructed  . hydrochlorothiazide (HYDRODIURIL) 25 MG tablet TAKE 1 TABLET (25 MG TOTAL) BY MOUTH DAILY.  . metFORMIN (GLUCOPHAGE) 1000 MG tablet TAKE 1 TABLET (1,000 MG TOTAL) BY MOUTH 2 (TWO) TIMES DAILY WITH A MEAL.  . MULTIPLE VITAMIN PO Take by mouth.  [name] omeprazole (PRILOSEC) 20 MG capsule Take 1 capsule (20 mg total) by mouth daily.  . rosuvastatin (CRESTOR) 20 MG tablet Take 1 tablet (20 mg total) by mouth daily.  . Saw Palmetto, Serenoa repens, (SAW PALMETTO BERRY) 160 MG CAPS Take 2 tablets by mouth daily.   . sildenafil (REVATIO) 20 MG tablet Take 1 tablet (20 mg total) by mouth 3 (three) times daily. (Patient taking differently: Take 20 mg by mouth 3 (three) times daily. )  . telmisartan (MICARDIS) 40 MG tablet Take 1 tablet by mouth daily.  [name] tiZANidine (ZANAFLEX) 4 MG tablet Take 4 mg by mouth at bedtime. Reported on 09/19/2015  . traMADol Veatrice Bourbon)  50 MG tablet Take 1 tablet (50 mg total) by mouth at bedtime.  . predniSONE (STERAPRED UNI-PAK 21 TAB) 10 MG (21) TBPK tablet Take 1 tablet (10 mg total) by mouth daily. Taper as directed. (Patient not taking: Reported on 07/24/2016)   No facility-administered encounter medications on file as of 07/24/2016.     Activities of Daily Living In your present state of health, do you have any difficulty performing the following activities: 07/24/2016 09/28/2015  Hearing? N N  Vision? N N  Difficulty concentrating or making decisions? N N  Walking or climbing stairs? N N  Dressing or bathing? N N  Doing errands, shopping? N N  Preparing Food  and eating ? N -  Using the Toilet? N -  In the past six months, have you accidently leaked urine? N -  Do you have problems with loss of bowel control? N -  Managing your Medications? N -  Managing your Finances? N -  Housekeeping or managing your Housekeeping? N -  Some recent data might be hidden    Patient Care Team: [name], MD as PCP - General (Family Medicine) Thelma Comp, OD as Consulting Physician (Optometry) Corey Skains, MD as Consulting Physician (Cardiology) Ralene Bathe, MD as Consulting Physician (Dermatology) Hollice Espy, MD as Consulting Physician (Urology)   Assessment:     Exercise Activities and Dietary recommendations Current Exercise Habits: The patient does not participate in regular exercise at present, Exercise limited by: None identified (winter weather)  Goals    . Exercise 150 minutes per week (moderate activity)          Starting in spring 2018, I will start walking daily for 30-45 minutes.       Fall Risk Fall Risk  07/24/2016 02/07/2015  Falls in the past year? No No   Depression Screen PHQ 2/9 Scores 07/24/2016 02/07/2015  PHQ - 2 Score 0 0    Cognitive Function     6CIT Screen 07/24/2016  What Year? 0 points  What month? 0 points  What time? 0 points  Count back from 20 0 points  Months in reverse 0 points  Repeat phrase 2 points  Total Score 2    Immunization History  Administered Date(s) Administered  . Hepatitis A 08/13/2006  . Influenza, High Dose Seasonal PF 03/23/2015, 03/21/2016  . Pneumococcal Conjugate-13 05/25/2014  . Pneumococcal Polysaccharide-23 04/08/2012  . Tdap 10/26/2005  . Zoster 08/13/2006, 04/17/2013   Screening Tests Health Maintenance  Topic Date Due  . COLONOSCOPY  11/23/2016 (Originally 12/21/2015)  . Hepatitis C Screening  11/23/2016 (Originally January 31, 1946)  . TETANUS/TDAP  06/25/2017 (Originally 10/27/2015)  . HEMOGLOBIN A1C  09/18/2016  . OPHTHALMOLOGY EXAM  01/23/2017    . FOOT EXAM  07/24/2017  . INFLUENZA VACCINE  Completed  . ZOSTAVAX  Completed  . PNA vac Low Risk Adult  Completed      Plan:    I have personally reviewed and addressed the Medicare Annual Wellness questionnaire and have noted the following in the patient's chart:  A. Medical and social history B. Use of alcohol, tobacco or illicit drugs  C. Current medications and supplements D. Functional ability and status E.  Nutritional status F.  Physical activity G. Advance directives H. List of other physicians I.  Hospitalizations, surgeries, and ER visits in previous 12 months J.  [place] such as hearing and vision if needed, cognitive and depression L. Referrals and appointments - none  In addition,  I have reviewed and discussed with patient certain preventive protocols, quality metrics, and best practice recommendations. A written personalized care plan for preventive services as well as general preventive health recommendations were provided to patient.  See attached scanned questionnaire for additional information.   Signed,  Fabio Neighbors, LPN Nurse Health Advisor   MD Recommendations: Need diabetic foot exam done by PCP. Follow up on colonoscopy and hepatitis C screening results at next Ov.  I have reviewed the health advisors note, was  available for consultation and I agree with documentation and plan. Miguel Aschoff MD [facility]

## 2016-07-24 NOTE — Patient Instructions (Signed)
Continue taking Metformin. We are adding generic Actos to take daily. Stop Omeprazole and start Ranitidine twice daily but if heartburn does not get controlled by the new medication you re start Omeprazole at that time. Will re check in May and will get lab work done at that time.

## 2016-07-24 NOTE — Patient Instructions (Signed)

## 2016-07-24 NOTE — Progress Notes (Signed)
Manuel Stafford.  MRN: [id number] DOB: [birth year]  Subjective:  HPI  Patient is here for 4 months follow up. Last visit was on 03/21/16 He is checking sugars in the morning and reading has been around 150-175. Tingling in the feet towards the end of the day is present. Lab Results  Component Value Date   HGBA1C 7.0 03/21/2016   He checks his b/p every other day and reading has been around 125-130/70s. BP Readings from Last 3 Encounters:  07/24/16 128/74  07/24/16 128/74  03/21/16 (!) 142/78   He has been able to cut down on Omeprazole use and is taking 1 tablet daily.  Last labs were 10/19/15. Patient Active Problem List   Diagnosis Date Noted  . Shoulder pain 03/21/2016  . Flu vaccine need 03/21/2016  . Hydronephrosis with urinary obstruction due to ureteral calculus 09/26/2015  . Microscopic hematuria 09/26/2015  . Hypertensive left ventricular hypertrophy 09/13/2015  . Nephrolithiasis 08/29/2015  . Right ureteral stone 08/29/2015  . Abnormal ECG 08/22/2015  . Benign essential HTN 08/22/2015  . Combined fat and carbohydrate induced hyperlipemia 08/22/2015  . Breathlessness on exertion 08/22/2015  . Lung nodules 04/28/2015  . Allergic rhinitis 12/31/2014  . Diabetes ([place]) 12/31/2014  . Failure of erection 12/31/2014  . Fatty liver disease, nonalcoholic [id number]  . Acid reflux 12/31/2014  . BP (high blood pressure) 12/31/2014  . HLD (hyperlipidemia) 12/31/2014  . Lumbosacral spondylosis without myelopathy 12/31/2014  . Adiposity 12/31/2014  . Herpes zona 12/31/2014  . Disorder of shoulder 12/31/2014  . Apnea, sleep 12/31/2014  . Avitaminosis D 12/31/2014  . DDD (degenerative disc disease), lumbar 11/30/2013  . Back ache 11/30/2013    Past Medical History:  Diagnosis Date  . Back pain    lower  . Diabetes mellitus without complication [facility])    Patient takes Metformin.  . Elevated lipids   . GERD (gastroesophageal reflux disease)   . Hypertension      Social History   Social History  . Marital status: Married    Spouse name: N/A  . Number of children: N/A  . Years of education: N/A   Occupational History  . Not on file.   Social History Main Topics  . Smoking status: Former Smoker    Packs/day: 2.00    Years: 15.00    Types: Cigarettes    Quit date: 06/25/1974  . Smokeless tobacco: Never Used  . Alcohol use No  . Drug use: No  . Sexual activity: Not Currently   Other Topics Concern  . Not on file   Social History Narrative  . No narrative on file    Outpatient Encounter Prescriptions as of 07/24/2016  Medication Sig Note  . aspirin 81 MG tablet Take 81 mg by mouth daily.  12/30/2014: Received from: [facility]  . Cholecalciferol (VITAMIN D3) 2000 UNITS capsule Take 2,000 Units by mouth daily.   . diphenhydrAMINE (BENADRYL ALLERGY) 25 mg capsule Take by mouth.  12/30/2014: Received from: [facility]  . glucose blood test strip 1 each by Other route 2 (two) times daily as needed for other. Use as instructed   . hydrochlorothiazide (HYDRODIURIL) 25 MG tablet TAKE 1 TABLET (25 MG TOTAL) BY MOUTH DAILY.   . metFORMIN (GLUCOPHAGE) 1000 MG tablet TAKE 1 TABLET (1,000 MG TOTAL) BY MOUTH 2 (TWO) TIMES DAILY WITH A MEAL.   . MULTIPLE VITAMIN PO Take by mouth. 12/30/2014: Received from: [facility]  . omeprazole (PRILOSEC) 20 MG capsule  Take 1 capsule (20 mg total) by mouth daily.   . rosuvastatin (CRESTOR) 20 MG tablet Take 1 tablet (20 mg total) by mouth daily.   . Saw Palmetto, Serenoa repens, (SAW PALMETTO BERRY) 160 MG CAPS Take 2 tablets by mouth daily.  12/30/2014: Received from: [facility]  . sildenafil (REVATIO) 20 MG tablet Take 1 tablet (20 mg total) by mouth 3 (three) times daily. (Patient taking differently: Take 20 mg by mouth 3 (three) times daily. )   . telmisartan (MICARDIS) 40 MG tablet Take 1 tablet by mouth daily. 03/21/2016: Received from:  External Pharmacy  . tiZANidine (ZANAFLEX) 4 MG tablet Take 4 mg by mouth at bedtime. Reported on 09/19/2015 12/30/2014: Received from: External Pharmacy  . traMADol (ULTRAM) 50 MG tablet Take 1 tablet (50 mg total) by mouth at bedtime.   . [DISCONTINUED] predniSONE (STERAPRED UNI-PAK 21 TAB) 10 MG (21) TBPK tablet Take 1 tablet (10 mg total) by mouth daily. Taper as directed. (Patient not taking: Reported on 07/24/2016)    No facility-administered encounter medications on file as of 07/24/2016.     No Known Allergies  Review of Systems  Constitutional: Negative.   Respiratory: Negative.   Cardiovascular: Negative.   Gastrointestinal: Negative.   Musculoskeletal: Negative.   Skin: Negative.   Neurological: Positive for tingling.  Endo/Heme/Allergies: Negative.   Psychiatric/Behavioral: Negative.     Objective:  BP 128/74   Pulse 84   Temp 97.7 F (36.5 C)   Resp 14   Wt 243 lb (110.2 kg)   BMI 32.96 kg/m   Physical Exam  Constitutional: He is oriented to person, place, and time and well-developed, well-nourished, and in no distress.  HENT:  Head: Normocephalic and atraumatic.  Eyes: Conjunctivae are normal. Pupils are equal, round, and reactive to light.  Neck: Normal range of motion. Neck supple.  Cardiovascular: Normal rate, regular rhythm, normal heart sounds and intact distal pulses.   No murmur heard. Pulmonary/Chest: Effort normal and breath sounds normal. No respiratory distress. He has no wheezes.  Neurological: He is alert and oriented to person, place, and time.    Assessment and Plan :  1. Type 2 diabetes mellitus without complication, unspecified long term insulin use status (HCC) A1C 8.3. Worse. Add Actos 15 mg. Re check on the next visit. Advised patient if he develops swelling in his feet/legs to let us know. - POCT HgB A1C  2. Benign essential HTN Stable .Controlled. Continue current medication.  3. Other hyperlipidemia  4. Gastroesophageal reflux  disease, esophagitis presence not specified Stable on Omeprazole but due to possible risks of taking this medication long term will switch to Ranitidine and if symptoms are not controlled advised patient to go back on Omeprazole. 5. Atherosclerosis of arteries Per recent CT scan test. Discussed this in detail with patient and her wife today. Offered cardiology referral for further plan of care if necessary. 6.Obesity HPI, Exam and A&P transcribed under direction and in the presence of Miguel Aschoff, MD. I have done the exam and reviewed the chart and it is accurate to the best of my knowledge. Development worker, community has been used and  any errors in dictation or transcription are unintentional. Miguel Aschoff M.D. [facility]

## 2016-07-25 ENCOUNTER — Encounter: Payer: Self-pay | Admitting: *Deleted

## 2016-08-01 ENCOUNTER — Encounter: Payer: Self-pay | Admitting: General Surgery

## 2016-08-01 ENCOUNTER — Ambulatory Visit (INDEPENDENT_AMBULATORY_CARE_PROVIDER_SITE_OTHER): Payer: PPO | Admitting: General Surgery

## 2016-08-01 VITALS — BP 158/76 | HR 88 | Resp 12 | Ht 73.0 in | Wt 243.0 lb

## 2016-08-01 DIAGNOSIS — Z1211 Encounter for screening for malignant neoplasm of colon: Secondary | ICD-10-CM

## 2016-08-01 MED ORDER — POLYETHYLENE GLYCOL 3350 17 GM/SCOOP PO POWD: ORAL | 0 refills | Status: DC

## 2016-08-01 NOTE — Patient Instructions (Signed)
The patient is aware to call back for any questions or concerns.  Colonoscopy, Adult A colonoscopy is an exam to look at the entire large intestine. During the exam, a lubricated, bendable tube is inserted into the anus and then passed into the rectum, colon, and other parts of the large intestine. A colonoscopy is often done as a part of normal colorectal screening or in response to certain symptoms, such as anemia, persistent diarrhea, abdominal pain, and blood in the stool. The exam can help screen for and diagnose medical problems, including:  Tumors.  Polyps.  Inflammation.  Areas of bleeding. Tell a health care provider about:  Any allergies you have.  All medicines you are taking, including vitamins, herbs, eye drops, creams, and over-the-counter medicines.  Any problems you or family members have had with anesthetic medicines.  Any blood disorders you have.  Any surgeries you have had.  Any medical conditions you have.  Any problems you have had passing stool. What are the risks? Generally, this is a safe procedure. However, problems may occur, including:  Bleeding.  A tear in the intestine.  A reaction to medicines given during the exam.  Infection (rare). What happens before the procedure? Eating and drinking restrictions  Follow instructions from your health care provider about eating and drinking, which may include:  A few days before the procedure - follow a low-fiber diet. Avoid nuts, seeds, dried fruit, raw fruits, and vegetables.  1-3 days before the procedure - follow a clear liquid diet. Drink only clear liquids, such as clear broth or bouillon, black coffee or tea, clear juice, clear soft drinks or sports drinks, gelatin desert, and popsicles. Avoid any liquids that contain red or purple dye.  On the day of the procedure - do not eat or drink anything during the 2 hours before the procedure, or within the time period that your health care provider  recommends. Bowel prep  If you were prescribed an oral bowel prep to clean out your colon:  Take it as told by your health care provider. Starting the day before your procedure, you will need to drink a large amount of medicated liquid. The liquid will cause you to have multiple loose stools until your stool is almost clear or light green.  If your skin or anus gets irritated from diarrhea, you may use these to relieve the irritation:  Medicated wipes, such as adult wet wipes with aloe and vitamin E.  A skin soothing-product like petroleum jelly.  If you vomit while drinking the bowel prep, take a break for up to 60 minutes and then begin the bowel prep again. If vomiting continues and you cannot take the bowel prep without vomiting, call your health care provider. General instructions  Ask your health care provider about changing or stopping your regular medicines. This is especially important if you are taking diabetes medicines or blood thinners.  Plan to have someone take you home from the hospital or clinic. What happens during the procedure?  An IV tube may be inserted into one of your veins.  You will be given medicine to help you relax (sedative).  To reduce your risk of infection:  Your health care team will wash or sanitize their hands.  Your anal area will be washed with soap.  You will be asked to lie on your side with your knees bent.  Your health care provider will lubricate a long, thin, flexible tube. The tube will have a camera and a light  on the end.  The tube will be inserted into your anus.  The tube will be gently eased through your rectum and colon.  Air will be delivered into your colon to keep it open. You may feel some pressure or cramping.  The camera will be used to take images during the procedure.  A small tissue sample may be removed from your body to be examined under a microscope (biopsy). If any potential problems are found, the tissue will  be sent to a lab for testing.  If small polyps are found, your health care provider may remove them and have them checked for cancer cells.  The tube that was inserted into your anus will be slowly removed. The procedure may vary among health care providers and hospitals. What happens after the procedure?  Your blood pressure, heart rate, breathing rate, and blood oxygen level will be monitored until the medicines you were given have worn off.  Do not drive for 24 hours after the exam.  You may have a small amount of blood in your stool.  You may pass gas and have mild abdominal cramping or bloating due to the air that was used to inflate your colon during the exam.  It is up to you to get the results of your procedure. Ask your health care provider, or the department performing the procedure, when your results will be ready. This information is not intended to replace advice given to you by your health care provider. Make sure you discuss any questions you have with your health care provider. Document Released: 06/08/2000 Document Revised: 12/30/2015 Document Reviewed: 08/23/2015 Elsevier Interactive Patient Education  2017 [employer].

## 2016-08-01 NOTE — Progress Notes (Signed)
Patient ID: Manuel Stafford., male   DOB: Jun 01, 1946, 70 y.o.   MRN: [id number]  Chief Complaint  Patient presents with  . Colonoscopy    HPI [name]. is a 71 y.o. male.  Who presents for a colonoscopy discussion. The last colonoscopy was completed in 2007 . Denies any gastrointestinal issues. Bowels move regular and no bleeding noted. I have reviewed the history of present illness with the patient.  HPI  Past Medical History:  Diagnosis Date  . Back pain    lower  . Diabetes mellitus without complication [facility])    Patient takes Metformin.  . Elevated lipids   . GERD (gastroesophageal reflux disease)   . Hemorrhoids 2017  . Hypertension   . Kidney stone 2017    Past Surgical History:  Procedure Laterality Date  . CIRCUMCISION    . COLONOSCOPY  2007  . CYSTOSCOPY WITH STENT PLACEMENT Right 09/18/2015   Procedure: CYSTOSCOPY WITH STENT PLACEMENT;  Surgeon: Hollice Espy, MD;  Location: [facility] ORS;  Service: Urology;  Laterality: Right;  . CYSTOSCOPY WITH URETEROSCOPY, STONE BASKETRY AND STENT PLACEMENT Right 10/03/2015   Procedure: CYSTOSCOPY WITH URETEROSCOPY, STONE BASKETRY AND STENT PLACEMENT;  Surgeon: Hollice Espy, MD;  Location: [facility] ORS;  Service: Urology;  Laterality: Right;  . EXTRACORPOREAL SHOCK WAVE LITHOTRIPSY Right 09/15/2015   Procedure: EXTRACORPOREAL SHOCK WAVE LITHOTRIPSY (ESWL);  Surgeon: Nickie Retort, MD;  Location: [facility] ORS;  Service: Urology;  Laterality: Right;  . HERNIA REPAIR    . ROTATOR CUFF REPAIR Right 06/2012    Family History  Problem Relation Age of Onset  . Hypertension Mother   . Diabetes Father   . CAD Father   . Diabetes Sister   . Heart disease Sister   . CAD Sister     Social History Social History  Substance Use Topics  . Smoking status: Former Smoker    Packs/day: 2.00    Years: 15.00    Types: Cigarettes    Quit date: 06/25/1974  . Smokeless tobacco: Never Used  . Alcohol use No    No Known  Allergies  Current Outpatient Prescriptions  Medication Sig Dispense Refill  . aspirin 81 MG tablet Take 81 mg by mouth daily.     . Cholecalciferol (VITAMIN D3) 2000 UNITS capsule Take 2,000 Units by mouth daily.    . diphenhydrAMINE (BENADRYL ALLERGY) 25 mg capsule Take by mouth.     [name] glucose blood test strip 1 each by Other route 2 (two) times daily as needed for other. Use as instructed 100 each 12  . hydrochlorothiazide (HYDRODIURIL) 25 MG tablet TAKE 1 TABLET (25 MG TOTAL) BY MOUTH DAILY. 90 tablet 3  . metFORMIN (GLUCOPHAGE) 1000 MG tablet TAKE 1 TABLET (1,000 MG TOTAL) BY MOUTH 2 (TWO) TIMES DAILY WITH A MEAL. 180 tablet 3  . MULTIPLE VITAMIN PO Take by mouth.    . pioglitazone (ACTOS) 15 MG tablet Take 1 tablet (15 mg total) by mouth daily. 90 tablet 3  . ranitidine (ZANTAC) 150 MG tablet Take 1 tablet (150 mg total) by mouth 2 (two) times daily. 180 tablet 3  . rosuvastatin (CRESTOR) 20 MG tablet Take 1 tablet (20 mg total) by mouth daily. 90 tablet 3  . Saw Palmetto, Serenoa repens, (SAW PALMETTO BERRY) 160 MG CAPS Take 2 tablets by mouth daily.     . sildenafil (REVATIO) 20 MG tablet Take 1 tablet (20 mg total) by mouth 3 (three) times daily. (Patient taking differently: Take  20 mg by mouth 3 (three) times daily. ) 50 tablet 12  . telmisartan (MICARDIS) 40 MG tablet Take 1 tablet by mouth daily.    [name] tiZANidine (ZANAFLEX) 4 MG tablet Take 4 mg by mouth at bedtime. Reported on 09/19/2015    . traMADol (ULTRAM) 50 MG tablet Take 1 tablet (50 mg total) by mouth at bedtime. 90 tablet 2   No current facility-administered medications for this visit.     Review of Systems Review of Systems  Constitutional: Negative.   Respiratory: Negative.   Cardiovascular: Negative.   Gastrointestinal: Negative.     Blood pressure (!) 158/76, pulse 88, resp. rate 12, height 6\' 1"  (1.854 m), weight 243 lb (110.2 kg).  Physical Exam Physical Exam  Constitutional: He is oriented to person,  place, and time. He appears well-developed and well-nourished.  HENT:  Mouth/Throat: Oropharynx is clear and moist.  Eyes: Conjunctivae are normal. No scleral icterus.  Neck: Neck supple.  Cardiovascular: Normal rate, regular rhythm and normal heart sounds.   Pulmonary/Chest: Effort normal and breath sounds normal.  Abdominal: Soft.  Lymphadenopathy:    He has no cervical adenopathy.  Neurological: He is alert and oriented to person, place, and time.  Skin: Skin is warm and dry.  Psychiatric: His behavior is normal.    Data Reviewed Prior notes.  Assessment    Encounter for screening colonoscopy. Stable exam.    Plan    Colonoscopy with possible biopsy/polypectomy prn: Information regarding the procedure, including its potential risks and complications (including but not limited to perforation of the bowel, which may require emergency surgery to repair, and bleeding) was verbally given to the patient. Educational information regarding lower intestinal endoscopy was given to the patient. Written instructions for how to complete the bowel prep using Miralax were provided. The importance of drinking ample fluids to avoid dehydration as a result of the prep emphasized.     Patient has been scheduled for a colonoscopy on 08-22-16 at [facility]. This patient has been asked to hold Metformin and Actos day of colonoscopy prep and procedure.    This information has been scribed by Karie Fetch RN, BSN,BC.   Karie Fetch M 08/01/2016, 12:52 PM

## 2016-08-08 ENCOUNTER — Encounter: Payer: Self-pay | Admitting: General Surgery

## 2016-08-15 ENCOUNTER — Telehealth: Payer: Self-pay

## 2016-08-15 NOTE — Telephone Encounter (Signed)
Patient states he was switched from Omeprazole to Zantac. Zantac is not controlling his symptoms. He is requesting to switch back to Omeprazole. Patient still has a RX at home, but needs to make sure it is okay to switch back.  CB#[phone]

## 2016-08-15 NOTE — Telephone Encounter (Signed)
Is this ok with you? ED

## 2016-08-15 NOTE — Telephone Encounter (Signed)
yes

## 2016-08-16 MED ORDER — OMEPRAZOLE 20 MG PO CPDR: 20.0000 mg | DELAYED_RELEASE_CAPSULE | Freq: Every day | ORAL | 3 refills | Status: DC

## 2016-08-16 NOTE — Telephone Encounter (Signed)
Patient advised. Patient does not need any refills and will let us know when he does. Medication updated in the chart-aa

## 2016-08-20 ENCOUNTER — Other Ambulatory Visit: Payer: Self-pay | Admitting: General Surgery

## 2016-08-22 ENCOUNTER — Ambulatory Visit: Payer: PPO | Admitting: Anesthesiology

## 2016-08-22 ENCOUNTER — Encounter
Admission: RE | Disposition: A | Discharge status: Home / Self Care | Payer: Self-pay | Source: Ambulatory Visit | Attending: General Surgery

## 2016-08-22 ENCOUNTER — Ambulatory Visit
Admission: RE | Admit: 2016-08-22 | Discharge: 2016-08-22 | Disposition: A | Discharge status: Home / Self Care | Payer: PPO | Source: Ambulatory Visit | Attending: General Surgery | Admitting: General Surgery

## 2016-08-22 ENCOUNTER — Encounter: Payer: Self-pay | Admitting: *Deleted

## 2016-08-22 DIAGNOSIS — Z87891 Personal history of nicotine dependence: Secondary | ICD-10-CM | POA: Insufficient documentation

## 2016-08-22 DIAGNOSIS — E119 Type 2 diabetes mellitus without complications: Secondary | ICD-10-CM | POA: Diagnosis not present

## 2016-08-22 DIAGNOSIS — Z7984 Long term (current) use of oral hypoglycemic drugs: Secondary | ICD-10-CM | POA: Insufficient documentation

## 2016-08-22 DIAGNOSIS — N289 Disorder of kidney and ureter, unspecified: Secondary | ICD-10-CM | POA: Diagnosis not present

## 2016-08-22 DIAGNOSIS — I10 Essential (primary) hypertension: Secondary | ICD-10-CM | POA: Diagnosis not present

## 2016-08-22 DIAGNOSIS — K5730 Diverticulosis of large intestine without perforation or abscess without bleeding: Secondary | ICD-10-CM

## 2016-08-22 DIAGNOSIS — G4730 Sleep apnea, unspecified: Secondary | ICD-10-CM | POA: Diagnosis not present

## 2016-08-22 DIAGNOSIS — Z79899 Other long term (current) drug therapy: Secondary | ICD-10-CM | POA: Diagnosis not present

## 2016-08-22 DIAGNOSIS — R21 Rash and other nonspecific skin eruption: Secondary | ICD-10-CM | POA: Diagnosis not present

## 2016-08-22 DIAGNOSIS — Z1211 Encounter for screening for malignant neoplasm of colon: Secondary | ICD-10-CM | POA: Insufficient documentation

## 2016-08-22 DIAGNOSIS — Z7982 Long term (current) use of aspirin: Secondary | ICD-10-CM | POA: Diagnosis not present

## 2016-08-22 DIAGNOSIS — K219 Gastro-esophageal reflux disease without esophagitis: Secondary | ICD-10-CM | POA: Insufficient documentation

## 2016-08-22 DIAGNOSIS — K5790 Diverticulosis of intestine, part unspecified, without perforation or abscess without bleeding: Secondary | ICD-10-CM | POA: Diagnosis not present

## 2016-08-22 HISTORY — PX: COLONOSCOPY WITH PROPOFOL: SHX5780

## 2016-08-22 LAB — GLUCOSE, CAPILLARY: Glucose-Capillary: 211 mg/dL — ABNORMAL HIGH (ref 65–99)

## 2016-08-22 SURGERY — COLONOSCOPY WITH PROPOFOL
Anesthesia: General

## 2016-08-22 MED ORDER — SODIUM CHLORIDE 0.9 % IV SOLN
INTRAVENOUS | Status: DC
  Administered 2016-08-22: 11:00:00 via INTRAVENOUS

## 2016-08-22 MED ORDER — PROPOFOL 500 MG/50ML IV EMUL
INTRAVENOUS | Status: DC | PRN
  Administered 2016-08-22: 140 ug/kg/min via INTRAVENOUS

## 2016-08-22 MED ORDER — PROPOFOL 500 MG/50ML IV EMUL
INTRAVENOUS | Status: AC
  Filled 2016-08-22: qty 50

## 2016-08-22 MED ORDER — PROPOFOL 10 MG/ML IV BOLUS
INTRAVENOUS | Status: DC | PRN
  Administered 2016-08-22: 80 mg via INTRAVENOUS

## 2016-08-22 NOTE — Anesthesia Postprocedure Evaluation (Signed)
Anesthesia Post Note  Patient: Manuel Stafford.  Procedure(s) Performed: Procedure(s) (LRB): COLONOSCOPY WITH PROPOFOL (N/A)  Patient location during evaluation: Endoscopy Anesthesia Type: General Level of consciousness: awake and alert Pain management: pain level controlled Vital Signs Assessment: post-procedure vital signs reviewed and stable Respiratory status: spontaneous breathing, nonlabored ventilation, respiratory function stable and patient connected to nasal cannula oxygen Cardiovascular status: blood pressure returned to baseline and stable Postop Assessment: no signs of nausea or vomiting Anesthetic complications: no     Last Vitals:  Vitals:   08/22/16 1159 08/22/16 1210  BP: 120/76 (!) 121/91  Pulse: 92 81  Resp: 20 15  Temp:      Last Pain:  Vitals:   08/22/16 1149  TempSrc: Tympanic                 Precious Haws Quinterius Gaida

## 2016-08-22 NOTE — Anesthesia Preprocedure Evaluation (Signed)
Anesthesia Evaluation  Patient identified by MRN, date of birth, ID band Patient awake    Reviewed: Allergy & Precautions, H&P , NPO status , Patient's Chart, lab work & pertinent test results  History of Anesthesia Complications Negative for: history of anesthetic complications  Airway Mallampati: III  TM Distance: <3 FB Neck ROM: limited    Dental  (+) Poor Dentition, Chipped, Caps   Pulmonary sleep apnea , former smoker,    Pulmonary exam normal breath sounds clear to auscultation       Cardiovascular Exercise Tolerance: Good hypertension, (-) angina(-) Past MI and (-) DOE Normal cardiovascular exam Rhythm:regular Rate:Normal     Neuro/Psych negative neurological ROS  negative psych ROS   GI/Hepatic Neg liver ROS, GERD  Controlled and Medicated,  Endo/Other  diabetes, Type 2  Renal/GU Renal disease  negative genitourinary   Musculoskeletal   Abdominal   Peds  Hematology negative hematology ROS (+)   Anesthesia Other Findings Past Medical History: No date: Back pain     Comment: lower No date: Diabetes mellitus without complication (HCC)     Comment: Patient takes Metformin. No date: Elevated lipids No date: GERD (gastroesophageal reflux disease) 2017: Hemorrhoids No date: Hypertension 2017: Kidney stone  Past Surgical History: No date: CIRCUMCISION 2007: COLONOSCOPY 09/18/2015: CYSTOSCOPY WITH STENT PLACEMENT Right     Comment: Procedure: CYSTOSCOPY WITH STENT PLACEMENT;                Surgeon: Hollice Espy, MD;  Location: [facility]               ORS;  Service: Urology;  Laterality: Right; 10/03/2015: CYSTOSCOPY WITH URETEROSCOPY, STONE BASKETRY A* Right     Comment: Procedure: CYSTOSCOPY WITH URETEROSCOPY, STONE              BASKETRY AND STENT PLACEMENT;  Surgeon: Hollice Espy, MD;  Location: [facility] ORS;  Service:               Urology;  Laterality: Right; 09/15/2015: EXTRACORPOREAL SHOCK  WAVE LITHOTRIPSY Right     Comment: Procedure: EXTRACORPOREAL SHOCK WAVE               LITHOTRIPSY (ESWL);  Surgeon: Nickie Retort, MD;  Location: [facility] ORS;  Service:               Urology;  Laterality: Right; No date: HERNIA REPAIR 06/2012: ROTATOR CUFF REPAIR Right  BMI    Body Mass Index:  31.40 kg/m      Reproductive/Obstetrics negative OB ROS                             Anesthesia Physical Anesthesia Plan  ASA: III  Anesthesia Plan: General   Post-op Pain Management:    Induction:   Airway Management Planned:   Additional Equipment:   Intra-op Plan:   Post-operative Plan:   Informed Consent: I have reviewed the patients History and Physical, chart, labs and discussed the procedure including the risks, benefits and alternatives for the proposed anesthesia with the patient or authorized representative who has indicated his/her understanding and acceptance.   Dental Advisory Given  Plan Discussed with: Anesthesiologist, CRNA and Surgeon  Anesthesia Plan Comments:         Anesthesia Quick Evaluation

## 2016-08-22 NOTE — Anesthesia Post-op Follow-up Note (Signed)
Anesthesia QCDR form completed.        

## 2016-08-22 NOTE — Op Note (Signed)
[facility] Gastroenterology Patient Name: Manuel Stafford Procedure Date: 08/22/2016 11:18 AM MRN: [id number] Account #: [id number] Date of Birth: [birth year] Admit Type: Outpatient Age: 71 Room: [facility] ENDO ROOM 3 Gender: Male Note Status: Finalized Procedure:            Colonoscopy Indications:          Screening for colorectal malignant neoplasm Providers:            Seeplaputhur G. Jamal Collin, MD Referring MD:         Forest Gleason Md, MD (Referring MD) Medicines:            General Anesthesia Complications:        No immediate complications. Procedure:            Pre-Anesthesia Assessment:                       - General anesthesia under the supervision of an                        anesthesiologist was determined to be medically                        necessary for this procedure based on review of the                        patient's medical history, medications, and prior                        anesthesia history.                       After obtaining informed consent, the colonoscope was                        passed under direct vision. Throughout the procedure,                        the patient's blood pressure, pulse, and oxygen                        saturations were monitored continuously. The                        Colonoscope was introduced through the anus and                        advanced to the the cecum, identified by the ileocecal                        valve. The colonoscopy was performed without                        difficulty. The patient tolerated the procedure well.                        The quality of the bowel preparation was adequate to                        identify polyps 6 mm and larger in size. Findings:      The perianal exam findings include a perianal rash.  Many small and large-mouthed diverticula were found in the sigmoid colon.      The exam was otherwise without abnormality on direct and retroflexion        views. Impression:           - Perianal rash found on perianal exam.                       - Diverticulosis in the sigmoid colon.                       - The examination was otherwise normal on direct and                        retroflexion views.                       - No specimens collected. Recommendation:       - Discharge patient to home.                       - Resume previous diet.                       - Continue present medications.                       - Return to primary care physician as previously                        scheduled. Procedure Code(s):    --- Professional ---                       [phone], Colonoscopy, flexible; diagnostic, including                        collection of specimen(s) by brushing or washing, when                        performed (separate procedure) Diagnosis Code(s):    --- Professional ---                       Z12.11, Encounter for screening for malignant neoplasm                        of colon                       R21, Rash and other nonspecific skin eruption                       K57.30, Diverticulosis of large intestine without                        perforation or abscess without bleeding CPT copyright 2016 American [employer] Association. All rights reserved. The codes documented in this report are preliminary and upon coder review may  be revised to meet current compliance requirements. Christene Lye, MD 08/22/2016 11:50:26 AM This report has been signed electronically. Number of Addenda: 0 Note Initiated On: 08/22/2016 11:18 AM Scope Withdrawal Time: 0 hours 14 minutes 12 seconds  Total Procedure Duration: 0 hours 23 minutes 54 seconds       [facility]

## 2016-08-22 NOTE — H&P (View-Only) (Signed)
Patient ID: Manuel Patella., male   DOB: [birth year], 71 y.o.   MRN: [id number]  Chief Complaint  Patient presents with  . Colonoscopy    HPI Manuel R Dmetri Acri. is a 71 y.o. male.  Who presents for a colonoscopy discussion. The last colonoscopy was completed in 2007 . Denies any gastrointestinal issues. Bowels move regular and no bleeding noted. I have reviewed the history of present illness with the patient.  HPI  Past Medical History:  Diagnosis Date  . Back pain    lower  . Diabetes mellitus without complication [facility])    Patient takes Metformin.  . Elevated lipids   . GERD (gastroesophageal reflux disease)   . Hemorrhoids 2017  . Hypertension   . Kidney stone 2017    Past Surgical History:  Procedure Laterality Date  . CIRCUMCISION    . COLONOSCOPY  2007  . CYSTOSCOPY WITH STENT PLACEMENT Right 09/18/2015   Procedure: CYSTOSCOPY WITH STENT PLACEMENT;  Surgeon: Hollice Espy, MD;  Location: [facility] ORS;  Service: Urology;  Laterality: Right;  . CYSTOSCOPY WITH URETEROSCOPY, STONE BASKETRY AND STENT PLACEMENT Right 10/03/2015   Procedure: CYSTOSCOPY WITH URETEROSCOPY, STONE BASKETRY AND STENT PLACEMENT;  Surgeon: Hollice Espy, MD;  Location: [facility] ORS;  Service: Urology;  Laterality: Right;  . EXTRACORPOREAL SHOCK WAVE LITHOTRIPSY Right 09/15/2015   Procedure: EXTRACORPOREAL SHOCK WAVE LITHOTRIPSY (ESWL);  Surgeon: Nickie Retort, MD;  Location: [facility] ORS;  Service: Urology;  Laterality: Right;  . HERNIA REPAIR    . ROTATOR CUFF REPAIR Right 06/2012    Family History  Problem Relation Age of Onset  . Hypertension Mother   . Diabetes Father   . CAD Father   . Diabetes Sister   . Heart disease Sister   . CAD Sister     Social History Social History  Substance Use Topics  . Smoking status: Former Smoker    Packs/day: 2.00    Years: 15.00    Types: Cigarettes    Quit date: 06/25/1974  . Smokeless tobacco: Never Used  . Alcohol use No    No Known  Allergies  Current Outpatient Prescriptions  Medication Sig Dispense Refill  . aspirin 81 MG tablet Take 81 mg by mouth daily.     . Cholecalciferol (VITAMIN D3) 2000 UNITS capsule Take 2,000 Units by mouth daily.    . diphenhydrAMINE (BENADRYL ALLERGY) 25 mg capsule Take by mouth.     [name] glucose blood test strip 1 each by Other route 2 (two) times daily as needed for other. Use as instructed 100 each 12  . hydrochlorothiazide (HYDRODIURIL) 25 MG tablet TAKE 1 TABLET (25 MG TOTAL) BY MOUTH DAILY. 90 tablet 3  . metFORMIN (GLUCOPHAGE) 1000 MG tablet TAKE 1 TABLET (1,000 MG TOTAL) BY MOUTH 2 (TWO) TIMES DAILY WITH A MEAL. 180 tablet 3  . MULTIPLE VITAMIN PO Take by mouth.    . pioglitazone (ACTOS) 15 MG tablet Take 1 tablet (15 mg total) by mouth daily. 90 tablet 3  . ranitidine (ZANTAC) 150 MG tablet Take 1 tablet (150 mg total) by mouth 2 (two) times daily. 180 tablet 3  . rosuvastatin (CRESTOR) 20 MG tablet Take 1 tablet (20 mg total) by mouth daily. 90 tablet 3  . Saw Palmetto, Serenoa repens, (SAW PALMETTO BERRY) 160 MG CAPS Take 2 tablets by mouth daily.     . sildenafil (REVATIO) 20 MG tablet Take 1 tablet (20 mg total) by mouth 3 (three) times daily. (Patient taking differently: Take  20 mg by mouth 3 (three) times daily. ) 50 tablet 12  . telmisartan (MICARDIS) 40 MG tablet Take 1 tablet by mouth daily.    [name] tiZANidine (ZANAFLEX) 4 MG tablet Take 4 mg by mouth at bedtime. Reported on 09/19/2015    . traMADol (ULTRAM) 50 MG tablet Take 1 tablet (50 mg total) by mouth at bedtime. 90 tablet 2   No current facility-administered medications for this visit.     Review of Systems Review of Systems  Constitutional: Negative.   Respiratory: Negative.   Cardiovascular: Negative.   Gastrointestinal: Negative.     Blood pressure (!) 158/76, pulse 88, resp. rate 12, height 6\' 1"  (1.854 m), weight 243 lb (110.2 kg).  Physical Exam Physical Exam  Constitutional: He is oriented to person,  place, and time. He appears well-developed and well-nourished.  HENT:  Mouth/Throat: Oropharynx is clear and moist.  Eyes: Conjunctivae are normal. No scleral icterus.  Neck: Neck supple.  Cardiovascular: Normal rate, regular rhythm and normal heart sounds.   Pulmonary/Chest: Effort normal and breath sounds normal.  Abdominal: Soft.  Lymphadenopathy:    He has no cervical adenopathy.  Neurological: He is alert and oriented to person, place, and time.  Skin: Skin is warm and dry.  Psychiatric: His behavior is normal.    Data Reviewed Prior notes.  Assessment    Encounter for screening colonoscopy. Stable exam.    Plan    Colonoscopy with possible biopsy/polypectomy prn: Information regarding the procedure, including its potential risks and complications (including but not limited to perforation of the bowel, which may require emergency surgery to repair, and bleeding) was verbally given to the patient. Educational information regarding lower intestinal endoscopy was given to the patient. Written instructions for how to complete the bowel prep using Miralax were provided. The importance of drinking ample fluids to avoid dehydration as a result of the prep emphasized.     Patient has been scheduled for a colonoscopy on 08-22-16 at [facility]. This patient has been asked to hold Metformin and Actos day of colonoscopy prep and procedure.    This information has been scribed by Karie Fetch RN, BSN,BC.   Karie Fetch M 08/01/2016, 12:52 PM

## 2016-08-22 NOTE — Interval H&P Note (Signed)
History and Physical Interval Note:  08/22/2016 11:08 AM  Manuel Stafford.  has presented today for surgery, with the diagnosis of SCREENING  The various methods of treatment have been discussed with the patient and family. After consideration of risks, benefits and other options for treatment, the patient has consented to  Procedure(s): COLONOSCOPY WITH PROPOFOL (N/A) as a surgical intervention .  The patient's history has been reviewed, patient examined, no change in status, stable for surgery.  I have reviewed the patient's chart and labs.  Questions were answered to the patient's satisfaction.     [name]

## 2016-08-22 NOTE — Transfer of Care (Signed)
Immediate Anesthesia Transfer of Care Note  Patient: Manuel Stafford.  Procedure(s) Performed: Procedure(s): COLONOSCOPY WITH PROPOFOL (N/A)  Patient Location: PACU and Endoscopy Unit  Anesthesia Type:General  Level of Consciousness: sedated  Airway & Oxygen Therapy: Patient Spontanous Breathing and Patient connected to nasal cannula oxygen  Post-op Assessment: Report given to RN and Post -op Vital signs reviewed and stable  Post vital signs: Reviewed and stable  Last Vitals:  Vitals:   08/22/16 1024 08/22/16 1149  BP: (!) 157/87 105/76  Pulse: 94 82  Resp: 14 17  Temp: 36.9 C     Last Pain:  Vitals:   08/22/16 1024  TempSrc: Tympanic         Complications: No apparent anesthesia complications

## 2016-08-23 ENCOUNTER — Encounter: Payer: Self-pay | Admitting: General Surgery

## 2016-09-29 ENCOUNTER — Other Ambulatory Visit: Payer: Self-pay | Admitting: Family Medicine

## 2016-10-16 ENCOUNTER — Encounter: Payer: Self-pay | Admitting: Family Medicine

## 2016-10-16 ENCOUNTER — Ambulatory Visit (INDEPENDENT_AMBULATORY_CARE_PROVIDER_SITE_OTHER): Payer: PPO | Admitting: Family Medicine

## 2016-10-16 VITALS — BP 124/60 | HR 96 | Temp 97.7°F | Resp 16 | Wt 247.0 lb

## 2016-10-16 DIAGNOSIS — E119 Type 2 diabetes mellitus without complications: Secondary | ICD-10-CM | POA: Diagnosis not present

## 2016-10-16 DIAGNOSIS — M791 Myalgia, unspecified site: Secondary | ICD-10-CM

## 2016-10-16 DIAGNOSIS — M6281 Muscle weakness (generalized): Secondary | ICD-10-CM

## 2016-10-16 DIAGNOSIS — I10 Essential (primary) hypertension: Secondary | ICD-10-CM

## 2016-10-16 NOTE — Patient Instructions (Signed)
Stop Actos 

## 2016-10-16 NOTE — Progress Notes (Signed)
Subjective:  HPI Pt is here today to discuss his Actos. He reports that since he started he has not been feeling well. He has had leg weakness and swelling. He says this has gotten progressively worse. He reports that he can tell a big difference in his legs. He used to could walk a lot and now he can barely walk to the mail box and back. Denies any shortness of breath or chest pain. He also complaining of groin pain. It is located on the left side. He says he was lifting heavy objects and felt this area pull. He reports that he has had this in the past and has always gotten better and it is this time but he can still feel a "tingle" in this area.    Prior to Admission medications   Medication Sig Start Date End Date Taking? Authorizing Provider  aspirin 81 MG tablet Take 81 mg by mouth daily.  04/20/10   Historical Provider, MD  Cholecalciferol (VITAMIN D3) 2000 UNITS capsule Take 2,000 Units by mouth daily.    Historical Provider, MD  diphenhydrAMINE (BENADRYL ALLERGY) 25 mg capsule Take by mouth.     Historical Provider, MD  glucose blood test strip 1 each by Other route 2 (two) times daily as needed for other. Use as instructed 10/17/15   [name], MD  hydrochlorothiazide (HYDRODIURIL) 25 MG tablet TAKE 1 TABLET (25 MG TOTAL) BY MOUTH DAILY. 07/15/16   Nickoli Bagheri Maceo Pro., MD  metFORMIN (GLUCOPHAGE) 1000 MG tablet TAKE 1 TABLET (1,000 MG TOTAL) BY MOUTH 2 (TWO) TIMES DAILY WITH A MEAL. 07/15/16   Evadne Ose Maceo Pro., MD  MULTIPLE VITAMIN PO Take by mouth. 04/20/10   Historical Provider, MD  omeprazole (PRILOSEC) 20 MG capsule Take 1 capsule (20 mg total) by mouth daily. 08/16/16   Alaisa Moffitt Maceo Pro., MD  pioglitazone (ACTOS) 15 MG tablet Take 1 tablet (15 mg total) by mouth daily. 07/24/16   Felicidad Sugarman Maceo Pro., MD  polyethylene glycol powder [facility]) powder 255 grams one bottle for colonoscopy prep 08/01/16   Seeplaputhur Robinette Haines, MD  rosuvastatin (CRESTOR) 20 MG  tablet TAKE 1 TABLET (20 MG TOTAL) BY MOUTH DAILY. 10/01/16   [name], MD  Saw Palmetto, Serenoa repens, (SAW PALMETTO BERRY) 160 MG CAPS Take 2 tablets by mouth daily.  04/20/10   Historical Provider, MD  sildenafil (REVATIO) 20 MG tablet Take 1 tablet (20 mg total) by mouth 3 (three) times daily. Patient taking differently: Take 20 mg by mouth 3 (three) times daily.  03/22/16   Moriah Shawley Maceo Pro., MD  telmisartan (MICARDIS) 40 MG tablet Take 1 tablet by mouth daily. 03/14/16   Corey Skains, MD  tiZANidine (ZANAFLEX) 4 MG tablet Take 4 mg by mouth at bedtime. Reported on 09/19/2015 11/01/14   Historical Provider, MD  traMADol (ULTRAM) 50 MG tablet Take 1 tablet (50 mg total) by mouth at bedtime. 03/21/16   Hadi Dubin Maceo Pro., MD    Patient Active Problem List   Diagnosis Date Noted  . Shoulder pain 03/21/2016  . Flu vaccine need 03/21/2016  . Hydronephrosis with urinary obstruction due to ureteral calculus 09/26/2015  . Microscopic hematuria 09/26/2015  . Hypertensive left ventricular hypertrophy 09/13/2015  . Nephrolithiasis 08/29/2015  . Right ureteral stone 08/29/2015  . Abnormal ECG 08/22/2015  . Benign essential HTN 08/22/2015  . Combined fat and carbohydrate induced hyperlipemia 08/22/2015  . Breathlessness on exertion 08/22/2015  .  Lung nodules 04/28/2015  . Allergic rhinitis 12/31/2014  . Diabetes ([place]) 12/31/2014  . Failure of erection 12/31/2014  . Fatty liver disease, nonalcoholic 38/46/6599  . Acid reflux 12/31/2014  . BP (high blood pressure) 12/31/2014  . HLD (hyperlipidemia) 12/31/2014  . Lumbosacral spondylosis without myelopathy 12/31/2014  . Adiposity 12/31/2014  . Herpes zona 12/31/2014  . Disorder of shoulder 12/31/2014  . Apnea, sleep 12/31/2014  . Avitaminosis D 12/31/2014  . DDD (degenerative disc disease), lumbar 11/30/2013  . Back ache 11/30/2013    Past Medical History:  Diagnosis Date  . Back pain    lower  . Diabetes mellitus  without complication [facility])    Patient takes Metformin.  . Elevated lipids   . GERD (gastroesophageal reflux disease)   . Hemorrhoids 2017  . Hypertension   . Kidney stone 2017    Social History   Social History  . Marital status: Married    Spouse name: N/A  . Number of children: N/A  . Years of education: N/A   Occupational History  . Not on file.   Social History Main Topics  . Smoking status: Former Smoker    Packs/day: 2.00    Years: 15.00    Types: Cigarettes    Quit date: 06/25/1974  . Smokeless tobacco: Never Used  . Alcohol use No  . Drug use: No  . Sexual activity: Not Currently   Other Topics Concern  . Not on file   Social History Narrative  . No narrative on file    No Known Allergies  Review of Systems  HENT: Negative.   Eyes: Negative.   Respiratory: Negative.   Cardiovascular: Negative.   Gastrointestinal: Negative.   Musculoskeletal: Negative.        Groin pain.   Skin: Negative.   Neurological: Positive for weakness (in legs.).  Endo/Heme/Allergies: Negative.   Psychiatric/Behavioral: Negative.     Immunization History  Administered Date(s) Administered  . Hepatitis A 08/13/2006  . Influenza, High Dose Seasonal PF 03/23/2015, 03/21/2016  . Pneumococcal Conjugate-13 05/25/2014  . Pneumococcal Polysaccharide-23 04/08/2012  . Tdap 10/26/2005  . Zoster 08/13/2006, 04/17/2013    Objective:  BP 124/60 (BP Location: Left Arm, Patient Position: Sitting, Cuff Size: Large)   Pulse 96   Temp 97.7 F (36.5 C) (Oral)   Resp 16   Wt 247 lb (112 kg)   BMI 32.59 kg/m   Physical Exam  Constitutional: He is oriented to person, place, and time and well-developed, well-nourished, and in no distress.  HENT:  Head: Normocephalic and atraumatic.  Eyes: Conjunctivae and EOM are normal. Pupils are equal, round, and reactive to light.  Neck: Normal range of motion. Neck supple.  Cardiovascular: Normal rate, regular rhythm, normal heart sounds and  intact distal pulses.   Pulmonary/Chest: Effort normal and breath sounds normal.  Abdominal: Soft. Bowel sounds are normal.  Genitourinary:  Genitourinary Comments: No adenopathy.No hernia.  Musculoskeletal: Normal range of motion.  Neurological: He is alert and oriented to person, place, and time. He has normal reflexes. Gait normal. GCS score is 15.  Skin: Skin is warm and dry.  Psychiatric: Mood, memory, affect and judgment normal.    Lab Results  Component Value Date   WBC 8.7 09/28/2015   HGB 14.8 09/28/2015   HCT 43.0 09/28/2015   PLT 197 09/28/2015   GLUCOSE 148 (H) 10/19/2015   CHOL 116 10/19/2015   TRIG 117 10/19/2015   HDL 28 (L) 10/19/2015   LDLCALC 65 10/19/2015  TSH 2.000 02/22/2015   PSA 0.3 10/14/2014   HGBA1C 8.3 07/24/2016    CMP     Component Value Date/Time   NA 140 10/19/2015 0805   K 4.4 10/19/2015 0805   K 4.3 07/10/2012 0858   CL 99 10/19/2015 0805   CO2 23 10/19/2015 0805   GLUCOSE 148 (H) 10/19/2015 0805   GLUCOSE 151 (H) 09/19/2015 0455   BUN 17 10/19/2015 0805   CREATININE 1.23 10/19/2015 0805   CALCIUM 9.9 10/19/2015 0805   PROT 7.0 10/19/2015 0805   ALBUMIN 4.3 10/19/2015 0805   AST 31 10/19/2015 0805   ALT 35 10/19/2015 0805   ALKPHOS 104 10/19/2015 0805   BILITOT 0.5 10/19/2015 0805   GFRNONAA 59 (L) 10/19/2015 0805   GFRAA 68 10/19/2015 0805    Assessment and Plan :  1. Type 2 diabetes mellitus without complication, without long-term current use of insulin (HCC) Stop Actos now due to possibly causing muscle weakness.  - Comprehensive metabolic panel  2. Benign essential HTN  - CBC with Differential/Platelet  3. Muscle weakness of lower extremity  - CK - Sedimentation rate  4. Myalgia May need to stop statin, statin may be the cause of the muscle weakness. If not better after stopping Actos then will stop statin. If CK is elevated will stop statin.  - Sedimentation rate  HPI, Exam, and A&P Transcribed under the  direction and in the presence of Moataz Tavis L. Cranford Mon, MD  Electronically Signed: Katina Dung, CMA I have done the exam and reviewed the above chart and it is accurate to the best of my knowledge. Development worker, community has been used in this note in any air is in the dictation or transcription are unintentional.  [facility] 10/16/2016 3:54 PM

## 2016-10-17 LAB — SEDIMENTATION RATE: Sed Rate: 25 mm/hr (ref 0–30)

## 2016-10-17 LAB — COMPREHENSIVE METABOLIC PANEL
ALBUMIN: 4.5 g/dL (ref 3.5–4.8)
ALK PHOS: 125 IU/L — AB (ref 39–117)
ALT: 48 IU/L — ABNORMAL HIGH (ref 0–44)
AST: 68 IU/L — ABNORMAL HIGH (ref 0–40)
Albumin/Globulin Ratio: 1.6 (ref 1.2–2.2)
BUN/Creatinine Ratio: 15 (ref 10–24)
BUN: 19 mg/dL (ref 8–27)
Bilirubin Total: 0.4 mg/dL (ref 0.0–1.2)
CO2: 23 mmol/L (ref 18–29)
CREATININE: 1.31 mg/dL — AB (ref 0.76–1.27)
Calcium: 10.1 mg/dL (ref 8.6–10.2)
Chloride: 96 mmol/L (ref 96–106)
GFR calc non Af Amer: 54 mL/min/{1.73_m2} — ABNORMAL LOW (ref >59)
GFR, EST AFRICAN AMERICAN: 62 mL/min/{1.73_m2} (ref >59)
GLOBULIN, TOTAL: 2.8 g/dL (ref 1.5–4.5)
GLUCOSE: 211 mg/dL — AB (ref 65–99)
Potassium: 4.4 mmol/L (ref 3.5–5.2)
SODIUM: 136 mmol/L (ref 134–144)
TOTAL PROTEIN: 7.3 g/dL (ref 6.0–8.5)

## 2016-10-17 LAB — CBC WITH DIFFERENTIAL/PLATELET
BASOS ABS: 0 10*3/uL (ref 0.0–0.2)
Basos: 0 %
EOS (ABSOLUTE): 0.1 10*3/uL (ref 0.0–0.4)
Eos: 1 %
HEMATOCRIT: 45.8 % (ref 37.5–51.0)
HEMOGLOBIN: 15.5 g/dL (ref 13.0–17.7)
IMMATURE GRANS (ABS): 0 10*3/uL (ref 0.0–0.1)
Immature Granulocytes: 0 %
LYMPHS ABS: 3.4 10*3/uL — AB (ref 0.7–3.1)
Lymphs: 36 %
MCH: 32.4 pg (ref 26.6–33.0)
MCHC: 33.8 g/dL (ref 31.5–35.7)
MCV: 96 fL (ref 79–97)
Monocytes Absolute: 0.9 10*3/uL (ref 0.1–0.9)
Monocytes: 9 %
NEUTROS ABS: 5.1 10*3/uL (ref 1.4–7.0)
Neutrophils: 54 %
Platelets: 221 10*3/uL (ref 150–379)
RBC: 4.79 x10E6/uL (ref 4.14–5.80)
RDW: 12.5 % (ref 12.3–15.4)
WBC: 9.4 10*3/uL (ref 3.4–10.8)

## 2016-10-17 LAB — CK: CK TOTAL: 100 U/L (ref 24–204)

## 2016-10-19 DIAGNOSIS — M12811 Other specific arthropathies, not elsewhere classified, right shoulder: Secondary | ICD-10-CM | POA: Diagnosis not present

## 2016-10-19 DIAGNOSIS — M75121 Complete rotator cuff tear or rupture of right shoulder, not specified as traumatic: Secondary | ICD-10-CM | POA: Diagnosis not present

## 2016-10-19 DIAGNOSIS — M7581 Other shoulder lesions, right shoulder: Secondary | ICD-10-CM | POA: Diagnosis not present

## 2016-10-30 ENCOUNTER — Ambulatory Visit: Payer: PPO | Admitting: Family Medicine

## 2016-11-06 DIAGNOSIS — I10 Essential (primary) hypertension: Secondary | ICD-10-CM | POA: Diagnosis not present

## 2016-11-06 DIAGNOSIS — E782 Mixed hyperlipidemia: Secondary | ICD-10-CM | POA: Diagnosis not present

## 2016-11-06 DIAGNOSIS — K219 Gastro-esophageal reflux disease without esophagitis: Secondary | ICD-10-CM | POA: Diagnosis not present

## 2016-11-06 DIAGNOSIS — I119 Hypertensive heart disease without heart failure: Secondary | ICD-10-CM | POA: Diagnosis not present

## 2016-11-08 ENCOUNTER — Ambulatory Visit (INDEPENDENT_AMBULATORY_CARE_PROVIDER_SITE_OTHER): Payer: PPO | Admitting: Family Medicine

## 2016-11-08 VITALS — BP 124/62 | HR 84 | Temp 98.2°F | Resp 16 | Wt 238.0 lb

## 2016-11-08 DIAGNOSIS — E784 Other hyperlipidemia: Secondary | ICD-10-CM | POA: Diagnosis not present

## 2016-11-08 DIAGNOSIS — R748 Abnormal levels of other serum enzymes: Secondary | ICD-10-CM

## 2016-11-08 DIAGNOSIS — E119 Type 2 diabetes mellitus without complications: Secondary | ICD-10-CM

## 2016-11-08 DIAGNOSIS — M6281 Muscle weakness (generalized): Secondary | ICD-10-CM

## 2016-11-08 DIAGNOSIS — K219 Gastro-esophageal reflux disease without esophagitis: Secondary | ICD-10-CM | POA: Diagnosis not present

## 2016-11-08 DIAGNOSIS — E7849 Other hyperlipidemia: Secondary | ICD-10-CM

## 2016-11-08 LAB — POCT GLYCOSYLATED HEMOGLOBIN (HGB A1C): HEMOGLOBIN A1C: 8.1

## 2016-11-08 NOTE — Progress Notes (Signed)
Manuel Stafford.  MRN: [id number] DOB: Jul 23, 1945  Subjective:  HPI  Patient is here for 2 week follow up. Last office visit was on 10/16/16 for muscle weakness. Actos was stopped at that time. Muscle pain and weakness is almost resolved since stopping Actos. Patient is still taking Metformin daily. Patient is checking his sugar and readings have been around 180-195. No change in sugar readings much. Lab Results  Component Value Date   HGBA1C 8.3 07/24/2016    Patient Active Problem List   Diagnosis Date Noted  . Shoulder pain 03/21/2016  . Flu vaccine need 03/21/2016  . Hydronephrosis with urinary obstruction due to ureteral calculus 09/26/2015  . Microscopic hematuria 09/26/2015  . Hypertensive left ventricular hypertrophy 09/13/2015  . Nephrolithiasis 08/29/2015  . Right ureteral stone 08/29/2015  . Abnormal ECG 08/22/2015  . Benign essential HTN 08/22/2015  . Combined fat and carbohydrate induced hyperlipemia 08/22/2015  . Breathlessness on exertion 08/22/2015  . Lung nodules 04/28/2015  . Allergic rhinitis 12/31/2014  . Diabetes ([place]) 12/31/2014  . Failure of erection 12/31/2014  . Fatty liver disease, nonalcoholic 09/32/6712  . Acid reflux 12/31/2014  . BP (high blood pressure) 12/31/2014  . HLD (hyperlipidemia) 12/31/2014  . Lumbosacral spondylosis without myelopathy 12/31/2014  . Adiposity 12/31/2014  . Herpes zona 12/31/2014  . Disorder of shoulder 12/31/2014  . Apnea, sleep 12/31/2014  . Avitaminosis D 12/31/2014  . DDD (degenerative disc disease), lumbar 11/30/2013  . Back ache 11/30/2013    Past Medical History:  Diagnosis Date  . Back pain    lower  . Diabetes mellitus without complication [facility])    Patient takes Metformin.  . Elevated lipids   . GERD (gastroesophageal reflux disease)   . Hemorrhoids 2017  . Hypertension   . Kidney stone 2017    Social History   Social History  . Marital status: Married    Spouse name: N/A  . Number of  children: N/A  . Years of education: N/A   Occupational History  . Not on file.   Social History Main Topics  . Smoking status: Former Smoker    Packs/day: 2.00    Years: 15.00    Types: Cigarettes    Quit date: 06/25/1974  . Smokeless tobacco: Never Used  . Alcohol use No  . Drug use: No  . Sexual activity: Not Currently   Other Topics Concern  . Not on file   Social History Narrative  . No narrative on file    Outpatient Encounter Prescriptions as of 11/08/2016  Medication Sig Note  . aspirin 81 MG tablet Take 81 mg by mouth daily.  12/30/2014: Received from: [facility]  . Cholecalciferol (VITAMIN D3) 2000 UNITS capsule Take 2,000 Units by mouth daily.   . diphenhydrAMINE (BENADRYL ALLERGY) 25 mg capsule Take by mouth.  12/30/2014: Received from: [facility]  . glucose blood test strip 1 each by Other route 2 (two) times daily as needed for other. Use as instructed   . hydrochlorothiazide (HYDRODIURIL) 25 MG tablet TAKE 1 TABLET (25 MG TOTAL) BY MOUTH DAILY.   . metFORMIN (GLUCOPHAGE) 1000 MG tablet TAKE 1 TABLET (1,000 MG TOTAL) BY MOUTH 2 (TWO) TIMES DAILY WITH A MEAL.   . MULTIPLE VITAMIN PO Take by mouth. 12/30/2014: Received from: [facility]  . omeprazole (PRILOSEC) 20 MG capsule Take 1 capsule (20 mg total) by mouth daily.   . polyethylene glycol powder (GLYCOLAX/MIRALAX) powder 255 grams one bottle for colonoscopy prep   .  rosuvastatin (CRESTOR) 20 MG tablet TAKE 1 TABLET (20 MG TOTAL) BY MOUTH DAILY.   . Saw Palmetto, Serenoa repens, (SAW PALMETTO BERRY) 160 MG CAPS Take 2 tablets by mouth daily.  12/30/2014: Received from: [facility]  . sildenafil (REVATIO) 20 MG tablet Take 1 tablet (20 mg total) by mouth 3 (three) times daily.   [name] telmisartan (MICARDIS) 40 MG tablet Take 1 tablet by mouth daily. 03/21/2016: Received from: External Pharmacy  . tiZANidine (ZANAFLEX) 4 MG tablet Take 4 mg by mouth at  bedtime. Reported on 09/19/2015 12/30/2014: Received from: External Pharmacy  . traMADol (ULTRAM) 50 MG tablet Take 1 tablet (50 mg total) by mouth at bedtime.   . [DISCONTINUED] pioglitazone (ACTOS) 15 MG tablet Take 1 tablet (15 mg total) by mouth daily.    No facility-administered encounter medications on file as of 11/08/2016.     No Known Allergies  Review of Systems  Constitutional: Negative.   Respiratory: Negative.   Cardiovascular: Negative.   Gastrointestinal: Negative.   Musculoskeletal: Positive for back pain and joint pain (right shoulder).  Neurological: Negative.   Psychiatric/Behavioral: Negative.     Objective:  BP 124/62   Pulse 84   Temp 98.2 F (36.8 C)   Resp 16   Wt 238 lb (108 kg)   BMI 31.40 kg/m   Physical Exam  Constitutional: He is oriented to person, place, and time and well-developed, well-nourished, and in no distress.  HENT:  Head: Normocephalic and atraumatic.  Eyes: Conjunctivae are normal. Pupils are equal, round, and reactive to light.  Neck: Normal range of motion. Neck supple.  Cardiovascular: Normal rate, regular rhythm, normal heart sounds and intact distal pulses.  Exam reveals no gallop.   No murmur heard. Pulmonary/Chest: Effort normal and breath sounds normal. No respiratory distress. He has no wheezes.  Neurological: He is alert and oriented to person, place, and time.  Psychiatric: Mood, memory, affect and judgment normal.    Assessment and Plan :  1. Muscle weakness of lower extremity Resolved off Actos.  2. Other hyperlipidemia Check level in 1 month. May need to switch medication if liver enzymes do not improve.  3. Gastroesophageal reflux disease, esophagitis presence not specified 4.. Elevated liver enzymes Discussed the cause of this possibly. Mildly elevated.Will re check in 1 month along with Lipid. Advised patient to work on habits, this is probably fatty liver issue but could be coming from statin use. Follow. May  need further work up. 5. DM A1C 8.1 today. Better. Continue current regimen on Metformin and work on habits.  HPI, Exam and A&P transcribed by Theressa Millard, RMA under direction and in the presence of Miguel Aschoff, MD. I have done the exam and reviewed the chart and it is accurate to the best of my knowledge. Development worker, community has been used and  any errors in dictation or transcription are unintentional. Miguel Aschoff M.D. [facility]

## 2016-11-14 ENCOUNTER — Encounter: Payer: Self-pay | Admitting: Family Medicine

## 2016-11-14 ENCOUNTER — Ambulatory Visit (INDEPENDENT_AMBULATORY_CARE_PROVIDER_SITE_OTHER): Payer: PPO | Admitting: Family Medicine

## 2016-11-14 VITALS — BP 116/60 | HR 82 | Temp 97.3°F | Resp 16 | Ht 73.0 in | Wt 240.0 lb

## 2016-11-14 DIAGNOSIS — E784 Other hyperlipidemia: Secondary | ICD-10-CM

## 2016-11-14 DIAGNOSIS — Z0000 Encounter for general adult medical examination without abnormal findings: Secondary | ICD-10-CM

## 2016-11-14 DIAGNOSIS — E7849 Other hyperlipidemia: Secondary | ICD-10-CM

## 2016-11-14 DIAGNOSIS — M6281 Muscle weakness (generalized): Secondary | ICD-10-CM | POA: Diagnosis not present

## 2016-11-14 DIAGNOSIS — I10 Essential (primary) hypertension: Secondary | ICD-10-CM | POA: Diagnosis not present

## 2016-11-14 DIAGNOSIS — E119 Type 2 diabetes mellitus without complications: Secondary | ICD-10-CM | POA: Diagnosis not present

## 2016-11-14 LAB — POCT URINALYSIS DIPSTICK
Bilirubin, UA: NEGATIVE
Blood, UA: NEGATIVE
Glucose, UA: 250
LEUKOCYTES UA: NEGATIVE
Nitrite, UA: NEGATIVE
PROTEIN UA: NEGATIVE
Spec Grav, UA: 1.02 (ref 1.010–1.025)
UROBILINOGEN UA: 0.2 U/dL
pH, UA: 5 (ref 5.0–8.0)

## 2016-11-14 NOTE — Progress Notes (Signed)
Patient: Manuel Brumbaugh., Male    DOB: [birth year], 71 y.o.   MRN: [id number] Visit Date: 11/14/2016  Today's Provider: [name], MD   Chief Complaint  Patient presents with  . Annual Exam   Subjective:    Annual physical exam Manuel Stafford. is a 71 y.o. male who presents today for health maintenance and complete physical. He feels well. He reports exercising 5 days a week, walking on treadmill. He reports he is sleeping well.  ----------------------------------------------------------------- Colonoscopy- 08/22/16 diverticulosis, perianal rash otherwise normal.   Immunization History  Administered Date(s) Administered  . Hepatitis A 08/13/2006  . Influenza, High Dose Seasonal PF 03/23/2015, 03/21/2016  . Pneumococcal Conjugate-13 05/25/2014  . Pneumococcal Polysaccharide-23 04/08/2012  . Tdap 10/26/2005  . Zoster 08/13/2006, 04/17/2013      Review of Systems  Constitutional: Positive for fatigue.  HENT: Negative.   Eyes: Negative.   Respiratory: Negative.   Cardiovascular: Negative.   Gastrointestinal: Negative.   Endocrine: Negative.   Genitourinary: Negative.   Musculoskeletal: Positive for arthralgias, back pain and neck pain.  Skin: Negative.   Allergic/Immunologic: Positive for environmental allergies.  Neurological: Negative.   Hematological: Negative.   Psychiatric/Behavioral: Negative.     Social History      He  reports that he quit smoking about 42 years ago. His smoking use included Cigarettes. He has a 30.00 pack-year smoking history. He has never used smokeless tobacco. He reports that he does not drink alcohol or use drugs.       Social History   Social History  . Marital status: Married    Spouse name: N/A  . Number of children: N/A  . Years of education: N/A   Social History Main Topics  . Smoking status: Former Smoker    Packs/day: 2.00    Years: 15.00    Types: Cigarettes    Quit date: 06/25/1974  .  Smokeless tobacco: Never Used  . Alcohol use No  . Drug use: No  . Sexual activity: Not Currently   Other Topics Concern  . None   Social History Narrative  . None    Past Medical History:  Diagnosis Date  . Back pain    lower  . Diabetes mellitus without complication [facility])    Patient takes Metformin.  . Elevated lipids   . GERD (gastroesophageal reflux disease)   . Hemorrhoids 2017  . Hypertension   . Kidney stone 2017     Patient Active Problem List   Diagnosis Date Noted  . Shoulder pain 03/21/2016  . Flu vaccine need 03/21/2016  . Hydronephrosis with urinary obstruction due to ureteral calculus 09/26/2015  . Microscopic hematuria 09/26/2015  . Hypertensive left ventricular hypertrophy 09/13/2015  . Nephrolithiasis 08/29/2015  . Right ureteral stone 08/29/2015  . Abnormal ECG 08/22/2015  . Benign essential HTN 08/22/2015  . Combined fat and carbohydrate induced hyperlipemia 08/22/2015  . Breathlessness on exertion 08/22/2015  . Lung nodules 04/28/2015  . Allergic rhinitis 12/31/2014  . Diabetes ([place]) 12/31/2014  . Failure of erection 12/31/2014  . Fatty liver disease, nonalcoholic 45/80/9983  . Acid reflux 12/31/2014  . BP (high blood pressure) 12/31/2014  . HLD (hyperlipidemia) 12/31/2014  . Lumbosacral spondylosis without myelopathy 12/31/2014  . Adiposity 12/31/2014  . Herpes zona 12/31/2014  . Disorder of shoulder 12/31/2014  . Apnea, sleep 12/31/2014  . Avitaminosis D 12/31/2014  . DDD (degenerative disc disease), lumbar 11/30/2013  . Back ache 11/30/2013  Past Surgical History:  Procedure Laterality Date  . CIRCUMCISION    . COLONOSCOPY  2007  . COLONOSCOPY WITH PROPOFOL N/A 08/22/2016   Procedure: COLONOSCOPY WITH PROPOFOL;  Surgeon: Christene Lye, MD;  Location: [facility] ENDOSCOPY;  Service: Endoscopy;  Laterality: N/A;  . CYSTOSCOPY WITH STENT PLACEMENT Right 09/18/2015   Procedure: CYSTOSCOPY WITH STENT PLACEMENT;  Surgeon: Hollice Espy, MD;  Location: [facility] ORS;  Service: Urology;  Laterality: Right;  . CYSTOSCOPY WITH URETEROSCOPY, STONE BASKETRY AND STENT PLACEMENT Right 10/03/2015   Procedure: CYSTOSCOPY WITH URETEROSCOPY, STONE BASKETRY AND STENT PLACEMENT;  Surgeon: Hollice Espy, MD;  Location: [facility] ORS;  Service: Urology;  Laterality: Right;  . EXTRACORPOREAL SHOCK WAVE LITHOTRIPSY Right 09/15/2015   Procedure: EXTRACORPOREAL SHOCK WAVE LITHOTRIPSY (ESWL);  Surgeon: Nickie Retort, MD;  Location: [facility] ORS;  Service: Urology;  Laterality: Right;  . HERNIA REPAIR    . ROTATOR CUFF REPAIR Right 06/2012    Family History        Family Status  Relation Status  . Mother Deceased at age 13  . Father Deceased at age 57       cardiac issues  . Sister Deceased at age 74       from complications of heart disease and DM        His family history includes CAD in his father and sister; Diabetes in his father and sister; Heart disease in his sister; Hypertension in his mother.     Allergies  Allergen Reactions  . Actos [Pioglitazone]     Muscle weakness     Current Outpatient Prescriptions:  .  aspirin 81 MG tablet, Take 81 mg by mouth daily. , Disp: , Rfl:  .  Cholecalciferol (VITAMIN D3) 2000 UNITS capsule, Take 2,000 Units by mouth daily., Disp: , Rfl:  .  diphenhydrAMINE (BENADRYL ALLERGY) 25 mg capsule, Take by mouth. , Disp: , Rfl:  .  glucose blood test strip, 1 each by Other route 2 (two) times daily as needed for other. Use as instructed, Disp: 100 each, Rfl: 12 .  hydrochlorothiazide (HYDRODIURIL) 25 MG tablet, TAKE 1 TABLET (25 MG TOTAL) BY MOUTH DAILY., Disp: 90 tablet, Rfl: 3 .  metFORMIN (GLUCOPHAGE) 1000 MG tablet, TAKE 1 TABLET (1,000 MG TOTAL) BY MOUTH 2 (TWO) TIMES DAILY WITH A MEAL., Disp: 180 tablet, Rfl: 3 .  MULTIPLE VITAMIN PO, Take by mouth., Disp: , Rfl:  .  omeprazole (PRILOSEC) 20 MG capsule, Take 1 capsule (20 mg total) by mouth daily., Disp: 30 capsule, Rfl: 3 .  polyethylene  glycol powder (GLYCOLAX/MIRALAX) powder, 255 grams one bottle for colonoscopy prep, Disp: 255 g, Rfl: 0 .  rosuvastatin (CRESTOR) 20 MG tablet, TAKE 1 TABLET (20 MG TOTAL) BY MOUTH DAILY., Disp: 90 tablet, Rfl: 3 .  Saw Palmetto, Serenoa repens, (SAW PALMETTO BERRY) 160 MG CAPS, Take 2 tablets by mouth daily. , Disp: , Rfl:  .  sildenafil (REVATIO) 20 MG tablet, Take 1 tablet (20 mg total) by mouth 3 (three) times daily., Disp: 50 tablet, Rfl: 12 .  telmisartan (MICARDIS) 40 MG tablet, Take 1 tablet by mouth daily., Disp: , Rfl:  .  tiZANidine (ZANAFLEX) 4 MG tablet, Take 4 mg by mouth at bedtime. Reported on 09/19/2015, Disp: , Rfl:  .  traMADol (ULTRAM) 50 MG tablet, Take 1 tablet (50 mg total) by mouth at bedtime., Disp: 90 tablet, Rfl: 2   Patient Care Team: [name], MD as PCP - General (Family Medicine)  Thelma Comp, OD as Consulting Physician (Optometry) Corey Skains, MD as Consulting Physician (Cardiology) Ralene Bathe, MD as Consulting Physician (Dermatology) Hollice Espy, MD as Consulting Physician (Urology) [name], MD (Family Medicine) Christene Lye, MD (General Surgery)      Objective:   Vitals: BP 116/60 (BP Location: Left Arm, Patient Position: Sitting, Cuff Size: Large)   Pulse 82   Temp 97.3 F (36.3 C) (Oral)   Resp 16   Ht 6\' 1"  (1.854 m)   Wt 240 lb (108.9 kg)   BMI 31.66 kg/m    Vitals:   11/14/16 1015  BP: 116/60  Pulse: 82  Resp: 16  Temp: 97.3 F (36.3 C)  TempSrc: Oral  Weight: 240 lb (108.9 kg)  Height: 6\' 1"  (1.854 m)     Physical Exam  Constitutional: He is oriented to person, place, and time. He appears well-developed and well-nourished.  HENT:  Head: Normocephalic and atraumatic.  Right Ear: External ear normal.  Left Ear: External ear normal.  Nose: Nose normal.  Mouth/Throat: Oropharynx is clear and moist.  Eyes: Conjunctivae and EOM are normal.  Neck: Normal range of motion. Neck  supple.  Cardiovascular: Normal rate, regular rhythm, normal heart sounds and intact distal pulses.   Pulmonary/Chest: Effort normal and breath sounds normal.  Abdominal: Soft. Bowel sounds are normal.  Musculoskeletal: Normal range of motion.  Neurological: He is alert and oriented to person, place, and time. He has normal reflexes.  Skin: Skin is warm.  Psychiatric: He has a normal mood and affect. His behavior is normal. Judgment and thought content normal.     Depression Screen PHQ 2/9 Scores 07/24/2016 02/07/2015  PHQ - 2 Score 0 0      Assessment & Plan:     Routine Health Maintenance and Physical Exam  Exercise Activities and Dietary recommendations Goals    . Exercise 150 minutes per week (moderate activity)          Starting in spring 2018, I will start walking daily for 30-45 minutes.        Immunization History  Administered Date(s) Administered  . Hepatitis A 08/13/2006  . Influenza, High Dose Seasonal PF 03/23/2015, 03/21/2016  . Pneumococcal Conjugate-13 05/25/2014  . Pneumococcal Polysaccharide-23 04/08/2012  . Tdap 10/26/2005  . Zoster 08/13/2006, 04/17/2013    Health Maintenance  Topic Date Due  . TETANUS/TDAP  06/25/2017 (Originally 10/27/2015)  . INFLUENZA VACCINE  01/23/2017  . OPHTHALMOLOGY EXAM  01/23/2017  . HEMOGLOBIN A1C  05/11/2017  . FOOT EXAM  07/24/2017  . COLONOSCOPY  08/22/2026  . Hepatitis C Screening  Completed  . PNA vac Low Risk Adult  Completed     Discussed health benefits of physical activity, and encouraged him to engage in regular exercise appropriate for his age and condition.    -------------------------------------------------------------------- 1. Annual physical exam  - POCT urinalysis dipstick  2. Other hyperlipidemia  3. Type 2 diabetes mellitus without complication, without long-term current use of insulin ([place])   4. Benign essential HTN  5. Muscle weakness of lower extremity Will get A1c, lipids, and CK  in August.  Much better off of Actos.   I have done the exam and reviewed the chart and it is accurate to the best of my knowledge. Development worker, community has been used and  any errors in dictation or transcription are unintentional. Miguel Aschoff M.D. [place], MD  [facility]  [facility]  

## 2016-12-04 ENCOUNTER — Encounter: Payer: Self-pay | Admitting: Family Medicine

## 2016-12-11 ENCOUNTER — Ambulatory Visit: Payer: PPO | Admitting: Family Medicine

## 2016-12-14 ENCOUNTER — Other Ambulatory Visit: Payer: Self-pay | Admitting: Family Medicine

## 2016-12-14 DIAGNOSIS — M5136 Other intervertebral disc degeneration, lumbar region: Secondary | ICD-10-CM

## 2016-12-14 MED ORDER — TRAMADOL HCL 50 MG PO TABS: 50.0000 mg | ORAL_TABLET | Freq: Every day | ORAL | 2 refills | Status: DC

## 2016-12-14 MED ORDER — OMEPRAZOLE 20 MG PO CPDR: 20.0000 mg | DELAYED_RELEASE_CAPSULE | Freq: Every day | ORAL | 3 refills | Status: DC

## 2016-12-14 NOTE — Telephone Encounter (Signed)
Pt walks in ask request a refill on the following medications. [facility]. Thanks CC   traMADol (ULTRAM) 50 MG tablet  omeprazole (PRILOSEC) 20 MG capsule [[id number]]

## 2017-01-24 DIAGNOSIS — H2513 Age-related nuclear cataract, bilateral: Secondary | ICD-10-CM | POA: Diagnosis not present

## 2017-01-24 DIAGNOSIS — E119 Type 2 diabetes mellitus without complications: Secondary | ICD-10-CM | POA: Diagnosis not present

## 2017-01-24 DIAGNOSIS — H1852 Epithelial (juvenile) corneal dystrophy: Secondary | ICD-10-CM | POA: Diagnosis not present

## 2017-01-24 LAB — HM DIABETES EYE EXAM

## 2017-02-11 ENCOUNTER — Ambulatory Visit: Payer: PPO | Admitting: Family Medicine

## 2017-02-11 ENCOUNTER — Ambulatory Visit (INDEPENDENT_AMBULATORY_CARE_PROVIDER_SITE_OTHER): Payer: PPO | Admitting: Family Medicine

## 2017-02-11 ENCOUNTER — Encounter: Payer: Self-pay | Admitting: Family Medicine

## 2017-02-11 VITALS — BP 124/74 | HR 89 | Temp 97.9°F | Resp 16 | Wt 240.4 lb

## 2017-02-11 DIAGNOSIS — G3281 Cerebellar ataxia in diseases classified elsewhere: Secondary | ICD-10-CM | POA: Diagnosis not present

## 2017-02-11 DIAGNOSIS — R2681 Unsteadiness on feet: Secondary | ICD-10-CM

## 2017-02-11 DIAGNOSIS — R42 Dizziness and giddiness: Secondary | ICD-10-CM | POA: Diagnosis not present

## 2017-02-11 NOTE — Progress Notes (Signed)
Subjective:     Patient ID: Manuel Patella., male   DOB: 04/16/1946, 71 y.o.   MRN: [id number]  HPI  Chief Complaint  Patient presents with  . Fatigue    Patient comes in offcie today with complaints of feeling week and disoriented for the past 3 days. Patient reports symptoms of feeling off balance when walking, headache, sinus pressure and nasal drainage.   Reports transient vertigo two days ago. States his sugar is about 200 where he normally runs and chronic clear sinus congestion. Typically takes two Benadryl or Tylenol  PM at bedtime for his sinuses. Also takes a tramadol and tizandine at bedtime as well for chronic shoulder pain. Most concerned about feeling unsteady with walking. Wife accompanies today. No hx of TIA or CVA.   Review of Systems     Objective:   Physical Exam  Constitutional: He appears well-developed and well-nourished. He appears distressed.  HENT:  Ear canals patent with intact T.M.'s.  Eyes: Pupils are equal, round, and reactive to light. EOM are normal.  Cardiovascular:  Pulses:      Carotid pulses are 2+ on the right side, and 2+ on the left side. Neurological: He is alert. Coordination (Heel to toe unsteady though did not  fall. Finger to nose WNL; Romberg negative) abnormal.       Assessment:    1. Dizziness: ? Central ? peripheral - Comprehensive metabolic panel - CT Head Wo Contrast; Future  2. Unsteady gait - CT Head Wo Contrast; Future  3. Cerebellar ataxia in diseases classified elsewhere [facility]) - CT Head Wo Contrast; Future    Plan:    Further f/u pending labs and CT scan results. Has f/u with Dr. Rosanna Randy on  8/23.

## 2017-02-11 NOTE — Patient Instructions (Signed)
We will call you with the lab results and time of CT scan.

## 2017-02-12 ENCOUNTER — Telehealth: Payer: Self-pay

## 2017-02-12 LAB — COMPREHENSIVE METABOLIC PANEL
A/G RATIO: 1.6 (ref 1.2–2.2)
ALT: 44 IU/L (ref 0–44)
AST: 51 IU/L — AB (ref 0–40)
Albumin: 4.3 g/dL (ref 3.5–4.8)
Alkaline Phosphatase: 105 IU/L (ref 39–117)
BILIRUBIN TOTAL: 0.8 mg/dL (ref 0.0–1.2)
BUN/Creatinine Ratio: 14 (ref 10–24)
BUN: 17 mg/dL (ref 8–27)
CHLORIDE: 97 mmol/L (ref 96–106)
CO2: 21 mmol/L (ref 20–29)
Calcium: 9.6 mg/dL (ref 8.6–10.2)
Creatinine, Ser: 1.18 mg/dL (ref 0.76–1.27)
GFR calc Af Amer: 71 mL/min/{1.73_m2} (ref >59)
GFR calc non Af Amer: 61 mL/min/{1.73_m2} (ref >59)
GLUCOSE: 238 mg/dL — AB (ref 65–99)
Globulin, Total: 2.7 g/dL (ref 1.5–4.5)
POTASSIUM: 4.5 mmol/L (ref 3.5–5.2)
Sodium: 137 mmol/L (ref 134–144)
Total Protein: 7 g/dL (ref 6.0–8.5)

## 2017-02-12 NOTE — Telephone Encounter (Signed)
Patient has been advised. KW 

## 2017-02-12 NOTE — Telephone Encounter (Signed)
-----   Message from Carmon Ginsberg, [place] sent at 02/12/2017  7:38 AM EDT ----- Labs ok

## 2017-02-14 ENCOUNTER — Ambulatory Visit (INDEPENDENT_AMBULATORY_CARE_PROVIDER_SITE_OTHER): Payer: PPO | Admitting: Family Medicine

## 2017-02-14 ENCOUNTER — Encounter: Payer: Self-pay | Admitting: Family Medicine

## 2017-02-14 VITALS — BP 110/64 | HR 100 | Temp 97.9°F | Resp 16 | Wt 240.0 lb

## 2017-02-14 DIAGNOSIS — I639 Cerebral infarction, unspecified: Secondary | ICD-10-CM | POA: Diagnosis not present

## 2017-02-14 DIAGNOSIS — R42 Dizziness and giddiness: Secondary | ICD-10-CM | POA: Diagnosis not present

## 2017-02-14 DIAGNOSIS — E119 Type 2 diabetes mellitus without complications: Secondary | ICD-10-CM

## 2017-02-14 LAB — POCT GLYCOSYLATED HEMOGLOBIN (HGB A1C)
Est. average glucose Bld gHb Est-mCnc: 189
Hemoglobin A1C: 8.2

## 2017-02-14 NOTE — Progress Notes (Signed)
Patient: Manuel Stafford. Male    DOB: 02/19/1946   71 y.o.   MRN: [id number] Visit Date: 02/14/2017  Today's Provider: [name], MD   Chief Complaint  Patient presents with  . Diabetes    follow up  . Hypertension    follow up  . Hyperlipidemia    follow up   Subjective:    HPI   Diabetes Mellitus Type II, Follow-up:   Lab Results  Component Value Date   HGBA1C 8.1 11/08/2016   HGBA1C 8.3 07/24/2016   HGBA1C 7.0 03/21/2016   Last seen for diabetes 3 months ago.  Management since then includes no change. He reports good compliance with treatment. He is not having side effects.  Current symptoms include visual disturbances and have been stable. Home blood sugar records: fasting range: 180-200  Episodes of hypoglycemia? no   Current Insulin Regimen: none Most Recent Eye Exam: <1 year ago Weight trend: stable Prior visit with dietician: no Current diet: well balanced Current exercise: walking  ------------------------------------------------------------------------   Hypertension, follow-up:  BP Readings from Last 3 Encounters:  02/11/17 124/74  11/14/16 116/60  11/08/16 124/62    He was last seen for hypertension 3 months ago.  BP at that visit was 124/62. Management since that visit includes no changes.He reports good compliance with treatment. He is not having side effects.  He is exercising. He is not adherent to low salt diet.   Outside blood pressures are 128/78. He is experiencing dyspnea, fatigue and near-syncope.  Patient denies chest pain, claudication, exertional chest pressure/discomfort, irregular heart beat, orthopnea, palpitations, paroxysmal nocturnal dyspnea, syncope and tachypnea.   Cardiovascular risk factors include advanced age (older than 92 for men, 49 for women), diabetes mellitus, dyslipidemia, hypertension and male gender.  Use of agents associated with hypertension: NSAIDS.    ------------------------------------------------------------------------    Lipid/Cholesterol, Follow-up:   Last seen for this 3 months ago.  Management since that visit includes no changes.  Last Lipid Panel:    Component Value Date/Time   CHOL 116 10/19/2015 0805   TRIG 117 10/19/2015 0805   HDL 28 (L) 10/19/2015 0805   LDLCALC 65 10/19/2015 0805    He reports good compliance with treatment. He is not having side effects.   Wt Readings from Last 3 Encounters:  02/11/17 240 lb 6.4 oz (109 kg)  11/14/16 240 lb (108.9 kg)  11/08/16 238 lb (108 kg)    ------------------------------------------------------------------------ Follow up of Dizziness:  Patient was seen 3 days ago by Carmon Ginsberg PA-C for dizziness.Labs were ordered and were normal. CT was also ordered and has been scheduled for 02/18/2017. Today patient comes in reporting symptoms have worsened. Patient states he found an old bottle of Meclizine at home and tried taking them, which provided no relief.     Allergies  Allergen Reactions  . Actos [Pioglitazone]     Muscle weakness     Current Outpatient Prescriptions:  .  aspirin 81 MG tablet, Take 81 mg by mouth daily. , Disp: , Rfl:  .  Cholecalciferol (VITAMIN D3) 2000 UNITS capsule, Take 2,000 Units by mouth daily., Disp: , Rfl:  .  diphenhydrAMINE (BENADRYL ALLERGY) 25 mg capsule, Take by mouth. , Disp: , Rfl:  .  glucose blood test strip, 1 each by Other route 2 (two) times daily as needed for other. Use as instructed, Disp: 100 each, Rfl: 12 .  hydrochlorothiazide (HYDRODIURIL) 25 MG tablet, TAKE 1 TABLET (25 MG  TOTAL) BY MOUTH DAILY., Disp: 90 tablet, Rfl: 3 .  metFORMIN (GLUCOPHAGE) 1000 MG tablet, TAKE 1 TABLET (1,000 MG TOTAL) BY MOUTH 2 (TWO) TIMES DAILY WITH A MEAL., Disp: 180 tablet, Rfl: 3 .  MULTIPLE VITAMIN PO, Take by mouth., Disp: , Rfl:  .  omeprazole (PRILOSEC) 20 MG capsule, Take 1 capsule (20 mg total) by mouth daily., Disp: 90  capsule, Rfl: 3 .  rosuvastatin (CRESTOR) 20 MG tablet, TAKE 1 TABLET (20 MG TOTAL) BY MOUTH DAILY., Disp: 90 tablet, Rfl: 3 .  Saw Palmetto, Serenoa repens, (SAW PALMETTO BERRY) 160 MG CAPS, Take 2 tablets by mouth daily. , Disp: , Rfl:  .  sildenafil (REVATIO) 20 MG tablet, Take 1 tablet (20 mg total) by mouth 3 (three) times daily., Disp: 50 tablet, Rfl: 12 .  telmisartan (MICARDIS) 40 MG tablet, Take 1 tablet by mouth daily., Disp: , Rfl:  .  tiZANidine (ZANAFLEX) 4 MG tablet, Take 4 mg by mouth at bedtime. Reported on 09/19/2015, Disp: , Rfl:  .  traMADol (ULTRAM) 50 MG tablet, Take 1 tablet (50 mg total) by mouth at bedtime., Disp: 90 tablet, Rfl: 2  Review of Systems  Constitutional: Positive for fatigue. Negative for appetite change, chills and fever.  HENT: Negative.   Eyes: Negative.   Respiratory: Negative for chest tightness, shortness of breath and wheezing.   Cardiovascular: Negative for chest pain and palpitations.  Gastrointestinal: Negative for abdominal pain, nausea and vomiting.  Endocrine: Negative.   Allergic/Immunologic: Negative.   Neurological: Positive for dizziness.  Hematological: Negative.   Psychiatric/Behavioral: Negative.     Social History  Substance Use Topics  . Smoking status: Former Smoker    Packs/day: 2.00    Years: 15.00    Types: Cigarettes    Quit date: 06/25/1974  . Smokeless tobacco: Never Used  . Alcohol use No   Objective:   BP 110/64 (BP Location: Right Arm, Patient Position: Sitting, Cuff Size: Large)   Pulse 100   Temp 97.9 F (36.6 C) (Oral)   Resp 16   Wt 240 lb (108.9 kg)   SpO2 95% Comment: room air  BMI 31.66 kg/m  There were no vitals filed for this visit.   Physical Exam  Constitutional: He is oriented to person, place, and time. He appears well-developed and well-nourished.  HENT:  Head: Normocephalic and atraumatic.  Right Ear: External ear normal.  Left Ear: External ear normal.  Nose: Nose normal.   Mouth/Throat: Oropharynx is clear and moist.  Eyes: Conjunctivae are normal. No scleral icterus.  Neck: No thyromegaly present.  Cardiovascular: Normal rate, regular rhythm and normal heart sounds.   Pulmonary/Chest: Effort normal and breath sounds normal.  Abdominal: Soft.  Neurological: He is alert and oriented to person, place, and time. No cranial nerve deficit. He exhibits normal muscle tone. Coordination normal.  Gait normal,no nystagmus.  Skin: Skin is warm and dry.  Psychiatric: He has a normal mood and affect. His behavior is normal. Judgment and thought content normal.        Assessment & Plan:     1. Type 2 diabetes mellitus without complication, unspecified whether long term insulin use ([facility])  - POCT HgB A1C--8.2 today.  2. Dizziness  - US Carotid Duplex Bilateral  3. Cerebrovascular accident (CVA), unspecified mechanism ([place])  - US Carotid Duplex Bilateral      I have done the exam and reviewed the above chart and it is accurate to the best of my knowledge. Diplomatic Services operational officer  technology has been used in this note in any air is in the dictation or transcription are unintentional. I have done the exam and reviewed the chart and it is accurate to the best of my knowledge. Development worker, community has been used and  any errors in dictation or transcription are unintentional. Miguel Aschoff M.D. [place], MD  [facility]

## 2017-02-18 ENCOUNTER — Ambulatory Visit
Admission: RE | Admit: 2017-02-18 | Discharge: 2017-02-18 | Disposition: A | Discharge status: Home / Self Care | Payer: PPO | Source: Ambulatory Visit | Attending: Family Medicine | Admitting: Family Medicine

## 2017-02-18 DIAGNOSIS — G119 Hereditary ataxia, unspecified: Secondary | ICD-10-CM | POA: Insufficient documentation

## 2017-02-18 DIAGNOSIS — G3281 Cerebellar ataxia in diseases classified elsewhere: Secondary | ICD-10-CM

## 2017-02-18 DIAGNOSIS — R42 Dizziness and giddiness: Secondary | ICD-10-CM

## 2017-02-18 DIAGNOSIS — R55 Syncope and collapse: Secondary | ICD-10-CM | POA: Diagnosis not present

## 2017-02-18 DIAGNOSIS — R2681 Unsteadiness on feet: Secondary | ICD-10-CM

## 2017-02-19 ENCOUNTER — Telehealth: Payer: Self-pay

## 2017-02-19 ENCOUNTER — Ambulatory Visit
Admission: RE | Admit: 2017-02-19 | Discharge: 2017-02-19 | Disposition: A | Discharge status: Home / Self Care | Payer: PPO | Source: Ambulatory Visit | Attending: Family Medicine | Admitting: Family Medicine

## 2017-02-19 DIAGNOSIS — I6521 Occlusion and stenosis of right carotid artery: Secondary | ICD-10-CM | POA: Insufficient documentation

## 2017-02-19 DIAGNOSIS — I6523 Occlusion and stenosis of bilateral carotid arteries: Secondary | ICD-10-CM | POA: Diagnosis not present

## 2017-02-19 DIAGNOSIS — R42 Dizziness and giddiness: Secondary | ICD-10-CM | POA: Diagnosis present

## 2017-02-19 DIAGNOSIS — I639 Cerebral infarction, unspecified: Secondary | ICD-10-CM | POA: Diagnosis present

## 2017-02-19 NOTE — Telephone Encounter (Signed)
Patient advised.KW 

## 2017-02-19 NOTE — Telephone Encounter (Signed)
-----   Message from Carmon Ginsberg, [place] sent at 02/18/2017  3:55 PM EDT ----- CT scan ok. Continue to f/u with Dr. Rosanna Randy.

## 2017-02-19 NOTE — Telephone Encounter (Signed)
[id number] 

## 2017-02-20 ENCOUNTER — Telehealth: Payer: Self-pay

## 2017-02-20 ENCOUNTER — Ambulatory Visit (INDEPENDENT_AMBULATORY_CARE_PROVIDER_SITE_OTHER): Payer: PPO | Admitting: Family Medicine

## 2017-02-20 VITALS — BP 124/62 | HR 80 | Temp 98.8°F | Resp 10 | Wt 246.0 lb

## 2017-02-20 DIAGNOSIS — I639 Cerebral infarction, unspecified: Secondary | ICD-10-CM

## 2017-02-20 DIAGNOSIS — R42 Dizziness and giddiness: Secondary | ICD-10-CM | POA: Diagnosis not present

## 2017-02-20 DIAGNOSIS — E119 Type 2 diabetes mellitus without complications: Secondary | ICD-10-CM | POA: Diagnosis not present

## 2017-02-20 NOTE — Progress Notes (Signed)
Manuel Stafford.  MRN: [id number] DOB: August 09, 1945  Subjective:  HPI   Patient is here for follow up on dizziness. Originally was seen for this issue on 02/11/17 by Worthy Flank that time CT head was ordered and [facility] and that was ok. Then he followed up with [place] on 02/14/17 and [place] of carotids was ordered -which showed no significant blockage. No medications have been prescribed for this issue. Patient states that dizziness is better-he gets dizzy with moving too fast. He still has a "foggy" feeling in his head, just does not feel right, feels like he is "in a haze". No syncope, no vomiting, no weakness, no chest pain, no shortness of breath. He does have some blurred vision and is not sure if maybe he needs new glasses and related to that. Patient did start usign Rhinocort on Monday this week August 27th and feels better since using that.   Patient Active Problem List   Diagnosis Date Noted  . Shoulder pain 03/21/2016  . Flu vaccine need 03/21/2016  . Hydronephrosis with urinary obstruction due to ureteral calculus 09/26/2015  . Microscopic hematuria 09/26/2015  . Hypertensive left ventricular hypertrophy 09/13/2015  . Nephrolithiasis 08/29/2015  . Right ureteral stone 08/29/2015  . Abnormal ECG 08/22/2015  . Benign essential HTN 08/22/2015  . Combined fat and carbohydrate induced hyperlipemia 08/22/2015  . Breathlessness on exertion 08/22/2015  . Lung nodules 04/28/2015  . Allergic rhinitis 12/31/2014  . Diabetes ([place]) 12/31/2014  . Failure of erection 12/31/2014  . Fatty liver disease, nonalcoholic 02/05/4817  . Acid reflux 12/31/2014  . BP (high blood pressure) 12/31/2014  . HLD (hyperlipidemia) 12/31/2014  . Lumbosacral spondylosis without myelopathy 12/31/2014  . Adiposity 12/31/2014  . Herpes zona 12/31/2014  . Disorder of shoulder 12/31/2014  . Apnea, sleep 12/31/2014  . Avitaminosis D 12/31/2014  . DDD (degenerative disc disease), lumbar 11/30/2013  . Back ache  11/30/2013    Past Medical History:  Diagnosis Date  . Back pain    lower  . Diabetes mellitus without complication [facility])    Patient takes Metformin.  . Elevated lipids   . GERD (gastroesophageal reflux disease)   . Hemorrhoids 2017  . Hypertension   . Kidney stone 2017    Social History   Social History  . Marital status: Married    Spouse name: N/A  . Number of children: N/A  . Years of education: N/A   Occupational History  . Not on file.   Social History Main Topics  . Smoking status: Former Smoker    Packs/day: 2.00    Years: 15.00    Types: Cigarettes    Quit date: 06/25/1974  . Smokeless tobacco: Never Used  . Alcohol use No  . Drug use: No  . Sexual activity: Not Currently   Other Topics Concern  . Not on file   Social History Narrative  . No narrative on file    Outpatient Encounter Prescriptions as of 02/20/2017  Medication Sig Note  . aspirin 81 MG tablet Take 81 mg by mouth daily.  12/30/2014: Received from: [facility]  . budesonide (RHINOCORT ALLERGY) 32 MCG/ACT nasal spray Place into both nostrils daily.   . Cholecalciferol (VITAMIN D3) 2000 UNITS capsule Take 2,000 Units by mouth daily.   . diphenhydrAMINE (BENADRYL ALLERGY) 25 mg capsule Take by mouth.  12/30/2014: Received from: [facility]  . glucose blood test strip 1 each by Other route 2 (two) times daily as needed for other.  Use as instructed   . hydrochlorothiazide (HYDRODIURIL) 25 MG tablet TAKE 1 TABLET (25 MG TOTAL) BY MOUTH DAILY.   . metFORMIN (GLUCOPHAGE) 1000 MG tablet TAKE 1 TABLET (1,000 MG TOTAL) BY MOUTH 2 (TWO) TIMES DAILY WITH A MEAL.   . MULTIPLE VITAMIN PO Take by mouth. 12/30/2014: Received from: [facility]  . omeprazole (PRILOSEC) 20 MG capsule Take 1 capsule (20 mg total) by mouth daily.   . rosuvastatin (CRESTOR) 20 MG tablet TAKE 1 TABLET (20 MG TOTAL) BY MOUTH DAILY.   . Saw Palmetto, Serenoa repens, (SAW PALMETTO BERRY)  160 MG CAPS Take 2 tablets by mouth daily.  12/30/2014: Received from: [facility]  . sildenafil (REVATIO) 20 MG tablet Take 1 tablet (20 mg total) by mouth 3 (three) times daily.   [name] telmisartan (MICARDIS) 40 MG tablet Take 1 tablet by mouth daily. 03/21/2016: Received from: External Pharmacy  . tiZANidine (ZANAFLEX) 4 MG tablet Take 4 mg by mouth at bedtime. Reported on 09/19/2015 12/30/2014: Received from: External Pharmacy  . traMADol (ULTRAM) 50 MG tablet Take 1 tablet (50 mg total) by mouth at bedtime.    No facility-administered encounter medications on file as of 02/20/2017.     Allergies  Allergen Reactions  . Actos [Pioglitazone]     Muscle weakness    Review of Systems  Constitutional: Negative.   HENT: Positive for congestion.   Eyes: Positive for blurred vision. Negative for double vision, photophobia and pain.  Respiratory: Negative.   Cardiovascular: Negative.   Gastrointestinal: Negative.   Musculoskeletal: Positive for joint pain (right shoulder).  Neurological: Positive for dizziness. Negative for tingling and headaches.       Chronic numbness in the feet    Objective:  BP 124/62   Pulse 80   Temp 98.8 F (37.1 C)   Resp 10   Wt 246 lb (111.6 kg)   BMI 32.46 kg/m   Physical Exam  Constitutional: He is oriented to person, place, and time and well-developed, well-nourished, and in no distress.  HENT:  Head: Normocephalic and atraumatic.  Right Ear: External ear normal.  Left Ear: External ear normal.  Nose: Nose normal.  Eyes: Conjunctivae are normal.  Neck: Neck supple.  Cardiovascular: Normal rate, regular rhythm and normal heart sounds.   Pulmonary/Chest: Effort normal and breath sounds normal.  Abdominal: Soft.  Neurological: He is alert and oriented to person, place, and time. Gait normal. GCS score is 15.  Skin: Skin is warm and dry.  Psychiatric: Mood, memory, affect and judgment normal.    Assessment and Plan :  1.  Dizziness/vertigo Better. Follow up as needed for this. 2. Type 2 diabetes mellitus without complication, unspecified whether long term insulin use ([place]) Follow up in 4 months  3. Cerebrovascular accident (CVA), unspecified mechanism (HCC)/TIA  HPI, Exam and A&P transcribed by Tiffany Kocher, RMA under direction and in the presence of Miguel Aschoff, MD.

## 2017-02-20 NOTE — Telephone Encounter (Signed)
-----   Message from [name], MD sent at 02/20/2017  8:29 AM EDT ----- No significant carotid blockage.

## 2017-02-20 NOTE — Telephone Encounter (Signed)
Pt advised.   Thanks,   -Cline Draheim  

## 2017-02-22 ENCOUNTER — Encounter: Payer: Self-pay | Admitting: Family Medicine

## 2017-03-21 ENCOUNTER — Ambulatory Visit (INDEPENDENT_AMBULATORY_CARE_PROVIDER_SITE_OTHER): Payer: PPO

## 2017-03-21 DIAGNOSIS — Z23 Encounter for immunization: Secondary | ICD-10-CM

## 2017-03-26 IMAGING — CR DG ABDOMEN 1V
1 series · 1 of 1 positions shown · non-contrast
Comparison: KUB of 08/24/2015 and CT abdomen pelvis of 08/23/2015

CLINICAL DATA: Preop for right study kidney stone

EXAM:
ABDOMEN - 1 VIEW

[abdomen kub]
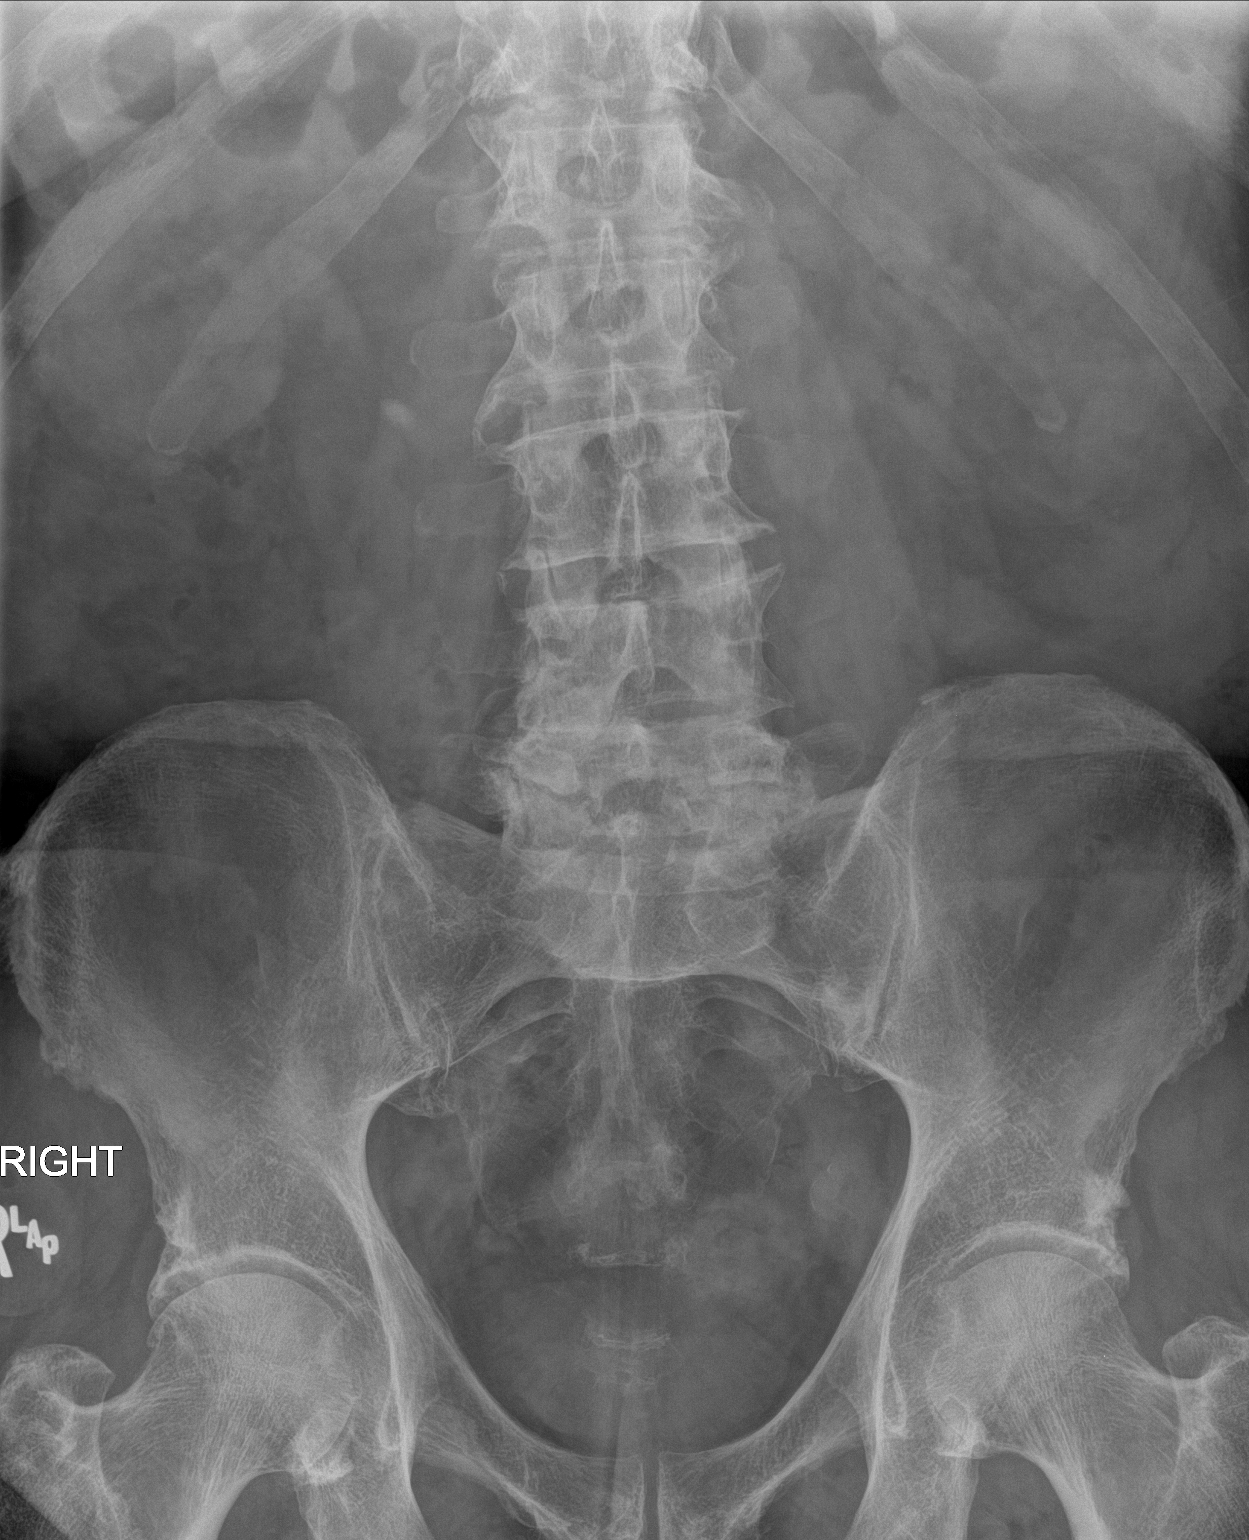

[1 of 1 positions shown; findings below may reference images not displayed]

FINDINGS: Compared to the KUB and CT, the proximal right ureteral calculus of
7 x 9 x 10 mm does not appear to have changed in position, still
lying over the expected proximal right ureter. No additional renal
or ureteral calculi are seen. The bowel gas pattern is nonspecific.
There are degenerative changes throughout the lumbar spine and in
both hips.
IMPRESSION: No apparent change in position of 7 x 9 x 10 mm proximal right
ureteral calculus.

## 2017-04-06 IMAGING — CR DG ABDOMEN 1V
1 series · 1 of 1 positions shown · non-contrast
Comparison: CT scan of September 18, 2015. Radiograph September 15, 2015.

CLINICAL DATA: Right kidney stones.

EXAM:
ABDOMEN - 1 VIEW

[dg abd 1 view]
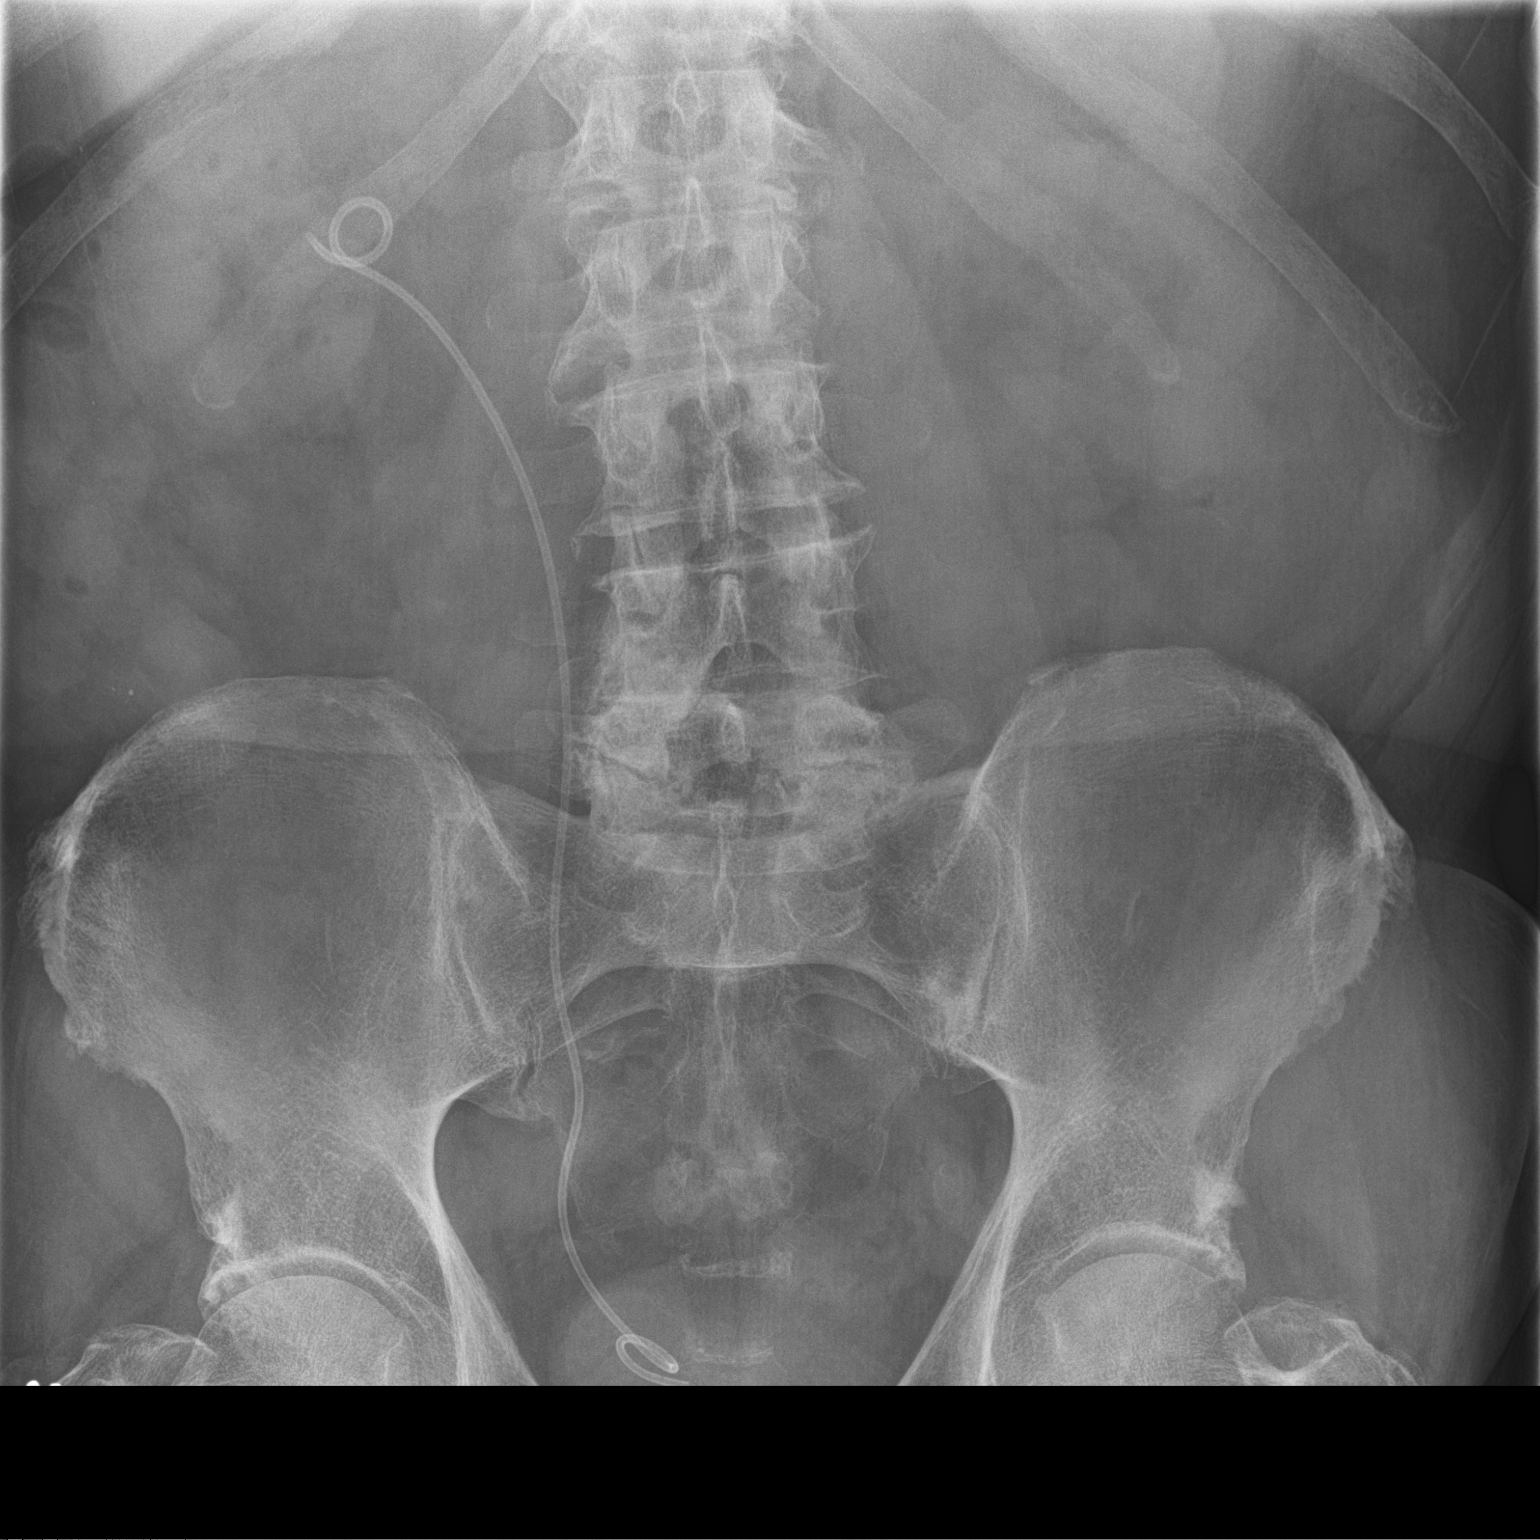

[1 of 1 positions shown; findings below may reference images not displayed]

FINDINGS: The bowel gas pattern is normal. Right-sided ureteral stent is seen
in grossly good position. No definite renal or ureteral calculi are
seen at this time.
IMPRESSION: Right-sided ureteral stent is in grossly good position. No definite
evidence of renal or ureteral calculi seen at this time.

## 2017-04-17 ENCOUNTER — Other Ambulatory Visit: Payer: Self-pay | Admitting: Family Medicine

## 2017-05-20 DIAGNOSIS — M5416 Radiculopathy, lumbar region: Secondary | ICD-10-CM | POA: Diagnosis not present

## 2017-05-20 DIAGNOSIS — M5136 Other intervertebral disc degeneration, lumbar region: Secondary | ICD-10-CM | POA: Diagnosis not present

## 2017-05-27 ENCOUNTER — Encounter: Payer: Self-pay | Admitting: Family Medicine

## 2017-05-27 ENCOUNTER — Ambulatory Visit: Payer: PPO | Admitting: Family Medicine

## 2017-05-27 VITALS — BP 100/58 | HR 105 | Temp 97.6°F | Resp 17 | Wt 237.0 lb

## 2017-05-27 DIAGNOSIS — M5136 Other intervertebral disc degeneration, lumbar region: Secondary | ICD-10-CM

## 2017-05-27 MED ORDER — TRAMADOL HCL 50 MG PO TABS: ORAL_TABLET | ORAL | 1 refills | Status: DC

## 2017-05-27 NOTE — Patient Instructions (Signed)
Let Dr. Rosanna Randy if your pain is not improving.

## 2017-05-27 NOTE — Progress Notes (Signed)
Subjective:     Patient ID: Manuel Patella., male   DOB: [birth year], 71 y.o.   MRN: [id number] Chief Complaint  Patient presents with  . Leg Problem    Patient reports cramping in his left leg since 05/24/17, patient states that pain is usally triggered when he lays down.    HPI Describes a "pulling, dull pain in his left posterior thigh. States it feels better today. Had recent injection in his back per pain clinic for DDD and spinal stenosis. Usually uses Tramadol once daily but requests to use it twice daily if necessary.  Review of Systems     Objective:   Physical Exam  Constitutional: He appears well-developed and well-nourished. No distress.  Cardiovascular:  Pulses:      Dorsalis pedis pulses are 2+ on the left side.       Posterior tibial pulses are 2+ on the left side.  No distal edema   Musculoskeletal:  Muscle strength in lower extremities 5/5. SLR's to 80- degrees without radiation of back pain. No consistent ischial tenderness       Assessment:    1. DDD (degenerative disc disease), lumbar - traMADol (ULTRAM) 50 MG tablet; One pill 3 x day as needed for back or leg pain  Dispense: 180 tablet; Refill: 1    Plan:    F/u with primary M.D., Manuel Stafford, as scheduled 12/12.

## 2017-06-05 ENCOUNTER — Ambulatory Visit: Payer: PPO | Admitting: Family Medicine

## 2017-06-05 ENCOUNTER — Encounter: Payer: Self-pay | Admitting: Family Medicine

## 2017-06-05 VITALS — BP 120/62 | HR 82 | Temp 97.6°F | Resp 16 | Wt 245.0 lb

## 2017-06-05 DIAGNOSIS — G4733 Obstructive sleep apnea (adult) (pediatric): Secondary | ICD-10-CM

## 2017-06-05 DIAGNOSIS — I10 Essential (primary) hypertension: Secondary | ICD-10-CM | POA: Diagnosis not present

## 2017-06-05 DIAGNOSIS — R918 Other nonspecific abnormal finding of lung field: Secondary | ICD-10-CM | POA: Diagnosis not present

## 2017-06-05 DIAGNOSIS — N201 Calculus of ureter: Secondary | ICD-10-CM

## 2017-06-05 DIAGNOSIS — M5136 Other intervertebral disc degeneration, lumbar region: Secondary | ICD-10-CM

## 2017-06-05 DIAGNOSIS — E7800 Pure hypercholesterolemia, unspecified: Secondary | ICD-10-CM | POA: Diagnosis not present

## 2017-06-05 DIAGNOSIS — E119 Type 2 diabetes mellitus without complications: Secondary | ICD-10-CM | POA: Diagnosis not present

## 2017-06-05 LAB — POCT GLYCOSYLATED HEMOGLOBIN (HGB A1C): Hemoglobin A1C: 8.4

## 2017-06-05 MED ORDER — TIZANIDINE HCL 4 MG PO TABS: 4.0000 mg | ORAL_TABLET | Freq: Every day | ORAL | 5 refills | Status: DC

## 2017-06-05 MED ORDER — TRAMADOL HCL 50 MG PO TABS: ORAL_TABLET | ORAL | 5 refills | Status: DC

## 2017-06-05 NOTE — Progress Notes (Signed)
Patient: Manuel Stafford. Male    DOB: 10-15-1945   71 y.o.   MRN: [id number] Visit Date: 06/05/2017  Today's Provider: [name], MD   Chief Complaint  Patient presents with  . Diabetes   Subjective:    HPI   Diabetes Mellitus Type II, Follow-up:   Lab Results  Component Value Date   HGBA1C 8.2 02/14/2017   HGBA1C 8.1 11/08/2016   HGBA1C 8.3 07/24/2016    Last seen for diabetes 4 months ago.  Management since then includes none. He reports good compliance with treatment. He is not having side effects.  Home blood sugar records: 200's  Episodes of hypoglycemia? no   Current Insulin Regimen: n/a Most Recent Eye Exam: 01/24/17  Pertinent Labs:    Component Value Date/Time   CHOL 116 10/19/2015 0805   TRIG 117 10/19/2015 0805   HDL 28 (L) 10/19/2015 0805   LDLCALC 65 10/19/2015 0805   CREATININE 1.18 02/11/2017 1004    Wt Readings from Last 3 Encounters:  06/05/17 245 lb (111.1 kg)  05/27/17 237 lb (107.5 kg)  02/20/17 246 lb (111.6 kg)    ------------------------------------------------------------------------  Pt reports that he needs refills on Tramadol and Tizanidine. Mikki Santee had given him a refill on Tramadol when he saw him at the beginning of December but he lost the rx.      Allergies  Allergen Reactions  . Actos [Pioglitazone]     Muscle weakness     Current Outpatient Medications:  .  aspirin 81 MG tablet, Take 81 mg by mouth daily. , Disp: , Rfl:  .  Cholecalciferol (VITAMIN D3) 2000 UNITS capsule, Take 2,000 Units by mouth daily., Disp: , Rfl:  .  diphenhydrAMINE (BENADRYL ALLERGY) 25 mg capsule, Take by mouth. , Disp: , Rfl:  .  glucose blood test strip, 1 each by Other route 2 (two) times daily as needed for other. Use as instructed, Disp: 100 each, Rfl: 12 .  hydrochlorothiazide (HYDRODIURIL) 25 MG tablet, TAKE 1 TABLET (25 MG TOTAL) BY MOUTH DAILY., Disp: 90 tablet, Rfl: 3 .  metFORMIN (GLUCOPHAGE) 1000 MG tablet, TAKE  1 TABLET (1,000 MG TOTAL) BY MOUTH 2 (TWO) TIMES DAILY WITH A MEAL., Disp: 180 tablet, Rfl: 3 .  MULTIPLE VITAMIN PO, Take by mouth., Disp: , Rfl:  .  omeprazole (PRILOSEC) 20 MG capsule, Take 1 capsule (20 mg total) by mouth daily., Disp: 90 capsule, Rfl: 3 .  rosuvastatin (CRESTOR) 20 MG tablet, TAKE 1 TABLET (20 MG TOTAL) BY MOUTH DAILY., Disp: 90 tablet, Rfl: 3 .  Saw Palmetto, Serenoa repens, (SAW PALMETTO BERRY) 160 MG CAPS, Take 2 tablets by mouth daily. , Disp: , Rfl:  .  telmisartan (MICARDIS) 40 MG tablet, Take 1 tablet by mouth daily., Disp: , Rfl:  .  tiZANidine (ZANAFLEX) 4 MG tablet, Take 4 mg by mouth at bedtime. Reported on 09/19/2015, Disp: , Rfl:  .  traMADol (ULTRAM) 50 MG tablet, One pill 3 x day as needed for back or leg pain, Disp: 180 tablet, Rfl: 1 .  budesonide (RHINOCORT ALLERGY) 32 MCG/ACT nasal spray, Place into both nostrils daily., Disp: , Rfl:  .  sildenafil (REVATIO) 20 MG tablet, TAKE 1 TABLET BY MOUTH 3 TIMES DAILY, Disp: 50 tablet, Rfl: 11 .  Sodium Selenite POWD, Take by mouth., Disp: , Rfl:   Review of Systems  Constitutional: Negative.   HENT: Negative.   Eyes: Negative.   Respiratory: Negative.  Cardiovascular: Negative.   Gastrointestinal: Negative.   Endocrine: Negative.   Genitourinary: Negative.   Musculoskeletal: Positive for arthralgias and back pain.  Skin: Negative.   Allergic/Immunologic: Negative.   Neurological: Negative.   Hematological: Negative.   Psychiatric/Behavioral: Negative.     Social History   Tobacco Use  . Smoking status: Former Smoker    Packs/day: 2.00    Years: 15.00    Pack years: 30.00    Types: Cigarettes    Last attempt to quit: 06/25/1974    Years since quitting: 42.9  . Smokeless tobacco: Never Used  Substance Use Topics  . Alcohol use: No   Objective:   BP 120/62 (BP Location: Left Arm, Patient Position: Sitting, Cuff Size: Large)   Pulse 82   Temp 97.6 F (36.4 C) (Oral)   Resp 16   Wt 245 lb  (111.1 kg)   BMI 32.32 kg/m  Vitals:   06/05/17 1100  BP: 120/62  Pulse: 82  Resp: 16  Temp: 97.6 F (36.4 C)  TempSrc: Oral  Weight: 245 lb (111.1 kg)     Physical Exam  Constitutional: He is oriented to person, place, and time. He appears well-developed and well-nourished.  HENT:  Head: Normocephalic and atraumatic.  Right Ear: External ear normal.  Left Ear: External ear normal.  Nose: Nose normal.  Eyes: Conjunctivae and EOM are normal. Pupils are equal, round, and reactive to light.  Neck: Normal range of motion. Neck supple. No thyromegaly present.  Cardiovascular: Normal rate, regular rhythm, normal heart sounds and intact distal pulses.  Pulmonary/Chest: Effort normal and breath sounds normal.  Abdominal: Soft. He exhibits no distension.  Musculoskeletal: Normal range of motion.  Neurological: He is alert and oriented to person, place, and time. He has normal reflexes.  Skin: Skin is warm and dry.  Psychiatric: He has a normal mood and affect. His behavior is normal. Judgment and thought content normal.        Assessment & Plan:      1. Type 2 diabetes mellitus without complication, unspecified whether long term insulin use (HCC)  - POCT HgB A1C 8.4 today. Continue diet and exercise.   2. DDD (degenerative disc disease), lumbar Per Dr Sharlet Salina.  3. Benign essential HTN   4. Pure hypercholesterolemia   5. Lung nodules  - CT Chest Wo Contrast; Future     HPI, Exam, and A&P Transcribed under the direction and in the presence of Richard L. Cranford Mon, MD  Electronically Signed: Katina Dung, [place], MD  [facility]

## 2017-06-14 ENCOUNTER — Ambulatory Visit: Payer: PPO

## 2017-07-01 ENCOUNTER — Ambulatory Visit: Payer: PPO

## 2017-07-09 ENCOUNTER — Ambulatory Visit
Admission: RE | Admit: 2017-07-09 | Discharge: 2017-07-09 | Disposition: A | Discharge status: Home / Self Care | Payer: PPO | Source: Ambulatory Visit | Attending: Family Medicine | Admitting: Family Medicine

## 2017-07-09 DIAGNOSIS — I700 Atherosclerosis of aorta: Secondary | ICD-10-CM | POA: Diagnosis not present

## 2017-07-09 DIAGNOSIS — R918 Other nonspecific abnormal finding of lung field: Secondary | ICD-10-CM | POA: Diagnosis not present

## 2017-07-09 DIAGNOSIS — J432 Centrilobular emphysema: Secondary | ICD-10-CM | POA: Diagnosis not present

## 2017-07-10 ENCOUNTER — Telehealth: Payer: Self-pay | Admitting: Emergency Medicine

## 2017-07-10 NOTE — Telephone Encounter (Signed)
LMTCB

## 2017-07-10 NOTE — Telephone Encounter (Signed)
-----   Message from [name], MD sent at 07/09/2017  3:27 PM EST ----- Stable lung nodules. No f/u needed on this.

## 2017-07-10 NOTE — Telephone Encounter (Signed)
Patient advised as directed below.  Thanks,  -Naveen Clardy 

## 2017-07-11 DIAGNOSIS — M5416 Radiculopathy, lumbar region: Secondary | ICD-10-CM | POA: Diagnosis not present

## 2017-07-11 DIAGNOSIS — M5136 Other intervertebral disc degeneration, lumbar region: Secondary | ICD-10-CM | POA: Diagnosis not present

## 2017-07-29 ENCOUNTER — Other Ambulatory Visit: Payer: Self-pay | Admitting: Family Medicine

## 2017-07-29 DIAGNOSIS — M5136 Other intervertebral disc degeneration, lumbar region: Secondary | ICD-10-CM | POA: Diagnosis not present

## 2017-07-29 DIAGNOSIS — M5416 Radiculopathy, lumbar region: Secondary | ICD-10-CM | POA: Diagnosis not present

## 2017-08-08 ENCOUNTER — Ambulatory Visit
Admission: RE | Admit: 2017-08-08 | Discharge: 2017-08-08 | Disposition: A | Discharge status: Home / Self Care | Payer: PPO | Source: Ambulatory Visit | Attending: Family Medicine | Admitting: Family Medicine

## 2017-08-08 DIAGNOSIS — M5126 Other intervertebral disc displacement, lumbar region: Secondary | ICD-10-CM | POA: Insufficient documentation

## 2017-08-08 DIAGNOSIS — M545 Low back pain: Secondary | ICD-10-CM | POA: Diagnosis not present

## 2017-08-08 DIAGNOSIS — M5416 Radiculopathy, lumbar region: Secondary | ICD-10-CM | POA: Diagnosis present

## 2017-08-08 DIAGNOSIS — M48061 Spinal stenosis, lumbar region without neurogenic claudication: Secondary | ICD-10-CM | POA: Diagnosis not present

## 2017-08-13 ENCOUNTER — Encounter: Payer: Self-pay | Admitting: Family Medicine

## 2017-08-13 ENCOUNTER — Ambulatory Visit (INDEPENDENT_AMBULATORY_CARE_PROVIDER_SITE_OTHER): Payer: PPO | Admitting: Family Medicine

## 2017-08-13 VITALS — BP 118/66 | HR 84 | Temp 98.1°F | Resp 16 | Wt 239.0 lb

## 2017-08-13 DIAGNOSIS — K5792 Diverticulitis of intestine, part unspecified, without perforation or abscess without bleeding: Secondary | ICD-10-CM

## 2017-08-13 MED ORDER — DOXYCYCLINE HYCLATE 100 MG PO TABS: 100.0000 mg | ORAL_TABLET | Freq: Two times a day (BID) | ORAL | 0 refills | Status: DC

## 2017-08-13 NOTE — Patient Instructions (Signed)

## 2017-08-13 NOTE — Progress Notes (Signed)
Patient: Manuel Stafford. Male    DOB: [birth year]   72 y.o.   MRN: [id number] Visit Date: 08/13/2017  Today's Provider: [name], MD   Chief Complaint  Patient presents with  . Hernia    possibly    Subjective:    HPI Patient comes in today c/o possible inguinal hernia. He reports that he has had this before, but this time is has gone on for about 3 days. He has been taking Aleve to help with the symptoms.     Allergies  Allergen Reactions  . Actos [Pioglitazone]     Muscle weakness     Current Outpatient Medications:  .  aspirin 81 MG tablet, Take 81 mg by mouth daily. , Disp: , Rfl:  .  budesonide (RHINOCORT ALLERGY) 32 MCG/ACT nasal spray, Place into both nostrils daily., Disp: , Rfl:  .  Cholecalciferol (VITAMIN D3) 2000 UNITS capsule, Take 2,000 Units by mouth daily., Disp: , Rfl:  .  diphenhydrAMINE (BENADRYL ALLERGY) 25 mg capsule, Take by mouth. , Disp: , Rfl:  .  glucose blood test strip, 1 each by Other route 2 (two) times daily as needed for other. Use as instructed, Disp: 100 each, Rfl: 12 .  hydrochlorothiazide (HYDRODIURIL) 25 MG tablet, TAKE 1 TABLET (25 MG TOTAL) BY MOUTH DAILY., Disp: 90 tablet, Rfl: 3 .  metFORMIN (GLUCOPHAGE) 1000 MG tablet, TAKE 1 TABLET (1,000 MG TOTAL) BY MOUTH 2 (TWO) TIMES DAILY WITH A MEAL., Disp: 180 tablet, Rfl: 3 .  MULTIPLE VITAMIN PO, Take by mouth., Disp: , Rfl:  .  omeprazole (PRILOSEC) 20 MG capsule, Take 1 capsule (20 mg total) by mouth daily., Disp: 90 capsule, Rfl: 3 .  rosuvastatin (CRESTOR) 20 MG tablet, TAKE 1 TABLET (20 MG TOTAL) BY MOUTH DAILY., Disp: 90 tablet, Rfl: 3 .  Saw Palmetto, Serenoa repens, (SAW PALMETTO BERRY) 160 MG CAPS, Take 2 tablets by mouth daily. , Disp: , Rfl:  .  sildenafil (REVATIO) 20 MG tablet, TAKE 1 TABLET BY MOUTH 3 TIMES DAILY, Disp: 50 tablet, Rfl: 11 .  Sodium Selenite POWD, Take by mouth., Disp: , Rfl:  .  telmisartan (MICARDIS) 40 MG tablet, Take 1 tablet by mouth  daily., Disp: , Rfl:  .  tiZANidine (ZANAFLEX) 4 MG tablet, Take 1 tablet (4 mg total) by mouth at bedtime. Reported on 09/19/2015, Disp: 30 tablet, Rfl: 5 .  traMADol (ULTRAM) 50 MG tablet, One pill 3 x day as needed for back or leg pain, Disp: 180 tablet, Rfl: 5  Review of Systems  Constitutional: Negative.   Eyes: Negative.   Respiratory: Negative.   Endocrine: Negative.   Genitourinary: Negative for decreased urine volume, difficulty urinating, discharge, dysuria, enuresis, flank pain, frequency, genital sores, hematuria, penile pain, penile swelling, scrotal swelling, testicular pain and urgency.  Musculoskeletal: Positive for arthralgias and back pain.  Allergic/Immunologic: Negative.   Neurological: Negative.   Psychiatric/Behavioral: Negative.     Social History   Tobacco Use  . Smoking status: Former Smoker    Packs/day: 2.00    Years: 15.00    Pack years: 30.00    Types: Cigarettes    Last attempt to quit: 06/25/1974    Years since quitting: 43.1  . Smokeless tobacco: Never Used  Substance Use Topics  . Alcohol use: No   Objective:   BP 118/66 (BP Location: Right Arm, Patient Position: Sitting, Cuff Size: Large)   Pulse 84   Temp 98.1 F (  36.7 C)   Resp 16   Wt 239 lb (108.4 kg)   BMI 31.53 kg/m  Vitals:   08/13/17 1611  BP: 118/66  Pulse: 84  Resp: 16  Temp: 98.1 F (36.7 C)  Weight: 239 lb (108.4 kg)     Physical Exam  Constitutional: He is oriented to person, place, and time. He appears well-developed and well-nourished.  HENT:  Head: Normocephalic and atraumatic.  Eyes: Conjunctivae are normal. No scleral icterus.  Neck: No thyromegaly present.  Cardiovascular: Normal rate, regular rhythm and normal heart sounds.  Pulmonary/Chest: Effort normal and breath sounds normal.  Abdominal: Soft. Bowel sounds are normal. There is tenderness.  LLQ tenderness without rebound.  Neurological: He is alert and oriented to person, place, and time.  Skin: Skin  is warm and dry.  Psychiatric: He has a normal mood and affect. His behavior is normal. Judgment and thought content normal.        Assessment & Plan:     1. Diverticulitis Treat as below. F/U in 1 week to reassess.  - doxycycline (VIBRA-TABS) 100 MG tablet; Take 1 tablet (100 mg total) by mouth 2 (two) times daily.  Dispense: 14 tablet; Refill: 0        Davene Jobin Cranford Mon, MD  [facility]

## 2017-08-20 ENCOUNTER — Ambulatory Visit (INDEPENDENT_AMBULATORY_CARE_PROVIDER_SITE_OTHER): Payer: PPO | Admitting: Family Medicine

## 2017-08-20 ENCOUNTER — Encounter: Payer: Self-pay | Admitting: Family Medicine

## 2017-08-20 VITALS — BP 118/60 | HR 101 | Temp 97.7°F | Resp 16 | Ht 73.0 in | Wt 239.0 lb

## 2017-08-20 DIAGNOSIS — E119 Type 2 diabetes mellitus without complications: Secondary | ICD-10-CM | POA: Diagnosis not present

## 2017-08-20 DIAGNOSIS — I10 Essential (primary) hypertension: Secondary | ICD-10-CM | POA: Diagnosis not present

## 2017-08-20 DIAGNOSIS — K5732 Diverticulitis of large intestine without perforation or abscess without bleeding: Secondary | ICD-10-CM | POA: Diagnosis not present

## 2017-08-20 MED ORDER — DOXYCYCLINE HYCLATE 100 MG PO TABS: 100.0000 mg | ORAL_TABLET | Freq: Two times a day (BID) | ORAL | 0 refills | Status: DC

## 2017-08-20 NOTE — Progress Notes (Signed)
Patient: Manuel Stafford. Male    DOB: 08-24-1945   72 y.o.   MRN: [id number] Visit Date: 08/20/2017  Today's Provider: [name], MD   Chief Complaint  Patient presents with  . Follow-up   Subjective:    HPI  Diverticulitis From 08/13/2017-F/U in 1 week to reassess. Given doxycycline (VIBRA-TABS) 100 MG tablet.0  Patient completed doxycycline this morning. Patient states he feels a lot better.     Allergies  Allergen Reactions  . Actos [Pioglitazone]     Muscle weakness     Current Outpatient Medications:  .  aspirin 81 MG tablet, Take 81 mg by mouth daily. , Disp: , Rfl:  .  budesonide (RHINOCORT ALLERGY) 32 MCG/ACT nasal spray, Place into both nostrils daily., Disp: , Rfl:  .  Cholecalciferol (VITAMIN D3) 2000 UNITS capsule, Take 2,000 Units by mouth daily., Disp: , Rfl:  .  diphenhydrAMINE (BENADRYL ALLERGY) 25 mg capsule, Take by mouth. , Disp: , Rfl:  .  glucose blood test strip, 1 each by Other route 2 (two) times daily as needed for other. Use as instructed, Disp: 100 each, Rfl: 12 .  hydrochlorothiazide (HYDRODIURIL) 25 MG tablet, TAKE 1 TABLET (25 MG TOTAL) BY MOUTH DAILY., Disp: 90 tablet, Rfl: 3 .  metFORMIN (GLUCOPHAGE) 1000 MG tablet, TAKE 1 TABLET (1,000 MG TOTAL) BY MOUTH 2 (TWO) TIMES DAILY WITH A MEAL., Disp: 180 tablet, Rfl: 3 .  MULTIPLE VITAMIN PO, Take by mouth., Disp: , Rfl:  .  omeprazole (PRILOSEC) 20 MG capsule, Take 1 capsule (20 mg total) by mouth daily., Disp: 90 capsule, Rfl: 3 .  rosuvastatin (CRESTOR) 20 MG tablet, TAKE 1 TABLET (20 MG TOTAL) BY MOUTH DAILY., Disp: 90 tablet, Rfl: 3 .  Saw Palmetto, Serenoa repens, (SAW PALMETTO BERRY) 160 MG CAPS, Take 2 tablets by mouth daily. , Disp: , Rfl:  .  sildenafil (REVATIO) 20 MG tablet, TAKE 1 TABLET BY MOUTH 3 TIMES DAILY, Disp: 50 tablet, Rfl: 11 .  Sodium Selenite POWD, Take by mouth., Disp: , Rfl:  .  telmisartan (MICARDIS) 40 MG tablet, Take 1 tablet by mouth daily.,  Disp: , Rfl:  .  tiZANidine (ZANAFLEX) 4 MG tablet, Take 1 tablet (4 mg total) by mouth at bedtime. Reported on 09/19/2015, Disp: 30 tablet, Rfl: 5 .  traMADol (ULTRAM) 50 MG tablet, One pill 3 x day as needed for back or leg pain, Disp: 180 tablet, Rfl: 5 .  doxycycline (VIBRA-TABS) 100 MG tablet, Take 1 tablet (100 mg total) by mouth 2 (two) times daily. (Patient not taking: Reported on 08/20/2017), Disp: 14 tablet, Rfl: 0  Review of Systems  Constitutional: Negative for appetite change, chills and fever.  Eyes: Negative.   Respiratory: Negative for chest tightness, shortness of breath and wheezing.   Cardiovascular: Negative for chest pain and palpitations.  Gastrointestinal: Negative for abdominal pain, nausea and vomiting.  Endocrine: Negative.   Genitourinary: Negative.   Allergic/Immunologic: Negative.   Neurological: Negative.   Hematological: Negative.   Psychiatric/Behavioral: Negative.     Social History   Tobacco Use  . Smoking status: Former Smoker    Packs/day: 2.00    Years: 15.00    Pack years: 30.00    Types: Cigarettes    Last attempt to quit: 06/25/1974    Years since quitting: 43.1  . Smokeless tobacco: Never Used  Substance Use Topics  . Alcohol use: No   Objective:   BP 118/60 (BP  Location: Right Arm, Patient Position: Sitting, Cuff Size: Large)   Pulse (!) 101   Resp 16   Ht 6\' 1"  (1.854 m)   Wt 239 lb (108.4 kg)   SpO2 95%   BMI 31.53 kg/m  Vitals:   08/20/17 1456  BP: 118/60  Pulse: (!) 101  Resp: 16  SpO2: 95%  Weight: 239 lb (108.4 kg)  Height: 6\' 1"  (1.854 m)     Physical Exam  Constitutional: He is oriented to person, place, and time. He appears well-developed and well-nourished.  HENT:  Head: Normocephalic.  Eyes: Conjunctivae are normal. No scleral icterus.  Neck: No thyromegaly present.  Cardiovascular: Normal rate and regular rhythm.  Pulmonary/Chest: Effort normal.  Abdominal: Soft. He exhibits no mass. There is no tenderness.  There is no guarding.  Musculoskeletal: He exhibits no edema.  Neurological: He is alert and oriented to person, place, and time.  Skin: Skin is warm and dry.  Psychiatric: He has a normal mood and affect. His behavior is normal. Judgment and thought content normal.        Assessment & Plan:      Diverticulitis Clinically fully resolved.  No further follow-up TIIDM      [name], MD  [facility]

## 2017-08-20 NOTE — Patient Instructions (Signed)
Refilled doxycycline for return symptoms of diverticulitis.  Return for follow-up as scheduled.

## 2017-08-27 DIAGNOSIS — M5442 Lumbago with sciatica, left side: Secondary | ICD-10-CM | POA: Diagnosis not present

## 2017-08-27 DIAGNOSIS — Z6832 Body mass index (BMI) 32.0-32.9, adult: Secondary | ICD-10-CM | POA: Diagnosis not present

## 2017-08-27 DIAGNOSIS — I10 Essential (primary) hypertension: Secondary | ICD-10-CM | POA: Diagnosis not present

## 2017-08-27 DIAGNOSIS — G8929 Other chronic pain: Secondary | ICD-10-CM | POA: Diagnosis not present

## 2017-09-01 ENCOUNTER — Other Ambulatory Visit: Payer: Self-pay | Admitting: Family Medicine

## 2017-09-02 DIAGNOSIS — M5416 Radiculopathy, lumbar region: Secondary | ICD-10-CM | POA: Diagnosis not present

## 2017-09-02 DIAGNOSIS — M5136 Other intervertebral disc degeneration, lumbar region: Secondary | ICD-10-CM | POA: Diagnosis not present

## 2017-09-23 ENCOUNTER — Ambulatory Visit (INDEPENDENT_AMBULATORY_CARE_PROVIDER_SITE_OTHER): Payer: PPO

## 2017-09-23 ENCOUNTER — Telehealth: Payer: Self-pay

## 2017-09-23 VITALS — BP 140/68 | HR 80 | Temp 98.0°F | Ht 73.0 in | Wt 236.4 lb

## 2017-09-23 DIAGNOSIS — Z0000 Encounter for general adult medical examination without abnormal findings: Secondary | ICD-10-CM | POA: Diagnosis not present

## 2017-09-23 DIAGNOSIS — K5792 Diverticulitis of intestine, part unspecified, without perforation or abscess without bleeding: Secondary | ICD-10-CM

## 2017-09-23 MED ORDER — DOXYCYCLINE HYCLATE 100 MG PO TABS: 100.0000 mg | ORAL_TABLET | Freq: Two times a day (BID) | ORAL | 0 refills | Status: DC

## 2017-09-23 NOTE — Telephone Encounter (Signed)
Pt was seen for his AWV today and stated that he received a prescription for Doxycycline for diverticulitis at his last OV on 08/20/17. Pt states he was given a back up prescription to keep on hand with any future flares. However, pt said he only received 7 tablets (instructions are take 1 tablet BID) and his last Rx was for 14 tablets. Pt wanted to know if this was right or if there was an error.  Thanks, MM

## 2017-09-23 NOTE — Progress Notes (Signed)
Subjective:   Xzander R Ewel Lona. is a 72 y.o. male who presents for Medicare Annual/Subsequent preventive examination.  Review of Systems:  N/A Cardiac Risk Factors include: advanced age (>55men, >17 women);diabetes mellitus;male gender;dyslipidemia;hypertension;obesity (BMI >30kg/m2)     Objective:    Vitals: BP 140/68 (BP Location: Right Arm)   Pulse 80   Temp 98 F (36.7 C) (Oral)   Ht 6\' 1"  (1.854 m)   Wt 236 lb 6.4 oz (107.2 kg)   BMI 31.19 kg/m   Body mass index is 31.19 kg/m.  Advanced Directives 09/23/2017 08/22/2016 07/24/2016 09/18/2015 09/06/2015 08/10/2015 03/23/2015  Does Patient Have a Medical Advance Directive? Yes No Yes Yes Yes Yes Yes  Type of Advance Directive Living will - Living will Living will - [place];Living will Living will  Does patient want to make changes to medical advance directive? - - - No - Patient declined - - -  Copy of [place] in [place] History   Tobacco Use  Smoking Status Former Smoker  . Packs/day: 2.00  . Years: 15.00  . Pack years: 30.00  . Types: Cigarettes  . Last attempt to quit: 06/25/1974  . Years since quitting: 43.2  Smokeless Tobacco Never Used     Counseling given: Not Answered   Clinical Intake:  Pre-visit preparation completed: Yes  Pain : 0-10 Pain Score: 4  Pain Location: Shoulder(and back) Pain Orientation: Right, Upper Pain Descriptors / Indicators: Aching, Dull Pain Frequency: Constant     Nutritional Status: BMI > 30  Obese Nutritional Risks: None Diabetes: Yes(type 2) CBG done?: No Did pt. bring in CBG monitor from home?: No  How often do you need to have someone help you when you read instructions, pamphlets, or other written materials from your doctor or pharmacy?: 1 - Never  Interpreter Needed?: No  Information entered by :: [facility], LPN  Past Medical History:  Diagnosis Date  . Back pain    lower  . Diabetes  mellitus without complication [facility])    Patient takes Metformin.  . Elevated lipids   . GERD (gastroesophageal reflux disease)   . Hemorrhoids 2017  . Hypertension   . Kidney stone 2017   Past Surgical History:  Procedure Laterality Date  . CIRCUMCISION    . COLONOSCOPY  2007  . COLONOSCOPY WITH PROPOFOL N/A 08/22/2016   Procedure: COLONOSCOPY WITH PROPOFOL;  Surgeon: Christene Lye, MD;  Location: [facility] ENDOSCOPY;  Service: Endoscopy;  Laterality: N/A;  . CYSTOSCOPY WITH STENT PLACEMENT Right 09/18/2015   Procedure: CYSTOSCOPY WITH STENT PLACEMENT;  Surgeon: Hollice Espy, MD;  Location: [facility] ORS;  Service: Urology;  Laterality: Right;  . CYSTOSCOPY WITH URETEROSCOPY, STONE BASKETRY AND STENT PLACEMENT Right 10/03/2015   Procedure: CYSTOSCOPY WITH URETEROSCOPY, STONE BASKETRY AND STENT PLACEMENT;  Surgeon: Hollice Espy, MD;  Location: [facility] ORS;  Service: Urology;  Laterality: Right;  . EXTRACORPOREAL SHOCK WAVE LITHOTRIPSY Right 09/15/2015   Procedure: EXTRACORPOREAL SHOCK WAVE LITHOTRIPSY (ESWL);  Surgeon: Nickie Retort, MD;  Location: [facility] ORS;  Service: Urology;  Laterality: Right;  . HERNIA REPAIR    . ROTATOR CUFF REPAIR Right 06/2012   Family History  Problem Relation Age of Onset  . Hypertension Mother   . Diabetes Father   . CAD Father   . Diabetes Sister   . Heart disease Sister   . CAD Sister    Social History   Socioeconomic  History  . Marital status: Married    Spouse name: Not on file  . Number of children: 2  . Years of education: Not on file  . Highest education level: Associate degree: occupational, Hotel manager, or vocational program  Occupational History  . 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 Financial resource strain: Not hard at all  . Food insecurity:    Worry: Never true    Inability: Never true  . Transportation needs:    Medical: No    Non-medical: No  Tobacco Use  . Smoking  status: Former Smoker    Packs/day: 2.00    Years: 15.00    Pack years: 30.00    Types: Cigarettes    Last attempt to quit: 06/25/1974    Years since quitting: 43.2  . Smokeless tobacco: Never Used  Substance and Sexual Activity  . Alcohol use: No  . Drug use: No  . Sexual activity: Not Currently  Lifestyle  . Physical activity:    Days per week: Not on file    Minutes per session: Not on file  . Stress: Not at all  Relationships  . Social connections:    Talks on phone: Not on file    Gets together: Not on file    Attends religious service: Not on file    Active member of club or organization: Not on file    Attends meetings of clubs or organizations: Not on file    Relationship status: Not on file  Other Topics Concern  . Not on file  Social History Narrative  . Not on file    Outpatient Encounter Medications as of 09/23/2017  Medication Sig  . aspirin 81 MG tablet Take 81 mg by mouth daily.   . budesonide (RHINOCORT ALLERGY) 32 MCG/ACT nasal spray Place into both nostrils daily.  . Cholecalciferol (VITAMIN D3) 2000 UNITS capsule Take 2,000 Units by mouth daily.  . diphenhydrAMINE (BENADRYL ALLERGY) 25 mg capsule Take by mouth.   [name] glucose blood test strip 1 each by Other route 2 (two) times daily as needed for other. Use as instructed  . hydrochlorothiazide (HYDRODIURIL) 25 MG tablet TAKE 1 TABLET (25 MG TOTAL) BY MOUTH DAILY.  . meloxicam (MOBIC) 7.5 MG tablet Take 7.5 mg by mouth 2 (two) times daily.  . metFORMIN (GLUCOPHAGE) 1000 MG tablet TAKE 1 TABLET (1,000 MG TOTAL) BY MOUTH 2 (TWO) TIMES DAILY WITH A MEAL.  . MULTIPLE VITAMIN PO Take by mouth daily.   [name] omeprazole (PRILOSEC) 20 MG capsule Take 1 capsule (20 mg total) by mouth daily.  . rosuvastatin (CRESTOR) 20 MG tablet TAKE 1 TABLET (20 MG TOTAL) BY MOUTH DAILY.  . Saw Palmetto, Serenoa repens, (SAW PALMETTO BERRY) 160 MG CAPS Take 2 tablets by mouth daily.   . sildenafil (REVATIO) 20 MG tablet TAKE 1 TABLET BY  MOUTH 3 TIMES DAILY (Patient taking differently: TAKE 1 TABLET BY MOUTH 3 TIMES DAILY as needed)  . telmisartan (MICARDIS) 40 MG tablet Take 1 tablet by mouth daily.  [name] tiZANidine (ZANAFLEX) 4 MG tablet Take 1 tablet (4 mg total) by mouth at bedtime. Reported on 09/19/2015  . traMADol (ULTRAM) 50 MG tablet One pill 3 x day as needed for back or leg pain  . doxycycline (VIBRA-TABS) 100 MG tablet Take 1 tablet (100 mg total) by mouth 2 (two) times daily. (Patient not taking: Reported on 09/23/2017)  . Sodium Selenite POWD Take  by mouth.   No facility-administered encounter medications on file as of 09/23/2017.     Activities of Daily Living In your present state of health, do you have any difficulty performing the following activities: 09/23/2017 08/13/2017  Hearing? N N  Vision? N N  Difficulty concentrating or making decisions? N N  Walking or climbing stairs? N N  Dressing or bathing? N N  Doing errands, shopping? N N  Preparing Food and eating ? N -  Using the Toilet? N -  In the past six months, have you accidently leaked urine? N -  Do you have problems with loss of bowel control? N -  Managing your Medications? N -  Managing your Finances? N -  Housekeeping or managing your Housekeeping? N -  Some recent data might be hidden    Patient Care Team: [name], MD as PCP - General (Family Medicine) Thelma Comp, [place] as Consulting Physician (Optometry) Corey Skains, MD as Consulting Physician (Cardiology) Ralene Bathe, MD as Consulting Physician (Dermatology) Hollice Espy, MD as Consulting Physician (Urology) Barbette Merino, NP as Nurse Practitioner (Nurse Practitioner) Sharlet Salina, MD as Referring Physician (Physical Medicine and Rehabilitation)   Assessment:   This is a routine wellness examination for Kathan.  Exercise Activities and Dietary recommendations Current Exercise Habits: The patient does not participate in regular exercise at  present, Exercise limited by: orthopedic condition(s)  Goals    . Exercise 3x per week (30 min per time)     Pt to start back exercising 3 times a week for at least 30 minutes at a time.        Fall Risk Fall Risk  09/23/2017 08/13/2017 07/24/2016 02/07/2015  Falls in the past year? No No No No   Is the patient's home free of loose throw rugs in walkways, pet beds, electrical cords, etc?   yes      Grab bars in the bathroom? yes      Handrails on the stairs?   yes      Adequate lighting?   yes  Timed Get Up and Go Performed: N/A  Depression Screen PHQ 2/9 Scores 09/23/2017 08/13/2017 07/24/2016 02/07/2015  PHQ - 2 Score 0 0 0 0    Cognitive Function: Pt declined screening today.      6CIT Screen 07/24/2016  What Year? 0 points  What month? 0 points  What time? 0 points  Count back from 20 0 points  Months in reverse 0 points  Repeat phrase 2 points  Total Score 2    Immunization History  Administered Date(s) Administered  . Hepatitis A 08/13/2006  . Influenza, High Dose Seasonal PF 03/23/2015, 03/21/2016, 03/21/2017  . Pneumococcal Conjugate-13 05/25/2014  . Pneumococcal Polysaccharide-23 04/08/2012  . Tdap 10/26/2005  . Zoster 08/13/2006, 04/17/2013    Qualifies for Shingles Vaccine? Due for Shingles vaccine. Declined my offer to administer today. Education has been provided regarding the importance of this vaccine. Pt has been advised to call her insurance company to determine her out of pocket expense. Advised she may also receive this vaccine at her local pharmacy or Health Dept. Verbalized acceptance and understanding.  Screening Tests Health Maintenance  Topic Date Due  . TETANUS/TDAP  10/27/2015  . FOOT EXAM  07/24/2017  . HEMOGLOBIN A1C  12/04/2017  . INFLUENZA VACCINE  01/23/2018  . OPHTHALMOLOGY EXAM  01/24/2018  . COLONOSCOPY  08/22/2026  . Hepatitis C Screening  Completed  . PNA vac Low Risk Adult  Completed   Cancer Screenings: Lung: Low Dose CT Chest  recommended if Age 45-80 years, 30 pack-year currently smoking OR have quit w/in 15years. Patient does not qualify. Colorectal: Up to date  Additional Screenings:  Hepatitis C Screening: Up to date    Plan:  I have personally reviewed and addressed the Medicare Annual Wellness questionnaire and have noted the following in the patient's chart:  A. Medical and social history B. Use of alcohol, tobacco or illicit drugs  C. Current medications and supplements D. Functional ability and status E.  Nutritional status F.  Physical activity G. Advance directives H. List of other physicians I.  Hospitalizations, surgeries, and ER visits in previous 12 months J.  [place] such as hearing and vision if needed, cognitive and depression L. Referrals and appointments - none  In addition, I have reviewed and discussed with patient certain preventive protocols, quality metrics, and best practice recommendations. A written personalized care plan for preventive services as well as general preventive health recommendations were provided to patient.  See attached scanned questionnaire for additional information.   Signed,  Fabio Neighbors, LPN Nurse Health Advisor   Nurse Recommendations: Pt declined tetanus vaccine today. Pt needs a diabetic foot exam at next OV.

## 2017-09-23 NOTE — Telephone Encounter (Signed)
Refill for BID if not resolved.

## 2017-09-23 NOTE — Patient Instructions (Signed)
Manuel Stafford , Thank you for taking time to come for your Medicare Wellness Visit. I appreciate your ongoing commitment to your health goals. Please review the following plan we discussed and let me know if I can assist you in the future.   Screening recommendations/referrals: Colonoscopy: Up to date Recommended yearly ophthalmology/optometry visit for glaucoma screening and checkup Recommended yearly dental visit for hygiene and checkup  Vaccinations: Influenza vaccine: Up to date Pneumococcal vaccine: Up to date Tdap vaccine: Pt declines today.  Shingles vaccine: Pt declines today.     Advanced directives: Please bring a copy of your POA (Power of Attorney) and/or Living Will to your next appointment.   Conditions/risks identified: Obesity- pt to start back exercising 3 times a week for at least 30 minutes at a time.   Next appointment: 10/08/17 @ 10:00 AM with Dr Rosanna Randy.   Preventive Care 6 Years and Older, Male Preventive care refers to lifestyle choices and visits with your health care provider that can promote health and wellness. What does preventive care include?  A yearly physical exam. This is also called an annual well check.  Dental exams once or twice a year.  Routine eye exams. Ask your health care provider how often you should have your eyes checked.  Personal lifestyle choices, including:  Daily care of your teeth and gums.  Regular physical activity.  Eating a healthy diet.  Avoiding tobacco and drug use.  Limiting alcohol use.  Practicing safe sex.  Taking low doses of aspirin every day.  Taking vitamin and mineral supplements as recommended by your health care provider. What happens during an annual well check? The services and screenings done by your health care provider during your annual well check will depend on your age, overall health, lifestyle risk factors, and family history of disease. Counseling  Your health care provider may ask you  questions about your:  Alcohol use.  Tobacco use.  Drug use.  Emotional well-being.  Home and relationship well-being.  Sexual activity.  Eating habits.  History of falls.  Memory and ability to understand (cognition).  Work and work Statistician. Screening  You may have the following tests or measurements:  Height, weight, and BMI.  Blood pressure.  Lipid and cholesterol levels. These may be checked every 5 years, or more frequently if you are over 31 years old.  Skin check.  Lung cancer screening. You may have this screening every year starting at age 71 if you have a 30-pack-year history of smoking and currently smoke or have quit within the past 15 years.  Fecal occult blood test (FOBT) of the stool. You may have this test every year starting at age 70.  Flexible sigmoidoscopy or colonoscopy. You may have a sigmoidoscopy every 5 years or a colonoscopy every 10 years starting at age 26.  Prostate cancer screening. Recommendations will vary depending on your family history and other risks.  Hepatitis C blood test.  Hepatitis B blood test.  Sexually transmitted disease (STD) testing.  Diabetes screening. This is done by checking your blood sugar (glucose) after you have not eaten for a while (fasting). You may have this done every 1-3 years.  Abdominal aortic aneurysm (AAA) screening. You may need this if you are a current or former smoker.  Osteoporosis. You may be screened starting at age 87 if you are at high risk. Talk with your health care provider about your test results, treatment options, and if necessary, the need for more tests. Vaccines  Your health care provider may recommend certain vaccines, such as:  Influenza vaccine. This is recommended every year.  Tetanus, diphtheria, and acellular pertussis (Tdap, Td) vaccine. You may need a Td booster every 10 years.  Zoster vaccine. You may need this after age 78.  Pneumococcal 13-valent conjugate  (PCV13) vaccine. One dose is recommended after age 38.  Pneumococcal polysaccharide (PPSV23) vaccine. One dose is recommended after age 75. Talk to your health care provider about which screenings and vaccines you need and how often you need them. This information is not intended to replace advice given to you by your health care provider. Make sure you discuss any questions you have with your health care provider. Document Released: 07/08/2015 Document Revised: 02/29/2016 Document Reviewed: 04/12/2015 Elsevier Interactive Patient Education  2017 [place] Prevention in the Home Falls can cause injuries. They can happen to people of all ages. There are many things you can do to make your home safe and to help prevent falls. What can I do on the outside of my home?  Regularly fix the edges of walkways and driveways and fix any cracks.  Remove anything that might make you trip as you walk through a door, such as a raised step or threshold.  Trim any bushes or trees on the path to your home.  Use bright outdoor lighting.  Clear any walking paths of anything that might make someone trip, such as rocks or tools.  Regularly check to see if handrails are loose or broken. Make sure that both sides of any steps have handrails.  Any raised decks and porches should have guardrails on the edges.  Have any leaves, snow, or ice cleared regularly.  Use sand or salt on walking paths during winter.  Clean up any spills in your garage right away. This includes oil or grease spills. What can I do in the bathroom?  Use night lights.  Install grab bars by the toilet and in the tub and shower. Do not use towel bars as grab bars.  Use non-skid mats or decals in the tub or shower.  If you need to sit down in the shower, use a plastic, non-slip stool.  Keep the floor dry. Clean up any water that spills on the floor as soon as it happens.  Remove soap buildup in the tub or shower  regularly.  Attach bath mats securely with double-sided non-slip rug tape.  Do not have throw rugs and other things on the floor that can make you trip. What can I do in the bedroom?  Use night lights.  Make sure that you have a light by your bed that is easy to reach.  Do not use any sheets or blankets that are too big for your bed. They should not hang down onto the floor.  Have a firm chair that has side arms. You can use this for support while you get dressed.  Do not have throw rugs and other things on the floor that can make you trip. What can I do in the kitchen?  Clean up any spills right away.  Avoid walking on wet floors.  Keep items that you use a lot in easy-to-reach places.  If you need to reach something above you, use a strong step stool that has a grab bar.  Keep electrical cords out of the way.  Do not use floor polish or wax that makes floors slippery. If you must use wax, use non-skid floor wax.  Do  not have throw rugs and other things on the floor that can make you trip. What can I do with my stairs?  Do not leave any items on the stairs.  Make sure that there are handrails on both sides of the stairs and use them. Fix handrails that are broken or loose. Make sure that handrails are as long as the stairways.  Check any carpeting to make sure that it is firmly attached to the stairs. Fix any carpet that is loose or worn.  Avoid having throw rugs at the top or bottom of the stairs. If you do have throw rugs, attach them to the floor with carpet tape.  Make sure that you have a light switch at the top of the stairs and the bottom of the stairs. If you do not have them, ask someone to add them for you. What else can I do to help prevent falls?  Wear shoes that:  Do not have high heels.  Have rubber bottoms.  Are comfortable and fit you well.  Are closed at the toe. Do not wear sandals.  If you use a stepladder:  Make sure that it is fully  opened. Do not climb a closed stepladder.  Make sure that both sides of the stepladder are locked into place.  Ask someone to hold it for you, if possible.  Clearly mark and make sure that you can see:  Any grab bars or handrails.  First and last steps.  Where the edge of each step is.  Use tools that help you move around (mobility aids) if they are needed. These include:  Canes.  Walkers.  Scooters.  Crutches.  Turn on the lights when you go into a dark area. Replace any light bulbs as soon as they burn out.  Set up your furniture so you have a clear path. Avoid moving your furniture around.  If any of your floors are uneven, fix them.  If there are any pets around you, be aware of where they are.  Review your medicines with your doctor. Some medicines can make you feel dizzy. This can increase your chance of falling. Ask your doctor what other things that you can do to help prevent falls. This information is not intended to replace advice given to you by your health care provider. Make sure you discuss any questions you have with your health care provider. Document Released: 04/07/2009 Document Revised: 11/17/2015 Document Reviewed: 07/16/2014 Elsevier Interactive Patient Education  2017 [employer].

## 2017-09-23 NOTE — Telephone Encounter (Signed)
Med sent in. Pt advised.

## 2017-09-24 DIAGNOSIS — M5442 Lumbago with sciatica, left side: Secondary | ICD-10-CM | POA: Diagnosis not present

## 2017-09-24 DIAGNOSIS — G8929 Other chronic pain: Secondary | ICD-10-CM | POA: Diagnosis not present

## 2017-10-02 ENCOUNTER — Ambulatory Visit (INDEPENDENT_AMBULATORY_CARE_PROVIDER_SITE_OTHER): Payer: PPO | Admitting: Family Medicine

## 2017-10-02 ENCOUNTER — Other Ambulatory Visit: Payer: Self-pay

## 2017-10-02 VITALS — BP 136/72 | HR 88 | Temp 97.8°F | Resp 16 | Wt 237.0 lb

## 2017-10-02 DIAGNOSIS — R1032 Left lower quadrant pain: Secondary | ICD-10-CM | POA: Diagnosis not present

## 2017-10-02 DIAGNOSIS — K5792 Diverticulitis of intestine, part unspecified, without perforation or abscess without bleeding: Secondary | ICD-10-CM | POA: Diagnosis not present

## 2017-10-02 NOTE — Progress Notes (Signed)
Manuel Stafford Maryan Char.  MRN: [id number] DOB: [birth year]  Subjective:  HPI   The patient is a 72 year old male who presents today for evaluation of recurrent diverticulitis.  He states he was in about 3 weeks ago and was treated with Doxycycline.  He improved after taking the antibiotic but then a week later started back with symptoms.  He had refill on his Doxycycline and started taking it 5 days ago.   He states that when he coughs it feels like he has a knife stabbing him in his left side.  Patient Active Problem List   Diagnosis Date Noted  . Shoulder pain 03/21/2016  . Flu vaccine need 03/21/2016  . Hydronephrosis with urinary obstruction due to ureteral calculus 09/26/2015  . Microscopic hematuria 09/26/2015  . Hypertensive left ventricular hypertrophy 09/13/2015  . Nephrolithiasis 08/29/2015  . Right ureteral stone 08/29/2015  . Abnormal ECG 08/22/2015  . Benign essential HTN 08/22/2015  . Combined fat and carbohydrate induced hyperlipemia 08/22/2015  . Breathlessness on exertion 08/22/2015  . Lung nodules 04/28/2015  . Allergic rhinitis 12/31/2014  . Diabetes ([place]) 12/31/2014  . Failure of erection 12/31/2014  . Fatty liver disease, nonalcoholic 27/25/3664  . Acid reflux 12/31/2014  . BP (high blood pressure) 12/31/2014  . HLD (hyperlipidemia) 12/31/2014  . Lumbosacral spondylosis without myelopathy 12/31/2014  . Adiposity 12/31/2014  . Herpes zona 12/31/2014  . Disorder of shoulder 12/31/2014  . Apnea, sleep 12/31/2014  . Avitaminosis D 12/31/2014  . DDD (degenerative disc disease), lumbar 11/30/2013  . Back ache 11/30/2013    Past Medical History:  Diagnosis Date  . Back pain    lower  . Diabetes mellitus without complication [facility])    Patient takes Metformin.  . Elevated lipids   . GERD (gastroesophageal reflux disease)   . Hemorrhoids 2017  . Hypertension   . Kidney stone 2017    Social History   Socioeconomic History  . Marital status: Married   Spouse name: Not on file  . Number of children: 2  . Years of education: Not on file  . Highest education level: Associate degree: occupational, Hotel manager, or vocational program  Occupational History  . 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 Financial resource strain: Not hard at all  . Food insecurity:    Worry: Never true    Inability: Never true  . Transportation needs:    Medical: No    Non-medical: No  Tobacco Use  . Smoking status: Former Smoker    Packs/day: 2.00    Years: 15.00    Pack years: 30.00    Types: Cigarettes    Last attempt to quit: 06/25/1974    Years since quitting: 43.3  . Smokeless tobacco: Never Used  Substance and Sexual Activity  . Alcohol use: No  . Drug use: No  . Sexual activity: Not Currently  Lifestyle  . Physical activity:    Days per week: Not on file    Minutes per session: Not on file  . Stress: Not at all  Relationships  . Social connections:    Talks on phone: Not on file    Gets together: Not on file    Attends religious service: Not on file    Active member of club or organization: Not on file    Attends meetings of clubs or organizations: Not on file    Relationship status: Not on file  . Intimate  partner violence:    Fear of current or ex partner: Not on file    Emotionally abused: Not on file    Physically abused: Not on file    Forced sexual activity: Not on file  Other Topics Concern  . Not on file  Social History Narrative  . Not on file    Outpatient Encounter Medications as of 10/02/2017  Medication Sig Note  . aspirin 81 MG tablet Take 81 mg by mouth daily.  12/30/2014: Received from: [facility]  . budesonide (RHINOCORT ALLERGY) 32 MCG/ACT nasal spray Place into both nostrils daily.   . Cholecalciferol (VITAMIN D3) 2000 UNITS capsule Take 2,000 Units by mouth daily.   . diphenhydrAMINE (BENADRYL ALLERGY) 25 mg capsule Take by mouth.  12/30/2014:  Received from: [facility]  . doxycycline (VIBRA-TABS) 100 MG tablet Take 1 tablet (100 mg total) by mouth 2 (two) times daily.   [name] glucose blood test strip 1 each by Other route 2 (two) times daily as needed for other. Use as instructed   . hydrochlorothiazide (HYDRODIURIL) 25 MG tablet TAKE 1 TABLET (25 MG TOTAL) BY MOUTH DAILY.   . meloxicam (MOBIC) 7.5 MG tablet Take 7.5 mg by mouth 2 (two) times daily.   . metFORMIN (GLUCOPHAGE) 1000 MG tablet TAKE 1 TABLET (1,000 MG TOTAL) BY MOUTH 2 (TWO) TIMES DAILY WITH A MEAL.   . MULTIPLE VITAMIN PO Take by mouth daily.  12/30/2014: Received from: [facility]  . omeprazole (PRILOSEC) 20 MG capsule Take 1 capsule (20 mg total) by mouth daily.   . rosuvastatin (CRESTOR) 20 MG tablet TAKE 1 TABLET (20 MG TOTAL) BY MOUTH DAILY.   . Saw Palmetto, Serenoa repens, (SAW PALMETTO BERRY) 160 MG CAPS Take 2 tablets by mouth daily.  12/30/2014: Received from: [facility]  . sildenafil (REVATIO) 20 MG tablet TAKE 1 TABLET BY MOUTH 3 TIMES DAILY (Patient taking differently: TAKE 1 TABLET BY MOUTH 3 TIMES DAILY as needed)   . telmisartan (MICARDIS) 40 MG tablet Take 1 tablet by mouth daily. 03/21/2016: Received from: External Pharmacy  . tiZANidine (ZANAFLEX) 4 MG tablet Take 1 tablet (4 mg total) by mouth at bedtime. Reported on 09/19/2015   . traMADol (ULTRAM) 50 MG tablet One pill 3 x day as needed for back or leg pain   . [DISCONTINUED] Sodium Selenite POWD Take by mouth.    No facility-administered encounter medications on file as of 10/02/2017.     Allergies  Allergen Reactions  . Actos [Pioglitazone]     Muscle weakness    Review of Systems  Constitutional: Negative for fever.  Respiratory: Positive for cough (most likely allergy related). Negative for shortness of breath and wheezing.   Cardiovascular: Negative for chest pain, palpitations, orthopnea, claudication and leg swelling.  Gastrointestinal:  Positive for abdominal pain, diarrhea (occasional), heartburn and melena (1 week ago very dark stool.). Negative for blood in stool, constipation, nausea and vomiting.  Endo/Heme/Allergies: Negative.   Psychiatric/Behavioral: Negative.     Objective:  BP 136/72 (BP Location: Right Arm, Patient Position: Sitting, Cuff Size: Normal)   Pulse 88   Temp 97.8 F (36.6 C) (Oral)   Resp 16   Wt 237 lb (107.5 kg)   BMI 31.27 kg/m   Physical Exam  Constitutional: He is oriented to person, place, and time and well-developed, well-nourished, and in no distress.  HENT:  Head: Normocephalic and atraumatic.  Eyes: Conjunctivae are normal. No scleral icterus.  Neck: Neck supple.  No thyromegaly present.  Cardiovascular: Normal rate, regular rhythm and normal heart sounds.  Pulmonary/Chest: Effort normal and breath sounds normal.  Abdominal: Soft. There is tenderness.  LLQ tenderness without rebound.  Lymphadenopathy:    He has no cervical adenopathy.  Neurological: He is alert and oriented to person, place, and time. Gait normal. GCS score is 15.  Skin: Skin is warm and dry.  Psychiatric: Mood, memory, affect and judgment normal.    Assessment and Plan :  1. Left lower quadrant pain/Diverticulitis CT scan due to rapid recurrence. RTC next week.Rx doxycycline. - CBC with Differential/Platelet - Comprehensive metabolic panel - CT Abdomen Pelvis W Contrast; Future  I have done the exam and reviewed the chart and it is accurate to the best of my knowledge. Development worker, community has been used and  any errors in dictation or transcription are unintentional. Miguel Aschoff M.D. [facility]

## 2017-10-03 LAB — CBC WITH DIFFERENTIAL/PLATELET
BASOS: 1 %
Basophils Absolute: 0 10*3/uL (ref 0.0–0.2)
EOS (ABSOLUTE): 0.1 10*3/uL (ref 0.0–0.4)
Eos: 1 %
HEMATOCRIT: 45.6 % (ref 37.5–51.0)
Hemoglobin: 15.3 g/dL (ref 13.0–17.7)
IMMATURE GRANULOCYTES: 0 %
Immature Grans (Abs): 0 10*3/uL (ref 0.0–0.1)
Lymphocytes Absolute: 2.4 10*3/uL (ref 0.7–3.1)
Lymphs: 29 %
MCH: 32.8 pg (ref 26.6–33.0)
MCHC: 33.6 g/dL (ref 31.5–35.7)
MCV: 98 fL — AB (ref 79–97)
MONOS ABS: 0.7 10*3/uL (ref 0.1–0.9)
Monocytes: 8 %
NEUTROS PCT: 61 %
Neutrophils Absolute: 5.1 10*3/uL (ref 1.4–7.0)
PLATELETS: 182 10*3/uL (ref 150–379)
RBC: 4.66 x10E6/uL (ref 4.14–5.80)
RDW: 12.8 % (ref 12.3–15.4)
WBC: 8.3 10*3/uL (ref 3.4–10.8)

## 2017-10-03 LAB — COMPREHENSIVE METABOLIC PANEL
A/G RATIO: 1.7 (ref 1.2–2.2)
ALK PHOS: 116 IU/L (ref 39–117)
ALT: 35 IU/L (ref 0–44)
AST: 44 IU/L — AB (ref 0–40)
Albumin: 4.3 g/dL (ref 3.5–4.8)
BILIRUBIN TOTAL: 0.6 mg/dL (ref 0.0–1.2)
BUN/Creatinine Ratio: 15 (ref 10–24)
BUN: 22 mg/dL (ref 8–27)
CALCIUM: 10.5 mg/dL — AB (ref 8.6–10.2)
CHLORIDE: 101 mmol/L (ref 96–106)
CO2: 20 mmol/L (ref 20–29)
Creatinine, Ser: 1.46 mg/dL — ABNORMAL HIGH (ref 0.76–1.27)
GFR calc Af Amer: 54 mL/min/{1.73_m2} — ABNORMAL LOW (ref >59)
GFR, EST NON AFRICAN AMERICAN: 47 mL/min/{1.73_m2} — AB (ref >59)
GLOBULIN, TOTAL: 2.5 g/dL (ref 1.5–4.5)
Glucose: 228 mg/dL — ABNORMAL HIGH (ref 65–99)
POTASSIUM: 4.6 mmol/L (ref 3.5–5.2)
SODIUM: 137 mmol/L (ref 134–144)
Total Protein: 6.8 g/dL (ref 6.0–8.5)

## 2017-10-04 ENCOUNTER — Other Ambulatory Visit: Payer: Self-pay | Admitting: Family Medicine

## 2017-10-04 DIAGNOSIS — G8929 Other chronic pain: Secondary | ICD-10-CM | POA: Diagnosis not present

## 2017-10-04 DIAGNOSIS — M5442 Lumbago with sciatica, left side: Secondary | ICD-10-CM | POA: Diagnosis not present

## 2017-10-07 ENCOUNTER — Ambulatory Visit: Payer: Self-pay | Admitting: Family Medicine

## 2017-10-08 ENCOUNTER — Other Ambulatory Visit: Payer: Self-pay

## 2017-10-08 ENCOUNTER — Ambulatory Visit (INDEPENDENT_AMBULATORY_CARE_PROVIDER_SITE_OTHER): Payer: PPO | Admitting: Family Medicine

## 2017-10-08 VITALS — BP 132/78 | HR 96 | Temp 97.6°F | Resp 16 | Ht 72.0 in | Wt 235.0 lb

## 2017-10-08 DIAGNOSIS — K760 Fatty (change of) liver, not elsewhere classified: Secondary | ICD-10-CM | POA: Diagnosis not present

## 2017-10-08 DIAGNOSIS — I10 Essential (primary) hypertension: Secondary | ICD-10-CM | POA: Diagnosis not present

## 2017-10-08 DIAGNOSIS — E782 Mixed hyperlipidemia: Secondary | ICD-10-CM | POA: Diagnosis not present

## 2017-10-08 DIAGNOSIS — E119 Type 2 diabetes mellitus without complications: Secondary | ICD-10-CM | POA: Diagnosis not present

## 2017-10-08 DIAGNOSIS — Z0000 Encounter for general adult medical examination without abnormal findings: Secondary | ICD-10-CM

## 2017-10-08 NOTE — Progress Notes (Signed)
Patient: Manuel Sossamon., Male    DOB: April 03, 1946, 72 y.o.   MRN: [id number] Visit Date: 10/08/2017  Today's Provider: [name], MD   Chief Complaint  Patient presents with  . Annual Exam   Subjective:  Manuel R Keary Waterson. is a 72 y.o. male who presents today for health maintenance and complete physical. He feels well. He reports exercising none. He reports he is sleeping poorly.  Immunization History  Administered Date(s) Administered  . Hepatitis A 08/13/2006  . Influenza, High Dose Seasonal PF 03/23/2015, 03/21/2016, 03/21/2017  . Pneumococcal Conjugate-13 05/25/2014  . Pneumococcal Polysaccharide-23 04/08/2012  . Tdap 10/26/2005  . Zoster 08/13/2006, 04/17/2013   08/22/16 Colonoscopy,Sankar-Diverticula  Review of Systems  Constitutional: Negative.   HENT: Positive for congestion and trouble swallowing.   Eyes: Negative.   Respiratory: Positive for cough (chronic and unchanged).   Cardiovascular: Negative.   Gastrointestinal: Abdominal pain: secondary to divertulitis flare, but improving.  Endocrine: Negative.   Genitourinary: Negative.   Musculoskeletal: Positive for arthralgias, back pain and neck pain.  Skin: Negative.   Allergic/Immunologic: Negative.   Neurological: Tremors: right hand only.  Hematological: Bruises/bleeds easily.  Psychiatric/Behavioral: Negative.     Social History   Socioeconomic History  . Marital status: Married    Spouse name: Not on file  . Number of children: 2  . Years of education: Not on file  . Highest education level: Associate degree: occupational, Hotel manager, or vocational program  Occupational History  . 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 Financial resource strain: Not hard at all  . Food insecurity:    Worry: Never true    Inability: Never true  . Transportation needs:    Medical: No    Non-medical: No  Tobacco Use  . Smoking status: Former  Smoker    Packs/day: 2.00    Years: 15.00    Pack years: 30.00    Types: Cigarettes    Last attempt to quit: 06/25/1974    Years since quitting: 43.3  . Smokeless tobacco: Never Used  Substance and Sexual Activity  . Alcohol use: No  . Drug use: No  . Sexual activity: Not Currently  Lifestyle  . Physical activity:    Days per week: Not on file    Minutes per session: Not on file  . Stress: Not at all  Relationships  . Social connections:    Talks on phone: Not on file    Gets together: Not on file    Attends religious service: Not on file    Active member of club or organization: Not on file    Attends meetings of clubs or organizations: Not on file    Relationship status: Not on file  . Intimate partner violence:    Fear of current or ex partner: Not on file    Emotionally abused: Not on file    Physically abused: Not on file    Forced sexual activity: Not on file  Other Topics Concern  . Not on file  Social History Narrative  . Not on file    Patient Active Problem List   Diagnosis Date Noted  . Shoulder pain 03/21/2016  . Flu vaccine need 03/21/2016  . Hydronephrosis with urinary obstruction due to ureteral calculus 09/26/2015  . Microscopic hematuria 09/26/2015  . Hypertensive left ventricular hypertrophy 09/13/2015  . Nephrolithiasis 08/29/2015  . Right ureteral stone 08/29/2015  . Abnormal ECG 08/22/2015  .  Benign essential HTN 08/22/2015  . Combined fat and carbohydrate induced hyperlipemia 08/22/2015  . Breathlessness on exertion 08/22/2015  . Lung nodules 04/28/2015  . Allergic rhinitis 12/31/2014  . Diabetes ([place]) 12/31/2014  . Failure of erection 12/31/2014  . Fatty liver disease, nonalcoholic 36/14/4315  . Acid reflux 12/31/2014  . BP (high blood pressure) 12/31/2014  . HLD (hyperlipidemia) 12/31/2014  . Lumbosacral spondylosis without myelopathy 12/31/2014  . Adiposity 12/31/2014  . Herpes zona 12/31/2014  . Disorder of shoulder 12/31/2014  .  Apnea, sleep 12/31/2014  . Avitaminosis D 12/31/2014  . DDD (degenerative disc disease), lumbar 11/30/2013  . Back ache 11/30/2013    Past Surgical History:  Procedure Laterality Date  . CIRCUMCISION    . COLONOSCOPY  2007  . COLONOSCOPY WITH PROPOFOL N/A 08/22/2016   Procedure: COLONOSCOPY WITH PROPOFOL;  Surgeon: Christene Lye, MD;  Location: [facility] ENDOSCOPY;  Service: Endoscopy;  Laterality: N/A;  . CYSTOSCOPY WITH STENT PLACEMENT Right 09/18/2015   Procedure: CYSTOSCOPY WITH STENT PLACEMENT;  Surgeon: Hollice Espy, MD;  Location: [facility] ORS;  Service: Urology;  Laterality: Right;  . CYSTOSCOPY WITH URETEROSCOPY, STONE BASKETRY AND STENT PLACEMENT Right 10/03/2015   Procedure: CYSTOSCOPY WITH URETEROSCOPY, STONE BASKETRY AND STENT PLACEMENT;  Surgeon: Hollice Espy, MD;  Location: [facility] ORS;  Service: Urology;  Laterality: Right;  . EXTRACORPOREAL SHOCK WAVE LITHOTRIPSY Right 09/15/2015   Procedure: EXTRACORPOREAL SHOCK WAVE LITHOTRIPSY (ESWL);  Surgeon: Nickie Retort, MD;  Location: [facility] ORS;  Service: Urology;  Laterality: Right;  . HERNIA REPAIR    . ROTATOR CUFF REPAIR Right 06/2012    His family history includes CAD in his father and sister; Diabetes in his father and sister; Heart disease in his sister; Hypertension in his mother.     Outpatient Encounter Medications as of 10/08/2017  Medication Sig Note  . aspirin 81 MG tablet Take 81 mg by mouth daily.  12/30/2014: Received from: [facility]  . budesonide (RHINOCORT ALLERGY) 32 MCG/ACT nasal spray Place into both nostrils daily.   . Cholecalciferol (VITAMIN D3) 2000 UNITS capsule Take 2,000 Units by mouth daily.   . diphenhydrAMINE (BENADRYL ALLERGY) 25 mg capsule Take by mouth.  12/30/2014: Received from: [facility]  . doxycycline (VIBRA-TABS) 100 MG tablet Take 1 tablet (100 mg total) by mouth 2 (two) times daily.   [name] glucose blood test strip 1 each by Other route 2 (two) times daily  as needed for other. Use as instructed   . hydrochlorothiazide (HYDRODIURIL) 25 MG tablet TAKE 1 TABLET (25 MG TOTAL) BY MOUTH DAILY.   . meloxicam (MOBIC) 7.5 MG tablet Take 7.5 mg by mouth 2 (two) times daily.   . metFORMIN (GLUCOPHAGE) 1000 MG tablet TAKE 1 TABLET (1,000 MG TOTAL) BY MOUTH 2 (TWO) TIMES DAILY WITH A MEAL.   . MULTIPLE VITAMIN PO Take by mouth daily.  12/30/2014: Received from: [facility]  . omeprazole (PRILOSEC) 20 MG capsule Take 1 capsule (20 mg total) by mouth daily.   . rosuvastatin (CRESTOR) 20 MG tablet TAKE 1 TABLET (20 MG TOTAL) BY MOUTH DAILY.   . Saw Palmetto, Serenoa repens, (SAW PALMETTO BERRY) 160 MG CAPS Take 2 tablets by mouth daily.  12/30/2014: Received from: [facility]  . sildenafil (REVATIO) 20 MG tablet TAKE 1 TABLET BY MOUTH 3 TIMES DAILY (Patient taking differently: TAKE 1 TABLET BY MOUTH 3 TIMES DAILY as needed)   . telmisartan (MICARDIS) 40 MG tablet Take 1 tablet by mouth daily. 03/21/2016:  Received from: External Pharmacy  . tiZANidine (ZANAFLEX) 4 MG tablet Take 1 tablet (4 mg total) by mouth at bedtime. Reported on 09/19/2015   . traMADol (ULTRAM) 50 MG tablet One pill 3 x day as needed for back or leg pain    No facility-administered encounter medications on file as of 10/08/2017.     Patient Care Team: [name], MD as PCP - General (Family Medicine) Thelma Comp, [place] as Consulting Physician (Optometry) Corey Skains, MD as Consulting Physician (Cardiology) Ralene Bathe, MD as Consulting Physician (Dermatology) Hollice Espy, MD as Consulting Physician (Urology) Barbette Merino, NP as Nurse Practitioner (Nurse Practitioner) Sharlet Salina, MD as Referring Physician (Physical Medicine and Rehabilitation)      Objective:   Vitals:  Vitals:   10/08/17 1034  BP: 132/78  Pulse: 96  Resp: 16  Temp: 97.6 F (36.4 C)  TempSrc: Oral  Weight: 235 lb (106.6 kg)  Height: 6'  (1.829 m)    Physical Exam  Constitutional: He is oriented to person, place, and time. He appears well-developed and well-nourished.  HENT:  Head: Normocephalic and atraumatic.  Right Ear: External ear normal.  Left Ear: External ear normal.  Nose: Nose normal.  Mouth/Throat: Oropharynx is clear and moist.  Eyes: Pupils are equal, round, and reactive to light. Conjunctivae and EOM are normal.  Neck: Normal range of motion. Neck supple.  Cardiovascular: Normal rate, regular rhythm, normal heart sounds and intact distal pulses.  Pulmonary/Chest: Effort normal and breath sounds normal.  Abdominal: Soft. Bowel sounds are normal.  Genitourinary: Rectum normal, prostate normal and penis normal.  Musculoskeletal: Normal range of motion.  Neurological: He is alert and oriented to person, place, and time.  Skin: Skin is warm and dry.  Psychiatric: He has a normal mood and affect. His behavior is normal. Judgment and thought content normal.     Depression Screen PHQ 2/9 Scores 09/23/2017 08/13/2017 07/24/2016 02/07/2015  PHQ - 2 Score 0 0 0 0      Assessment & Plan:     Routine Health Maintenance and Physical Exam  Exercise Activities and Dietary recommendations Goals    . Exercise 3x per week (30 min per time)     Pt to start back exercising 3 times a week for at least 30 minutes at a time.        Immunization History  Administered Date(s) Administered  . Hepatitis A 08/13/2006  . Influenza, High Dose Seasonal PF 03/23/2015, 03/21/2016, 03/21/2017  . Pneumococcal Conjugate-13 05/25/2014  . Pneumococcal Polysaccharide-23 04/08/2012  . Tdap 10/26/2005  . Zoster 08/13/2006, 04/17/2013    Health Maintenance  Topic Date Due  . TETANUS/TDAP  10/27/2015  . FOOT EXAM  07/24/2017  . HEMOGLOBIN A1C  12/04/2017  . INFLUENZA VACCINE  01/23/2018  . OPHTHALMOLOGY EXAM  01/24/2018  . COLONOSCOPY  08/22/2026  . Hepatitis C Screening  Completed  . PNA vac Low Risk Adult  Completed      Discussed health benefits of physical activity, and encouraged him to engage in regular exercise appropriate for his age and condition.    I have done the exam and reviewed the chart and it is accurate to the best of my knowledge. Development worker, community has been used and  any errors in dictation or transcription are unintentional. Miguel Aschoff M.D. [facility]

## 2017-10-09 DIAGNOSIS — G8929 Other chronic pain: Secondary | ICD-10-CM | POA: Diagnosis not present

## 2017-10-09 DIAGNOSIS — M5442 Lumbago with sciatica, left side: Secondary | ICD-10-CM | POA: Diagnosis not present

## 2017-10-11 ENCOUNTER — Ambulatory Visit
Admission: RE | Admit: 2017-10-11 | Discharge: 2017-10-11 | Disposition: A | Discharge status: Home / Self Care | Payer: PPO | Source: Ambulatory Visit | Attending: Family Medicine | Admitting: Family Medicine

## 2017-10-11 DIAGNOSIS — K5730 Diverticulosis of large intestine without perforation or abscess without bleeding: Secondary | ICD-10-CM | POA: Diagnosis not present

## 2017-10-11 DIAGNOSIS — K4090 Unilateral inguinal hernia, without obstruction or gangrene, not specified as recurrent: Secondary | ICD-10-CM | POA: Diagnosis not present

## 2017-10-11 DIAGNOSIS — K429 Umbilical hernia without obstruction or gangrene: Secondary | ICD-10-CM | POA: Diagnosis not present

## 2017-10-11 DIAGNOSIS — R1032 Left lower quadrant pain: Secondary | ICD-10-CM | POA: Diagnosis not present

## 2017-10-11 DIAGNOSIS — R932 Abnormal findings on diagnostic imaging of liver and biliary tract: Secondary | ICD-10-CM | POA: Insufficient documentation

## 2017-10-11 DIAGNOSIS — R109 Unspecified abdominal pain: Secondary | ICD-10-CM | POA: Diagnosis not present

## 2017-10-11 MED ORDER — IOPAMIDOL (ISOVUE-300) INJECTION 61%
85.0000 mL | Freq: Once | INTRAVENOUS | Status: AC | PRN
  Administered 2017-10-11: 85 mL via INTRAVENOUS

## 2017-10-14 DIAGNOSIS — E782 Mixed hyperlipidemia: Secondary | ICD-10-CM | POA: Diagnosis not present

## 2017-10-14 DIAGNOSIS — K760 Fatty (change of) liver, not elsewhere classified: Secondary | ICD-10-CM | POA: Diagnosis not present

## 2017-10-14 DIAGNOSIS — E119 Type 2 diabetes mellitus without complications: Secondary | ICD-10-CM | POA: Diagnosis not present

## 2017-10-14 DIAGNOSIS — I10 Essential (primary) hypertension: Secondary | ICD-10-CM | POA: Diagnosis not present

## 2017-10-15 ENCOUNTER — Ambulatory Visit (INDEPENDENT_AMBULATORY_CARE_PROVIDER_SITE_OTHER): Payer: PPO | Admitting: Family Medicine

## 2017-10-15 ENCOUNTER — Other Ambulatory Visit: Payer: Self-pay

## 2017-10-15 VITALS — BP 146/72 | HR 90 | Temp 97.8°F | Resp 16 | Wt 241.0 lb

## 2017-10-15 DIAGNOSIS — K5792 Diverticulitis of intestine, part unspecified, without perforation or abscess without bleeding: Secondary | ICD-10-CM | POA: Diagnosis not present

## 2017-10-15 LAB — COMPREHENSIVE METABOLIC PANEL
A/G RATIO: 1.7 (ref 1.2–2.2)
ALT: 42 IU/L (ref 0–44)
AST: 45 IU/L — AB (ref 0–40)
Albumin: 4.3 g/dL (ref 3.5–4.8)
Alkaline Phosphatase: 105 IU/L (ref 39–117)
BUN/Creatinine Ratio: 20 (ref 10–24)
BUN: 21 mg/dL (ref 8–27)
Bilirubin Total: 0.5 mg/dL (ref 0.0–1.2)
CHLORIDE: 102 mmol/L (ref 96–106)
CO2: 24 mmol/L (ref 20–29)
Calcium: 9.7 mg/dL (ref 8.6–10.2)
Creatinine, Ser: 1.07 mg/dL (ref 0.76–1.27)
GFR calc non Af Amer: 68 mL/min/{1.73_m2} (ref >59)
GFR, EST AFRICAN AMERICAN: 79 mL/min/{1.73_m2} (ref >59)
GLUCOSE: 196 mg/dL — AB (ref 65–99)
Globulin, Total: 2.5 g/dL (ref 1.5–4.5)
POTASSIUM: 4.5 mmol/L (ref 3.5–5.2)
Sodium: 138 mmol/L (ref 134–144)
TOTAL PROTEIN: 6.8 g/dL (ref 6.0–8.5)

## 2017-10-15 LAB — LIPID PANEL WITH LDL/HDL RATIO
Cholesterol, Total: 112 mg/dL (ref 100–199)
HDL: 32 mg/dL — AB (ref >39)
LDL Calculated: 47 mg/dL (ref 0–99)
LDl/HDL Ratio: 1.5 ratio (ref 0.0–3.6)
Triglycerides: 167 mg/dL — ABNORMAL HIGH (ref 0–149)
VLDL Cholesterol Cal: 33 mg/dL (ref 5–40)

## 2017-10-15 LAB — TSH: TSH: 1.29 u[IU]/mL (ref 0.450–4.500)

## 2017-10-15 LAB — HEMOGLOBIN A1C
Est. average glucose Bld gHb Est-mCnc: 200 mg/dL
Hgb A1c MFr Bld: 8.6 % — ABNORMAL HIGH (ref 4.8–5.6)

## 2017-10-15 MED ORDER — CIPROFLOXACIN HCL 500 MG PO TABS: 500.0000 mg | ORAL_TABLET | Freq: Two times a day (BID) | ORAL | 0 refills | Status: DC

## 2017-10-15 MED ORDER — METRONIDAZOLE 500 MG PO TABS: 500.0000 mg | ORAL_TABLET | Freq: Two times a day (BID) | ORAL | 0 refills | Status: DC

## 2017-10-15 NOTE — Progress Notes (Signed)
Manuel Stafford.  MRN: [id number] DOB: [birth year]  Subjective:  HPI   The patient is a 72 year old male who presents today for follow up and discussion of his recent abdominal CT.  The results of the CT are as follows:  Sigmoid diverticulosis with question subtle acute diverticulitis changes.  No evidence of perforation or abscess.  Probable cholelithiasis.  Small LEFT inguinal and tiny umbilical hernias containing fat.  The patient reports that his symptoms are still present but improving.    Patient Active Problem List   Diagnosis Date Noted  . Shoulder pain 03/21/2016  . Flu vaccine need 03/21/2016  . Hydronephrosis with urinary obstruction due to ureteral calculus 09/26/2015  . Microscopic hematuria 09/26/2015  . Hypertensive left ventricular hypertrophy 09/13/2015  . Nephrolithiasis 08/29/2015  . Right ureteral stone 08/29/2015  . Abnormal ECG 08/22/2015  . Benign essential HTN 08/22/2015  . Combined fat and carbohydrate induced hyperlipemia 08/22/2015  . Breathlessness on exertion 08/22/2015  . Lung nodules 04/28/2015  . Allergic rhinitis 12/31/2014  . Diabetes ([place]) 12/31/2014  . Failure of erection 12/31/2014  . Fatty liver disease, nonalcoholic 38/45/3646  . Acid reflux 12/31/2014  . BP (high blood pressure) 12/31/2014  . HLD (hyperlipidemia) 12/31/2014  . Lumbosacral spondylosis without myelopathy 12/31/2014  . Adiposity 12/31/2014  . Herpes zona 12/31/2014  . Disorder of shoulder 12/31/2014  . Apnea, sleep 12/31/2014  . Avitaminosis D 12/31/2014  . DDD (degenerative disc disease), lumbar 11/30/2013  . Back ache 11/30/2013    Past Medical History:  Diagnosis Date  . Back pain    lower  . Diabetes mellitus without complication [facility])    Patient takes Metformin.  . Elevated lipids   . GERD (gastroesophageal reflux disease)   . Hemorrhoids 2017  . Hypertension   . Kidney stone 2017    Social History   Socioeconomic History  . Marital  status: Married    Spouse name: Not on file  . Number of children: 2  . Years of education: Not on file  . Highest education level: Associate degree: occupational, Hotel manager, or vocational program  Occupational History  . 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 Financial resource strain: Not hard at all  . Food insecurity:    Worry: Never true    Inability: Never true  . Transportation needs:    Medical: No    Non-medical: No  Tobacco Use  . Smoking status: Former Smoker    Packs/day: 2.00    Years: 15.00    Pack years: 30.00    Types: Cigarettes    Last attempt to quit: 06/25/1974    Years since quitting: 43.3  . Smokeless tobacco: Never Used  Substance and Sexual Activity  . Alcohol use: No  . Drug use: No  . Sexual activity: Not Currently  Lifestyle  . Physical activity:    Days per week: Not on file    Minutes per session: Not on file  . Stress: Not at all  Relationships  . Social connections:    Talks on phone: Not on file    Gets together: Not on file    Attends religious service: Not on file    Active member of club or organization: Not on file    Attends meetings of clubs or organizations: Not on file    Relationship status: Not on file  . Intimate partner violence:    Fear of  current or ex partner: Not on file    Emotionally abused: Not on file    Physically abused: Not on file    Forced sexual activity: Not on file  Other Topics Concern  . Not on file  Social History Narrative  . Not on file    Outpatient Encounter Medications as of 10/15/2017  Medication Sig Note  . aspirin 81 MG tablet Take 81 mg by mouth daily.  12/30/2014: Received from: [facility]  . budesonide (RHINOCORT ALLERGY) 32 MCG/ACT nasal spray Place into both nostrils daily.   . Cholecalciferol (VITAMIN D3) 2000 UNITS capsule Take 2,000 Units by mouth daily.   . diphenhydrAMINE (BENADRYL ALLERGY) 25 mg capsule Take  by mouth.  12/30/2014: Received from: [facility]  . doxycycline (VIBRA-TABS) 100 MG tablet Take 1 tablet (100 mg total) by mouth 2 (two) times daily.   [name] glucose blood test strip 1 each by Other route 2 (two) times daily as needed for other. Use as instructed   . hydrochlorothiazide (HYDRODIURIL) 25 MG tablet TAKE 1 TABLET (25 MG TOTAL) BY MOUTH DAILY.   . meloxicam (MOBIC) 7.5 MG tablet Take 7.5 mg by mouth 2 (two) times daily.   . metFORMIN (GLUCOPHAGE) 1000 MG tablet TAKE 1 TABLET (1,000 MG TOTAL) BY MOUTH 2 (TWO) TIMES DAILY WITH A MEAL.   . MULTIPLE VITAMIN PO Take by mouth daily.  12/30/2014: Received from: [facility]  . omeprazole (PRILOSEC) 20 MG capsule Take 1 capsule (20 mg total) by mouth daily.   . rosuvastatin (CRESTOR) 20 MG tablet TAKE 1 TABLET (20 MG TOTAL) BY MOUTH DAILY.   . Saw Palmetto, Serenoa repens, (SAW PALMETTO BERRY) 160 MG CAPS Take 2 tablets by mouth daily.  12/30/2014: Received from: [facility]  . sildenafil (REVATIO) 20 MG tablet TAKE 1 TABLET BY MOUTH 3 TIMES DAILY (Patient taking differently: TAKE 1 TABLET BY MOUTH 3 TIMES DAILY as needed)   . telmisartan (MICARDIS) 40 MG tablet Take 1 tablet by mouth daily. 03/21/2016: Received from: External Pharmacy  . tiZANidine (ZANAFLEX) 4 MG tablet Take 1 tablet (4 mg total) by mouth at bedtime. Reported on 09/19/2015   . traMADol (ULTRAM) 50 MG tablet One pill 3 x day as needed for back or leg pain    No facility-administered encounter medications on file as of 10/15/2017.     Allergies  Allergen Reactions  . Actos [Pioglitazone]     Muscle weakness    Review of Systems  Constitutional: Negative for fever and malaise/fatigue.  Eyes: Negative.   Respiratory: Negative for cough, shortness of breath and wheezing.   Cardiovascular: Negative for chest pain, palpitations, claudication and leg swelling.  Gastrointestinal: Positive for abdominal pain (LLQ pain, only when he  coughs). Negative for blood in stool, constipation, diarrhea, heartburn, nausea and vomiting.  Skin: Negative.   Neurological: Negative.   Endo/Heme/Allergies: Negative.   Psychiatric/Behavioral: Negative.     Objective:  BP (!) 146/72 (BP Location: Right Arm, Patient Position: Sitting, Cuff Size: Normal)   Pulse 90   Temp 97.8 F (36.6 C) (Oral)   Resp 16   Wt 241 lb (109.3 kg)   BMI 32.69 kg/m   Physical Exam  Constitutional: He is oriented to person, place, and time and well-developed, well-nourished, and in no distress.  HENT:  Head: Normocephalic and atraumatic.  Eyes: Conjunctivae are normal.  Neck: No thyromegaly present.  Cardiovascular: Normal rate and regular rhythm.  Pulmonary/Chest: Effort normal and breath sounds  normal.  Abdominal: Soft. There is no tenderness.  Lymphadenopathy:    He has no cervical adenopathy.  Neurological: He is alert and oriented to person, place, and time. Gait normal. GCS score is 15.  Skin: Skin is warm and dry.  Psychiatric: Mood, memory, affect and judgment normal.    Assessment and Plan :  1. Diverticulitis Improving but recurrent. - metroNIDAZOLE (FLAGYL) 500 MG tablet; Take 1 tablet (500 mg total) by mouth 2 (two) times daily.  Dispense: 14 tablet; Refill: 0 - ciprofloxacin (CIPRO) 500 MG tablet; Take 1 tablet (500 mg total) by mouth 2 (two) times daily.  Dispense: 14 tablet; Refill: 0  I have done the exam and reviewed the chart and it is accurate to the best of my knowledge. Development worker, community has been used and  any errors in dictation or transcription are unintentional. Miguel Aschoff M.D. [facility]

## 2017-10-16 DIAGNOSIS — G8929 Other chronic pain: Secondary | ICD-10-CM | POA: Diagnosis not present

## 2017-10-16 DIAGNOSIS — M5442 Lumbago with sciatica, left side: Secondary | ICD-10-CM | POA: Diagnosis not present

## 2017-10-23 DIAGNOSIS — G8929 Other chronic pain: Secondary | ICD-10-CM | POA: Diagnosis not present

## 2017-10-23 DIAGNOSIS — M5442 Lumbago with sciatica, left side: Secondary | ICD-10-CM | POA: Diagnosis not present

## 2017-10-25 DIAGNOSIS — G8929 Other chronic pain: Secondary | ICD-10-CM | POA: Diagnosis not present

## 2017-10-25 DIAGNOSIS — M5442 Lumbago with sciatica, left side: Secondary | ICD-10-CM | POA: Diagnosis not present

## 2017-11-01 DIAGNOSIS — M5442 Lumbago with sciatica, left side: Secondary | ICD-10-CM | POA: Diagnosis not present

## 2017-11-01 DIAGNOSIS — G8929 Other chronic pain: Secondary | ICD-10-CM | POA: Diagnosis not present

## 2017-11-07 ENCOUNTER — Ambulatory Visit: Payer: Self-pay | Admitting: Family Medicine

## 2017-11-11 DIAGNOSIS — M5442 Lumbago with sciatica, left side: Secondary | ICD-10-CM | POA: Diagnosis not present

## 2017-11-11 DIAGNOSIS — G8929 Other chronic pain: Secondary | ICD-10-CM | POA: Diagnosis not present

## 2017-11-14 ENCOUNTER — Other Ambulatory Visit: Payer: Self-pay | Admitting: Family Medicine

## 2017-11-21 ENCOUNTER — Ambulatory Visit (INDEPENDENT_AMBULATORY_CARE_PROVIDER_SITE_OTHER): Payer: PPO | Admitting: Family Medicine

## 2017-11-21 VITALS — BP 134/66 | HR 94 | Temp 97.6°F | Resp 16 | Wt 234.0 lb

## 2017-11-21 DIAGNOSIS — E119 Type 2 diabetes mellitus without complications: Secondary | ICD-10-CM

## 2017-11-21 DIAGNOSIS — K5732 Diverticulitis of large intestine without perforation or abscess without bleeding: Secondary | ICD-10-CM | POA: Diagnosis not present

## 2017-11-21 NOTE — Progress Notes (Signed)
Manuel Stafford.  MRN: [id number] DOB: [birth year]  Subjective:  HPI   The patient is a 72 year old male who presents for follow up of his diverticulosis. The patient states that he feels he is back to his normal.He feels well.  Patient Active Problem List   Diagnosis Date Noted  . Shoulder pain 03/21/2016  . Flu vaccine need 03/21/2016  . Hydronephrosis with urinary obstruction due to ureteral calculus 09/26/2015  . Microscopic hematuria 09/26/2015  . Hypertensive left ventricular hypertrophy 09/13/2015  . Nephrolithiasis 08/29/2015  . Right ureteral stone 08/29/2015  . Abnormal ECG 08/22/2015  . Benign essential HTN 08/22/2015  . Combined fat and carbohydrate induced hyperlipemia 08/22/2015  . Breathlessness on exertion 08/22/2015  . Lung nodules 04/28/2015  . Allergic rhinitis 12/31/2014  . Diabetes ([place]) 12/31/2014  . Failure of erection 12/31/2014  . Fatty liver disease, nonalcoholic 40/34/7425  . Acid reflux 12/31/2014  . BP (high blood pressure) 12/31/2014  . HLD (hyperlipidemia) 12/31/2014  . Lumbosacral spondylosis without myelopathy 12/31/2014  . Adiposity 12/31/2014  . Herpes zona 12/31/2014  . Disorder of shoulder 12/31/2014  . Apnea, sleep 12/31/2014  . Avitaminosis D 12/31/2014  . DDD (degenerative disc disease), lumbar 11/30/2013  . Back ache 11/30/2013    Past Medical History:  Diagnosis Date  . Back pain    lower  . Diabetes mellitus without complication [facility])    Patient takes Metformin.  . Elevated lipids   . GERD (gastroesophageal reflux disease)   . Hemorrhoids 2017  . Hypertension   . Kidney stone 2017    Social History   Socioeconomic History  . Marital status: Married    Spouse name: Not on file  . Number of children: 2  . Years of education: Not on file  . Highest education level: Associate degree: occupational, Hotel manager, or vocational program  Occupational History  . 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 Financial resource strain: Not hard at all  . Food insecurity:    Worry: Never true    Inability: Never true  . Transportation needs:    Medical: No    Non-medical: No  Tobacco Use  . Smoking status: Former Smoker    Packs/day: 2.00    Years: 15.00    Pack years: 30.00    Types: Cigarettes    Last attempt to quit: 06/25/1974    Years since quitting: 43.4  . Smokeless tobacco: Never Used  Substance and Sexual Activity  . Alcohol use: No  . Drug use: No  . Sexual activity: Not Currently  Lifestyle  . Physical activity:    Days per week: Not on file    Minutes per session: Not on file  . Stress: Not at all  Relationships  . Social connections:    Talks on phone: Not on file    Gets together: Not on file    Attends religious service: Not on file    Active member of club or organization: Not on file    Attends meetings of clubs or organizations: Not on file    Relationship status: Not on file  . Intimate partner violence:    Fear of current or ex partner: Not on file    Emotionally abused: Not on file    Physically abused: Not on file    Forced sexual activity: Not on file  Other Topics Concern  . Not on file  Social  History Narrative  . Not on file    Outpatient Encounter Medications as of 11/21/2017  Medication Sig Note  . aspirin 81 MG tablet Take 81 mg by mouth daily.  12/30/2014: Received from: [facility]  . Cholecalciferol (VITAMIN D3) 2000 UNITS capsule Take 2,000 Units by mouth daily.   . diphenhydrAMINE (BENADRYL ALLERGY) 25 mg capsule Take by mouth.  12/30/2014: Received from: [facility]  . glucose blood test strip 1 each by Other route 2 (two) times daily as needed for other. Use as instructed   . hydrochlorothiazide (HYDRODIURIL) 25 MG tablet TAKE 1 TABLET (25 MG TOTAL) BY MOUTH DAILY.   . meloxicam (MOBIC) 7.5 MG tablet Take 7.5 mg by mouth 2 (two) times daily.   . metFORMIN  (GLUCOPHAGE) 1000 MG tablet TAKE 1 TABLET (1,000 MG TOTAL) BY MOUTH 2 (TWO) TIMES DAILY WITH A MEAL.   . MULTIPLE VITAMIN PO Take by mouth daily.  12/30/2014: Received from: [facility]  . omeprazole (PRILOSEC) 20 MG capsule Take 1 capsule (20 mg total) by mouth daily.   . rosuvastatin (CRESTOR) 20 MG tablet TAKE 1 TABLET (20 MG TOTAL) BY MOUTH DAILY.   . Saw Palmetto, Serenoa repens, (SAW PALMETTO BERRY) 160 MG CAPS Take 2 tablets by mouth daily.  12/30/2014: Received from: [facility]  . sildenafil (REVATIO) 20 MG tablet TAKE 1 TABLET BY MOUTH 3 TIMES DAILY (Patient taking differently: TAKE 1 TABLET BY MOUTH 3 TIMES DAILY as needed)   . telmisartan (MICARDIS) 40 MG tablet TAKE 1 TABLET BY MOUTH EVERY DAY   . tiZANidine (ZANAFLEX) 4 MG tablet Take 1 tablet (4 mg total) by mouth at bedtime. Reported on 09/19/2015   . traMADol (ULTRAM) 50 MG tablet One pill 3 x day as needed for back or leg pain   . [DISCONTINUED] budesonide (RHINOCORT ALLERGY) 32 MCG/ACT nasal spray Place into both nostrils daily.   . [DISCONTINUED] ciprofloxacin (CIPRO) 500 MG tablet Take 1 tablet (500 mg total) by mouth 2 (two) times daily.   . [DISCONTINUED] doxycycline (VIBRA-TABS) 100 MG tablet Take 1 tablet (100 mg total) by mouth 2 (two) times daily.   . [DISCONTINUED] metroNIDAZOLE (FLAGYL) 500 MG tablet Take 1 tablet (500 mg total) by mouth 2 (two) times daily.    No facility-administered encounter medications on file as of 11/21/2017.     Allergies  Allergen Reactions  . Actos [Pioglitazone]     Muscle weakness    Review of Systems  Constitutional: Negative for fever and malaise/fatigue.  Eyes: Negative.   Respiratory: Negative for cough, shortness of breath and wheezing.   Cardiovascular: Negative for chest pain, palpitations, orthopnea, claudication and leg swelling.  Gastrointestinal: Negative for abdominal pain, blood in stool, constipation, diarrhea, heartburn, melena, nausea  and vomiting.  Genitourinary: Negative.   Skin: Negative.   Endo/Heme/Allergies: Negative.   Psychiatric/Behavioral: Negative.     Objective:  BP 134/66 (BP Location: Right Arm, Patient Position: Sitting, Cuff Size: Normal)   Pulse 94   Temp 97.6 F (36.4 C) (Oral)   Resp 16   Wt 234 lb (106.1 kg)   BMI 31.74 kg/m   Physical Exam  Constitutional: He is oriented to person, place, and time and well-developed, well-nourished, and in no distress.  HENT:  Head: Normocephalic and atraumatic.  Cardiovascular: Normal rate and regular rhythm.  Pulmonary/Chest: Effort normal.  Abdominal: Soft. He exhibits no mass. There is no tenderness. There is no guarding.  Musculoskeletal: He exhibits no edema.  Neurological: He is alert and oriented to person, place, and time. Gait normal. GCS score is 15.  Skin: Skin is warm and dry.  Psychiatric: Memory, affect and judgment normal.    Assessment and Plan :  Diverticulitis Resolved. If this recurs will refer to surgery or GI in addition to treating.  TIIDM Recheck August.  I have done the exam and reviewed the chart and it is accurate to the best of my knowledge. Development worker, community has been used and  any errors in dictation or transcription are unintentional. Miguel Aschoff M.D. [facility]

## 2017-12-01 ENCOUNTER — Other Ambulatory Visit: Payer: Self-pay | Admitting: Family Medicine

## 2017-12-16 ENCOUNTER — Ambulatory Visit (INDEPENDENT_AMBULATORY_CARE_PROVIDER_SITE_OTHER): Payer: PPO | Admitting: Family Medicine

## 2017-12-16 VITALS — BP 142/70 | HR 94 | Temp 98.0°F | Resp 16 | Wt 235.0 lb

## 2017-12-16 DIAGNOSIS — E119 Type 2 diabetes mellitus without complications: Secondary | ICD-10-CM | POA: Diagnosis not present

## 2017-12-16 DIAGNOSIS — I10 Essential (primary) hypertension: Secondary | ICD-10-CM | POA: Diagnosis not present

## 2017-12-16 DIAGNOSIS — R42 Dizziness and giddiness: Secondary | ICD-10-CM

## 2017-12-16 MED ORDER — MECLIZINE HCL 25 MG PO TABS: 50.0000 mg | ORAL_TABLET | Freq: Three times a day (TID) | ORAL | 1 refills | Status: DC | PRN

## 2017-12-16 NOTE — Progress Notes (Signed)
Simon R Maryan Char.  MRN: [id number] DOB: 1946/01/19  Subjective:  HPI   The patient is a 72 year old male who presents for evaluation of vertigo.  He states that he was sitting on Saturday night and without warning he felt dizzy.  He has been dizzy since.  He checked his blood pressure and glucose and they were both his normal readings.  He states he had this back in 2017 and had some old Meclizine from that episode.  He took some of the Meclizine but states it did not help.   While checking his vital signs today his blood pressure elevated when he went from sitting to standing position and after he had been standing for about 10-15 seconds he started to get dizzy. The patient states he thinks it might be sinus related as he is having some sinus pressure around his eyes and nose. He feels 'woozy". Patient Active Problem List   Diagnosis Date Noted  . Shoulder pain 03/21/2016  . Flu vaccine need 03/21/2016  . Hydronephrosis with urinary obstruction due to ureteral calculus 09/26/2015  . Microscopic hematuria 09/26/2015  . Hypertensive left ventricular hypertrophy 09/13/2015  . Nephrolithiasis 08/29/2015  . Right ureteral stone 08/29/2015  . Abnormal ECG 08/22/2015  . Benign essential HTN 08/22/2015  . Combined fat and carbohydrate induced hyperlipemia 08/22/2015  . Breathlessness on exertion 08/22/2015  . Lung nodules 04/28/2015  . Allergic rhinitis 12/31/2014  . Diabetes ([place]) 12/31/2014  . Failure of erection 12/31/2014  . Fatty liver disease, nonalcoholic 05/39/7673  . Acid reflux 12/31/2014  . BP (high blood pressure) 12/31/2014  . HLD (hyperlipidemia) 12/31/2014  . Lumbosacral spondylosis without myelopathy 12/31/2014  . Adiposity 12/31/2014  . Herpes zona 12/31/2014  . Disorder of shoulder 12/31/2014  . Apnea, sleep 12/31/2014  . Avitaminosis D 12/31/2014  . DDD (degenerative disc disease), lumbar 11/30/2013  . Back ache 11/30/2013    Past Medical History:    Diagnosis Date  . Back pain    lower  . Diabetes mellitus without complication [facility])    Patient takes Metformin.  . Elevated lipids   . GERD (gastroesophageal reflux disease)   . Hemorrhoids 2017  . Hypertension   . Kidney stone 2017    Social History   Socioeconomic History  . Marital status: Married    Spouse name: Not on file  . Number of children: 2  . Years of education: Not on file  . Highest education level: Associate degree: occupational, Hotel manager, or vocational program  Occupational History  . 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 Financial resource strain: Not hard at all  . Food insecurity:    Worry: Never true    Inability: Never true  . Transportation needs:    Medical: No    Non-medical: No  Tobacco Use  . Smoking status: Former Smoker    Packs/day: 2.00    Years: 15.00    Pack years: 30.00    Types: Cigarettes    Last attempt to quit: 06/25/1974    Years since quitting: 43.5  . Smokeless tobacco: Never Used  Substance and Sexual Activity  . Alcohol use: No  . Drug use: No  . Sexual activity: Not Currently  Lifestyle  . Physical activity:    Days per week: Not on file    Minutes per session: Not on file  . Stress: Not at all  Relationships  . Social connections:  Talks on phone: Not on file    Gets together: Not on file    Attends religious service: Not on file    Active member of club or organization: Not on file    Attends meetings of clubs or organizations: Not on file    Relationship status: Not on file  . Intimate partner violence:    Fear of current or ex partner: Not on file    Emotionally abused: Not on file    Physically abused: Not on file    Forced sexual activity: Not on file  Other Topics Concern  . Not on file  Social History Narrative  . Not on file    Outpatient Encounter Medications as of 12/16/2017  Medication Sig Note  . aspirin 81 MG tablet Take 81 mg by  mouth daily.  12/30/2014: Received from: [facility]  . Cholecalciferol (VITAMIN D3) 2000 UNITS capsule Take 2,000 Units by mouth daily.   . diphenhydrAMINE (BENADRYL ALLERGY) 25 mg capsule Take by mouth.  12/30/2014: Received from: [facility]  . glucose blood test strip 1 each by Other route 2 (two) times daily as needed for other. Use as instructed   . hydrochlorothiazide (HYDRODIURIL) 25 MG tablet TAKE 1 TABLET (25 MG TOTAL) BY MOUTH DAILY.   . meloxicam (MOBIC) 7.5 MG tablet Take 7.5 mg by mouth 2 (two) times daily.   . metFORMIN (GLUCOPHAGE) 1000 MG tablet TAKE 1 TABLET (1,000 MG TOTAL) BY MOUTH 2 (TWO) TIMES DAILY WITH A MEAL.   . MULTIPLE VITAMIN PO Take by mouth daily.  12/30/2014: Received from: [facility]  . omeprazole (PRILOSEC) 20 MG capsule TAKE 1 CAPSULE BY MOUTH EVERY DAY   . rosuvastatin (CRESTOR) 20 MG tablet TAKE 1 TABLET (20 MG TOTAL) BY MOUTH DAILY.   . Saw Palmetto, Serenoa repens, (SAW PALMETTO BERRY) 160 MG CAPS Take 2 tablets by mouth daily.  12/30/2014: Received from: [facility]  . sildenafil (REVATIO) 20 MG tablet TAKE 1 TABLET BY MOUTH 3 TIMES DAILY (Patient taking differently: TAKE 1 TABLET BY MOUTH 3 TIMES DAILY as needed)   . telmisartan (MICARDIS) 40 MG tablet TAKE 1 TABLET BY MOUTH EVERY DAY   . tiZANidine (ZANAFLEX) 4 MG tablet Take 1 tablet (4 mg total) by mouth at bedtime. Reported on 09/19/2015   . traMADol (ULTRAM) 50 MG tablet One pill 3 x day as needed for back or leg pain    No facility-administered encounter medications on file as of 12/16/2017.     Allergies  Allergen Reactions  . Actos [Pioglitazone]     Muscle weakness    Review of Systems  Constitutional: Negative for fever and malaise/fatigue.  HENT: Positive for congestion (drainage) and sinus pain (pressure).   Eyes: Negative.   Respiratory: Negative for cough, shortness of breath and wheezing.   Cardiovascular: Negative for  chest pain, palpitations, claudication and leg swelling.  Gastrointestinal: Negative.   Genitourinary: Negative.   Skin: Negative.   Neurological: Positive for dizziness and headaches.  Endo/Heme/Allergies: Negative.   Psychiatric/Behavioral: Negative.     Objective:  BP (!) 142/70 (BP Location: Right Arm, Patient Position: Sitting, Cuff Size: Normal)   Pulse 94   Temp 98 F (36.7 C) (Oral)   Resp 16   Wt 235 lb (106.6 kg)   BMI 31.87 kg/m   Physical Exam  Constitutional: He is oriented to person, place, and time and well-developed, well-nourished, and in no distress.  HENT:  Head: Normocephalic and atraumatic.  Right Ear: External ear normal.  Left Ear: External ear normal.  Nose: Nose normal.  Mouth/Throat: Oropharynx is clear and moist.  Eyes: Conjunctivae are normal. No scleral icterus.  Neck: No thyromegaly present.  Cardiovascular: Normal rate, regular rhythm, normal heart sounds and intact distal pulses.  Pulmonary/Chest: Effort normal and breath sounds normal.  Lymphadenopathy:    He has no cervical adenopathy.  Neurological: He is alert and oriented to person, place, and time. Gait normal.  Mild nystagmus.  Skin: Skin is warm and dry.  Psychiatric: Mood, memory, affect and judgment normal.    Assessment and Plan :  1. Dizzy/Vertigo F/u prn. - EKG 12-Lead - meclizine (ANTIVERT) 25 MG tablet; Take 2 tablets (50 mg total) by mouth 3 (three) times daily as needed for dizziness.  Dispense: 60 tablet; Refill: 1 2.TIIDM 3.HTN  I have done the exam and reviewed the chart and it is accurate to the best of my knowledge. Development worker, community has been used and  any errors in dictation or transcription are unintentional. Miguel Aschoff M.D. [facility]

## 2017-12-24 ENCOUNTER — Other Ambulatory Visit: Payer: Self-pay | Admitting: Family Medicine

## 2017-12-24 DIAGNOSIS — M5136 Other intervertebral disc degeneration, lumbar region: Secondary | ICD-10-CM

## 2018-01-25 ENCOUNTER — Other Ambulatory Visit: Payer: Self-pay | Admitting: Family Medicine

## 2018-01-25 DIAGNOSIS — M5136 Other intervertebral disc degeneration, lumbar region: Secondary | ICD-10-CM

## 2018-02-20 ENCOUNTER — Ambulatory Visit (INDEPENDENT_AMBULATORY_CARE_PROVIDER_SITE_OTHER): Payer: PPO | Admitting: Family Medicine

## 2018-02-20 ENCOUNTER — Encounter: Payer: Self-pay | Admitting: Family Medicine

## 2018-02-20 VITALS — BP 136/68 | HR 77 | Temp 98.2°F | Wt 235.0 lb

## 2018-02-20 DIAGNOSIS — E119 Type 2 diabetes mellitus without complications: Secondary | ICD-10-CM | POA: Diagnosis not present

## 2018-02-20 DIAGNOSIS — I10 Essential (primary) hypertension: Secondary | ICD-10-CM | POA: Diagnosis not present

## 2018-02-20 DIAGNOSIS — E782 Mixed hyperlipidemia: Secondary | ICD-10-CM

## 2018-02-20 LAB — POCT GLYCOSYLATED HEMOGLOBIN (HGB A1C): HEMOGLOBIN A1C: 8 % — AB (ref 4.0–5.6)

## 2018-02-20 NOTE — Progress Notes (Signed)
Patient: Manuel Stafford. Male    DOB: 02-09-1946   72 y.o.   MRN: [id number] Visit Date: 02/20/2018  Today's Provider: [name], MD   Chief Complaint  Patient presents with  . Diabetes  . Hyperlipidemia  . Hypertension   Subjective:    Diabetes  He presents for his follow-up diabetic visit. He has type 2 diabetes mellitus. His disease course has been stable. Hypoglycemia symptoms include dizziness (Occasionally). Pertinent negatives for hypoglycemia include no headaches or sweats. Pertinent negatives for diabetes include no blurred vision, no chest pain, no fatigue, no foot paresthesias, no foot ulcerations, no polydipsia, no polyphagia, no polyuria, no visual change, no weakness and no weight loss. Symptoms are stable. He is compliant with treatment all of the time. His weight is stable. He is following a generally unhealthy diet. He participates in exercise daily. Home blood sugar record trend: Fasting 290-300's. He does not see a podiatrist.Eye exam is current.  Hypertension  This is a chronic problem. The problem is unchanged. The problem is controlled. Pertinent negatives include no anxiety, blurred vision, chest pain, headaches, malaise/fatigue, neck pain, orthopnea, palpitations, peripheral edema, PND, shortness of breath or sweats. There are no associated agents to hypertension. Risk factors for coronary artery disease include diabetes mellitus, dyslipidemia and male gender. There are no compliance problems.   Hyperlipidemia  This is a chronic problem. The problem is controlled. Recent lipid tests were reviewed and are normal. Pertinent negatives include no chest pain or shortness of breath. Current antihyperlipidemic treatment includes statins. The current treatment provides no improvement of lipids. There are no compliance problems.     Lab Results  Component Value Date   CHOL 112 10/14/2017   CHOL 116 10/19/2015   CHOL 161 02/22/2015   Lab Results    Component Value Date   HDL 32 (L) 10/14/2017   HDL 28 (L) 10/19/2015   HDL 32 (L) 02/22/2015   Lab Results  Component Value Date   LDLCALC 47 10/14/2017   LDLCALC 65 10/19/2015   LDLCALC 95 02/22/2015   Lab Results  Component Value Date   TRIG 167 (H) 10/14/2017   TRIG 117 10/19/2015   TRIG 168 (H) 02/22/2015   No results found for: CHOLHDL No results found for: LDLDIRECT Lab Results  Component Value Date   HGBA1C 8.6 (H) 10/14/2017   BP Readings from Last 3 Encounters:  02/20/18 136/68  12/16/17 (!) 142/70  11/21/17 134/66   Wt Readings from Last 3 Encounters:  02/20/18 235 lb (106.6 kg)  12/16/17 235 lb (106.6 kg)  11/21/17 234 lb (106.1 kg)       Allergies  Allergen Reactions  . Actos [Pioglitazone]     Muscle weakness     Current Outpatient Medications:  .  aspirin 81 MG tablet, Take 81 mg by mouth daily. , Disp: , Rfl:  .  Cholecalciferol (VITAMIN D3) 2000 UNITS capsule, Take 2,000 Units by mouth daily., Disp: , Rfl:  .  diphenhydrAMINE (BENADRYL ALLERGY) 25 mg capsule, Take by mouth. , Disp: , Rfl:  .  glucose blood test strip, 1 each by Other route 2 (two) times daily as needed for other. Use as instructed, Disp: 100 each, Rfl: 12 .  hydrochlorothiazide (HYDRODIURIL) 25 MG tablet, TAKE 1 TABLET (25 MG TOTAL) BY MOUTH DAILY., Disp: 90 tablet, Rfl: 3 .  meclizine (ANTIVERT) 25 MG tablet, Take 2 tablets (50 mg total) by mouth 3 (three) times daily as needed  for dizziness., Disp: 60 tablet, Rfl: 1 .  meloxicam (MOBIC) 7.5 MG tablet, Take 7.5 mg by mouth 2 (two) times daily., Disp: , Rfl:  .  metFORMIN (GLUCOPHAGE) 1000 MG tablet, TAKE 1 TABLET (1,000 MG TOTAL) BY MOUTH 2 (TWO) TIMES DAILY WITH A MEAL., Disp: 180 tablet, Rfl: 3 .  MULTIPLE VITAMIN PO, Take by mouth daily. , Disp: , Rfl:  .  omeprazole (PRILOSEC) 20 MG capsule, TAKE 1 CAPSULE BY MOUTH EVERY DAY, Disp: 90 capsule, Rfl: 3 .  rosuvastatin (CRESTOR) 20 MG tablet, TAKE 1 TABLET (20 MG TOTAL) BY  MOUTH DAILY., Disp: 90 tablet, Rfl: 3 .  Saw Palmetto, Serenoa repens, (SAW PALMETTO BERRY) 160 MG CAPS, Take 2 tablets by mouth daily. , Disp: , Rfl:  .  sildenafil (REVATIO) 20 MG tablet, TAKE 1 TABLET BY MOUTH 3 TIMES DAILY (Patient taking differently: TAKE 1 TABLET BY MOUTH 3 TIMES DAILY as needed), Disp: 50 tablet, Rfl: 11 .  tiZANidine (ZANAFLEX) 4 MG tablet, TAKE 1 TABLET (4 MG TOTAL) BY MOUTH AT BEDTIME., Disp: 30 tablet, Rfl: 5 .  traMADol (ULTRAM) 50 MG tablet, TAKE 1 TABLET BY MOUTH 3 TIMES A DAY AS NEEDED FOR BACK OR LEG PAIN, Disp: 180 tablet, Rfl: 1 .  telmisartan (MICARDIS) 40 MG tablet, TAKE 1 TABLET BY MOUTH EVERY DAY, Disp: 90 tablet, Rfl: 3  Review of Systems  Constitutional: Negative.  Negative for fatigue, malaise/fatigue and weight loss.  HENT: Negative.   Eyes: Negative.  Negative for blurred vision.  Respiratory: Negative.  Negative for shortness of breath.   Cardiovascular: Negative for chest pain, palpitations, orthopnea and PND.  Gastrointestinal: Negative.   Endocrine: Negative.  Negative for polydipsia, polyphagia and polyuria.  Musculoskeletal: Negative.  Negative for neck pain.  Allergic/Immunologic: Negative.   Neurological: Positive for dizziness (Occasionally). Negative for weakness, light-headedness and headaches.       Occasionally left greater than right foot feels numb/cold.  Psychiatric/Behavioral: Negative.     Social History   Tobacco Use  . Smoking status: Former Smoker    Packs/day: 2.00    Years: 15.00    Pack years: 30.00    Types: Cigarettes    Last attempt to quit: 06/25/1974    Years since quitting: 43.6  . Smokeless tobacco: Never Used  Substance Use Topics  . Alcohol use: No   Objective:   BP 136/68 (BP Location: Right Arm, Patient Position: Sitting, Cuff Size: Large)   Pulse 77   Temp 98.2 F (36.8 C) (Oral)   Wt 235 lb (106.6 kg)   SpO2 94%   BMI 31.87 kg/m  Vitals:   02/20/18 1340  BP: 136/68  Pulse: 77  Temp: 98.2 F  (36.8 C)  TempSrc: Oral  SpO2: 94%  Weight: 235 lb (106.6 kg)   Results for orders placed or performed in visit on 02/20/18  POCT glycosylated hemoglobin (Hb A1C)  Result Value Ref Range   Hemoglobin A1C 8.0 (A) 4.0 - 5.6 %   HbA1c POC (<> result, manual entry)     HbA1c, POC (prediabetic range)     HbA1c, POC (controlled diabetic range)       Physical Exam      Assessment & Plan:     1. Essential hypertension Stable continue current medications  2. Type 2 diabetes mellitus without complication, unspecified whether long term insulin use (HCC) A1C improved some from 8.6 to 8.0 today.  Pt has an eye exam scheduled for next week.  Order ABI  for 03/07/2018.  Continue current medications, recheck in 3 months.   - POCT glycosylated hemoglobin (Hb A1C)--8.0 today.  3. Combined fat and carbohydrate induced hyperlipemia Stable continue current medications.      I have done the exam and reviewed the above chart and it is accurate to the best of my knowledge. Development worker, community has been used in this note in any air is in the dictation or transcription are unintentional.  [name], MD  [place]

## 2018-02-26 DIAGNOSIS — H5203 Hypermetropia, bilateral: Secondary | ICD-10-CM | POA: Diagnosis not present

## 2018-02-26 DIAGNOSIS — H1852 Epithelial (juvenile) corneal dystrophy: Secondary | ICD-10-CM | POA: Diagnosis not present

## 2018-02-26 DIAGNOSIS — E119 Type 2 diabetes mellitus without complications: Secondary | ICD-10-CM | POA: Diagnosis not present

## 2018-02-26 DIAGNOSIS — H2513 Age-related nuclear cataract, bilateral: Secondary | ICD-10-CM | POA: Diagnosis not present

## 2018-02-26 DIAGNOSIS — H52223 Regular astigmatism, bilateral: Secondary | ICD-10-CM | POA: Diagnosis not present

## 2018-02-26 DIAGNOSIS — H524 Presbyopia: Secondary | ICD-10-CM | POA: Diagnosis not present

## 2018-02-26 LAB — HM DIABETES EYE EXAM

## 2018-03-07 ENCOUNTER — Encounter: Payer: Self-pay | Admitting: Family Medicine

## 2018-03-07 ENCOUNTER — Ambulatory Visit: Payer: PPO

## 2018-04-09 ENCOUNTER — Ambulatory Visit (INDEPENDENT_AMBULATORY_CARE_PROVIDER_SITE_OTHER): Payer: PPO | Admitting: Family Medicine

## 2018-04-09 ENCOUNTER — Ambulatory Visit: Payer: PPO

## 2018-04-09 DIAGNOSIS — Z23 Encounter for immunization: Secondary | ICD-10-CM | POA: Diagnosis not present

## 2018-04-16 ENCOUNTER — Ambulatory Visit (INDEPENDENT_AMBULATORY_CARE_PROVIDER_SITE_OTHER): Payer: PPO | Admitting: Family Medicine

## 2018-04-16 VITALS — BP 140/70 | HR 78 | Temp 98.0°F | Resp 16

## 2018-04-16 DIAGNOSIS — I10 Essential (primary) hypertension: Secondary | ICD-10-CM

## 2018-04-16 DIAGNOSIS — G4733 Obstructive sleep apnea (adult) (pediatric): Secondary | ICD-10-CM

## 2018-04-16 DIAGNOSIS — M47817 Spondylosis without myelopathy or radiculopathy, lumbosacral region: Secondary | ICD-10-CM

## 2018-04-16 DIAGNOSIS — M5136 Other intervertebral disc degeneration, lumbar region: Secondary | ICD-10-CM

## 2018-04-16 DIAGNOSIS — E119 Type 2 diabetes mellitus without complications: Secondary | ICD-10-CM

## 2018-04-16 MED ORDER — MELOXICAM 7.5 MG PO TABS: 7.5000 mg | ORAL_TABLET | Freq: Two times a day (BID) | ORAL | 12 refills | Status: DC

## 2018-04-16 NOTE — Progress Notes (Signed)
Manuel Stafford.  MRN: [id number] DOB: 1946/04/22  Subjective:  HPI   The patient is a 72 year old male who presents for evaluation of back pain.  He had been taking Tramadol and Meloxicam for about 1 year.   The patient states that the Meloxicam was discontinued about one week ago when it was time to refill it.  He states that since that time he has been in extreme pain.  Patient Active Problem List   Diagnosis Date Noted  . Shoulder pain 03/21/2016  . Flu vaccine need 03/21/2016  . Hydronephrosis with urinary obstruction due to ureteral calculus 09/26/2015  . Microscopic hematuria 09/26/2015  . Hypertensive left ventricular hypertrophy 09/13/2015  . Nephrolithiasis 08/29/2015  . Right ureteral stone 08/29/2015  . Abnormal ECG 08/22/2015  . Benign essential HTN 08/22/2015  . Combined fat and carbohydrate induced hyperlipemia 08/22/2015  . Breathlessness on exertion 08/22/2015  . Lung nodules 04/28/2015  . Allergic rhinitis 12/31/2014  . Diabetes ([place]) 12/31/2014  . Failure of erection 12/31/2014  . Fatty liver disease, nonalcoholic 93/71/6967  . Acid reflux 12/31/2014  . BP (high blood pressure) 12/31/2014  . HLD (hyperlipidemia) 12/31/2014  . Lumbosacral spondylosis without myelopathy 12/31/2014  . Adiposity 12/31/2014  . Herpes zona 12/31/2014  . Disorder of shoulder 12/31/2014  . Apnea, sleep 12/31/2014  . Avitaminosis D 12/31/2014  . DDD (degenerative disc disease), lumbar 11/30/2013  . Back ache 11/30/2013    Past Medical History:  Diagnosis Date  . Back pain    lower  . Diabetes mellitus without complication [facility])    Patient takes Metformin.  . Elevated lipids   . GERD (gastroesophageal reflux disease)   . Hemorrhoids 2017  . Hypertension   . Kidney stone 2017    Social History   Socioeconomic History  . Marital status: Married    Spouse name: Not on file  . Number of children: 2  . Years of education: Not on file  . Highest education level:  Associate degree: occupational, Hotel manager, or vocational program  Occupational History  . 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 Financial resource strain: Not hard at all  . Food insecurity:    Worry: Never true    Inability: Never true  . Transportation needs:    Medical: No    Non-medical: No  Tobacco Use  . Smoking status: Former Smoker    Packs/day: 2.00    Years: 15.00    Pack years: 30.00    Types: Cigarettes    Last attempt to quit: 06/25/1974    Years since quitting: 43.8  . Smokeless tobacco: Never Used  Substance and Sexual Activity  . Alcohol use: No  . Drug use: No  . Sexual activity: Not Currently  Lifestyle  . Physical activity:    Days per week: Not on file    Minutes per session: Not on file  . Stress: Not at all  Relationships  . Social connections:    Talks on phone: Not on file    Gets together: Not on file    Attends religious service: Not on file    Active member of club or organization: Not on file    Attends meetings of clubs or organizations: Not on file    Relationship status: Not on file  . Intimate partner violence:    Fear of current or ex partner: Not on file    Emotionally abused:  Not on file    Physically abused: Not on file    Forced sexual activity: Not on file  Other Topics Concern  . Not on file  Social History Narrative  . Not on file    Outpatient Encounter Medications as of 04/16/2018  Medication Sig Note  . aspirin 81 MG tablet Take 81 mg by mouth daily.  12/30/2014: Received from: [facility]  . Cholecalciferol (VITAMIN D3) 2000 UNITS capsule Take 2,000 Units by mouth daily.   . diphenhydrAMINE (BENADRYL ALLERGY) 25 mg capsule Take by mouth.  12/30/2014: Received from: [facility]  . glucose blood test strip 1 each by Other route 2 (two) times daily as needed for other. Use as instructed   . hydrochlorothiazide (HYDRODIURIL) 25 MG  tablet TAKE 1 TABLET (25 MG TOTAL) BY MOUTH DAILY.   . meclizine (ANTIVERT) 25 MG tablet Take 2 tablets (50 mg total) by mouth 3 (three) times daily as needed for dizziness.   . metFORMIN (GLUCOPHAGE) 1000 MG tablet TAKE 1 TABLET (1,000 MG TOTAL) BY MOUTH 2 (TWO) TIMES DAILY WITH A MEAL.   . MULTIPLE VITAMIN PO Take by mouth daily.  12/30/2014: Received from: [facility]  . omeprazole (PRILOSEC) 20 MG capsule TAKE 1 CAPSULE BY MOUTH EVERY DAY   . rosuvastatin (CRESTOR) 20 MG tablet TAKE 1 TABLET (20 MG TOTAL) BY MOUTH DAILY.   . Saw Palmetto, Serenoa repens, (SAW PALMETTO BERRY) 160 MG CAPS Take 2 tablets by mouth daily.  12/30/2014: Received from: [facility]  . sildenafil (REVATIO) 20 MG tablet TAKE 1 TABLET BY MOUTH 3 TIMES DAILY (Patient taking differently: TAKE 1 TABLET BY MOUTH 3 TIMES DAILY as needed)   . telmisartan (MICARDIS) 40 MG tablet TAKE 1 TABLET BY MOUTH EVERY DAY   . tiZANidine (ZANAFLEX) 4 MG tablet TAKE 1 TABLET (4 MG TOTAL) BY MOUTH AT BEDTIME.   . traMADol (ULTRAM) 50 MG tablet TAKE 1 TABLET BY MOUTH 3 TIMES A DAY AS NEEDED FOR BACK OR LEG PAIN   . meloxicam (MOBIC) 7.5 MG tablet Take 7.5 mg by mouth 2 (two) times daily.    No facility-administered encounter medications on file as of 04/16/2018.     Allergies  Allergen Reactions  . Actos [Pioglitazone]     Muscle weakness    Review of Systems  Constitutional: Negative.   HENT: Negative.   Eyes: Negative.   Respiratory: Negative for cough, shortness of breath and wheezing.   Cardiovascular: Positive for chest pain. Negative for palpitations.  Gastrointestinal: Negative.   Musculoskeletal: Positive for back pain.  Skin: Negative.   Neurological: Negative.   Endo/Heme/Allergies: Negative.   Psychiatric/Behavioral: Negative.     Objective:  BP 140/70 (BP Location: Right Arm, Patient Position: Sitting, Cuff Size: Normal)   Pulse 78   Temp 98 F (36.7 C) (Oral)   Resp 16    Physical Exam  Constitutional: He is oriented to person, place, and time and well-developed, well-nourished, and in no distress.  HENT:  Head: Normocephalic and atraumatic.  Right Ear: External ear normal.  Left Ear: External ear normal.  Nose: Nose normal.  Eyes: Conjunctivae are normal. No scleral icterus.  Neck: No thyromegaly present.  Cardiovascular: Normal rate, regular rhythm and normal heart sounds.  Pulmonary/Chest: Effort normal and breath sounds normal.  Abdominal: Soft.  Musculoskeletal: He exhibits no edema.  Neurological: He is alert and oriented to person, place, and time. Gait normal. GCS score is 15.  Skin: Skin is  warm and dry.  Psychiatric: Mood, memory, affect and judgment normal.    Assessment and Plan :  1. DDD (degenerative disc disease), lumbar Meloxicam prn,discussed risks of GI/Renal issues. Tramadol may be good option prn. 2. Lumbosacral spondylosis without myelopathy   3. Type 2 diabetes mellitus without complication, unspecified whether long term insulin use ([place])   4. Obstructive sleep apnea syndrome   5. Essential hypertension  I have done the exam and reviewed the chart and it is accurate to the best of my knowledge. Development worker, community has been used and  any errors in dictation or transcription are unintentional. Miguel Aschoff M.D. [facility]

## 2018-05-13 ENCOUNTER — Ambulatory Visit (INDEPENDENT_AMBULATORY_CARE_PROVIDER_SITE_OTHER): Payer: PPO | Admitting: Family Medicine

## 2018-05-13 VITALS — BP 168/80 | HR 84 | Temp 98.1°F | Resp 18

## 2018-05-13 DIAGNOSIS — M5416 Radiculopathy, lumbar region: Secondary | ICD-10-CM

## 2018-05-13 DIAGNOSIS — E119 Type 2 diabetes mellitus without complications: Secondary | ICD-10-CM | POA: Diagnosis not present

## 2018-05-13 DIAGNOSIS — M5136 Other intervertebral disc degeneration, lumbar region: Secondary | ICD-10-CM | POA: Diagnosis not present

## 2018-05-13 MED ORDER — HYDROCODONE-ACETAMINOPHEN 10-325 MG PO TABS: 1.0000 | ORAL_TABLET | ORAL | 0 refills | Status: DC | PRN

## 2018-05-13 MED ORDER — KETOROLAC TROMETHAMINE 60 MG/2ML IM SOLN
60.0000 mg | Freq: Once | INTRAMUSCULAR | Status: AC
  Administered 2018-05-13: 60 mg via INTRAMUSCULAR

## 2018-05-13 MED ORDER — PREDNISONE 10 MG PO TABS: 10.0000 mg | ORAL_TABLET | Freq: Every day | ORAL | 0 refills | Status: DC

## 2018-05-13 NOTE — Progress Notes (Signed)
Manuel Stafford.  MRN: [id number] DOB: 1945-07-04  Subjective:  HPI   The patient is a 72 year old male who presents for back pain.  He states he has been having trouble with his back for about a month.  On Friday (4 days ago) he was sitting and when he got up he had a sudden sharp, stabbing pain in the back,n the opposite side of his original pain.  The pain has been constant since then and worse with any kind of movement.  The pain was so bad on the second day he had to have assistance walking.  He has radiation down his legs with numbness in the left leg.  There is also weakness of the left leg.   Patient Active Problem List   Diagnosis Date Noted  . Shoulder pain 03/21/2016  . Flu vaccine need 03/21/2016  . Hydronephrosis with urinary obstruction due to ureteral calculus 09/26/2015  . Microscopic hematuria 09/26/2015  . Hypertensive left ventricular hypertrophy 09/13/2015  . Nephrolithiasis 08/29/2015  . Right ureteral stone 08/29/2015  . Abnormal ECG 08/22/2015  . Benign essential HTN 08/22/2015  . Combined fat and carbohydrate induced hyperlipemia 08/22/2015  . Breathlessness on exertion 08/22/2015  . Lung nodules 04/28/2015  . Allergic rhinitis 12/31/2014  . Diabetes ([place]) 12/31/2014  . Failure of erection 12/31/2014  . Fatty liver disease, nonalcoholic 78/29/5621  . Acid reflux 12/31/2014  . BP (high blood pressure) 12/31/2014  . HLD (hyperlipidemia) 12/31/2014  . Lumbosacral spondylosis without myelopathy 12/31/2014  . Adiposity 12/31/2014  . Herpes zona 12/31/2014  . Disorder of shoulder 12/31/2014  . Apnea, sleep 12/31/2014  . Avitaminosis D 12/31/2014  . DDD (degenerative disc disease), lumbar 11/30/2013  . Back ache 11/30/2013    Past Medical History:  Diagnosis Date  . Back pain    lower  . Diabetes mellitus without complication [facility])    Patient takes Metformin.  . Elevated lipids   . GERD (gastroesophageal reflux disease)   . Hemorrhoids 2017  .  Hypertension   . Kidney stone 2017    Social History   Socioeconomic History  . Marital status: Married    Spouse name: Not on file  . Number of children: 2  . Years of education: Not on file  . Highest education level: Associate degree: occupational, Hotel manager, or vocational program  Occupational History  . 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 Financial resource strain: Not hard at all  . Food insecurity:    Worry: Never true    Inability: Never true  . Transportation needs:    Medical: No    Non-medical: No  Tobacco Use  . Smoking status: Former Smoker    Packs/day: 2.00    Years: 15.00    Pack years: 30.00    Types: Cigarettes    Last attempt to quit: 06/25/1974    Years since quitting: 43.9  . Smokeless tobacco: Never Used  Substance and Sexual Activity  . Alcohol use: No  . Drug use: No  . Sexual activity: Not Currently  Lifestyle  . Physical activity:    Days per week: Not on file    Minutes per session: Not on file  . Stress: Not at all  Relationships  . Social connections:    Talks on phone: Not on file    Gets together: Not on file    Attends religious service: Not on file  Active member of club or organization: Not on file    Attends meetings of clubs or organizations: Not on file    Relationship status: Not on file  . Intimate partner violence:    Fear of current or ex partner: Not on file    Emotionally abused: Not on file    Physically abused: Not on file    Forced sexual activity: Not on file  Other Topics Concern  . Not on file  Social History Narrative  . Not on file    Outpatient Encounter Medications as of 05/13/2018  Medication Sig Note  . aspirin 81 MG tablet Take 81 mg by mouth daily.  12/30/2014: Received from: [facility]  . Cholecalciferol (VITAMIN D3) 2000 UNITS capsule Take 2,000 Units by mouth daily.   . diphenhydrAMINE (BENADRYL ALLERGY) 25 mg capsule  Take by mouth.  12/30/2014: Received from: [facility]  . glucose blood test strip 1 each by Other route 2 (two) times daily as needed for other. Use as instructed   . hydrochlorothiazide (HYDRODIURIL) 25 MG tablet TAKE 1 TABLET (25 MG TOTAL) BY MOUTH DAILY.   . meclizine (ANTIVERT) 25 MG tablet Take 2 tablets (50 mg total) by mouth 3 (three) times daily as needed for dizziness.   . meloxicam (MOBIC) 7.5 MG tablet Take 1 tablet (7.5 mg total) by mouth 2 (two) times daily.   . metFORMIN (GLUCOPHAGE) 1000 MG tablet TAKE 1 TABLET (1,000 MG TOTAL) BY MOUTH 2 (TWO) TIMES DAILY WITH A MEAL.   . MULTIPLE VITAMIN PO Take by mouth daily.  12/30/2014: Received from: [facility]  . omeprazole (PRILOSEC) 20 MG capsule TAKE 1 CAPSULE BY MOUTH EVERY DAY   . rosuvastatin (CRESTOR) 20 MG tablet TAKE 1 TABLET (20 MG TOTAL) BY MOUTH DAILY.   . Saw Palmetto, Serenoa repens, (SAW PALMETTO BERRY) 160 MG CAPS Take 2 tablets by mouth daily.  12/30/2014: Received from: [facility]  . sildenafil (REVATIO) 20 MG tablet TAKE 1 TABLET BY MOUTH 3 TIMES DAILY (Patient taking differently: TAKE 1 TABLET BY MOUTH 3 TIMES DAILY as needed)   . telmisartan (MICARDIS) 40 MG tablet TAKE 1 TABLET BY MOUTH EVERY DAY   . tiZANidine (ZANAFLEX) 4 MG tablet TAKE 1 TABLET (4 MG TOTAL) BY MOUTH AT BEDTIME.   . traMADol (ULTRAM) 50 MG tablet TAKE 1 TABLET BY MOUTH 3 TIMES A DAY AS NEEDED FOR BACK OR LEG PAIN    No facility-administered encounter medications on file as of 05/13/2018.     Allergies  Allergen Reactions  . Actos [Pioglitazone]     Muscle weakness    Review of Systems  Constitutional: Negative.   HENT: Negative.   Eyes: Negative.   Respiratory: Negative for cough, shortness of breath and wheezing.   Cardiovascular: Negative for chest pain, palpitations and leg swelling.  Gastrointestinal: Negative.   Musculoskeletal: Positive for back pain and myalgias.  Skin: Negative.     Endo/Heme/Allergies: Negative.   Psychiatric/Behavioral: Negative.     Objective:  BP (!) 168/80 (BP Location: Right Arm, Patient Position: Sitting, Cuff Size: Normal)   Pulse 84   Temp 98.1 F (36.7 C) (Oral)   Resp 18   SpO2 94%   Physical Exam  Constitutional: He is oriented to person, place, and time and well-developed, well-nourished, and in no distress.  HENT:  Head: Normocephalic and atraumatic.  Eyes: Conjunctivae are normal. No scleral icterus.  Neck: No thyromegaly present.  Cardiovascular: Normal rate, regular rhythm  and normal heart sounds.  Pulmonary/Chest: Effort normal.  Abdominal: Soft.  Musculoskeletal: He exhibits no edema.  Neurological: He is alert and oriented to person, place, and time. Gait normal. GCS score is 15.  Positive straight leg raise on the left.  Mild weakness of quadriceps.  Skin: Skin is warm and dry.  Psychiatric: Mood, memory, affect and judgment normal.    Assessment and Plan :   1. Lumbar radiculopathy May need referral if radicular symptoms persist. - predniSONE (DELTASONE) 10 MG tablet; Take 1 tablet (10 mg total) by mouth daily with breakfast. 12 day taper- 60mg  x 2 days, decrease by 10 mg every other day.  Dispense: 42 tablet; Refill: 0 - ketorolac (TORADOL) injection 60 mg  2. DDD (degenerative disc disease), lumbar  - predniSONE (DELTASONE) 10 MG tablet; Take 1 tablet (10 mg total) by mouth daily with breakfast. 12 day taper- 60mg  x 2 days, decrease by 10 mg every other day.  Dispense: 42 tablet; Refill: 0 - ketorolac (TORADOL) injection 60 mg 3.TIIDM Watch BS while on prednisone.  HPI, Exam and A&P Transcribed under the direction and in the presence of Miguel Aschoff, Brooke Bonito., MD. Electronically Signed: Althea Charon, RMA I have done the exam and reviewed the chart and it is accurate to the best of my knowledge. Development worker, community has been used and  any errors in dictation or transcription are unintentional. Miguel Aschoff  M.D. [facility]

## 2018-05-26 ENCOUNTER — Encounter: Payer: Self-pay | Admitting: Family Medicine

## 2018-05-26 ENCOUNTER — Other Ambulatory Visit: Payer: Self-pay

## 2018-05-26 ENCOUNTER — Ambulatory Visit (INDEPENDENT_AMBULATORY_CARE_PROVIDER_SITE_OTHER): Payer: PPO | Admitting: Family Medicine

## 2018-05-26 VITALS — BP 134/62 | HR 84 | Temp 97.9°F | Resp 16 | Ht 72.0 in | Wt 233.0 lb

## 2018-05-26 DIAGNOSIS — N529 Male erectile dysfunction, unspecified: Secondary | ICD-10-CM | POA: Diagnosis not present

## 2018-05-26 DIAGNOSIS — K219 Gastro-esophageal reflux disease without esophagitis: Secondary | ICD-10-CM

## 2018-05-26 DIAGNOSIS — R251 Tremor, unspecified: Secondary | ICD-10-CM

## 2018-05-26 DIAGNOSIS — I10 Essential (primary) hypertension: Secondary | ICD-10-CM

## 2018-05-26 DIAGNOSIS — Z6831 Body mass index (BMI) 31.0-31.9, adult: Secondary | ICD-10-CM | POA: Diagnosis not present

## 2018-05-26 DIAGNOSIS — J309 Allergic rhinitis, unspecified: Secondary | ICD-10-CM | POA: Diagnosis not present

## 2018-05-26 DIAGNOSIS — E119 Type 2 diabetes mellitus without complications: Secondary | ICD-10-CM | POA: Diagnosis not present

## 2018-05-26 DIAGNOSIS — E6609 Other obesity due to excess calories: Secondary | ICD-10-CM | POA: Diagnosis not present

## 2018-05-26 LAB — POCT GLYCOSYLATED HEMOGLOBIN (HGB A1C): HEMOGLOBIN A1C: 9.4 % — AB (ref 4.0–5.6)

## 2018-05-26 MED ORDER — PIOGLITAZONE HCL 15 MG PO TABS: 15.0000 mg | ORAL_TABLET | Freq: Every day | ORAL | 3 refills | Status: DC

## 2018-05-26 MED ORDER — SILDENAFIL CITRATE 20 MG PO TABS: ORAL_TABLET | ORAL | 10 refills | Status: DC

## 2018-05-26 MED ORDER — FLUTICASONE PROPIONATE 50 MCG/ACT NA SUSP: 2.0000 | Freq: Every day | NASAL | 6 refills | Status: DC

## 2018-05-26 MED ORDER — CETIRIZINE HCL 10 MG PO TABS: 10.0000 mg | ORAL_TABLET | Freq: Every day | ORAL | 11 refills | Status: DC

## 2018-05-26 NOTE — Telephone Encounter (Signed)
Pt states that [facility] has not received this RX.  Please resend.   Thanks,   -Mickel Baas

## 2018-05-26 NOTE — Progress Notes (Signed)
Patient: Manuel Stafford. Male    DOB: 1945-07-17   72 y.o.   MRN: [id number] Visit Date: 05/26/2018  Today's Provider: [name], MD   Chief Complaint  Patient presents with  . Diabetes  . Back Pain  . Tremors   Subjective:    HPI  Diabetes Mellitus Type II, Follow-up:   Lab Results  Component Value Date   HGBA1C 9.4 (A) 05/26/2018   HGBA1C 8.0 (A) 02/20/2018   HGBA1C 8.6 (H) 10/14/2017    Last seen for diabetes 4 months ago.  Management since then includes no changes. He reports good compliance with treatment. He is not having side effects.  Current symptoms include none and have been stable. Home blood sugar records: trend: fluctuating a bit  Episodes of hypoglycemia? no   Current Insulin Regimen: none Most Recent Eye Exam: up to date Weight trend: stable Prior visit with dietician: no Current diet: well balanced Current exercise: no regular exercise, but he does stay active.   Pertinent Labs:    Component Value Date/Time   CHOL 112 10/14/2017 0814   TRIG 167 (H) 10/14/2017 0814   HDL 32 (L) 10/14/2017 0814   LDLCALC 47 10/14/2017 0814   CREATININE 1.07 10/14/2017 0814    Wt Readings from Last 3 Encounters:  05/26/18 233 lb (105.7 kg)  02/20/18 235 lb (106.6 kg)  12/16/17 235 lb (106.6 kg)   Back pain Patient was last seen in the office 2 weeks ago. He was treated for back pain with prednisone 12 day taper. He reports that this has completely resolved.   Tremors Patient also mentions that the tremors in his right hand are worsening. He reports that he has had symptoms for several weeks. Denies any weakness.   Laryngitis Patient reports over the last couple of months that he has had increased postnasal drainage and some laryngitis type symptoms with his voice changing some. No  Reflux reported  Allergies  Allergen Reactions  . Actos [Pioglitazone]     Muscle weakness     Current Outpatient Medications:  .  aspirin 81 MG  tablet, Take 81 mg by mouth daily. , Disp: , Rfl:  .  Cholecalciferol (VITAMIN D3) 2000 UNITS capsule, Take 2,000 Units by mouth daily., Disp: , Rfl:  .  diphenhydrAMINE (BENADRYL ALLERGY) 25 mg capsule, Take by mouth. , Disp: , Rfl:  .  glucose blood test strip, 1 each by Other route 2 (two) times daily as needed for other. Use as instructed, Disp: 100 each, Rfl: 12 .  hydrochlorothiazide (HYDRODIURIL) 25 MG tablet, TAKE 1 TABLET (25 MG TOTAL) BY MOUTH DAILY., Disp: 90 tablet, Rfl: 3 .  HYDROcodone-acetaminophen (NORCO) 10-325 MG tablet, Take 1 tablet by mouth every 4 (four) hours as needed., Disp: 100 tablet, Rfl: 0 .  meclizine (ANTIVERT) 25 MG tablet, Take 2 tablets (50 mg total) by mouth 3 (three) times daily as needed for dizziness., Disp: 60 tablet, Rfl: 1 .  meloxicam (MOBIC) 7.5 MG tablet, Take 1 tablet (7.5 mg total) by mouth 2 (two) times daily., Disp: 60 tablet, Rfl: 12 .  metFORMIN (GLUCOPHAGE) 1000 MG tablet, TAKE 1 TABLET (1,000 MG TOTAL) BY MOUTH 2 (TWO) TIMES DAILY WITH A MEAL., Disp: 180 tablet, Rfl: 3 .  MULTIPLE VITAMIN PO, Take by mouth daily. , Disp: , Rfl:  .  omeprazole (PRILOSEC) 20 MG capsule, TAKE 1 CAPSULE BY MOUTH EVERY DAY, Disp: 90 capsule, Rfl: 3 .  rosuvastatin (CRESTOR)  20 MG tablet, TAKE 1 TABLET (20 MG TOTAL) BY MOUTH DAILY., Disp: 90 tablet, Rfl: 3 .  Saw Palmetto, Serenoa repens, (SAW PALMETTO BERRY) 160 MG CAPS, Take 2 tablets by mouth daily. , Disp: , Rfl:  .  sildenafil (REVATIO) 20 MG tablet, TAKE 1 TABLET BY MOUTH 3 TIMES DAILY (Patient taking differently: TAKE 1 TABLET BY MOUTH 3 TIMES DAILY as needed), Disp: 50 tablet, Rfl: 11 .  telmisartan (MICARDIS) 40 MG tablet, TAKE 1 TABLET BY MOUTH EVERY DAY, Disp: 90 tablet, Rfl: 3 .  tiZANidine (ZANAFLEX) 4 MG tablet, TAKE 1 TABLET (4 MG TOTAL) BY MOUTH AT BEDTIME., Disp: 30 tablet, Rfl: 5 .  traMADol (ULTRAM) 50 MG tablet, TAKE 1 TABLET BY MOUTH 3 TIMES A DAY AS NEEDED FOR BACK OR LEG PAIN, Disp: 180 tablet, Rfl:  1 .  predniSONE (DELTASONE) 10 MG tablet, Take 1 tablet (10 mg total) by mouth daily with breakfast. 12 day taper- 60mg  x 2 days, decrease by 10 mg every other day. (Patient not taking: Reported on 05/26/2018), Disp: 42 tablet, Rfl: 0  Review of Systems  Constitutional: Negative.   HENT: Positive for postnasal drip and voice change.   Eyes: Negative.   Respiratory: Negative for cough and shortness of breath.   Cardiovascular: Negative for chest pain and palpitations.  Endocrine: Negative for cold intolerance, heat intolerance, polyphagia and polyuria.  Musculoskeletal: Negative.   Allergic/Immunologic: Negative.   Neurological: Positive for tremors. Negative for dizziness, light-headedness and headaches.  Hematological: Negative.   Psychiatric/Behavioral: Negative.     Social History   Tobacco Use  . Smoking status: Former Smoker    Packs/day: 2.00    Years: 15.00    Pack years: 30.00    Types: Cigarettes    Last attempt to quit: 06/25/1974    Years since quitting: 43.9  . Smokeless tobacco: Never Used  Substance Use Topics  . Alcohol use: No   Objective:   BP 134/62 (BP Location: Left Arm, Patient Position: Sitting, Cuff Size: Normal)   Pulse 84   Temp 97.9 F (36.6 C)   Resp 16   Ht 6' (1.829 m)   Wt 233 lb (105.7 kg)   SpO2 95%   BMI 31.60 kg/m  Vitals:   05/26/18 0808  BP: 134/62  Pulse: 84  Resp: 16  Temp: 97.9 F (36.6 C)  SpO2: 95%  Weight: 233 lb (105.7 kg)  Height: 6' (1.829 m)     Physical Exam  Constitutional: He is oriented to person, place, and time. He appears well-developed and well-nourished.  HENT:  Head: Normocephalic and atraumatic.  Right Ear: External ear normal.  Left Ear: External ear normal.  Nose: Nose normal.  Mouth/Throat: Oropharynx is clear and moist.  Eyes: Conjunctivae are normal. No scleral icterus.  Neck: No thyromegaly present.  Cardiovascular: Normal rate, regular rhythm and normal heart sounds.  Pulmonary/Chest: Effort  normal and breath sounds normal.  Abdominal: Soft.  Musculoskeletal: He exhibits no edema.  Lymphadenopathy:    He has no cervical adenopathy.  Neurological: He is alert and oriented to person, place, and time.  Skin: Skin is warm and dry.  Psychiatric: He has a normal mood and affect. His behavior is normal. Judgment and thought content normal.        Assessment & Plan:     1. Type 2 diabetes mellitus without complication, unspecified whether long term insulin use (HCC) Worse control.  Start Actos 15 mg daily.  This does not work or  is  not tolerated we will try either [place] or Trulicity - POCT glycosylated hemoglobin (Hb A1C)--9.4 today - pioglitazone (ACTOS) 15 MG tablet; Take 1 tablet (15 mg total) by mouth daily.  Dispense: 90 tablet; Refill: 3  2. Allergic rhinitis, unspecified seasonality, unspecified trigger  - fluticasone (FLONASE) 50 MCG/ACT nasal spray; Place 2 sprays into both nostrils daily.  Dispense: 16 g; Refill: 6 - cetirizine (ZYRTEC) 10 MG tablet; Take 1 tablet (10 mg total) by mouth daily.  Dispense: 30 tablet; Refill: 11  3. Failure of erection  - sildenafil (REVATIO) 20 MG tablet; Take 1-5 tablets by mouth daily as needed.  Dispense: 50 tablet; Refill: 10  4. Tremor Benign tremor versus possible early Parkinson's.  Discussed  this with patient.  Gait is normal.  Neurology referral if needed or if requested - Ambulatory referral to Neurosurgery  5. Benign essential HTN   6. Gastroesophageal reflux disease, esophagitis presence not specified No symptoms.  7. Class 1 obesity due to excess calories with serious comorbidity and body mass index (BMI) of 31.0 to 31.9 in adult  8.Laryngitis Treat as allergic rhinitis first.  May need referral to ENT.  He is on treatment for reflux.      I have done the exam and reviewed the chart and it is accurate to the best of my knowledge. Development worker, community has been used and  any errors in dictation or transcription  are unintentional. Miguel Aschoff M.D. [place], MD  [facility]

## 2018-05-27 NOTE — Addendum Note (Signed)
Addended by: Wilder Glade on: 05/27/2018 03:24 PM   Modules accepted: Orders

## 2018-06-05 ENCOUNTER — Other Ambulatory Visit: Payer: Self-pay | Admitting: Family Medicine

## 2018-06-05 DIAGNOSIS — M5136 Other intervertebral disc degeneration, lumbar region: Secondary | ICD-10-CM

## 2018-06-05 NOTE — Telephone Encounter (Signed)
[facility], faxed refill request for the following medications:  tiZANidine (ZANAFLEX) 4 MG tablet  90 day supply  Last Rx: 12/24/17 Please advise. Thanks TNP

## 2018-06-06 ENCOUNTER — Other Ambulatory Visit: Payer: Self-pay | Admitting: Family Medicine

## 2018-06-06 DIAGNOSIS — M5136 Other intervertebral disc degeneration, lumbar region: Secondary | ICD-10-CM

## 2018-06-07 MED ORDER — TIZANIDINE HCL 4 MG PO TABS: ORAL_TABLET | ORAL | 1 refills | Status: DC

## 2018-07-24 ENCOUNTER — Other Ambulatory Visit: Payer: Self-pay | Admitting: Family Medicine

## 2018-07-24 DIAGNOSIS — M5136 Other intervertebral disc degeneration, lumbar region: Secondary | ICD-10-CM

## 2018-07-28 NOTE — Telephone Encounter (Signed)
Please review

## 2018-07-31 DIAGNOSIS — E538 Deficiency of other specified B group vitamins: Secondary | ICD-10-CM | POA: Diagnosis not present

## 2018-07-31 DIAGNOSIS — R251 Tremor, unspecified: Secondary | ICD-10-CM | POA: Diagnosis not present

## 2018-07-31 DIAGNOSIS — E559 Vitamin D deficiency, unspecified: Secondary | ICD-10-CM | POA: Diagnosis not present

## 2018-07-31 DIAGNOSIS — R259 Unspecified abnormal involuntary movements: Secondary | ICD-10-CM | POA: Diagnosis not present

## 2018-08-01 ENCOUNTER — Other Ambulatory Visit: Payer: Self-pay | Admitting: Neurology

## 2018-08-01 DIAGNOSIS — R251 Tremor, unspecified: Secondary | ICD-10-CM

## 2018-08-10 ENCOUNTER — Ambulatory Visit
Admission: RE | Admit: 2018-08-10 | Discharge: 2018-08-10 | Disposition: A | Discharge status: Home / Self Care | Payer: PPO | Source: Ambulatory Visit | Attending: Neurology | Admitting: Neurology

## 2018-08-10 DIAGNOSIS — R251 Tremor, unspecified: Secondary | ICD-10-CM | POA: Diagnosis not present

## 2018-08-10 MED ORDER — GADOBUTROL 1 MMOL/ML IV SOLN
10.0000 mL | Freq: Once | INTRAVENOUS | Status: AC | PRN
  Administered 2018-08-10: 10 mL via INTRAVENOUS

## 2018-08-11 DIAGNOSIS — E538 Deficiency of other specified B group vitamins: Secondary | ICD-10-CM | POA: Diagnosis not present

## 2018-08-18 DIAGNOSIS — E538 Deficiency of other specified B group vitamins: Secondary | ICD-10-CM | POA: Diagnosis not present

## 2018-08-20 ENCOUNTER — Other Ambulatory Visit: Payer: Self-pay | Admitting: Family Medicine

## 2018-08-25 DIAGNOSIS — E538 Deficiency of other specified B group vitamins: Secondary | ICD-10-CM | POA: Diagnosis not present

## 2018-09-03 DIAGNOSIS — E538 Deficiency of other specified B group vitamins: Secondary | ICD-10-CM | POA: Diagnosis not present

## 2018-09-17 ENCOUNTER — Ambulatory Visit: Payer: Self-pay | Admitting: Family Medicine

## 2018-09-25 ENCOUNTER — Ambulatory Visit: Payer: Self-pay

## 2018-10-17 ENCOUNTER — Other Ambulatory Visit: Payer: Self-pay | Admitting: Family Medicine

## 2018-11-18 ENCOUNTER — Other Ambulatory Visit: Payer: Self-pay | Admitting: Family Medicine

## 2018-11-18 DIAGNOSIS — M5136 Other intervertebral disc degeneration, lumbar region: Secondary | ICD-10-CM

## 2018-12-08 ENCOUNTER — Ambulatory Visit (INDEPENDENT_AMBULATORY_CARE_PROVIDER_SITE_OTHER): Payer: PPO | Admitting: Family Medicine

## 2018-12-08 ENCOUNTER — Other Ambulatory Visit: Payer: Self-pay

## 2018-12-08 ENCOUNTER — Encounter: Payer: Self-pay | Admitting: Family Medicine

## 2018-12-08 VITALS — BP 138/75 | HR 85 | Temp 97.7°F | Resp 16 | Ht 72.0 in | Wt 239.0 lb

## 2018-12-08 DIAGNOSIS — E119 Type 2 diabetes mellitus without complications: Secondary | ICD-10-CM | POA: Diagnosis not present

## 2018-12-08 DIAGNOSIS — K5732 Diverticulitis of large intestine without perforation or abscess without bleeding: Secondary | ICD-10-CM | POA: Diagnosis not present

## 2018-12-08 DIAGNOSIS — I10 Essential (primary) hypertension: Secondary | ICD-10-CM

## 2018-12-08 MED ORDER — CIPROFLOXACIN HCL 500 MG PO TABS: 500.0000 mg | ORAL_TABLET | Freq: Two times a day (BID) | ORAL | 1 refills | Status: DC

## 2018-12-08 MED ORDER — METRONIDAZOLE 500 MG PO TABS: 500.0000 mg | ORAL_TABLET | Freq: Three times a day (TID) | ORAL | 1 refills | Status: DC

## 2018-12-08 NOTE — Progress Notes (Signed)
Patient: Manuel Stafford. Male    DOB: Jan 05, 1946   73 y.o.   MRN: [id number] Visit Date: 12/08/2018  Today's Provider: [name], MD   Chief Complaint  Patient presents with  . diverticulitis flare   Subjective:    HPI Patient comes in today c/o another diverticulitis flare. He reports that he has had symptoms for about 4 days. He was treated over the phone with the health nurse from his insurance co because he was not able to be seen in the office. He was prescribed cipro 500mg  and flagyl, and reports that he is tolerating the medications well. He reports that he is still having pain, but it has improved.  No fevers or sytemic symptoms. No rectal bleeding or diarrhea/constipation. Allergies  Allergen Reactions  . Actos [Pioglitazone]     Muscle weakness     Current Outpatient Medications:  .  aspirin 81 MG tablet, Take 81 mg by mouth daily. , Disp: , Rfl:  .  cetirizine (ZYRTEC) 10 MG tablet, Take 1 tablet (10 mg total) by mouth daily., Disp: 30 tablet, Rfl: 11 .  Cholecalciferol (VITAMIN D3) 2000 UNITS capsule, Take 2,000 Units by mouth daily., Disp: , Rfl:  .  diphenhydrAMINE (BENADRYL ALLERGY) 25 mg capsule, Take by mouth. , Disp: , Rfl:  .  fluticasone (FLONASE) 50 MCG/ACT nasal spray, Place 2 sprays into both nostrils daily., Disp: 16 g, Rfl: 6 .  glucose blood test strip, 1 each by Other route 2 (two) times daily as needed for other. Use as instructed, Disp: 100 each, Rfl: 12 .  hydrochlorothiazide (HYDRODIURIL) 25 MG tablet, TAKE 1 TABLET (25 MG TOTAL) BY MOUTH DAILY., Disp: 90 tablet, Rfl: 3 .  HYDROcodone-acetaminophen (NORCO) 10-325 MG tablet, Take 1 tablet by mouth every 4 (four) hours as needed., Disp: 100 tablet, Rfl: 0 .  meclizine (ANTIVERT) 25 MG tablet, Take 2 tablets (50 mg total) by mouth 3 (three) times daily as needed for dizziness., Disp: 60 tablet, Rfl: 1 .  meloxicam (MOBIC) 7.5 MG tablet, Take 1 tablet (7.5 mg total) by mouth 2 (two)  times daily., Disp: 60 tablet, Rfl: 12 .  metFORMIN (GLUCOPHAGE) 1000 MG tablet, TAKE 1 TABLET (1,000 MG TOTAL) BY MOUTH 2 (TWO) TIMES DAILY WITH A MEAL., Disp: 180 tablet, Rfl: 3 .  MULTIPLE VITAMIN PO, Take by mouth daily. , Disp: , Rfl:  .  omeprazole (PRILOSEC) 20 MG capsule, TAKE 1 CAPSULE BY MOUTH EVERY DAY, Disp: 90 capsule, Rfl: 3 .  pioglitazone (ACTOS) 15 MG tablet, Take 1 tablet (15 mg total) by mouth daily., Disp: 90 tablet, Rfl: 3 .  rosuvastatin (CRESTOR) 20 MG tablet, TAKE 1 TABLET (20 MG TOTAL) BY MOUTH DAILY., Disp: 90 tablet, Rfl: 3 .  Saw Palmetto, Serenoa repens, (SAW PALMETTO BERRY) 160 MG CAPS, Take 2 tablets by mouth daily. , Disp: , Rfl:  .  sildenafil (REVATIO) 20 MG tablet, Take 1-5 tablets by mouth daily as needed., Disp: 50 tablet, Rfl: 10 .  telmisartan (MICARDIS) 40 MG tablet, TAKE 1 TABLET BY MOUTH EVERY DAY, Disp: 90 tablet, Rfl: 3 .  tiZANidine (ZANAFLEX) 4 MG tablet, TAKE 1 TABLET (4 MG TOTAL) BY MOUTH AT BEDTIME., Disp: 90 tablet, Rfl: 1 .  traMADol (ULTRAM) 50 MG tablet, TAKE 1 TABLET BY MOUTH 3 TIMES A DAY AS NEEDED FOR BACK OR LEG PAIN, Disp: 180 tablet, Rfl: 1 .  predniSONE (DELTASONE) 10 MG tablet, Take 1 tablet (10 mg  total) by mouth daily with breakfast. 12 day taper- 60mg  x 2 days, decrease by 10 mg every other day. (Patient not taking: Reported on 05/26/2018), Disp: 42 tablet, Rfl: 0  Review of Systems  Constitutional: Positive for appetite change. Negative for activity change, chills, diaphoresis, fatigue, fever and unexpected weight change.  Respiratory: Negative for shortness of breath.   Cardiovascular: Negative for chest pain and palpitations.  Gastrointestinal: Positive for abdominal pain. Negative for anal bleeding, blood in stool, constipation, diarrhea, nausea, rectal pain and vomiting.  Endocrine: Negative.   Genitourinary: Negative for difficulty urinating, dysuria, flank pain, frequency and urgency.  Skin: Negative for color change, pallor,  rash and wound.  Allergic/Immunologic: Negative.   Neurological: Negative for dizziness, light-headedness and headaches.  Psychiatric/Behavioral: Negative.     Social History   Tobacco Use  . Smoking status: Former Smoker    Packs/day: 2.00    Years: 15.00    Pack years: 30.00    Types: Cigarettes    Quit date: 06/25/1974    Years since quitting: 44.4  . Smokeless tobacco: Never Used  Substance Use Topics  . Alcohol use: No      Objective:   BP 138/75 (BP Location: Right Arm, Patient Position: Sitting, Cuff Size: Large)   Pulse 85   Temp 97.7 F (36.5 C)   Resp 16   Ht 6' (1.829 m)   Wt 239 lb (108.4 kg)   BMI 32.41 kg/m  Vitals:   12/08/18 0933  BP: 138/75  Pulse: 85  Resp: 16  Temp: 97.7 F (36.5 C)  Weight: 239 lb (108.4 kg)  Height: 6' (1.829 m)     Physical Exam Vitals signs reviewed.  Constitutional:      Appearance: He is well-developed.  HENT:     Head: Normocephalic.  Eyes:     General: No scleral icterus.    Conjunctiva/sclera: Conjunctivae normal.  Neck:     Thyroid: No thyromegaly.  Cardiovascular:     Rate and Rhythm: Normal rate and regular rhythm.  Pulmonary:     Effort: Pulmonary effort is normal.  Abdominal:     Palpations: Abdomen is soft. There is no mass.     Tenderness: There is abdominal tenderness. There is no guarding.     Comments: Mild LLQ tenderness.  Skin:    General: Skin is warm and dry.  Neurological:     General: No focal deficit present.     Mental Status: He is alert and oriented to person, place, and time. Mental status is at baseline.  Psychiatric:        Mood and Affect: Mood normal.        Behavior: Behavior normal.        Thought Content: Thought content normal.        Judgment: Judgment normal.         Assessment & Plan    1. Diverticulitis of large intestine without perforation or abscess without bleeding Cipro/flagyl refilled. May need surgical or GI  referral if persistent or recurrent.  2.  Benign essential HTN   3. Type 2 diabetes mellitus without complication, unspecified whether long term insulin use ([place])     I have done the exam and reviewed the above chart and it is accurate to the best of my knowledge. Development worker, community has been used in this note in any air is in the dictation or transcription are unintentional.  [name], MD  [place]

## 2019-01-26 ENCOUNTER — Other Ambulatory Visit: Payer: Self-pay

## 2019-02-02 DIAGNOSIS — R259 Unspecified abnormal involuntary movements: Secondary | ICD-10-CM | POA: Diagnosis not present

## 2019-02-02 DIAGNOSIS — R251 Tremor, unspecified: Secondary | ICD-10-CM | POA: Diagnosis not present

## 2019-02-02 DIAGNOSIS — E538 Deficiency of other specified B group vitamins: Secondary | ICD-10-CM | POA: Diagnosis not present

## 2019-02-12 ENCOUNTER — Other Ambulatory Visit: Payer: Self-pay | Admitting: Family Medicine

## 2019-02-12 DIAGNOSIS — M5136 Other intervertebral disc degeneration, lumbar region: Secondary | ICD-10-CM

## 2019-02-12 DIAGNOSIS — M51369 Other intervertebral disc degeneration, lumbar region without mention of lumbar back pain or lower extremity pain: Secondary | ICD-10-CM

## 2019-03-09 NOTE — Progress Notes (Signed)
Subjective:   Manuel R Stanlee Danh. is a 73 y.o. male who presents for Medicare Annual/Subsequent preventive examination.    This visit is being conducted through telemedicine due to the COVID-19 pandemic. This patient has given me verbal consent via doximity to conduct this visit, patient states they are participating from their home address. Some vital signs may be absent or patient reported.    Patient identification: identified by name, DOB, and current address  Review of Systems:  N/A  Cardiac Risk Factors include: advanced age (>68men, >34 women);diabetes mellitus;dyslipidemia;hypertension;male gender     Objective:    Vitals: There were no vitals taken for this visit.  There is no height or weight on file to calculate BMI. Unable to obtain vitals due to visit being conducted via telephonically.   Advanced Directives 03/10/2019 09/23/2017 08/22/2016 07/24/2016 09/18/2015 09/06/2015 08/10/2015  Does Patient Have a Medical Advance Directive? Yes Yes No Yes Yes Yes Yes  Type of Advance Directive Living will Living will - Living will Living will - [place];Living will  Does patient want to make changes to medical advance directive? - - - - No - Patient declined - -  Copy of [place] in [place] History   Tobacco Use  Smoking Status Former Smoker  . Packs/day: 2.00  . Years: 15.00  . Pack years: 30.00  . Types: Cigarettes  . Quit date: 06/25/1974  . Years since quitting: 44.7  Smokeless Tobacco Never Used     Counseling given: Not Answered   Clinical Intake:  Pre-visit preparation completed: Yes  Pain : No/denies pain Pain Score: 0-No pain     Nutritional Risks: None Diabetes: Yes  How often do you need to have someone help you when you read instructions, pamphlets, or other written materials from your doctor or pharmacy?: 1 - Never   Diabetes:  Is the patient diabetic?  Yes type 2 If  diabetic, was a CBG obtained today?  No  Did the patient bring in their glucometer from home?  No  How often do you monitor your CBG's? Once a day.   Financial Strains and Diabetes Management:  Are you having any financial strains with the device, your supplies or your medication? No .  Does the patient want to be seen by Chronic Care Management for management of their diabetes?  No  Would the patient like to be referred to a Nutritionist or for Diabetic Management?  No   Diabetic Exams:  Diabetic Eye Exam: Completed 02/26/18. Overdue for diabetic eye exam. Pt has been advised about the importance in completing this exam. Next eye exam scheduled 03/2019.  Diabetic Foot Exam: Completed 07/24/16. Pt has been advised about the importance in completing this exam. Note made to follow up on this at next in office visit.    Interpreter Needed?: No  Information entered by :: [facility], LPN  Past Medical History:  Diagnosis Date  . Back pain    lower  . Diabetes mellitus without complication [facility])    Patient takes Metformin.  . Elevated lipids   . GERD (gastroesophageal reflux disease)   . Hemorrhoids 2017  . Hypertension   . Kidney stone 2017  . Parkinsonian features    Past Surgical History:  Procedure Laterality Date  . CIRCUMCISION    . COLONOSCOPY  2007  . COLONOSCOPY WITH PROPOFOL N/A 08/22/2016   Procedure: COLONOSCOPY WITH PROPOFOL;  Surgeon:  Seeplaputhur Robinette Haines, MD;  Location: [facility] ENDOSCOPY;  Service: Endoscopy;  Laterality: N/A;  . CYSTOSCOPY WITH STENT PLACEMENT Right 09/18/2015   Procedure: CYSTOSCOPY WITH STENT PLACEMENT;  Surgeon: Hollice Espy, MD;  Location: [facility] ORS;  Service: Urology;  Laterality: Right;  . CYSTOSCOPY WITH URETEROSCOPY, STONE BASKETRY AND STENT PLACEMENT Right 10/03/2015   Procedure: CYSTOSCOPY WITH URETEROSCOPY, STONE BASKETRY AND STENT PLACEMENT;  Surgeon: Hollice Espy, MD;  Location: [facility] ORS;  Service: Urology;  Laterality: Right;  .  EXTRACORPOREAL SHOCK WAVE LITHOTRIPSY Right 09/15/2015   Procedure: EXTRACORPOREAL SHOCK WAVE LITHOTRIPSY (ESWL);  Surgeon: Nickie Retort, MD;  Location: [facility] ORS;  Service: Urology;  Laterality: Right;  . HERNIA REPAIR    . ROTATOR CUFF REPAIR Right 06/2012   Family History  Problem Relation Age of Onset  . Hypertension Mother   . Diabetes Father   . CAD Father   . Diabetes Sister   . Heart disease Sister   . CAD Sister    Social History   Socioeconomic History  . Marital status: Married    Spouse name: Not on file  . Number of children: 2  . Years of education: Not on file  . Highest education level: Associate degree: occupational, Hotel manager, or vocational program  Occupational History  . Occupation: retired  Scientific laboratory technician  . Financial resource strain: Not hard at all  . Food insecurity    Worry: Never true    Inability: Never true  . Transportation needs    Medical: No    Non-medical: No  Tobacco Use  . Smoking status: Former Smoker    Packs/day: 2.00    Years: 15.00    Pack years: 30.00    Types: Cigarettes    Quit date: 06/25/1974    Years since quitting: 44.7  . Smokeless tobacco: Never Used  Substance and Sexual Activity  . Alcohol use: No  . Drug use: No  . Sexual activity: Not Currently  Lifestyle  . Physical activity    Days per week: 0 days    Minutes per session: 0 min  . Stress: Not at all  Relationships  . Social Herbalist on phone: Patient refused    Gets together: Patient refused    Attends religious service: Patient refused    Active member of club or organization: Patient refused    Attends meetings of clubs or organizations: Patient refused    Relationship status: Patient refused  Other Topics Concern  . Not on file  Social History Narrative  . Not on file    Outpatient Encounter Medications as of 03/10/2019  Medication Sig  . aspirin 81 MG tablet Take 81 mg by mouth daily.   . cetirizine (ZYRTEC) 10 MG tablet Take 1  tablet (10 mg total) by mouth daily.  . Cholecalciferol (VITAMIN D3) 2000 UNITS capsule Take 2,000 Units by mouth daily.  . diphenhydrAMINE (BENADRYL ALLERGY) 25 mg capsule Take by mouth.   . fluticasone (FLONASE) 50 MCG/ACT nasal spray Place 2 sprays into both nostrils daily.  [name] glucose blood test strip 1 each by Other route 2 (two) times daily as needed for other. Use as instructed  . hydrochlorothiazide (HYDRODIURIL) 25 MG tablet TAKE 1 TABLET (25 MG TOTAL) BY MOUTH DAILY.  [name] HYDROcodone-acetaminophen (NORCO) 10-325 MG tablet Take 1 tablet by mouth every 4 (four) hours as needed.  . meclizine (ANTIVERT) 25 MG tablet Take 2 tablets (50 mg total) by mouth 3 (three) times daily as needed for  dizziness.  . meloxicam (MOBIC) 7.5 MG tablet Take 1 tablet (7.5 mg total) by mouth 2 (two) times daily.  . metFORMIN (GLUCOPHAGE) 1000 MG tablet TAKE 1 TABLET (1,000 MG TOTAL) BY MOUTH 2 (TWO) TIMES DAILY WITH A MEAL.  . MULTIPLE VITAMIN PO Take by mouth daily.   [name] omeprazole (PRILOSEC) 20 MG capsule TAKE 1 CAPSULE BY MOUTH EVERY DAY  . rosuvastatin (CRESTOR) 20 MG tablet TAKE 1 TABLET (20 MG TOTAL) BY MOUTH DAILY.  . Saw Palmetto, Serenoa repens, (SAW PALMETTO BERRY) 160 MG CAPS Take 2 tablets by mouth daily.   . sildenafil (REVATIO) 20 MG tablet Take 1-5 tablets by mouth daily as needed.  [name] telmisartan (MICARDIS) 40 MG tablet TAKE 1 TABLET BY MOUTH EVERY DAY  . tiZANidine (ZANAFLEX) 4 MG tablet TAKE 1 TABLET (4 MG TOTAL) BY MOUTH AT BEDTIME.  . traMADol (ULTRAM) 50 MG tablet TAKE 1 TABLET BY MOUTH 3 TIMES A DAY AS NEEDED FOR BACK OR LEG PAIN  . ciprofloxacin (CIPRO) 500 MG tablet Take 1 tablet (500 mg total) by mouth 2 (two) times daily. (Patient not taking: Reported on 03/10/2019)  . metroNIDAZOLE (FLAGYL) 500 MG tablet Take 1 tablet (500 mg total) by mouth 3 (three) times daily. (Patient not taking: Reported on 03/10/2019)  . pioglitazone (ACTOS) 15 MG tablet Take 1 tablet (15 mg total) by mouth daily.  (Patient not taking: Reported on 03/10/2019)  . predniSONE (DELTASONE) 10 MG tablet Take 1 tablet (10 mg total) by mouth daily with breakfast. 12 day taper- 60mg  x 2 days, decrease by 10 mg every other day. (Patient not taking: Reported on 05/26/2018)   No facility-administered encounter medications on file as of 03/10/2019.     Activities of Daily Living In your present state of health, do you have any difficulty performing the following activities: 03/10/2019  Hearing? N  Vision? N  Comment Wears eye glasses daily.  Difficulty concentrating or making decisions? N  Walking or climbing stairs? N  Dressing or bathing? N  Doing errands, shopping? N  Preparing Food and eating ? N  Using the Toilet? N  In the past six months, have you accidently leaked urine? Y  Comment Occasionally unable to make it.  Do you have problems with loss of bowel control? N  Managing your Medications? N  Managing your Finances? N  Housekeeping or managing your Housekeeping? N  Some recent data might be hidden    Patient Care Team: [name], MD as PCP - General (Family Medicine) Thelma Comp, [place] as Consulting Physician (Optometry) Corey Skains, MD as Consulting Physician (Cardiology) Ralene Bathe, MD as Consulting Physician (Dermatology) Hollice Espy, MD as Consulting Physician (Urology) Barbette Merino, NP as Nurse Practitioner (Nurse Practitioner) Vladimir Crofts, MD as Consulting Physician (Neurology) Gayland Curry Hubbard [place] (Neurology)   Assessment:   This is a routine wellness examination for Manuel Stafford.  Exercise Activities and Dietary recommendations Current Exercise Habits: Home exercise routine, Type of exercise: walking, Time (Minutes): 15, Frequency (Times/Week): 7, Weekly Exercise (Minutes/Week): 105, Intensity: Mild  Goals    . DIET - INCREASE WATER INTAKE     Recommend to drink at least 6-8 8oz glasses of water per day.    . Exercise 3x per week (30 min per  time)     Pt to start back exercising 3 times a week for at least 30 minutes at a time.        Fall Risk Fall Risk  03/10/2019 09/23/2017 08/13/2017 07/24/2016 02/07/2015  Falls in the past year? 0 No No No No  Number falls in past yr: 0 - - - -  Injury with Fall? 0 - - - -   FALL RISK PREVENTION PERTAINING TO THE HOME:  Any stairs in or around the home? Yes  If so, are there any without handrails? No   Home free of loose throw rugs in walkways, pet beds, electrical cords, etc? Yes  Adequate lighting in your home to reduce risk of falls? Yes   ASSISTIVE DEVICES UTILIZED TO PREVENT FALLS:  Life alert? No  Use of a cane, walker or w/c? No  Grab bars in the bathroom? No  Shower chair or bench in shower? Yes  Elevated toilet seat or a handicapped toilet? No    TIMED UP AND GO:  Was the test performed? No .    Depression Screen PHQ 2/9 Scores 03/10/2019 09/23/2017 08/13/2017 07/24/2016  PHQ - 2 Score 0 0 0 0    Cognitive Function: Declined today.     6CIT Screen 07/24/2016  What Year? 0 points  What month? 0 points  What time? 0 points  Count back from 20 0 points  Months in reverse 0 points  Repeat phrase 2 points  Total Score 2    Immunization History  Administered Date(s) Administered  . Hepatitis A 08/13/2006  . Influenza, High Dose Seasonal PF 03/23/2015, 03/21/2016, 03/21/2017, 04/09/2018  . Pneumococcal Conjugate-13 05/25/2014  . Pneumococcal Polysaccharide-23 04/08/2012  . Tdap 10/26/2005  . Zoster 08/13/2006, 04/17/2013    Qualifies for Shingles Vaccine? Yes  Zostavax completed 04/17/13. Due for Shingrix. Education has been provided regarding the importance of this vaccine. Pt has been advised to call insurance company to determine out of pocket expense. Advised may also receive vaccine at local pharmacy or Health Dept. Verbalized acceptance and understanding.  Tdap: Although this vaccine is not a covered service during a Wellness Exam, does the patient still  wish to receive this vaccine today?  No .   Flu Vaccine: Due for Flu vaccine. Does the patient want to receive this vaccine today?  No .   Pneumococcal Vaccine: Completed series  Screening Tests Health Maintenance  Topic Date Due  . TETANUS/TDAP  10/27/2015  . FOOT EXAM  07/24/2017  . HEMOGLOBIN A1C  11/25/2018  . INFLUENZA VACCINE  01/24/2019  . OPHTHALMOLOGY EXAM  02/27/2019  . COLONOSCOPY  08/22/2026  . Hepatitis C Screening  Completed  . PNA vac Low Risk Adult  Completed   Cancer Screenings:  Colorectal Screening: Completed 08/22/16. No longer required.   Lung Cancer Screening: (Low Dose CT Chest recommended if Age 35-80 years, 30 pack-year currently smoking OR have quit w/in 15years.) does not qualify.   Additional Screening:  Hepatitis C Screening: Up to date  Dental Screening: Recommended annual dental exams for proper oral hygiene  Community Resource Referral:  CRR required this visit?  No        Plan:  I have personally reviewed and addressed the Medicare Annual Wellness questionnaire and have noted the following in the patient's chart:  A. Medical and social history B. Use of alcohol, tobacco or illicit drugs  C. Current medications and supplements D. Functional ability and status E.  Nutritional status F.  Physical activity G. Advance directives H. List of other physicians I.  Hospitalizations, surgeries, and ER visits in previous 12 months J.  [place] such as hearing and vision if needed, cognitive and depression  L. Referrals and appointments   In addition, I have reviewed and discussed with patient certain preventive protocols, quality metrics, and best practice recommendations. A written personalized care plan for preventive services as well as general preventive health recommendations were provided to patient.   [place]ora Score, [place]  [id number] Nurse Health Advisor  Nurse Notes: Pt needs a diabetic foot exam and Hgb A1c  check at next in office visit. Pt is scheduled for an eye exam in October and is going to the flu clinic tomorrow to receive his flu shot.

## 2019-03-10 ENCOUNTER — Other Ambulatory Visit: Payer: Self-pay

## 2019-03-10 ENCOUNTER — Ambulatory Visit (INDEPENDENT_AMBULATORY_CARE_PROVIDER_SITE_OTHER): Payer: PPO

## 2019-03-10 DIAGNOSIS — Z0000 Encounter for general adult medical examination without abnormal findings: Secondary | ICD-10-CM | POA: Diagnosis not present

## 2019-03-10 NOTE — Patient Instructions (Signed)
Manuel Stafford , Thank you for taking time to come for your Medicare Wellness Visit. I appreciate your ongoing commitment to your health goals. Please review the following plan we discussed and let me know if I can assist you in the future.   Screening recommendations/referrals: Colonoscopy: Up to date, No longer required.  Recommended yearly ophthalmology/optometry visit for glaucoma screening and checkup Recommended yearly dental visit for hygiene and checkup  Vaccinations: Influenza vaccine: Currently due Pneumococcal vaccine: Completed series Tdap vaccine: Pt declines today.  Shingles vaccine: Pt declines today.     Advanced directives: Please bring a copy of your POA (Power of Attorney) and/or Living Will to your next appointment.   Conditions/risks identified: Recommend to increase water intake to 6-8 8 oz glasses a day.   Next appointment: 03/17/19 for a follow up with Dr Rosanna Randy.   Preventive Care 3 Years and Older, Male Preventive care refers to lifestyle choices and visits with your health care provider that can promote health and wellness. What does preventive care include?  A yearly physical exam. This is also called an annual well check.  Dental exams once or twice a year.  Routine eye exams. Ask your health care provider how often you should have your eyes checked.  Personal lifestyle choices, including:  Daily care of your teeth and gums.  Regular physical activity.  Eating a healthy diet.  Avoiding tobacco and drug use.  Limiting alcohol use.  Practicing safe sex.  Taking low doses of aspirin every day.  Taking vitamin and mineral supplements as recommended by your health care provider. What happens during an annual well check? The services and screenings done by your health care provider during your annual well check will depend on your age, overall health, lifestyle risk factors, and family history of disease. Counseling  Your health care provider  may ask you questions about your:  Alcohol use.  Tobacco use.  Drug use.  Emotional well-being.  Home and relationship well-being.  Sexual activity.  Eating habits.  History of falls.  Memory and ability to understand (cognition).  Work and work Statistician. Screening  You may have the following tests or measurements:  Height, weight, and BMI.  Blood pressure.  Lipid and cholesterol levels. These may be checked every 5 years, or more frequently if you are over 52 years old.  Skin check.  Lung cancer screening. You may have this screening every year starting at age 44 if you have a 30-pack-year history of smoking and currently smoke or have quit within the past 15 years.  Fecal occult blood test (FOBT) of the stool. You may have this test every year starting at age 73.  Flexible sigmoidoscopy or colonoscopy. You may have a sigmoidoscopy every 5 years or a colonoscopy every 10 years starting at age 56.  Prostate cancer screening. Recommendations will vary depending on your family history and other risks.  Hepatitis C blood test.  Hepatitis B blood test.  Sexually transmitted disease (STD) testing.  Diabetes screening. This is done by checking your blood sugar (glucose) after you have not eaten for a while (fasting). You may have this done every 1-3 years.  Abdominal aortic aneurysm (AAA) screening. You may need this if you are a current or former smoker.  Osteoporosis. You may be screened starting at age 22 if you are at high risk. Talk with your health care provider about your test results, treatment options, and if necessary, the need for more tests. Vaccines  Your health care  provider may recommend certain vaccines, such as:  Influenza vaccine. This is recommended every year.  Tetanus, diphtheria, and acellular pertussis (Tdap, Td) vaccine. You may need a Td booster every 10 years.  Zoster vaccine. You may need this after age 47.  Pneumococcal 13-valent  conjugate (PCV13) vaccine. One dose is recommended after age 31.  Pneumococcal polysaccharide (PPSV23) vaccine. One dose is recommended after age 50. Talk to your health care provider about which screenings and vaccines you need and how often you need them. This information is not intended to replace advice given to you by your health care provider. Make sure you discuss any questions you have with your health care provider. Document Released: 07/08/2015 Document Revised: 02/29/2016 Document Reviewed: 04/12/2015 Elsevier Interactive Patient Education  2017 [place] Prevention in the Home Falls can cause injuries. They can happen to people of all ages. There are many things you can do to make your home safe and to help prevent falls. What can I do on the outside of my home?  Regularly fix the edges of walkways and driveways and fix any cracks.  Remove anything that might make you trip as you walk through a door, such as a raised step or threshold.  Trim any bushes or trees on the path to your home.  Use bright outdoor lighting.  Clear any walking paths of anything that might make someone trip, such as rocks or tools.  Regularly check to see if handrails are loose or broken. Make sure that both sides of any steps have handrails.  Any raised decks and porches should have guardrails on the edges.  Have any leaves, snow, or ice cleared regularly.  Use sand or salt on walking paths during winter.  Clean up any spills in your garage right away. This includes oil or grease spills. What can I do in the bathroom?  Use night lights.  Install grab bars by the toilet and in the tub and shower. Do not use towel bars as grab bars.  Use non-skid mats or decals in the tub or shower.  If you need to sit down in the shower, use a plastic, non-slip stool.  Keep the floor dry. Clean up any water that spills on the floor as soon as it happens.  Remove soap buildup in the tub or  shower regularly.  Attach bath mats securely with double-sided non-slip rug tape.  Do not have throw rugs and other things on the floor that can make you trip. What can I do in the bedroom?  Use night lights.  Make sure that you have a light by your bed that is easy to reach.  Do not use any sheets or blankets that are too big for your bed. They should not hang down onto the floor.  Have a firm chair that has side arms. You can use this for support while you get dressed.  Do not have throw rugs and other things on the floor that can make you trip. What can I do in the kitchen?  Clean up any spills right away.  Avoid walking on wet floors.  Keep items that you use a lot in easy-to-reach places.  If you need to reach something above you, use a strong step stool that has a grab bar.  Keep electrical cords out of the way.  Do not use floor polish or wax that makes floors slippery. If you must use wax, use non-skid floor wax.  Do not have throw  rugs and other things on the floor that can make you trip. What can I do with my stairs?  Do not leave any items on the stairs.  Make sure that there are handrails on both sides of the stairs and use them. Fix handrails that are broken or loose. Make sure that handrails are as long as the stairways.  Check any carpeting to make sure that it is firmly attached to the stairs. Fix any carpet that is loose or worn.  Avoid having throw rugs at the top or bottom of the stairs. If you do have throw rugs, attach them to the floor with carpet tape.  Make sure that you have a light switch at the top of the stairs and the bottom of the stairs. If you do not have them, ask someone to add them for you. What else can I do to help prevent falls?  Wear shoes that:  Do not have high heels.  Have rubber bottoms.  Are comfortable and fit you well.  Are closed at the toe. Do not wear sandals.  If you use a stepladder:  Make sure that it is fully  opened. Do not climb a closed stepladder.  Make sure that both sides of the stepladder are locked into place.  Ask someone to hold it for you, if possible.  Clearly mark and make sure that you can see:  Any grab bars or handrails.  First and last steps.  Where the edge of each step is.  Use tools that help you move around (mobility aids) if they are needed. These include:  Canes.  Walkers.  Scooters.  Crutches.  Turn on the lights when you go into a dark area. Replace any light bulbs as soon as they burn out.  Set up your furniture so you have a clear path. Avoid moving your furniture around.  If any of your floors are uneven, fix them.  If there are any pets around you, be aware of where they are.  Review your medicines with your doctor. Some medicines can make you feel dizzy. This can increase your chance of falling. Ask your doctor what other things that you can do to help prevent falls. This information is not intended to replace advice given to you by your health care provider. Make sure you discuss any questions you have with your health care provider. Document Released: 04/07/2009 Document Revised: 11/17/2015 Document Reviewed: 07/16/2014 Elsevier Interactive Patient Education  2017 [employer].

## 2019-03-11 ENCOUNTER — Other Ambulatory Visit: Payer: Self-pay

## 2019-03-11 ENCOUNTER — Ambulatory Visit (INDEPENDENT_AMBULATORY_CARE_PROVIDER_SITE_OTHER): Payer: PPO

## 2019-03-11 DIAGNOSIS — Z23 Encounter for immunization: Secondary | ICD-10-CM | POA: Diagnosis not present

## 2019-03-17 ENCOUNTER — Encounter: Payer: Self-pay | Admitting: Family Medicine

## 2019-03-17 ENCOUNTER — Ambulatory Visit (INDEPENDENT_AMBULATORY_CARE_PROVIDER_SITE_OTHER): Payer: PPO | Admitting: Family Medicine

## 2019-03-17 ENCOUNTER — Other Ambulatory Visit: Payer: Self-pay

## 2019-03-17 VITALS — BP 120/73 | Temp 96.9°F | Resp 18 | Wt 241.0 lb

## 2019-03-17 DIAGNOSIS — E7849 Other hyperlipidemia: Secondary | ICD-10-CM | POA: Diagnosis not present

## 2019-03-17 DIAGNOSIS — G4733 Obstructive sleep apnea (adult) (pediatric): Secondary | ICD-10-CM

## 2019-03-17 DIAGNOSIS — E119 Type 2 diabetes mellitus without complications: Secondary | ICD-10-CM | POA: Diagnosis not present

## 2019-03-17 DIAGNOSIS — K5732 Diverticulitis of large intestine without perforation or abscess without bleeding: Secondary | ICD-10-CM

## 2019-03-17 DIAGNOSIS — I10 Essential (primary) hypertension: Secondary | ICD-10-CM

## 2019-03-17 LAB — POCT GLYCOSYLATED HEMOGLOBIN (HGB A1C): Hemoglobin A1C: 8.2 % — AB (ref 4.0–5.6)

## 2019-03-17 MED ORDER — CIPROFLOXACIN HCL 500 MG PO TABS: 500.0000 mg | ORAL_TABLET | Freq: Two times a day (BID) | ORAL | 1 refills | Status: DC

## 2019-03-17 MED ORDER — METRONIDAZOLE 500 MG PO TABS: 500.0000 mg | ORAL_TABLET | Freq: Three times a day (TID) | ORAL | 1 refills | Status: DC

## 2019-03-17 NOTE — Progress Notes (Signed)
Patient: Manuel Stafford. Male    DOB: 04-17-1946   73 y.o.   MRN: [id number] Visit Date: 03/17/2019  Today's Provider: [name], MD   Chief Complaint  Patient presents with  . Diabetes  . Follow-up   Subjective:     HPI  3 month follow up for diabetes. Patient states that his diverticulitis has flared up starting 2 weeks ago . He states that he finished the antibiotics, but not feeling much better. Patient states that he has a dull ache on lower left side.   Diabetes Mellitus Type II, Follow-up:   Lab Results  Component Value Date   HGBA1C 8.2 (A) 03/17/2019   HGBA1C 9.4 (A) 05/26/2018   HGBA1C 8.0 (A) 02/20/2018    Last seen for diabetes 4 months ago.  Management since then includes . He reports good compliance with treatment. He is not having side effects.  Current symptoms include none and have been stable. Home blood sugar records: 100-140  Episodes of hypoglycemia? no   Current insulin regiment: Is not on insulin Most Recent Eye Exam: 1 year Weight trend: stable Prior visit with dietician: Yes  Current exercise: walking Current diet habits: well balanced  Pertinent Labs:    Component Value Date/Time   CHOL 112 10/14/2017 0814   TRIG 167 (H) 10/14/2017 0814   HDL 32 (L) 10/14/2017 0814   LDLCALC 47 10/14/2017 0814   CREATININE 1.07 10/14/2017 0814    Wt Readings from Last 3 Encounters:  03/17/19 241 lb (109.3 kg)  12/08/18 239 lb (108.4 kg)  08/10/18 240 lb (108.9 kg)    ------------------------------------------------------------------------  Allergies  Allergen Reactions  . Actos [Pioglitazone]     Muscle weakness     Current Outpatient Medications:  .  aspirin 81 MG tablet, Take 81 mg by mouth daily. , Disp: , Rfl:  .  cetirizine (ZYRTEC) 10 MG tablet, Take 1 tablet (10 mg total) by mouth daily., Disp: 30 tablet, Rfl: 11 .  Cholecalciferol (VITAMIN D3) 2000 UNITS capsule, Take 2,000 Units by mouth daily., Disp: ,  Rfl:  .  diphenhydrAMINE (BENADRYL ALLERGY) 25 mg capsule, Take by mouth. , Disp: , Rfl:  .  fluticasone (FLONASE) 50 MCG/ACT nasal spray, Place 2 sprays into both nostrils daily., Disp: 16 g, Rfl: 6 .  glucose blood test strip, 1 each by Other route 2 (two) times daily as needed for other. Use as instructed, Disp: 100 each, Rfl: 12 .  hydrochlorothiazide (HYDRODIURIL) 25 MG tablet, TAKE 1 TABLET (25 MG TOTAL) BY MOUTH DAILY., Disp: 90 tablet, Rfl: 3 .  HYDROcodone-acetaminophen (NORCO) 10-325 MG tablet, Take 1 tablet by mouth every 4 (four) hours as needed., Disp: 100 tablet, Rfl: 0 .  meclizine (ANTIVERT) 25 MG tablet, Take 2 tablets (50 mg total) by mouth 3 (three) times daily as needed for dizziness., Disp: 60 tablet, Rfl: 1 .  meloxicam (MOBIC) 7.5 MG tablet, Take 1 tablet (7.5 mg total) by mouth 2 (two) times daily., Disp: 60 tablet, Rfl: 12 .  metFORMIN (GLUCOPHAGE) 1000 MG tablet, TAKE 1 TABLET (1,000 MG TOTAL) BY MOUTH 2 (TWO) TIMES DAILY WITH A MEAL., Disp: 180 tablet, Rfl: 3 .  Misc Natural Products (BLOOD SUGAR BALANCE) TABS, Take by mouth 2 (two) times daily., Disp: , Rfl:  .  MULTIPLE VITAMIN PO, Take by mouth daily. , Disp: , Rfl:  .  omeprazole (PRILOSEC) 20 MG capsule, TAKE 1 CAPSULE BY MOUTH EVERY DAY, Disp: 90  capsule, Rfl: 3 .  rosuvastatin (CRESTOR) 20 MG tablet, TAKE 1 TABLET (20 MG TOTAL) BY MOUTH DAILY., Disp: 90 tablet, Rfl: 3 .  Saw Palmetto, Serenoa repens, (SAW PALMETTO BERRY) 160 MG CAPS, Take 2 tablets by mouth daily. , Disp: , Rfl:  .  sildenafil (REVATIO) 20 MG tablet, Take 1-5 tablets by mouth daily as needed., Disp: 50 tablet, Rfl: 10 .  telmisartan (MICARDIS) 40 MG tablet, TAKE 1 TABLET BY MOUTH EVERY DAY, Disp: 90 tablet, Rfl: 3 .  tiZANidine (ZANAFLEX) 4 MG tablet, TAKE 1 TABLET (4 MG TOTAL) BY MOUTH AT BEDTIME., Disp: 90 tablet, Rfl: 1 .  traMADol (ULTRAM) 50 MG tablet, TAKE 1 TABLET BY MOUTH 3 TIMES A DAY AS NEEDED FOR BACK OR LEG PAIN, Disp: 180 tablet, Rfl: 3  .  ciprofloxacin (CIPRO) 500 MG tablet, Take 1 tablet (500 mg total) by mouth 2 (two) times daily. (Patient not taking: Reported on 03/10/2019), Disp: 14 tablet, Rfl: 1 .  metroNIDAZOLE (FLAGYL) 500 MG tablet, Take 1 tablet (500 mg total) by mouth 3 (three) times daily. (Patient not taking: Reported on 03/10/2019), Disp: 21 tablet, Rfl: 1 .  pioglitazone (ACTOS) 15 MG tablet, Take 1 tablet (15 mg total) by mouth daily. (Patient not taking: Reported on 03/17/2019), Disp: 90 tablet, Rfl: 3 .  predniSONE (DELTASONE) 10 MG tablet, Take 1 tablet (10 mg total) by mouth daily with breakfast. 12 day taper- 60mg  x 2 days, decrease by 10 mg every other day. (Patient not taking: Reported on 05/26/2018), Disp: 42 tablet, Rfl: 0  Review of Systems  Constitutional: Positive for appetite change. Negative for activity change, chills, diaphoresis, fatigue, fever and unexpected weight change.  Eyes: Negative.   Respiratory: Negative for shortness of breath.   Cardiovascular: Negative for chest pain and palpitations.  Gastrointestinal: Positive for abdominal pain. Negative for anal bleeding, blood in stool, constipation, diarrhea, nausea, rectal pain and vomiting.  Endocrine: Negative.   Genitourinary: Negative for difficulty urinating, dysuria, flank pain, frequency and urgency.  Skin: Negative for color change, pallor, rash and wound.  Allergic/Immunologic: Negative.   Neurological: Negative for dizziness, light-headedness and headaches.  Psychiatric/Behavioral: Negative.     Social History   Tobacco Use  . Smoking status: Former Smoker    Packs/day: 2.00    Years: 15.00    Pack years: 30.00    Types: Cigarettes    Quit date: 06/25/1974    Years since quitting: 44.7  . Smokeless tobacco: Never Used  Substance Use Topics  . Alcohol use: No      Objective:   BP 120/73 (BP Location: Right Arm, Patient Position: Sitting, Cuff Size: Large)   Temp (!) 96.9 F (36.1 C) (Temporal)   Resp 18   Wt 241 lb  (109.3 kg)   SpO2 98%   BMI 32.69 kg/m  Vitals:   03/17/19 1129  BP: 120/73  Resp: 18  Temp: (!) 96.9 F (36.1 C)  TempSrc: Temporal  SpO2: 98%  Weight: 241 lb (109.3 kg)  Body mass index is 32.69 kg/m.   Physical Exam Vitals signs reviewed.  Constitutional:      Appearance: He is obese.  HENT:     Head: Normocephalic and atraumatic.     Right Ear: External ear normal.     Left Ear: External ear normal.  Eyes:     General: No scleral icterus.    Conjunctiva/sclera: Conjunctivae normal.  Cardiovascular:     Rate and Rhythm: Normal rate and regular rhythm.  Pulses: Normal pulses.     Heart sounds: Normal heart sounds.  Pulmonary:     Effort: Pulmonary effort is normal.     Breath sounds: Normal breath sounds.  Abdominal:     Palpations: Abdomen is soft.     Tenderness: There is abdominal tenderness.     Comments: Minimal LLQ tenderness without guarding or rebound.  Skin:    General: Skin is warm and dry.  Neurological:     General: No focal deficit present.     Mental Status: He is alert and oriented to person, place, and time.  Psychiatric:        Mood and Affect: Mood normal.        Behavior: Behavior normal.        Thought Content: Thought content normal.        Judgment: Judgment normal.      Results for orders placed or performed in visit on 03/17/19  POCT HgB A1C  Result Value Ref Range   Hemoglobin A1C 8.2 (A) 4.0 - 5.6 %   HbA1c POC (<> result, manual entry)     HbA1c, POC (prediabetic range)     HbA1c, POC (controlled diabetic range)         Assessment & Plan    1. Type 2 diabetes mellitus without complication, unspecified whether long term insulin use (HCC)  - POCT HgB A1C--8.2 today.  2. Diverticulitis of large intestine without perforation or abscess without bleeding Recurrent problem--refer to surgery for evaluation. - ciprofloxacin (CIPRO) 500 MG tablet; Take 1 tablet (500 mg total) by mouth 2 (two) times daily.  Dispense: 14  tablet; Refill: 1 - metroNIDAZOLE (FLAGYL) 500 MG tablet; Take 1 tablet (500 mg total) by mouth 3 (three) times daily.  Dispense: 21 tablet; Refill: 1  3. Benign essential HTN Controlled On Telmisartan. - CBC w/Diff/Platelet - Comp. Metabolic Panel (12) - TSH - Lipid Profile  4. Other hyperlipidemia On crestor. - Comp. Metabolic Panel (12) - TSH - Lipid Profile  5. Obstructive sleep apnea syndrome Uses CPAP. - CBC w/Diff/Platelet - Comp. Metabolic Panel (12) - TSH - Lipid Profile     [name], MD  [facility]

## 2019-03-18 DIAGNOSIS — E7849 Other hyperlipidemia: Secondary | ICD-10-CM | POA: Diagnosis not present

## 2019-03-18 DIAGNOSIS — I10 Essential (primary) hypertension: Secondary | ICD-10-CM | POA: Diagnosis not present

## 2019-03-18 DIAGNOSIS — G4733 Obstructive sleep apnea (adult) (pediatric): Secondary | ICD-10-CM | POA: Diagnosis not present

## 2019-03-19 LAB — CBC WITH DIFFERENTIAL/PLATELET
Basophils Absolute: 0 10*3/uL (ref 0.0–0.2)
Basos: 1 %
EOS (ABSOLUTE): 0.2 10*3/uL (ref 0.0–0.4)
Eos: 3 %
Hematocrit: 47.1 % (ref 37.5–51.0)
Hemoglobin: 15.6 g/dL (ref 13.0–17.7)
Immature Grans (Abs): 0 10*3/uL (ref 0.0–0.1)
Immature Granulocytes: 0 %
Lymphocytes Absolute: 3.1 10*3/uL (ref 0.7–3.1)
Lymphs: 43 %
MCH: 32.5 pg (ref 26.6–33.0)
MCHC: 33.1 g/dL (ref 31.5–35.7)
MCV: 98 fL — ABNORMAL HIGH (ref 79–97)
Monocytes Absolute: 0.6 10*3/uL (ref 0.1–0.9)
Monocytes: 9 %
Neutrophils Absolute: 3.1 10*3/uL (ref 1.4–7.0)
Neutrophils: 44 %
Platelets: 163 10*3/uL (ref 150–450)
RBC: 4.8 x10E6/uL (ref 4.14–5.80)
RDW: 12.2 % (ref 11.6–15.4)
WBC: 7.1 10*3/uL (ref 3.4–10.8)

## 2019-03-19 LAB — COMP. METABOLIC PANEL (12)
AST: 42 IU/L — ABNORMAL HIGH (ref 0–40)
Albumin/Globulin Ratio: 1.5 (ref 1.2–2.2)
Albumin: 4.4 g/dL (ref 3.7–4.7)
Alkaline Phosphatase: 126 IU/L — ABNORMAL HIGH (ref 39–117)
BUN/Creatinine Ratio: 17 (ref 10–24)
BUN: 22 mg/dL (ref 8–27)
Bilirubin Total: 0.6 mg/dL (ref 0.0–1.2)
Calcium: 10.1 mg/dL (ref 8.6–10.2)
Chloride: 97 mmol/L (ref 96–106)
Creatinine, Ser: 1.31 mg/dL — ABNORMAL HIGH (ref 0.76–1.27)
GFR calc Af Amer: 62 mL/min/{1.73_m2} (ref >59)
GFR calc non Af Amer: 53 mL/min/{1.73_m2} — ABNORMAL LOW (ref >59)
Globulin, Total: 2.9 g/dL (ref 1.5–4.5)
Glucose: 186 mg/dL — ABNORMAL HIGH (ref 65–99)
Potassium: 4.4 mmol/L (ref 3.5–5.2)
Sodium: 137 mmol/L (ref 134–144)
Total Protein: 7.3 g/dL (ref 6.0–8.5)

## 2019-03-19 LAB — LIPID PANEL
Chol/HDL Ratio: 4.8 ratio (ref 0.0–5.0)
Cholesterol, Total: 133 mg/dL (ref 100–199)
HDL: 28 mg/dL — ABNORMAL LOW (ref >39)
LDL Chol Calc (NIH): 65 mg/dL (ref 0–99)
Triglycerides: 248 mg/dL — ABNORMAL HIGH (ref 0–149)
VLDL Cholesterol Cal: 40 mg/dL (ref 5–40)

## 2019-03-19 LAB — TSH: TSH: 3.05 u[IU]/mL (ref 0.450–4.500)

## 2019-03-20 ENCOUNTER — Telehealth: Payer: Self-pay

## 2019-03-20 NOTE — Telephone Encounter (Signed)
-----   Message from [name], MD sent at 03/20/2019  1:08 PM EDT ----- Lipids stable.  Mild decrease in kidney function, push fluids.

## 2019-03-20 NOTE — Telephone Encounter (Signed)
Patient notified of lab results

## 2019-04-01 ENCOUNTER — Ambulatory Visit (INDEPENDENT_AMBULATORY_CARE_PROVIDER_SITE_OTHER): Payer: PPO | Admitting: Family Medicine

## 2019-04-01 ENCOUNTER — Encounter: Payer: Self-pay | Admitting: Family Medicine

## 2019-04-01 ENCOUNTER — Other Ambulatory Visit: Payer: Self-pay

## 2019-04-01 VITALS — BP 144/76 | HR 90 | Temp 97.0°F | Resp 16 | Ht 73.0 in | Wt 224.0 lb

## 2019-04-01 DIAGNOSIS — M25512 Pain in left shoulder: Secondary | ICD-10-CM | POA: Diagnosis not present

## 2019-04-01 DIAGNOSIS — W19XXXA Unspecified fall, initial encounter: Secondary | ICD-10-CM | POA: Diagnosis not present

## 2019-04-01 DIAGNOSIS — G44319 Acute post-traumatic headache, not intractable: Secondary | ICD-10-CM

## 2019-04-01 NOTE — Progress Notes (Signed)
Patient: Manuel Stafford. Male    DOB: June 13, 1946   73 y.o.   MRN: [id number] Visit Date: 04/01/2019  Today's Provider: [name], MD   Chief Complaint  Patient presents with  . Fall  . Shoulder Pain    left shoulder   Subjective:   HPI  Patient comes in today c/o a fall. He reports that he was shoveling mulch off the bed of his truck quickly became off balance. He says his foot slipped on the mulch.  He reports that he fell and hit his left shoulder and the left side of his head. He did not lose consciousness. He has only taken Aleve for the pain. He did not go to the ER.   He has left shoulder pain and an occasional headache.   BP Readings from Last 3 Encounters:  04/01/19 (!) 144/76  03/17/19 120/73  12/08/18 138/75   Wt Readings from Last 3 Encounters:  04/01/19 224 lb (101.6 kg)  03/17/19 241 lb (109.3 kg)  12/08/18 239 lb (108.4 kg)     Allergies  Allergen Reactions  . Actos [Pioglitazone]     Muscle weakness     Current Outpatient Medications:  .  aspirin 81 MG tablet, Take 81 mg by mouth daily. , Disp: , Rfl:  .  cetirizine (ZYRTEC) 10 MG tablet, Take 1 tablet (10 mg total) by mouth daily., Disp: 30 tablet, Rfl: 11 .  Cholecalciferol (VITAMIN D3) 2000 UNITS capsule, Take 2,000 Units by mouth daily., Disp: , Rfl:  .  diphenhydrAMINE (BENADRYL ALLERGY) 25 mg capsule, Take by mouth. , Disp: , Rfl:  .  fluticasone (FLONASE) 50 MCG/ACT nasal spray, Place 2 sprays into both nostrils daily., Disp: 16 g, Rfl: 6 .  glucose blood test strip, 1 each by Other route 2 (two) times daily as needed for other. Use as instructed, Disp: 100 each, Rfl: 12 .  hydrochlorothiazide (HYDRODIURIL) 25 MG tablet, TAKE 1 TABLET (25 MG TOTAL) BY MOUTH DAILY., Disp: 90 tablet, Rfl: 3 .  HYDROcodone-acetaminophen (NORCO) 10-325 MG tablet, Take 1 tablet by mouth every 4 (four) hours as needed., Disp: 100 tablet, Rfl: 0 .  meclizine (ANTIVERT) 25 MG tablet, Take 2 tablets  (50 mg total) by mouth 3 (three) times daily as needed for dizziness., Disp: 60 tablet, Rfl: 1 .  meloxicam (MOBIC) 7.5 MG tablet, Take 1 tablet (7.5 mg total) by mouth 2 (two) times daily., Disp: 60 tablet, Rfl: 12 .  metFORMIN (GLUCOPHAGE) 1000 MG tablet, TAKE 1 TABLET (1,000 MG TOTAL) BY MOUTH 2 (TWO) TIMES DAILY WITH A MEAL., Disp: 180 tablet, Rfl: 3 .  MULTIPLE VITAMIN PO, Take by mouth daily. , Disp: , Rfl:  .  sildenafil (REVATIO) 20 MG tablet, Take 1-5 tablets by mouth daily as needed., Disp: 50 tablet, Rfl: 10 .  telmisartan (MICARDIS) 40 MG tablet, TAKE 1 TABLET BY MOUTH EVERY DAY, Disp: 90 tablet, Rfl: 3 .  tiZANidine (ZANAFLEX) 4 MG tablet, TAKE 1 TABLET (4 MG TOTAL) BY MOUTH AT BEDTIME., Disp: 90 tablet, Rfl: 1 .  traMADol (ULTRAM) 50 MG tablet, TAKE 1 TABLET BY MOUTH 3 TIMES A DAY AS NEEDED FOR BACK OR LEG PAIN, Disp: 180 tablet, Rfl: 3 .  ciprofloxacin (CIPRO) 500 MG tablet, Take 1 tablet (500 mg total) by mouth 2 (two) times daily. (Patient not taking: Reported on 04/01/2019), Disp: 14 tablet, Rfl: 1 .  metroNIDAZOLE (FLAGYL) 500 MG tablet, Take 1 tablet (500 mg  total) by mouth 3 (three) times daily. (Patient not taking: Reported on 03/10/2019), Disp: 21 tablet, Rfl: 1 .  metroNIDAZOLE (FLAGYL) 500 MG tablet, Take 1 tablet (500 mg total) by mouth 3 (three) times daily. (Patient not taking: Reported on 04/01/2019), Disp: 21 tablet, Rfl: 1 .  omeprazole (PRILOSEC) 20 MG capsule, TAKE 1 CAPSULE BY MOUTH EVERY DAY, Disp: 90 capsule, Rfl: 3 .  pioglitazone (ACTOS) 15 MG tablet, Take 1 tablet (15 mg total) by mouth daily. (Patient not taking: Reported on 03/17/2019), Disp: 90 tablet, Rfl: 3 .  predniSONE (DELTASONE) 10 MG tablet, Take 1 tablet (10 mg total) by mouth daily with breakfast. 12 day taper- 60mg  x 2 days, decrease by 10 mg every other day. (Patient not taking: Reported on 05/26/2018), Disp: 42 tablet, Rfl: 0 .  rosuvastatin (CRESTOR) 20 MG tablet, TAKE 1 TABLET (20 MG TOTAL) BY MOUTH  DAILY., Disp: 90 tablet, Rfl: 3 .  Saw Palmetto, Serenoa repens, (SAW PALMETTO BERRY) 160 MG CAPS, Take 2 tablets by mouth daily. , Disp: , Rfl:   Review of Systems  Constitutional: Negative for activity change and fatigue.  Respiratory: Negative for cough and shortness of breath.   Cardiovascular: Negative for chest pain, palpitations and leg swelling.  Musculoskeletal: Positive for arthralgias, back pain, myalgias, neck pain and neck stiffness.  Neurological: Positive for headaches. Negative for dizziness and light-headedness.  Psychiatric/Behavioral: Negative for agitation, self-injury, sleep disturbance and suicidal ideas.    Social History   Tobacco Use  . Smoking status: Former Smoker    Packs/day: 2.00    Years: 15.00    Pack years: 30.00    Types: Cigarettes    Quit date: 06/25/1974    Years since quitting: 44.7  . Smokeless tobacco: Never Used  Substance Use Topics  . Alcohol use: No      Objective:   BP (!) 144/76   Pulse 90   Temp (!) 97 F (36.1 C)   Resp 16   Ht 6\' 1"  (1.854 m)   Wt 224 lb (101.6 kg)   SpO2 97%   BMI 29.55 kg/m  Vitals:   04/01/19 1024  BP: (!) 144/76  Pulse: 90  Resp: 16  Temp: (!) 97 F (36.1 C)  SpO2: 97%  Weight: 224 lb (101.6 kg)  Height: 6\' 1"  (1.854 m)  Body mass index is 29.55 kg/m.   Physical Exam Vitals signs reviewed.  Constitutional:      Appearance: He is obese.  HENT:     Head: Normocephalic and atraumatic.     Right Ear: External ear normal.     Left Ear: External ear normal.  Eyes:     General: No scleral icterus.    Conjunctiva/sclera: Conjunctivae normal.  Cardiovascular:     Rate and Rhythm: Normal rate and regular rhythm.     Pulses: Normal pulses.     Heart sounds: Normal heart sounds.  Pulmonary:     Effort: Pulmonary effort is normal.     Breath sounds: Normal breath sounds.  Abdominal:     Palpations: Abdomen is soft.  Musculoskeletal:        General: Tenderness present. No swelling or  deformity.     Comments: Mild pain with abduction of left shoulder. Minimal point tenderness.FROM.  Skin:    General: Skin is warm and dry.  Neurological:     General: No focal deficit present.     Mental Status: He is alert and oriented to person, place, and time.  Psychiatric:        Mood and Affect: Mood normal.        Behavior: Behavior normal.        Thought Content: Thought content normal.        Judgment: Judgment normal.      No results found for any visits on 04/01/19.     Assessment & Plan    1. Fall, initial encounter There was no syncope.  Patient simply slipped slipped.  2. Acute pain of left shoulder No imaging necessary.  Try Aleve twice a day for 1 week.  Ice for 3 days then local heat.  3. Acute post-traumatic headache, not intractable Mild postconcussive headache.  This is already improving.  aleve as above.  No imaging necessary and patient with normal exam and story improving.     Richard Cranford Mon, MD  [facility]

## 2019-04-01 NOTE — Patient Instructions (Signed)
1. Aleve twice a day for 1 week. 2. Ice then heat starting tomorrow.

## 2019-04-03 ENCOUNTER — Telehealth: Payer: Self-pay | Admitting: Family Medicine

## 2019-04-03 DIAGNOSIS — Z1211 Encounter for screening for malignant neoplasm of colon: Secondary | ICD-10-CM

## 2019-04-03 NOTE — Telephone Encounter (Signed)
Order placed

## 2019-04-03 NOTE — Telephone Encounter (Signed)
Pt is requesting a referral for a colonoscopy.Dr Bary Castilla did his last one but is willing to see GI. He has history of diverticulitis

## 2019-04-03 NOTE — Telephone Encounter (Signed)
Dr Bary Castilla back seeing pts in his new location.

## 2019-04-03 NOTE — Telephone Encounter (Signed)
Is it okay to refer?  Thanks,   -Mickel Baas

## 2019-04-09 DIAGNOSIS — K5730 Diverticulosis of large intestine without perforation or abscess without bleeding: Secondary | ICD-10-CM | POA: Diagnosis not present

## 2019-04-20 DIAGNOSIS — E119 Type 2 diabetes mellitus without complications: Secondary | ICD-10-CM | POA: Diagnosis not present

## 2019-04-20 DIAGNOSIS — H5203 Hypermetropia, bilateral: Secondary | ICD-10-CM | POA: Diagnosis not present

## 2019-04-20 DIAGNOSIS — H52213 Irregular astigmatism, bilateral: Secondary | ICD-10-CM | POA: Diagnosis not present

## 2019-04-20 DIAGNOSIS — H2513 Age-related nuclear cataract, bilateral: Secondary | ICD-10-CM | POA: Diagnosis not present

## 2019-04-20 DIAGNOSIS — H524 Presbyopia: Secondary | ICD-10-CM | POA: Diagnosis not present

## 2019-04-20 LAB — HM DIABETES EYE EXAM

## 2019-04-21 NOTE — Progress Notes (Signed)
Patient: Manuel Stafford. Male    DOB: 05/14/1946   73 y.o.   MRN: [id number] Visit Date: 04/23/2019  Today's Provider: [name], MD   Chief Complaint  Patient presents with  . Diabetes   Subjective:     HPI   Type 2 diabetes mellitus without complication, unspecified whether long term insulin use ([place]) From 03/17/2019-HgB A1C--8.2. Patient reports good compliance with treatment and good tolerance. Home blood sugars average 170-200.  Patient needs to get A1c hopefully below 7 before Dr. Tollie Pizza will perform procedure.  Patient does not watch his diet very much and get some exercise.  Allergies  Allergen Reactions  . Actos [Pioglitazone]     Muscle weakness     Current Outpatient Medications:  .  aspirin 81 MG tablet, Take 81 mg by mouth daily. , Disp: , Rfl:  .  cetirizine (ZYRTEC) 10 MG tablet, Take 1 tablet (10 mg total) by mouth daily., Disp: 30 tablet, Rfl: 11 .  Cholecalciferol (VITAMIN D3) 2000 UNITS capsule, Take 2,000 Units by mouth daily., Disp: , Rfl:  .  diphenhydrAMINE (BENADRYL ALLERGY) 25 mg capsule, Take by mouth. , Disp: , Rfl:  .  fluticasone (FLONASE) 50 MCG/ACT nasal spray, Place 2 sprays into both nostrils daily., Disp: 16 g, Rfl: 6 .  hydrochlorothiazide (HYDRODIURIL) 25 MG tablet, TAKE 1 TABLET (25 MG TOTAL) BY MOUTH DAILY., Disp: 90 tablet, Rfl: 3 .  meclizine (ANTIVERT) 25 MG tablet, Take 2 tablets (50 mg total) by mouth 3 (three) times daily as needed for dizziness., Disp: 60 tablet, Rfl: 1 .  meloxicam (MOBIC) 7.5 MG tablet, Take 1 tablet (7.5 mg total) by mouth 2 (two) times daily., Disp: 60 tablet, Rfl: 12 .  metFORMIN (GLUCOPHAGE) 1000 MG tablet, TAKE 1 TABLET (1,000 MG TOTAL) BY MOUTH 2 (TWO) TIMES DAILY WITH A MEAL., Disp: 180 tablet, Rfl: 3 .  MULTIPLE VITAMIN PO, Take by mouth daily. , Disp: , Rfl:  .  omeprazole (PRILOSEC) 20 MG capsule, TAKE 1 CAPSULE BY MOUTH EVERY DAY, Disp: 90 capsule, Rfl: 3 .  rosuvastatin (CRESTOR)  20 MG tablet, TAKE 1 TABLET (20 MG TOTAL) BY MOUTH DAILY., Disp: 90 tablet, Rfl: 3 .  Saw Palmetto, Serenoa repens, (SAW PALMETTO BERRY) 160 MG CAPS, Take 2 tablets by mouth daily. , Disp: , Rfl:  .  sildenafil (REVATIO) 20 MG tablet, Take 1-5 tablets by mouth daily as needed., Disp: 50 tablet, Rfl: 10 .  telmisartan (MICARDIS) 40 MG tablet, TAKE 1 TABLET BY MOUTH EVERY DAY, Disp: 90 tablet, Rfl: 3 .  tiZANidine (ZANAFLEX) 4 MG tablet, TAKE 1 TABLET (4 MG TOTAL) BY MOUTH AT BEDTIME., Disp: 90 tablet, Rfl: 1 .  traMADol (ULTRAM) 50 MG tablet, TAKE 1 TABLET BY MOUTH 3 TIMES A DAY AS NEEDED FOR BACK OR LEG PAIN, Disp: 180 tablet, Rfl: 3 .  glucose blood test strip, 1 each by Other route daily. Use as instructed, Disp: 100 each, Rfl: 4 .  metroNIDAZOLE (FLAGYL) 500 MG tablet, Take 1 tablet (500 mg total) by mouth 3 (three) times daily. (Patient not taking: Reported on 03/10/2019), Disp: 21 tablet, Rfl: 1 .  metroNIDAZOLE (FLAGYL) 500 MG tablet, Take 1 tablet (500 mg total) by mouth 3 (three) times daily. (Patient not taking: Reported on 04/01/2019), Disp: 21 tablet, Rfl: 1 .  pioglitazone (ACTOS) 30 MG tablet, Take 1 tablet (30 mg total) by mouth daily., Disp: 30 tablet, Rfl: 1 .  Semaglutide,0.25 or  0.5MG /DOS, (OZEMPIC, 0.25 OR 0.5 MG/DOSE,) 2 MG/1.5ML SOPN, Inject 0.5 mg into the skin once a week., Disp: 1 pen, Rfl: 3  Review of Systems  Constitutional: Negative for appetite change, chills and fever.  Eyes: Negative.   Respiratory: Negative for chest tightness, shortness of breath and wheezing.   Cardiovascular: Negative for chest pain and palpitations.  Gastrointestinal: Negative for abdominal pain, nausea and vomiting.  Endocrine: Negative.   Genitourinary: Negative.   Musculoskeletal: Positive for arthralgias.  Allergic/Immunologic: Negative.   Hematological: Negative.   Psychiatric/Behavioral: Negative.     Social History   Tobacco Use  . Smoking status: Former Smoker    Packs/day: 2.00     Years: 15.00    Pack years: 30.00    Types: Cigarettes    Quit date: 06/25/1974    Years since quitting: 44.8  . Smokeless tobacco: Never Used  Substance Use Topics  . Alcohol use: No      Objective:   BP 112/62 (BP Location: Left Arm, Patient Position: Sitting, Cuff Size: Large)   Pulse (!) 103   Temp 97.7 F (36.5 C) (Temporal)   Resp 18   Wt 236 lb (107 kg)   SpO2 96% Comment: room air  BMI 31.14 kg/m  Vitals:   04/23/19 1130  BP: 112/62  Pulse: (!) 103  Resp: 18  Temp: 97.7 F (36.5 C)  TempSrc: Temporal  SpO2: 96%  Weight: 236 lb (107 kg)  Body mass index is 31.14 kg/m.   Physical Exam Vitals signs reviewed.  Constitutional:      Appearance: He is obese.  HENT:     Head: Normocephalic and atraumatic.     Right Ear: External ear normal.     Left Ear: External ear normal.  Eyes:     General: No scleral icterus.    Conjunctiva/sclera: Conjunctivae normal.  Cardiovascular:     Rate and Rhythm: Normal rate and regular rhythm.     Pulses: Normal pulses.     Heart sounds: Normal heart sounds.  Pulmonary:     Effort: Pulmonary effort is normal.     Breath sounds: Normal breath sounds.  Abdominal:     Palpations: Abdomen is soft.  Skin:    General: Skin is warm and dry.  Neurological:     General: No focal deficit present.     Mental Status: He is alert and oriented to person, place, and time.  Psychiatric:        Mood and Affect: Mood normal.        Behavior: Behavior normal.        Thought Content: Thought content normal.        Judgment: Judgment normal.      No results found for any visits on 04/23/19.     Assessment & Plan    1. Type 2 diabetes mellitus without complication, unspecified whether long term insulin use (HCC) Will increase Actos from 15mg  to 30mg  daily. Will add Ozempic 0.5mg  weekly. Return in 1 month for follow up.  Patient also to start checking fasting blood sugar daily - Semaglutide,0.25 or 0.5MG /DOS, (OZEMPIC, 0.25 OR  0.5 MG/DOSE,) 2 MG/1.5ML SOPN; Inject 0.5 mg into the skin once a week.  Dispense: 1 pen; Refill: 3 - pioglitazone (ACTOS) 30 MG tablet; Take 1 tablet (30 mg total) by mouth daily.  Dispense: 30 tablet; Refill: 1 - glucose blood test strip; 1 each by Other route daily. Use as instructed  Dispense: 100 each; Refill: 4  2. Diverticulitis of large intestine without perforation or abscess without bleeding Followed by Dr. Bary Castilla  3. Obstructive sleep apnea syndrome On CPAP  4. Class 1 obesity due to excess calories with serious comorbidity and body mass index (BMI) of 31.0 to 31.9 in adult Lifestyle discussed   [name], MD  [facility]

## 2019-04-23 ENCOUNTER — Other Ambulatory Visit: Payer: Self-pay

## 2019-04-23 ENCOUNTER — Ambulatory Visit (INDEPENDENT_AMBULATORY_CARE_PROVIDER_SITE_OTHER): Payer: PPO | Admitting: Family Medicine

## 2019-04-23 ENCOUNTER — Encounter: Payer: Self-pay | Admitting: Family Medicine

## 2019-04-23 VITALS — BP 112/62 | HR 103 | Temp 97.7°F | Resp 18 | Wt 236.0 lb

## 2019-04-23 DIAGNOSIS — E119 Type 2 diabetes mellitus without complications: Secondary | ICD-10-CM

## 2019-04-23 DIAGNOSIS — Z6831 Body mass index (BMI) 31.0-31.9, adult: Secondary | ICD-10-CM | POA: Diagnosis not present

## 2019-04-23 DIAGNOSIS — K5732 Diverticulitis of large intestine without perforation or abscess without bleeding: Secondary | ICD-10-CM

## 2019-04-23 DIAGNOSIS — E6609 Other obesity due to excess calories: Secondary | ICD-10-CM

## 2019-04-23 DIAGNOSIS — G4733 Obstructive sleep apnea (adult) (pediatric): Secondary | ICD-10-CM

## 2019-04-23 MED ORDER — OZEMPIC (0.25 OR 0.5 MG/DOSE) 2 MG/1.5ML ~~LOC~~ SOPN: 0.5000 mg | PEN_INJECTOR | SUBCUTANEOUS | 3 refills | Status: DC

## 2019-04-23 MED ORDER — GLUCOSE BLOOD VI STRP: 1.0000 | ORAL_STRIP | Freq: Every day | 4 refills | Status: DC

## 2019-04-23 MED ORDER — PIOGLITAZONE HCL 30 MG PO TABS: 30.0000 mg | ORAL_TABLET | Freq: Every day | ORAL | 1 refills | Status: DC

## 2019-05-05 DIAGNOSIS — M5416 Radiculopathy, lumbar region: Secondary | ICD-10-CM | POA: Diagnosis not present

## 2019-05-05 DIAGNOSIS — M5136 Other intervertebral disc degeneration, lumbar region: Secondary | ICD-10-CM | POA: Diagnosis not present

## 2019-05-13 ENCOUNTER — Other Ambulatory Visit: Payer: Self-pay | Admitting: Family Medicine

## 2019-05-13 DIAGNOSIS — J309 Allergic rhinitis, unspecified: Secondary | ICD-10-CM

## 2019-05-15 ENCOUNTER — Other Ambulatory Visit: Payer: Self-pay | Admitting: Family Medicine

## 2019-05-15 DIAGNOSIS — E119 Type 2 diabetes mellitus without complications: Secondary | ICD-10-CM

## 2019-05-19 NOTE — Progress Notes (Signed)
Patient: Manuel Stafford. Male    DOB: [birth year]   73 y.o.   MRN: [id number] Visit Date: 05/25/2019  Today's Provider: [name], MD   Chief Complaint  Patient presents with  . Follow-up  . Diabetes   Subjective:     Diabetes He presents for his follow-up diabetic visit. His disease course has been improving. There are no hypoglycemic associated symptoms. Pertinent negatives for diabetes include no blurred vision, no chest pain, no fatigue, no foot paresthesias, no foot ulcerations, no polydipsia, no polyphagia, no polyuria, no visual change and no weakness. There are no hypoglycemic complications. Symptoms are stable. Current diabetic treatment includes insulin injections. He is compliant with treatment all of the time. His weight is stable. There is no change in his home blood glucose trend. His breakfast blood glucose is taken between 6-7 am. His breakfast blood glucose range is generally 130-140 mg/dl. His overall blood glucose range is 110-130 mg/dl.     Type 2 diabetes mellitus without complication, unspecified whether long term insulin use ([place]) From 04/23/2019-increased Actos from 15mg  to 30mg  daily. Added Ozempic 0.5mg  weekly. Return in 1 month for follow up.  Patient also to start checking fasting blood sugar daily.  Ozempic is worked well for the patient.  Fasting blood sugars have dropped from about 200 to about 120 to 130.  Class 1 obesity due to excess calories with serious comorbidity and body mass index (BMI) of 31.0 to 31.9 in adult From 04/23/2019-Lifestyle discussed.  The other issue causing him issues today is chronic back pain.  He sees Dr. Water quality scientist for this.  Last shot did not help.  Has an appointment with him in a week.  Pain is in the left lower back, it is all the time  Allergies  Allergen Reactions  . Actos [Pioglitazone]     Muscle weakness     Current Outpatient Medications:  .  aspirin 81 MG tablet, Take 81 mg by mouth daily. ,  Disp: , Rfl:  .  cetirizine (ZYRTEC) 10 MG tablet, TAKE 1 TABLET BY MOUTH EVERY DAY, Disp: 30 tablet, Rfl: 3 .  Cholecalciferol (VITAMIN D3) 2000 UNITS capsule, Take 2,000 Units by mouth daily., Disp: , Rfl:  .  diphenhydrAMINE (BENADRYL ALLERGY) 25 mg capsule, Take by mouth. , Disp: , Rfl:  .  fluticasone (FLONASE) 50 MCG/ACT nasal spray, Place 2 sprays into both nostrils daily., Disp: 16 g, Rfl: 6 .  glucose blood test strip, 1 each by Other route daily. Use as instructed, Disp: 100 each, Rfl: 4 .  hydrochlorothiazide (HYDRODIURIL) 25 MG tablet, TAKE 1 TABLET (25 MG TOTAL) BY MOUTH DAILY., Disp: 90 tablet, Rfl: 3 .  meclizine (ANTIVERT) 25 MG tablet, Take 2 tablets (50 mg total) by mouth 3 (three) times daily as needed for dizziness., Disp: 60 tablet, Rfl: 1 .  meloxicam (MOBIC) 7.5 MG tablet, Take 1 tablet (7.5 mg total) by mouth 2 (two) times daily., Disp: 60 tablet, Rfl: 12 .  metFORMIN (GLUCOPHAGE) 1000 MG tablet, TAKE 1 TABLET (1,000 MG TOTAL) BY MOUTH 2 (TWO) TIMES DAILY WITH A MEAL., Disp: 180 tablet, Rfl: 3 .  MULTIPLE VITAMIN PO, Take by mouth daily. , Disp: , Rfl:  .  omeprazole (PRILOSEC) 20 MG capsule, TAKE 1 CAPSULE BY MOUTH EVERY DAY, Disp: 90 capsule, Rfl: 3 .  pioglitazone (ACTOS) 30 MG tablet, TAKE 1 TABLET BY MOUTH EVERY DAY, Disp: 30 tablet, Rfl: 1 .  rosuvastatin (CRESTOR) 20  MG tablet, TAKE 1 TABLET (20 MG TOTAL) BY MOUTH DAILY., Disp: 90 tablet, Rfl: 3 .  Saw Palmetto, Serenoa repens, (SAW PALMETTO BERRY) 160 MG CAPS, Take 2 tablets by mouth daily. , Disp: , Rfl:  .  Semaglutide,0.25 or 0.5MG /DOS, (OZEMPIC, 0.25 OR 0.5 MG/DOSE,) 2 MG/1.5ML SOPN, Inject 0.5 mg into the skin once a week., Disp: 1 pen, Rfl: 3 .  sildenafil (REVATIO) 20 MG tablet, Take 1-5 tablets by mouth daily as needed., Disp: 50 tablet, Rfl: 10 .  telmisartan (MICARDIS) 40 MG tablet, TAKE 1 TABLET BY MOUTH EVERY DAY, Disp: 90 tablet, Rfl: 3 .  tiZANidine (ZANAFLEX) 4 MG tablet, TAKE 1 TABLET (4 MG TOTAL) BY  MOUTH AT BEDTIME., Disp: 90 tablet, Rfl: 1 .  traMADol (ULTRAM) 50 MG tablet, TAKE 1 TABLET BY MOUTH 3 TIMES A DAY AS NEEDED FOR BACK OR LEG PAIN, Disp: 180 tablet, Rfl: 3  Review of Systems  Constitutional: Negative for appetite change, chills, fatigue and fever.  HENT: Negative.   Eyes: Negative.  Negative for blurred vision.  Respiratory: Negative for chest tightness, shortness of breath and wheezing.   Cardiovascular: Negative for chest pain and palpitations.  Gastrointestinal: Negative for abdominal pain, nausea and vomiting.  Endocrine: Negative for polydipsia, polyphagia and polyuria.  Genitourinary: Negative.   Musculoskeletal: Positive for back pain.  Neurological: Negative for weakness.  Psychiatric/Behavioral: Negative.     Social History   Tobacco Use  . Smoking status: Former Smoker    Packs/day: 2.00    Years: 15.00    Pack years: 30.00    Types: Cigarettes    Quit date: 06/25/1974    Years since quitting: 44.9  . Smokeless tobacco: Never Used  Substance Use Topics  . Alcohol use: No      Objective:   BP 128/70   Pulse 96   Temp (!) 97.5 F (36.4 C) (Temporal)   Resp 18   Ht 6' (1.829 m)   Wt 236 lb 12.8 oz (107.4 kg)   SpO2 97%   BMI 32.12 kg/m  Vitals:   05/25/19 1125  BP: 128/70  Pulse: 96  Resp: 18  Temp: (!) 97.5 F (36.4 C)  TempSrc: Temporal  SpO2: 97%  Weight: 236 lb 12.8 oz (107.4 kg)  Height: 6' (1.829 m)  Body mass index is 32.12 kg/m.   Physical Exam Vitals signs reviewed.  Constitutional:      Appearance: He is obese.  HENT:     Head: Normocephalic and atraumatic.     Right Ear: External ear normal.     Left Ear: External ear normal.  Eyes:     General: No scleral icterus.    Conjunctiva/sclera: Conjunctivae normal.  Cardiovascular:     Rate and Rhythm: Normal rate and regular rhythm.     Pulses: Normal pulses.     Heart sounds: Normal heart sounds.  Pulmonary:     Effort: Pulmonary effort is normal.     Breath  sounds: Normal breath sounds.  Abdominal:     Palpations: Abdomen is soft.  Musculoskeletal:     Comments: Mild tenderness over region of left SI joint  Skin:    General: Skin is warm and dry.  Neurological:     General: No focal deficit present.     Mental Status: He is alert and oriented to person, place, and time.  Psychiatric:        Mood and Affect: Mood normal.        Behavior: Behavior  normal.        Thought Content: Thought content normal.        Judgment: Judgment normal.      No results found for any visits on 05/25/19.     Assessment & Plan     1. Type 2 diabetes mellitus without complication, unspecified whether long term insulin use (HCC) Now on next Ozempic dose of 0.5 weekly.  We will see him back in mid January for A1c.  2. Class 1 obesity due to excess calories with serious comorbidity and body mass index (BMI) of 31.0 to 31.9 in adult Lifestyle stressed  3. Obstructive sleep apnea syndrome On CPAP  4. Lumbosacral spondylosis without myelopathy At this time will try Lidoderm patch.  We will see if he gets any benefit from this.  We will see Dr. Santa Lighter in a week - lidocaine (LIDODERM) 5 %; Place 1 patch onto the skin daily. Remove & Discard patch within 12 hours or as directed by MD  Dispense: 30 patch; Refill: 5  5. DDD (degenerative disc disease), lumbar   6. Diverticulosis With history of significant diverticulitis.      Richard Cranford Mon, MD  [facility]

## 2019-05-25 ENCOUNTER — Ambulatory Visit (INDEPENDENT_AMBULATORY_CARE_PROVIDER_SITE_OTHER): Payer: PPO | Admitting: Family Medicine

## 2019-05-25 ENCOUNTER — Other Ambulatory Visit: Payer: Self-pay

## 2019-05-25 ENCOUNTER — Encounter: Payer: Self-pay | Admitting: Family Medicine

## 2019-05-25 VITALS — BP 128/70 | HR 96 | Temp 97.5°F | Resp 18 | Ht 72.0 in | Wt 236.8 lb

## 2019-05-25 DIAGNOSIS — Z6831 Body mass index (BMI) 31.0-31.9, adult: Secondary | ICD-10-CM

## 2019-05-25 DIAGNOSIS — E119 Type 2 diabetes mellitus without complications: Secondary | ICD-10-CM

## 2019-05-25 DIAGNOSIS — K5790 Diverticulosis of intestine, part unspecified, without perforation or abscess without bleeding: Secondary | ICD-10-CM | POA: Diagnosis not present

## 2019-05-25 DIAGNOSIS — M5136 Other intervertebral disc degeneration, lumbar region: Secondary | ICD-10-CM

## 2019-05-25 DIAGNOSIS — G4733 Obstructive sleep apnea (adult) (pediatric): Secondary | ICD-10-CM

## 2019-05-25 DIAGNOSIS — M47817 Spondylosis without myelopathy or radiculopathy, lumbosacral region: Secondary | ICD-10-CM

## 2019-05-25 DIAGNOSIS — E6609 Other obesity due to excess calories: Secondary | ICD-10-CM | POA: Diagnosis not present

## 2019-05-25 MED ORDER — LIDOCAINE 5 % EX PTCH: 1.0000 | MEDICATED_PATCH | CUTANEOUS | 5 refills | Status: DC

## 2019-06-12 ENCOUNTER — Other Ambulatory Visit (HOSPITAL_COMMUNITY): Payer: Self-pay | Admitting: Physical Medicine and Rehabilitation

## 2019-06-12 ENCOUNTER — Other Ambulatory Visit: Payer: Self-pay | Admitting: Physical Medicine and Rehabilitation

## 2019-06-12 DIAGNOSIS — M5416 Radiculopathy, lumbar region: Secondary | ICD-10-CM

## 2019-06-12 DIAGNOSIS — M5136 Other intervertebral disc degeneration, lumbar region: Secondary | ICD-10-CM | POA: Diagnosis not present

## 2019-06-13 ENCOUNTER — Other Ambulatory Visit: Payer: Self-pay | Admitting: Family Medicine

## 2019-06-13 DIAGNOSIS — E119 Type 2 diabetes mellitus without complications: Secondary | ICD-10-CM

## 2019-06-16 ENCOUNTER — Other Ambulatory Visit: Payer: Self-pay | Admitting: Family Medicine

## 2019-06-16 DIAGNOSIS — E119 Type 2 diabetes mellitus without complications: Secondary | ICD-10-CM

## 2019-06-16 NOTE — Telephone Encounter (Signed)
Requested medication (s) are due for refill today: yes  Requested medication (s) are on the active medication list: yes  Last refill:  05/25/2019  Future visit scheduled: yes  Notes to clinic:  looks like patient has 2 different strengths Please review and see if this is correct   Requested Prescriptions  Pending Prescriptions Disp Refills   pioglitazone (ACTOS) 30 MG tablet [Pharmacy Med Name: PIOGLITAZONE HCL 30 MG TABLET] 30 tablet 1    Sig: TAKE 1 TABLET BY [place]      Endocrinology:  Diabetes - Glitazones - pioglitazone Failed - 06/16/2019  2:30 PM      Failed - HBA1C is between 0 and 7.9 and within 180 days    Hemoglobin A1C  Date Value Ref Range Status  03/17/2019 8.2 (A) 4.0 - 5.6 % Final   Hgb A1c MFr Bld  Date Value Ref Range Status  10/14/2017 8.6 (H) 4.8 - 5.6 % Final    Comment:             Prediabetes: 5.7 - 6.4          Diabetes: >6.4          Glycemic control for adults with diabetes: <7.0           Passed - Valid encounter within last 6 months    Recent Outpatient Visits           3 weeks ago Type 2 diabetes mellitus without complication, unspecified whether long term insulin use ([place])   [facility] [name], MD   1 month ago Type 2 diabetes mellitus without complication, unspecified whether long term insulin use [facility])   [facility] [name], MD   2 months ago Fall, initial encounter   [facility] Rosanna Randy, Retia Passe., MD   3 months ago Type 2 diabetes mellitus without complication, unspecified whether long term insulin use [facility])   [facility] [name], MD   6 months ago Diverticulitis of large intestine without perforation or abscess without bleeding   [facility] [name], MD       Future Appointments             In 4 weeks [name], MD [facility] [place]

## 2019-06-17 ENCOUNTER — Telehealth: Payer: Self-pay

## 2019-06-17 NOTE — Telephone Encounter (Signed)
After speaking with patent and reviewing chart it was advised to patient to continue 30mg  . There is no documentation or recent lab that would explain why this was cutt down. Patient advised to call office back on Monday for further clarification.

## 2019-06-17 NOTE — Telephone Encounter (Signed)
I am not sure. He refilled Actos 30mg  on 05/15/19 at [facility], but then actos 15mg  was send in again on 06/13/19.   Can take 2 of the 15mg  if he picked those up and can be addressed by Dr. Darnell Level on Monday.

## 2019-06-17 NOTE — Telephone Encounter (Signed)
Copied from [place] [id number]. Topic: Quick Communication - Rx Refill/Question >> Jun 17, 2019  3:19 PM Erick Blinks wrote: Pt called requesting a call back from clinical staff regarding medication clarification. Please advise Best contact: [phone]

## 2019-06-18 NOTE — Telephone Encounter (Signed)
Called and spoke with the patient about his medication. He stated that he has a few of the 30mg  tablets left of his Actos but he has a years worth prescription of the 15mg  now. He was advised to finish the 30mg  pills then start taking 2 15mg  to equal the 30mg  dose and discuss that with Dr.Gilbert at his appointment 07/15/2019. He gave verbal understanding.

## 2019-06-25 ENCOUNTER — Other Ambulatory Visit: Payer: Self-pay

## 2019-06-25 ENCOUNTER — Ambulatory Visit
Admission: RE | Admit: 2019-06-25 | Discharge: 2019-06-25 | Disposition: A | Discharge status: Home / Self Care | Payer: PPO | Source: Ambulatory Visit | Attending: Physical Medicine and Rehabilitation | Admitting: Physical Medicine and Rehabilitation

## 2019-06-25 DIAGNOSIS — M545 Low back pain: Secondary | ICD-10-CM | POA: Diagnosis not present

## 2019-06-25 DIAGNOSIS — M5416 Radiculopathy, lumbar region: Secondary | ICD-10-CM | POA: Diagnosis not present

## 2019-06-29 DIAGNOSIS — M5126 Other intervertebral disc displacement, lumbar region: Secondary | ICD-10-CM | POA: Diagnosis not present

## 2019-06-29 DIAGNOSIS — M5416 Radiculopathy, lumbar region: Secondary | ICD-10-CM | POA: Diagnosis not present

## 2019-07-07 ENCOUNTER — Other Ambulatory Visit: Payer: Self-pay | Admitting: Family Medicine

## 2019-07-07 DIAGNOSIS — M5136 Other intervertebral disc degeneration, lumbar region: Secondary | ICD-10-CM

## 2019-07-07 NOTE — Telephone Encounter (Signed)
Requested medication (s) are due for refill today: yes  Requested medication (s) are on the active medication list: yes  Last refill:  04/04/2019  Future visit scheduled:yes  Notes to clinic:    This refill cannot be delegated     Requested Prescriptions  Pending Prescriptions Disp Refills   tiZANidine (ZANAFLEX) 4 MG tablet [Pharmacy Med Name: TIZANIDINE HCL 4 MG TABLET] 90 tablet 1    Sig: TAKE 1 TABLET (4 MG TOTAL) BY MOUTH AT BEDTIME.      Not Delegated - Cardiovascular:  Alpha-2 Agonists - tizanidine Failed - 07/07/2019  1:27 AM      Failed - This refill cannot be delegated      Passed - Valid encounter within last 6 months    Recent Outpatient Visits           1 month ago Type 2 diabetes mellitus without complication, unspecified whether long term insulin use [facility])   [facility] [name], MD   2 months ago Type 2 diabetes mellitus without complication, unspecified whether long term insulin use [facility])   [facility] [name], MD   3 months ago Fall, initial encounter   [facility] Rosanna Randy, Retia Passe., MD   3 months ago Type 2 diabetes mellitus without complication, unspecified whether long term insulin use [facility])   [facility] [name], MD   7 months ago Diverticulitis of large intestine without perforation or abscess without bleeding   [facility] [name], MD       Future Appointments             In 1 week [name], MD [facility]

## 2019-07-08 ENCOUNTER — Other Ambulatory Visit: Payer: Self-pay

## 2019-07-08 ENCOUNTER — Other Ambulatory Visit: Payer: Self-pay | Admitting: Family Medicine

## 2019-07-08 NOTE — Telephone Encounter (Signed)
Patient has upcoming appointment-07/15/19. Refill granted with no additional

## 2019-07-09 ENCOUNTER — Other Ambulatory Visit: Payer: Self-pay

## 2019-07-09 ENCOUNTER — Other Ambulatory Visit: Payer: Self-pay | Admitting: Family Medicine

## 2019-07-09 DIAGNOSIS — E119 Type 2 diabetes mellitus without complications: Secondary | ICD-10-CM

## 2019-07-10 DIAGNOSIS — M5442 Lumbago with sciatica, left side: Secondary | ICD-10-CM | POA: Diagnosis not present

## 2019-07-10 DIAGNOSIS — M5416 Radiculopathy, lumbar region: Secondary | ICD-10-CM | POA: Diagnosis not present

## 2019-07-10 DIAGNOSIS — M5126 Other intervertebral disc displacement, lumbar region: Secondary | ICD-10-CM | POA: Diagnosis not present

## 2019-07-10 DIAGNOSIS — M5136 Other intervertebral disc degeneration, lumbar region: Secondary | ICD-10-CM | POA: Diagnosis not present

## 2019-07-13 ENCOUNTER — Other Ambulatory Visit: Payer: Self-pay

## 2019-07-13 ENCOUNTER — Encounter: Payer: Self-pay | Admitting: Family Medicine

## 2019-07-13 ENCOUNTER — Ambulatory Visit (INDEPENDENT_AMBULATORY_CARE_PROVIDER_SITE_OTHER): Payer: PPO | Admitting: Family Medicine

## 2019-07-13 VITALS — BP 118/71 | HR 104 | Temp 96.6°F | Wt 234.0 lb

## 2019-07-13 DIAGNOSIS — E119 Type 2 diabetes mellitus without complications: Secondary | ICD-10-CM | POA: Diagnosis not present

## 2019-07-13 DIAGNOSIS — E1169 Type 2 diabetes mellitus with other specified complication: Secondary | ICD-10-CM

## 2019-07-13 DIAGNOSIS — Z791 Long term (current) use of non-steroidal anti-inflammatories (NSAID): Secondary | ICD-10-CM | POA: Diagnosis not present

## 2019-07-13 LAB — POCT GLYCOSYLATED HEMOGLOBIN (HGB A1C)
Estimated Average Glucose: 146
Hemoglobin A1C: 6.7 % — AB (ref 4.0–5.6)

## 2019-07-13 MED ORDER — OZEMPIC (1 MG/DOSE) 2 MG/1.5ML ~~LOC~~ SOPN: 1.0000 mg | PEN_INJECTOR | SUBCUTANEOUS | 3 refills | Status: DC

## 2019-07-13 NOTE — Patient Instructions (Signed)
Stay on Metformin Decrease Actos to 15 mg daily Increase Ozempic slowly to 1mg  weekly

## 2019-07-13 NOTE — Assessment & Plan Note (Signed)
Discussed risks of long-term use of NSAIDs Advised for as needed use only As his shoulder pain has nearly resolved, he can try off of meloxicam and see how he does

## 2019-07-13 NOTE — Progress Notes (Signed)
Patient: Manuel Stafford. Male    DOB: 12-29-1945   74 y.o.   MRN: [id number] Visit Date: 07/13/2019  Today's Provider: Lavon Paganini, MD   Chief Complaint  Patient presents with  . Follow-up   Subjective:     HPI   Pt wanted to discuss with doctor Meloxicam medication.  He got the impression that PCP was not a fan.  He is taking this for chronic shoulder pain and it seems to help.  He wonders if he should stop taking this.  Shoulder pain is minimal at this time   Pt says that Ozempic medication is working well for him, no side effects. Actos was decreased to 15mg  after last visit, but he wasn't sure why.  He has continued 30mg  daily. UTD on eye exam. [employer] has broken with glucometer and wondering if we have a sample that he could use with his current meter which is working fine.  He has fairly compliant with a low-carb diet.  He knows that he needs to get his blood sugar down in order to have surgery.  Allergies  Allergen Reactions  . Actos [Pioglitazone]     Muscle weakness     Current Outpatient Medications:  .  aspirin 81 MG tablet, Take 81 mg by mouth daily. , Disp: , Rfl:  .  cetirizine (ZYRTEC) 10 MG tablet, TAKE 1 TABLET BY MOUTH EVERY DAY, Disp: 30 tablet, Rfl: 3 .  Cholecalciferol (VITAMIN D3) 2000 UNITS capsule, Take 2,000 Units by mouth daily., Disp: , Rfl:  .  diphenhydrAMINE (BENADRYL ALLERGY) 25 mg capsule, Take by mouth. , Disp: , Rfl:  .  glucose blood test strip, 1 each by Other route daily. Use as instructed, Disp: 100 each, Rfl: 4 .  hydrochlorothiazide (HYDRODIURIL) 25 MG tablet, TAKE 1 TABLET BY MOUTH EVERY DAY, Disp: 90 tablet, Rfl: 0 .  lidocaine (LIDODERM) 5 %, Place 1 patch onto the skin daily. Remove & Discard patch within 12 hours or as directed by MD, Disp: 30 patch, Rfl: 5 .  meloxicam (MOBIC) 7.5 MG tablet, Take 1 tablet (7.5 mg total) by mouth 2 (two) times daily., Disp: 60 tablet, Rfl: 12 .  metFORMIN (GLUCOPHAGE) 1000 MG tablet,  TAKE 1 TABLET (1,000 MG TOTAL) BY MOUTH 2 (TWO) TIMES DAILY WITH A MEAL., Disp: 180 tablet, Rfl: 0 .  MULTIPLE VITAMIN PO, Take by mouth daily. , Disp: , Rfl:  .  omeprazole (PRILOSEC) 20 MG capsule, TAKE 1 CAPSULE BY MOUTH EVERY DAY, Disp: 90 capsule, Rfl: 3 .  OZEMPIC, 0.25 OR 0.5 MG/DOSE, 2 MG/1.5ML SOPN, INJECT 0.5 MG INTO THE SKIN ONCE A WEEK., Disp: 4.5 mL, Rfl: 0 .  pioglitazone (ACTOS) 15 MG tablet, TAKE 1 TABLET BY MOUTH EVERY DAY, Disp: 90 tablet, Rfl: 3 .  rosuvastatin (CRESTOR) 20 MG tablet, TAKE 1 TABLET (20 MG TOTAL) BY MOUTH DAILY., Disp: 90 tablet, Rfl: 3 .  Saw Palmetto, Serenoa repens, (SAW PALMETTO BERRY) 160 MG CAPS, Take 2 tablets by mouth daily. , Disp: , Rfl:  .  sildenafil (REVATIO) 20 MG tablet, Take 1-5 tablets by mouth daily as needed., Disp: 50 tablet, Rfl: 10 .  telmisartan (MICARDIS) 40 MG tablet, TAKE 1 TABLET BY MOUTH EVERY DAY, Disp: 90 tablet, Rfl: 3 .  tiZANidine (ZANAFLEX) 4 MG tablet, TAKE 1 TABLET (4 MG TOTAL) BY MOUTH AT BEDTIME., Disp: 90 tablet, Rfl: 1 .  traMADol (ULTRAM) 50 MG tablet, TAKE 1 TABLET BY MOUTH 3  TIMES A DAY AS NEEDED FOR BACK OR LEG PAIN, Disp: 180 tablet, Rfl: 3 .  fluticasone (FLONASE) 50 MCG/ACT nasal spray, Place 2 sprays into both nostrils daily. (Patient not taking: Reported on 07/13/2019), Disp: 16 g, Rfl: 6 .  meclizine (ANTIVERT) 25 MG tablet, Take 2 tablets (50 mg total) by mouth 3 (three) times daily as needed for dizziness. (Patient not taking: Reported on 07/13/2019), Disp: 60 tablet, Rfl: 1  Review of Systems  Constitutional: Negative.   HENT: Negative.   Respiratory: Negative.   Cardiovascular: Negative.   Endocrine: Negative.   Neurological: Negative.     Social History   Tobacco Use  . Smoking status: Former Smoker    Packs/day: 2.00    Years: 15.00    Pack years: 30.00    Types: Cigarettes    Quit date: 06/25/1974    Years since quitting: 45.0  . Smokeless tobacco: Never Used  Substance Use Topics  . Alcohol  use: No      Objective:   BP 118/71 (BP Location: Left Arm, Patient Position: Sitting, Cuff Size: Large)   Pulse (!) 104   Temp (!) 96.6 F (35.9 C) (Temporal)   Wt 234 lb (106.1 kg)   BMI 31.74 kg/m  Vitals:   07/13/19 1334  BP: 118/71  Pulse: (!) 104  Temp: (!) 96.6 F (35.9 C)  TempSrc: Temporal  Weight: 234 lb (106.1 kg)  Body mass index is 31.74 kg/m.   Physical Exam Vitals reviewed.  Constitutional:      General: He is not in acute distress.    Appearance: Normal appearance. He is not diaphoretic.  HENT:     Head: Normocephalic and atraumatic.  Eyes:     General: No scleral icterus.    Conjunctiva/sclera: Conjunctivae normal.  Cardiovascular:     Rate and Rhythm: Normal rate and regular rhythm.     Pulses: Normal pulses.     Heart sounds: Normal heart sounds. No murmur.  Pulmonary:     Effort: Pulmonary effort is normal. No respiratory distress.     Breath sounds: Normal breath sounds. No wheezing or rhonchi.  Abdominal:     General: There is no distension.     Palpations: Abdomen is soft.     Tenderness: There is no abdominal tenderness.  Musculoskeletal:     Cervical back: Neck supple.     Right lower leg: No edema.     Left lower leg: No edema.  Lymphadenopathy:     Cervical: No cervical adenopathy.  Skin:    General: Skin is warm and dry.     Capillary Refill: Capillary refill takes less than 2 seconds.     Findings: No rash.  Neurological:     Mental Status: He is alert and oriented to person, place, and time.     Cranial Nerves: No cranial nerve deficit.  Psychiatric:        Mood and Affect: Mood normal.        Behavior: Behavior normal.     Diabetic Foot Exam - Simple   Simple Foot Form Diabetic Foot exam was performed with the following findings: Yes 07/13/2019  2:19 PM  Visual Inspection No deformities, no ulcerations, no other skin breakdown bilaterally: Yes Sensation Testing Intact to touch and monofilament testing bilaterally:  Yes Pulse Check Posterior Tibialis and Dorsalis pulse intact bilaterally: Yes Comments      Results for orders placed or performed in visit on 07/13/19  POCT HgB A1C  Result Value  Ref Range   Hemoglobin A1C 6.7 (A) 4.0 - 5.6 %   HbA1c POC (<> result, manual entry)     HbA1c, POC (prediabetic range)     HbA1c, POC (controlled diabetic range)     Estimated Average Glucose 146        Assessment & Plan    Problem List Items Addressed This Visit      Endocrine   Diabetes ([place]) - Primary    Chronic and now well controlled Associated with HTN, HLD Up-to-date on eye exam Foot exam completed today Up-to-date on vaccinations On statin and ARB Discussed that if he is doing so well on Ozempic, he can stop Actos and slowly increase his Ozempic dose to 1 mg weekly Continue Metformin Discussed possible side effects of Ozempic Discussed goal fasting blood glucose less than 120 Discussed hypoglycemia precautions Follow-up in 3 months with PCP and recheck A1c      Relevant Medications   Semaglutide, 1 MG/DOSE, (OZEMPIC, 1 MG/DOSE,) 2 MG/1.5ML SOPN   Other Relevant Orders   POCT HgB A1C (Completed)     Other   Long term (current) use of non-steroidal anti-inflammatories ([place])    Discussed risks of long-term use of NSAIDs Advised for as needed use only As his shoulder pain has nearly resolved, he can try off of meloxicam and see how he does          Return in about 3 months (around 10/11/2019) for chronic disease f/u with PCP.   The entirety of the information documented in the History of Present Illness, Review of Systems and Physical Exam were personally obtained by me. Portions of this information were initially documented by Johny Shock , CMA and reviewed by me for thoroughness and accuracy.    Meeka Cartelli, Dionne Bucy, MD MPH [facility]

## 2019-07-13 NOTE — Assessment & Plan Note (Signed)
Chronic and now well controlled Associated with HTN, HLD Up-to-date on eye exam Foot exam completed today Up-to-date on vaccinations On statin and ARB Discussed that if he is doing so well on Ozempic, he can stop Actos and slowly increase his Ozempic dose to 1 mg weekly Continue Metformin Discussed possible side effects of Ozempic Discussed goal fasting blood glucose less than 120 Discussed hypoglycemia precautions Follow-up in 3 months with PCP and recheck A1c

## 2019-07-15 ENCOUNTER — Ambulatory Visit: Payer: Self-pay | Admitting: Family Medicine

## 2019-07-20 ENCOUNTER — Ambulatory Visit: Payer: Self-pay | Admitting: Family Medicine

## 2019-07-21 DIAGNOSIS — M5136 Other intervertebral disc degeneration, lumbar region: Secondary | ICD-10-CM | POA: Diagnosis not present

## 2019-07-21 DIAGNOSIS — M1612 Unilateral primary osteoarthritis, left hip: Secondary | ICD-10-CM | POA: Diagnosis not present

## 2019-07-21 DIAGNOSIS — M25552 Pain in left hip: Secondary | ICD-10-CM | POA: Diagnosis not present

## 2019-07-28 DIAGNOSIS — M5442 Lumbago with sciatica, left side: Secondary | ICD-10-CM | POA: Diagnosis not present

## 2019-07-28 DIAGNOSIS — M2560 Stiffness of unspecified joint, not elsewhere classified: Secondary | ICD-10-CM | POA: Diagnosis not present

## 2019-07-28 DIAGNOSIS — G8929 Other chronic pain: Secondary | ICD-10-CM | POA: Diagnosis not present

## 2019-07-28 DIAGNOSIS — M6281 Muscle weakness (generalized): Secondary | ICD-10-CM | POA: Diagnosis not present

## 2019-08-07 ENCOUNTER — Ambulatory Visit: Payer: PPO | Attending: Internal Medicine

## 2019-08-07 ENCOUNTER — Other Ambulatory Visit: Payer: Self-pay

## 2019-08-07 DIAGNOSIS — Z23 Encounter for immunization: Secondary | ICD-10-CM | POA: Insufficient documentation

## 2019-08-07 NOTE — Progress Notes (Signed)
   [id number] Vaccination Clinic  Name:  Shivay Bitner.    MRN: [id number] DOB: 1945-08-04  08/07/2019  Mr. Novacek was observed post Covid-19 immunization for 15 minutes without incidence. He was provided with Vaccine Information Sheet and instruction to access the V-Safe system.   Mr. Zins was instructed to call 911 with any severe reactions post vaccine: [name] Difficulty breathing  . Swelling of your face and throat  . A fast heartbeat  . A bad rash all over your body  . Dizziness and weakness    Immunizations Administered    Name Date Dose VIS Date Route   Pfizer COVID-19 Vaccine 08/07/2019  9:30 AM 0.3 mL 06/05/2019 Intramuscular   Manufacturer: [place]   Lot: [id number]   [place]: [id number]

## 2019-08-10 DIAGNOSIS — G8929 Other chronic pain: Secondary | ICD-10-CM | POA: Diagnosis not present

## 2019-08-10 DIAGNOSIS — M5442 Lumbago with sciatica, left side: Secondary | ICD-10-CM | POA: Diagnosis not present

## 2019-08-17 DIAGNOSIS — G8929 Other chronic pain: Secondary | ICD-10-CM | POA: Diagnosis not present

## 2019-08-17 DIAGNOSIS — M5442 Lumbago with sciatica, left side: Secondary | ICD-10-CM | POA: Diagnosis not present

## 2019-08-18 ENCOUNTER — Other Ambulatory Visit: Payer: Self-pay | Admitting: Family Medicine

## 2019-08-18 DIAGNOSIS — M5136 Other intervertebral disc degeneration, lumbar region: Secondary | ICD-10-CM

## 2019-08-18 NOTE — Telephone Encounter (Signed)
Requested medication (s) are due for refill today: yes  Requested medication (s) are on the active medication list: yes  Last refill:  05/24/2019  Future visit scheduled:  yes  Notes to clinic:  this refill cannot be delegated    Requested Prescriptions  Pending Prescriptions Disp Refills   traMADol (ULTRAM) 50 MG tablet [Pharmacy Med Name: TRAMADOL HCL 50 MG TABLET] 180 tablet     Sig: TAKE 1 TABLET BY MOUTH 3 TIMES A DAY AS NEEDED FOR BACK OR LEG PAIN      Not Delegated - Analgesics:  Opioid Agonists Failed - 08/18/2019  7:15 AM      Failed - This refill cannot be delegated      Failed - Urine Drug Screen completed in last 360 days.      Passed - Valid encounter within last 6 months    Recent Outpatient Visits           1 month ago Type 2 diabetes mellitus with other specified complication, without long-term current use of insulin [facility])   [facility] [place], Dionne Bucy, MD   2 months ago Type 2 diabetes mellitus without complication, unspecified whether long term insulin use [facility])   [facility] [name], MD   3 months ago Type 2 diabetes mellitus without complication, unspecified whether long term insulin use [facility])   [facility] [name], MD   4 months ago Fall, initial encounter   [facility] [name], MD   5 months ago Type 2 diabetes mellitus without complication, unspecified whether long term insulin use [facility])   [facility] [name], MD       Future Appointments             In 1 month [name], MD [facility]

## 2019-08-25 ENCOUNTER — Other Ambulatory Visit: Payer: Self-pay | Admitting: Neurosurgery

## 2019-08-25 DIAGNOSIS — M25559 Pain in unspecified hip: Secondary | ICD-10-CM

## 2019-08-26 DIAGNOSIS — M5442 Lumbago with sciatica, left side: Secondary | ICD-10-CM | POA: Diagnosis not present

## 2019-08-26 DIAGNOSIS — G8929 Other chronic pain: Secondary | ICD-10-CM | POA: Diagnosis not present

## 2019-09-01 ENCOUNTER — Ambulatory Visit: Payer: PPO | Attending: Internal Medicine

## 2019-09-01 DIAGNOSIS — Z23 Encounter for immunization: Secondary | ICD-10-CM | POA: Insufficient documentation

## 2019-09-01 NOTE — Progress Notes (Signed)
   [id number] Vaccination Clinic  Name:  Manuel Stafford.    MRN: [id number] DOB: 1945-11-20  09/01/2019  Manuel Stafford was observed post Covid-19 immunization for 15 minutes without incident. He was provided with Vaccine Information Sheet and instruction to access the V-Safe system.   Manuel Stafford was instructed to call 911 with any severe reactions post vaccine: [name] Difficulty breathing  . Swelling of face and throat  . A fast heartbeat  . A bad rash all over body  . Dizziness and weakness   Immunizations Administered    Name Date Dose VIS Date Route   Pfizer COVID-19 Vaccine 09/01/2019  9:41 AM 0.3 mL 06/05/2019 Intramuscular   Manufacturer: [place]   Lot: [id number]   [place]: [id number]

## 2019-09-04 ENCOUNTER — Other Ambulatory Visit: Payer: Self-pay

## 2019-09-04 ENCOUNTER — Ambulatory Visit
Admission: RE | Admit: 2019-09-04 | Discharge: 2019-09-04 | Disposition: A | Discharge status: Home / Self Care | Payer: PPO | Source: Ambulatory Visit | Attending: Neurosurgery | Admitting: Neurosurgery

## 2019-09-04 DIAGNOSIS — M25559 Pain in unspecified hip: Secondary | ICD-10-CM | POA: Diagnosis not present

## 2019-09-04 DIAGNOSIS — S3993XA Unspecified injury of pelvis, initial encounter: Secondary | ICD-10-CM | POA: Diagnosis not present

## 2019-09-11 ENCOUNTER — Other Ambulatory Visit: Payer: Self-pay | Admitting: Family Medicine

## 2019-09-11 DIAGNOSIS — J309 Allergic rhinitis, unspecified: Secondary | ICD-10-CM

## 2019-09-15 DIAGNOSIS — M5489 Other dorsalgia: Secondary | ICD-10-CM | POA: Diagnosis not present

## 2019-09-17 DIAGNOSIS — M5442 Lumbago with sciatica, left side: Secondary | ICD-10-CM | POA: Diagnosis not present

## 2019-09-17 DIAGNOSIS — G8929 Other chronic pain: Secondary | ICD-10-CM | POA: Diagnosis not present

## 2019-10-06 DIAGNOSIS — M5442 Lumbago with sciatica, left side: Secondary | ICD-10-CM | POA: Diagnosis not present

## 2019-10-06 DIAGNOSIS — G8929 Other chronic pain: Secondary | ICD-10-CM | POA: Diagnosis not present

## 2019-10-07 ENCOUNTER — Other Ambulatory Visit: Payer: Self-pay | Admitting: Family Medicine

## 2019-10-07 NOTE — Telephone Encounter (Signed)
Requested Prescriptions  Pending Prescriptions Disp Refills  . rosuvastatin (CRESTOR) 20 MG tablet [Pharmacy Med Name: ROSUVASTATIN CALCIUM 20 MG TAB] 90 tablet 1    Sig: TAKE 1 TABLET BY MOUTH EVERY DAY     Cardiovascular:  Antilipid - Statins Failed - 10/07/2019  1:24 AM      Failed - LDL in normal range and within 360 days    LDL Chol Calc (NIH)  Date Value Ref Range Status  03/18/2019 65 0 - 99 mg/dL Final         Failed - HDL in normal range and within 360 days    HDL  Date Value Ref Range Status  03/18/2019 28 (L) >39 mg/dL Final         Failed - Triglycerides in normal range and within 360 days    Triglycerides  Date Value Ref Range Status  03/18/2019 248 (H) 0 - 149 mg/dL Final         Passed - Total Cholesterol in normal range and within 360 days    Cholesterol, Total  Date Value Ref Range Status  03/18/2019 133 100 - 199 mg/dL Final         Passed - Patient is not pregnant      Passed - Valid encounter within last 12 months    Recent Outpatient Visits          2 months ago Type 2 diabetes mellitus with other specified complication, without long-term current use of insulin ([place])   [place], Dionne Bucy, MD   4 months ago Type 2 diabetes mellitus without complication, unspecified whether long term insulin use [facility])   [facility] [name], MD   5 months ago Type 2 diabetes mellitus without complication, unspecified whether long term insulin use [facility])   [facility] [name], MD   6 months ago Fall, initial encounter   [facility] Rosanna Randy, Retia Passe., MD   6 months ago Type 2 diabetes mellitus without complication, unspecified whether long term insulin use [facility])   [facility] [name], MD      Future Appointments            In 5 days [name], MD [facility]           Patient will need  cholesterol re-check in September 2021.

## 2019-10-09 NOTE — Progress Notes (Signed)
Established patient visit     Patient: Manuel Stafford.   DOB: June 18, 1946   74 y.o. Male  MRN: [id number] Visit Date: 10/12/2019  I,Sulibeya S Dimas,acting as a scribe for [name], MD.,have documented all relevant documentation on the behalf of [name], MD,as directed by  [name], MD while in the presence of [name], MD.  Today's healthcare provider: [name], MD  Subjective:    Chief Complaint  Patient presents with  . Diabetes   HPI  Patient feels well.  He is tolerating Ozempic well.  He is having no hypoglycemia. He complains of 2 issues, 1 is chronic shoulder and back pain which she is dealing with on his own.  He is getting physical therapy for his back and is just dealing with his shoulder pain.  His right shoulder is been a chronic problem his left shoulder is new. The other issue for the patient is ED.  He and his wife have not been active for 15 years but now her scarring from radiation therapy is improved and they are both hopeful that they can resume intimacy but he has significant ED.  He has failed Viagra. Diabetes Mellitus Type II, Follow-up  Lab Results  Component Value Date   HGBA1C 6.2 (A) 10/12/2019   HGBA1C 6.7 (A) 07/13/2019   HGBA1C 8.2 (A) 03/17/2019   Last seen for diabetes 3 months ago.  Management since then include seen by Dr. Brita Romp. Discussed that if he is doing so well on Ozempic, he can stop Actos and slowly increase his Ozempic dose to 1 mg weekly. He reports excellent compliance with treatment. Patient reports he is still taking Actos, patient will start Ozempic 1mg  today. He is not having side effects.  Symptoms: No fatigue No foot ulcerations No appetite changes No nausea Yes paresthesia (numbness or tingling) of the feet  No polydipsia (excessive thirst) No polyuria (frequent urination) No visual disturbances  No vomiting  Home blood sugar records: fasting range:  120-150's  Episodes of hypoglycemia? No    Current insulin regiment: Ozempic 1mg  weekly Most Recent Eye Exam: UTD Current exercise: walking and PT  Current diet habits: in general, a "healthy" diet    Pertinent Labs: Lab Results  Component Value Date   CHOL 133 03/18/2019   LDLCALC 65 03/18/2019   HDL 28 (L) 03/18/2019   TRIG 248 (H) 03/18/2019   NA 137 03/18/2019   K 4.4 03/18/2019   ALT 42 10/14/2017   AST 42 (H) 03/18/2019   CREATININE 1.31 (H) 03/18/2019   PLT 163 03/18/2019   Wt Readings from Last 3 Encounters:  10/12/19 239 lb (108.4 kg)  07/13/19 234 lb (106.1 kg)  05/25/19 236 lb 12.8 oz (107.4 kg)    -----------------------------------------------------------------------------------------   Social History   Tobacco Use  . Smoking status: Former Smoker    Packs/day: 2.00    Years: 15.00    Pack years: 30.00    Types: Cigarettes    Quit date: 06/25/1974    Years since quitting: 45.3  . Smokeless tobacco: Never Used  Substance Use Topics  . Alcohol use: No  . Drug use: No       Medications: Outpatient Medications Prior to Visit  Medication Sig  . aspirin 81 MG tablet Take 81 mg by mouth daily.   . cetirizine (ZYRTEC) 10 MG tablet TAKE 1 TABLET BY MOUTH EVERY DAY  . Cholecalciferol (VITAMIN D3) 2000 UNITS capsule Take  2,000 Units by mouth daily.  . diphenhydrAMINE (BENADRYL ALLERGY) 25 mg capsule Take by mouth.   . fluticasone (FLONASE) 50 MCG/ACT nasal spray Place 2 sprays into both nostrils daily.  . hydrochlorothiazide (HYDRODIURIL) 25 MG tablet TAKE 1 TABLET BY MOUTH EVERY DAY  . metFORMIN (GLUCOPHAGE) 1000 MG tablet TAKE 1 TABLET (1,000 MG TOTAL) BY MOUTH 2 (TWO) TIMES DAILY WITH A MEAL.  . MULTIPLE VITAMIN PO Take by mouth daily.   [name] omeprazole (PRILOSEC) 20 MG capsule TAKE 1 CAPSULE BY MOUTH EVERY DAY  . pregabalin (LYRICA) 50 MG capsule Take 50 mg by mouth 2 (two) times daily.  . rosuvastatin (CRESTOR) 20 MG tablet TAKE 1 TABLET BY MOUTH EVERY  DAY  . Saw Palmetto, Serenoa repens, (SAW PALMETTO BERRY) 160 MG CAPS Take 2 tablets by mouth daily.   . Semaglutide, 1 MG/DOSE, (OZEMPIC, 1 MG/DOSE,) 2 MG/1.5ML SOPN Inject 1 mg into the skin once a week.  . sildenafil (REVATIO) 20 MG tablet Take 1-5 tablets by mouth daily as needed.  [name] telmisartan (MICARDIS) 40 MG tablet TAKE 1 TABLET BY MOUTH EVERY DAY  . tiZANidine (ZANAFLEX) 4 MG tablet TAKE 1 TABLET (4 MG TOTAL) BY MOUTH AT BEDTIME.  . traMADol (ULTRAM) 50 MG tablet TAKE 1 TABLET BY MOUTH 3 TIMES A DAY AS NEEDED FOR BACK OR LEG PAIN  . lidocaine (LIDODERM) 5 % Place 1 patch onto the skin daily. Remove & Discard patch within 12 hours or as directed by MD (Patient not taking: Reported on 10/12/2019)  . meclizine (ANTIVERT) 25 MG tablet Take 2 tablets (50 mg total) by mouth 3 (three) times daily as needed for dizziness. (Patient not taking: Reported on 07/13/2019)  . pioglitazone (ACTOS) 15 MG tablet Take 15 mg by mouth daily.   No facility-administered medications prior to visit.    Review of Systems  Constitutional: Negative for appetite change, chills and fever.  HENT: Negative.   Eyes: Negative.   Respiratory: Negative for chest tightness, shortness of breath and wheezing.   Cardiovascular: Negative for chest pain and palpitations.  Gastrointestinal: Negative for abdominal pain, nausea and vomiting.  Endocrine: Negative for polydipsia, polyphagia and polyuria.  Genitourinary:       Chronic ED.  He does get partial erections at times.  Musculoskeletal: Positive for arthralgias and back pain.  Allergic/Immunologic: Negative.   Hematological: Negative.   Psychiatric/Behavioral: Negative.     Last metabolic panel Lab Results  Component Value Date   GLUCOSE 186 (H) 03/18/2019   NA 137 03/18/2019   K 4.4 03/18/2019   CL 97 03/18/2019   CO2 24 10/14/2017   BUN 22 03/18/2019   CREATININE 1.31 (H) 03/18/2019   GFRNONAA 53 (L) 03/18/2019   GFRAA 62 03/18/2019   CALCIUM 10.1  03/18/2019   PROT 7.3 03/18/2019   ALBUMIN 4.4 03/18/2019   LABGLOB 2.9 03/18/2019   AGRATIO 1.5 03/18/2019   BILITOT 0.6 03/18/2019   ALKPHOS 126 (H) 03/18/2019   AST 42 (H) 03/18/2019   ALT 42 10/14/2017   ANIONGAP 8 09/19/2015   Last lipids Lab Results  Component Value Date   CHOL 133 03/18/2019   HDL 28 (L) 03/18/2019   LDLCALC 65 03/18/2019   TRIG 248 (H) 03/18/2019   CHOLHDL 4.8 03/18/2019   Last hemoglobin A1c Lab Results  Component Value Date   HGBA1C 6.2 (A) 10/12/2019        Objective:    BP 122/74 (BP Location: Left Arm, Patient Position: Sitting, Cuff Size: Large)  Pulse 84   Temp (!) 96.8 F (36 C) (Temporal)   Resp 16   Ht 6' (1.829 m)   Wt 239 lb (108.4 kg)   BMI 32.41 kg/m  BP Readings from Last 3 Encounters:  10/12/19 122/74  07/13/19 118/71  05/25/19 128/70   Wt Readings from Last 3 Encounters:  10/12/19 239 lb (108.4 kg)  07/13/19 234 lb (106.1 kg)  05/25/19 236 lb 12.8 oz (107.4 kg)      Physical Exam Vitals reviewed.  Constitutional:      Appearance: Normal appearance. He is not diaphoretic.  HENT:     Head: Normocephalic and atraumatic.     Right Ear: External ear normal.     Left Ear: External ear normal.  Eyes:     General: No scleral icterus.    Conjunctiva/sclera: Conjunctivae normal.  Neck:     Vascular: No carotid bruit.  Cardiovascular:     Rate and Rhythm: Normal rate and regular rhythm.     Pulses: Normal pulses.     Heart sounds: Normal heart sounds.  Pulmonary:     Effort: Pulmonary effort is normal.     Breath sounds: Normal breath sounds.  Abdominal:     Palpations: Abdomen is soft.  Musculoskeletal:     Right lower leg: Edema present.     Left lower leg: Edema present.     Comments: He has trace lower extremity edema  Lymphadenopathy:     Cervical: No cervical adenopathy.  Neurological:     General: No focal deficit present.     Mental Status: He is alert and oriented to person, place, and time.   Psychiatric:        Mood and Affect: Mood normal.        Behavior: Behavior normal.        Thought Content: Thought content normal.        Judgment: Judgment normal.     Results for orders placed or performed in visit on 10/12/19  POCT glycosylated hemoglobin (Hb A1C)  Result Value Ref Range   Hemoglobin A1C 6.2 (A) 4.0 - 5.6 %   Est. average glucose Bld gHb Est-mCnc 131       Assessment & Plan:    1. Type 2 diabetes mellitus with other specified complication, without long-term current use of insulin (HCC) Excellent control today with A1c of 6.2.  At this time continue Ozempic at 0.5 mg weekly and stop pioglitazone.  Return to clinic 4 months.  A1c then. - POCT glycosylated hemoglobin (Hb A1C) - Semaglutide,0.25 or 0.5MG /DOS, (OZEMPIC, 0.25 OR 0.5 MG/DOSE,) 2 MG/1.5ML SOPN; Inject 0.5 mg into the skin once a week.  Dispense: 1 pen; Refill: 5  2. Benign essential HTN Good control.  He is on HCTZ and telmisartan.  3. Acute pain of both shoulders This is actually chronic pain.  Orthopedic follow-up as indicated.  4. Erectile dysfunction, unspecified erectile dysfunction type Discussed changing from Viagra to tadalafil.  Have advised him risk and benefits.  If this does not help we will follow-up with Dr. Erlene Quan. - tadalafil (CIALIS) 20 MG tablet; Take 0.5-1 tablets (10-20 mg total) by mouth every other day as needed for erectile dysfunction.  Dispense: 5 tablet; Refill: 11  5. Other hyperlipidemia On Crestor.  Recheck labs this fall that time of his physical.  6. Class 1 obesity due to excess calories with serious comorbidity and body mass index (BMI) of 31.0 to 31.9 in adult Patient continues to work on weight  loss.    No follow-ups on file.     I, [name], MD, have reviewed all documentation for this visit. The documentation on 10/12/19 for the exam, diagnosis, procedures, and orders are all accurate and complete.    Dary Dilauro Cranford Mon, MD  [facility] [phone] (phone) [phone] (fax)  [place]

## 2019-10-12 ENCOUNTER — Telehealth: Payer: Self-pay | Admitting: Family Medicine

## 2019-10-12 ENCOUNTER — Other Ambulatory Visit: Payer: Self-pay

## 2019-10-12 ENCOUNTER — Ambulatory Visit (INDEPENDENT_AMBULATORY_CARE_PROVIDER_SITE_OTHER): Payer: PPO | Admitting: Family Medicine

## 2019-10-12 ENCOUNTER — Encounter: Payer: Self-pay | Admitting: Family Medicine

## 2019-10-12 VITALS — BP 122/74 | HR 84 | Temp 96.8°F | Resp 16 | Ht 72.0 in | Wt 239.0 lb

## 2019-10-12 DIAGNOSIS — E6609 Other obesity due to excess calories: Secondary | ICD-10-CM | POA: Diagnosis not present

## 2019-10-12 DIAGNOSIS — I10 Essential (primary) hypertension: Secondary | ICD-10-CM | POA: Diagnosis not present

## 2019-10-12 DIAGNOSIS — M25512 Pain in left shoulder: Secondary | ICD-10-CM

## 2019-10-12 DIAGNOSIS — E7849 Other hyperlipidemia: Secondary | ICD-10-CM | POA: Diagnosis not present

## 2019-10-12 DIAGNOSIS — M25511 Pain in right shoulder: Secondary | ICD-10-CM

## 2019-10-12 DIAGNOSIS — E1169 Type 2 diabetes mellitus with other specified complication: Secondary | ICD-10-CM | POA: Diagnosis not present

## 2019-10-12 DIAGNOSIS — N529 Male erectile dysfunction, unspecified: Secondary | ICD-10-CM | POA: Diagnosis not present

## 2019-10-12 DIAGNOSIS — Z6831 Body mass index (BMI) 31.0-31.9, adult: Secondary | ICD-10-CM

## 2019-10-12 LAB — POCT GLYCOSYLATED HEMOGLOBIN (HGB A1C)
Est. average glucose Bld gHb Est-mCnc: 131
Hemoglobin A1C: 6.2 % — AB (ref 4.0–5.6)

## 2019-10-12 MED ORDER — OZEMPIC (0.25 OR 0.5 MG/DOSE) 2 MG/1.5ML ~~LOC~~ SOPN: 0.5000 mg | PEN_INJECTOR | SUBCUTANEOUS | 5 refills | Status: DC

## 2019-10-12 MED ORDER — TADALAFIL 20 MG PO TABS: 10.0000 mg | ORAL_TABLET | ORAL | 11 refills | Status: DC | PRN

## 2019-10-12 NOTE — Assessment & Plan Note (Signed)
Discussed importance of healthy weight management Discussed diet and exercise  

## 2019-10-12 NOTE — Assessment & Plan Note (Signed)
Recurrent  Continue stretching exercise. Follow up as needed.

## 2019-10-12 NOTE — Assessment & Plan Note (Addendum)
Chronic and well controlled Associated with HTN, HLD Up-to-date on eye exam On statin and ARB Continue Ozempic  Discontinue Actos Continue Metformin

## 2019-10-12 NOTE — Assessment & Plan Note (Signed)
Start on Tadalafil

## 2019-10-12 NOTE — Assessment & Plan Note (Signed)
Previously well controlled Continue statin Repeat FLP and CMP  

## 2019-10-12 NOTE — Telephone Encounter (Signed)
Pt stated after his appt this morning only 1 pen of his Semaglutide,0.25 or 0.5MG /DOS, (OZEMPIC, 0.25 OR 0.5 MG/DOSE,) 2 MG/1.5ML SOPN  Was sent to pharamacy. Pt is requesting a 90 day supply instead of one month be sent. Please advise.

## 2019-10-12 NOTE — Assessment & Plan Note (Signed)
Stable Continue current medication  Follow up 4 mths

## 2019-10-12 NOTE — Telephone Encounter (Signed)
tadalafil (CIALIS) 20 MG tablet     Patient requesting call back about Ozempic and also states that he was supposed to receive 25 tablets of tadalafil (CIALIS) 20 MG tablet as well

## 2019-10-13 MED ORDER — TADALAFIL 20 MG PO TABS: 10.0000 mg | ORAL_TABLET | ORAL | 11 refills | Status: DC | PRN

## 2019-10-13 MED ORDER — OZEMPIC (0.25 OR 0.5 MG/DOSE) 2 MG/1.5ML ~~LOC~~ SOPN: 0.5000 mg | PEN_INJECTOR | SUBCUTANEOUS | 5 refills | Status: DC

## 2019-10-13 NOTE — Telephone Encounter (Signed)
Patient called and requested to speak to Dr Rosanna Randy or his nurse today when they have a chance. Can be reached at Ph# [phone]

## 2019-10-13 NOTE — Telephone Encounter (Signed)
Rx's were resent pharmacy with current refills. Patient was advised.

## 2019-10-13 NOTE — Telephone Encounter (Signed)
Pt stated he needs to speak with Dr. Alben Spittle nurse regarding his Rx. Pt requests call back

## 2019-10-30 ENCOUNTER — Telehealth: Payer: Self-pay

## 2019-10-30 DIAGNOSIS — E1169 Type 2 diabetes mellitus with other specified complication: Secondary | ICD-10-CM

## 2019-10-30 MED ORDER — OZEMPIC (0.25 OR 0.5 MG/DOSE) 2 MG/1.5ML ~~LOC~~ SOPN: 0.5000 mg | PEN_INJECTOR | SUBCUTANEOUS | 5 refills | Status: DC

## 2019-10-30 NOTE — Telephone Encounter (Signed)
Copied from [place] [id number]. Topic: General - Inquiry >> Oct 30, 2019 10:33 AM Greggory Keen D wrote: Reason for CRM: Pt called saying he wants to changes his pharmacy back to [facility] from [facility].  He said [facility] said they didn't receive the rx for the Ozempic so he wants Dr. Rosanna Randy to [facility] [place] [facility] from now on.  CB# [phone]

## 2019-10-30 NOTE — Telephone Encounter (Signed)
[facility] didn't receive it.

## 2019-11-03 NOTE — Progress Notes (Signed)
Manuel Stafford,acting as a scribe for Manuel Durie, MD.,have documented all relevant documentation on the behalf of Manuel Durie, MD,as directed by  Manuel Durie, MD while in the presence of Manuel Durie, MD.  Established patient visit   Patient: Manuel Stafford.   DOB: December 10, 1945   74 y.o. Male  MRN: [id number] Visit Date: 11/05/2019  Today's healthcare provider: [name], MD   Chief Complaint  Patient presents with  . Medication Management   Subjective    HPI  Patient presents today for medication management, he would like to discuss with doctor discontinuing some medications.  Patient states his new insurance does not cover the Ozempic as well and is very expensive.  He would like to manage his diabetes without it if possible     Medications: Outpatient Medications Prior to Visit  Medication Sig  . aspirin 81 MG tablet Take 81 mg by mouth daily.   . cetirizine (ZYRTEC) 10 MG tablet TAKE 1 TABLET BY MOUTH EVERY DAY  . Cholecalciferol (VITAMIN D3) 2000 UNITS capsule Take 2,000 Units by mouth daily.  . diphenhydrAMINE (BENADRYL ALLERGY) 25 mg capsule Take by mouth.   . fluticasone (FLONASE) 50 MCG/ACT nasal spray Place 2 sprays into both nostrils daily.  . hydrochlorothiazide (HYDRODIURIL) 25 MG tablet TAKE 1 TABLET BY MOUTH EVERY DAY  . metFORMIN (GLUCOPHAGE) 1000 MG tablet TAKE 1 TABLET (1,000 MG TOTAL) BY MOUTH 2 (TWO) TIMES DAILY WITH A MEAL.  . MULTIPLE VITAMIN PO Take by mouth daily.   [name] omeprazole (PRILOSEC) 20 MG capsule TAKE 1 CAPSULE BY MOUTH EVERY DAY  . rosuvastatin (CRESTOR) 20 MG tablet TAKE 1 TABLET BY MOUTH EVERY DAY  . Saw Palmetto, Serenoa repens, (SAW PALMETTO BERRY) 160 MG CAPS Take 2 tablets by mouth daily.   . Semaglutide,0.25 or 0.5MG /DOS, (OZEMPIC, 0.25 OR 0.5 MG/DOSE,) 2 MG/1.5ML SOPN Inject 0.5 mg into the skin once a week.  . sildenafil (REVATIO) 20 MG tablet Take 1-5 tablets by mouth daily as needed.  .  tadalafil (CIALIS) 20 MG tablet Take 0.5-1 tablets (10-20 mg total) by mouth every other day as needed for erectile dysfunction.  [name] telmisartan (MICARDIS) 40 MG tablet TAKE 1 TABLET BY MOUTH EVERY DAY  . tiZANidine (ZANAFLEX) 4 MG tablet TAKE 1 TABLET (4 MG TOTAL) BY MOUTH AT BEDTIME.  . traMADol (ULTRAM) 50 MG tablet TAKE 1 TABLET BY MOUTH 3 TIMES A DAY AS NEEDED FOR BACK OR LEG PAIN  . lidocaine (LIDODERM) 5 % Place 1 patch onto the skin daily. Remove & Discard patch within 12 hours or as directed by MD (Patient not taking: Reported on 10/12/2019)  . meclizine (ANTIVERT) 25 MG tablet Take 2 tablets (50 mg total) by mouth 3 (three) times daily as needed for dizziness. (Patient not taking: Reported on 07/13/2019)  . pregabalin (LYRICA) 50 MG capsule Take 50 mg by mouth 2 (two) times daily.   No facility-administered medications prior to visit.    Review of Systems  Constitutional: Negative for appetite change, chills and fever.  Respiratory: Negative for chest tightness, shortness of breath and wheezing.   Cardiovascular: Negative for chest pain and palpitations.  Gastrointestinal: Negative for abdominal pain, nausea and vomiting.       Objective    BP 129/76 (BP Location: Right Arm, Patient Position: Sitting, Cuff Size: Large)   Pulse 81   Temp (!) 96.9 F (36.1 C) (Temporal)   Ht 6' (1.829 m)  Wt 239 lb 12.8 oz (108.8 kg)   BMI 32.52 kg/m  BP Readings from Last 3 Encounters:  11/05/19 129/76  10/12/19 122/74  07/13/19 118/71   Wt Readings from Last 3 Encounters:  11/05/19 239 lb 12.8 oz (108.8 kg)  10/12/19 239 lb (108.4 kg)  07/13/19 234 lb (106.1 kg)      Physical Exam Vitals reviewed.  Constitutional:      Appearance: Normal appearance.  HENT:     Head: Normocephalic and atraumatic.     Right Ear: External ear normal. There is no impacted cerumen.     Left Ear: External ear normal. There is no impacted cerumen.  Eyes:     General: No scleral icterus.     Conjunctiva/sclera: Conjunctivae normal.  Cardiovascular:     Rate and Rhythm: Normal rate and regular rhythm.     Pulses: Normal pulses.     Heart sounds: Normal heart sounds.  Pulmonary:     Effort: Pulmonary effort is normal.     Breath sounds: Normal breath sounds.  Musculoskeletal:        General: Normal range of motion.  Skin:    General: Skin is warm and dry.  Neurological:     Mental Status: He is alert and oriented to person, place, and time. Mental status is at baseline.  Psychiatric:        Mood and Affect: Mood normal.        Behavior: Behavior normal.        Thought Content: Thought content normal.        Judgment: Judgment normal.       No results found for any visits on 11/05/19.  Assessment & Plan     Problem List Items Addressed This Visit      Cardiovascular and Mediastinum   Benign essential HTN    Well controlled Cut HCTZ down to 1/2 daily           Endocrine   Diabetes ([place]) - Primary    Chronic and well controlled  Discontinue Ozempic  Work on diet and exercise Up to date on eye exam  Continue metformin On statin and ARB Follow up in 3 months         Other   HLD (hyperlipidemia)    Well controlled  Continue Statin        Stop Ozempic and return to clinic 3 months.  Patient is willing to go back on it if needed. Return in about 3 months (around 02/05/2020).      I, Manuel Durie, MD, have reviewed all documentation for this visit. The documentation on 11/08/19 for the exam, diagnosis, procedures, and orders are all accurate and complete.    Emri Sample Cranford Mon, MD  [facility] [phone] (phone) [phone] (fax)  [place]

## 2019-11-05 ENCOUNTER — Ambulatory Visit (INDEPENDENT_AMBULATORY_CARE_PROVIDER_SITE_OTHER): Payer: PPO | Admitting: Family Medicine

## 2019-11-05 ENCOUNTER — Other Ambulatory Visit: Payer: Self-pay

## 2019-11-05 ENCOUNTER — Encounter: Payer: Self-pay | Admitting: Family Medicine

## 2019-11-05 VITALS — BP 129/76 | HR 81 | Temp 96.9°F | Ht 72.0 in | Wt 239.8 lb

## 2019-11-05 DIAGNOSIS — E1169 Type 2 diabetes mellitus with other specified complication: Secondary | ICD-10-CM | POA: Diagnosis not present

## 2019-11-05 DIAGNOSIS — E7849 Other hyperlipidemia: Secondary | ICD-10-CM

## 2019-11-05 DIAGNOSIS — I10 Essential (primary) hypertension: Secondary | ICD-10-CM

## 2019-11-05 NOTE — Patient Instructions (Addendum)
Take 1/2 of HCTZ daily !!!

## 2019-11-05 NOTE — Assessment & Plan Note (Signed)
Well controlled  Continue Statin

## 2019-11-05 NOTE — Assessment & Plan Note (Addendum)
Well controlled Cut HCTZ down to 1/2 daily

## 2019-11-05 NOTE — Assessment & Plan Note (Addendum)
Chronic and well controlled  Discontinue Ozempic  Work on diet and exercise Up to date on eye exam  Continue metformin On statin and ARB Follow up in 3 months

## 2019-11-16 ENCOUNTER — Other Ambulatory Visit: Payer: Self-pay | Admitting: Family Medicine

## 2019-11-23 ENCOUNTER — Other Ambulatory Visit: Payer: Self-pay | Admitting: Family Medicine

## 2019-11-27 ENCOUNTER — Other Ambulatory Visit: Payer: Self-pay | Admitting: Family Medicine

## 2019-11-27 DIAGNOSIS — M5136 Other intervertebral disc degeneration, lumbar region: Secondary | ICD-10-CM

## 2019-12-02 ENCOUNTER — Other Ambulatory Visit: Payer: Self-pay | Admitting: Family Medicine

## 2019-12-06 ENCOUNTER — Other Ambulatory Visit: Payer: Self-pay | Admitting: Family Medicine

## 2019-12-06 DIAGNOSIS — J309 Allergic rhinitis, unspecified: Secondary | ICD-10-CM

## 2019-12-06 NOTE — Telephone Encounter (Signed)
Requested Prescriptions  Pending Prescriptions Disp Refills   cetirizine (ZYRTEC) 10 MG tablet [Pharmacy Med Name: CETIRIZINE HCL 10 MG TABLET] 90 tablet 0    Sig: TAKE 1 TABLET BY MOUTH EVERY DAY     Ear, Nose, and Throat:  Antihistamines Passed - 12/06/2019  1:06 AM      Passed - Valid encounter within last 12 months    Recent Outpatient Visits          1 month ago Type 2 diabetes mellitus with other specified complication, without long-term current use of insulin [facility])   [facility] [name], MD   1 month ago Type 2 diabetes mellitus with other specified complication, without long-term current use of insulin [facility])   [facility] [name], MD   4 months ago Type 2 diabetes mellitus with other specified complication, without long-term current use of insulin [facility])   [facility] [place], Dionne Bucy, MD   6 months ago Type 2 diabetes mellitus without complication, unspecified whether long term insulin use [facility])   [facility] [name], MD   7 months ago Type 2 diabetes mellitus without complication, unspecified whether long term insulin use [facility])   [facility] [name], MD      Future Appointments            In 2 months [name], MD [facility] [place]   In 3 months Ralene Bathe, MD [place]   In 3 months [name], MD [facility] [place]

## 2019-12-30 ENCOUNTER — Other Ambulatory Visit: Payer: Self-pay | Admitting: Family Medicine

## 2019-12-30 DIAGNOSIS — M5136 Other intervertebral disc degeneration, lumbar region: Secondary | ICD-10-CM

## 2020-01-01 DIAGNOSIS — R251 Tremor, unspecified: Secondary | ICD-10-CM | POA: Diagnosis not present

## 2020-01-01 DIAGNOSIS — R259 Unspecified abnormal involuntary movements: Secondary | ICD-10-CM | POA: Diagnosis not present

## 2020-02-08 NOTE — Progress Notes (Signed)
Established patient visit   Patient: Manuel Stafford.   DOB: 09/07/1945   74 y.o. Male  MRN: [id number] Visit Date: 02/09/2020  Today's healthcare provider: [name], MD   Chief Complaint  Patient presents with  . Diabetes  . Hypertension   [employer] as a scribe for [name], MD.,have documented all relevant documentation on the behalf of [name], MD,as directed by  [name], MD while in the presence of [name], MD.  Subjective    HPI  Ozempic was caused from the patient about $240 a month but it was helping him with his diabetes and he felt better.  The other issue the patient brings up today is some mild insomnia and he wonders if it could be some sleep apnea.  He does snore.  He will ask his wife about apneic spells.  He request refill on Cialis for ED which is helping.  He also has a trigger finger on the right fifth finger and left index finger has some OA changes. Diabetes Mellitus Type II, follow-up  Lab Results  Component Value Date   HGBA1C 7.1 (A) 02/09/2020   HGBA1C 6.2 (A) 10/12/2019   HGBA1C 6.7 (A) 07/13/2019   Last seen for diabetes 3 months ago.      Management since then includes; Chronic and well controlled. Discontinued Ozempic. Work on diet and exercise. Continue metformin. He reports good compliance with treatment. He is not having side effects.   Home blood sugar records: fasting range: 170-180's  Episodes of hypoglycemia? No    Current insulin regiment: None Most Recent Eye Exam: 04/20/2019  --------------------------------------------------------------------------------------------------- Hypertension, follow-up  BP Readings from Last 3 Encounters:  02/09/20 (!) 142/86  11/05/19 129/76  10/12/19 122/74   Wt Readings from Last 3 Encounters:  02/09/20 245 lb 6.4 oz (111.3 kg)  11/05/19 239 lb 12.8 oz (108.8 kg)  10/12/19 239 lb (108.4 kg)     He was last seen  for hypertension 3 months ago.  BP at that visit was 129/76. Management since that visit includes; Well controlled. Cut HCTZ down to 1/2 daily. He reports good compliance with treatment. He is not having side effects.  He is not exercising. He is adherent to low salt diet.   Outside blood pressures are being checked at home.  He does not smoke.  Use of agents associated with hypertension: none.   --------------------------------------------------------------------------------------------------       Medications: Outpatient Medications Prior to Visit  Medication Sig  . aspirin 81 MG tablet Take 81 mg by mouth daily.   . cetirizine (ZYRTEC) 10 MG tablet TAKE 1 TABLET BY MOUTH EVERY DAY  . Cholecalciferol (VITAMIN D3) 2000 UNITS capsule Take 2,000 Units by mouth daily.  . diphenhydrAMINE (BENADRYL ALLERGY) 25 mg capsule Take by mouth.   . hydrochlorothiazide (HYDRODIURIL) 25 MG tablet TAKE 1 TABLET BY MOUTH EVERY DAY  . meclizine (ANTIVERT) 25 MG tablet Take 2 tablets (50 mg total) by mouth 3 (three) times daily as needed for dizziness.  . metFORMIN (GLUCOPHAGE) 1000 MG tablet TAKE 1 TABLET (1,000 MG TOTAL) BY MOUTH 2 (TWO) TIMES DAILY WITH A MEAL.  . MULTIPLE VITAMIN PO Take by mouth daily.   [name] omeprazole (PRILOSEC) 20 MG capsule TAKE 1 CAPSULE BY MOUTH EVERY DAY  . rosuvastatin (CRESTOR) 20 MG tablet TAKE 1 TABLET BY MOUTH EVERY DAY  . Saw Palmetto, Serenoa repens, (SAW PALMETTO BERRY) 160 MG CAPS Take 2 tablets  by mouth daily.   . sildenafil (REVATIO) 20 MG tablet Take 1-5 tablets by mouth daily as needed.  [name] telmisartan (MICARDIS) 40 MG tablet TAKE 1 TABLET BY MOUTH EVERY DAY  . tiZANidine (ZANAFLEX) 4 MG tablet TAKE 1 TABLET (4 MG TOTAL) BY MOUTH AT BEDTIME.  . traMADol (ULTRAM) 50 MG tablet TAKE 1 TABLET BY MOUTH 3 TIMES A DAY AS NEEDED FOR BACK OR LEG PAIN  . [DISCONTINUED] fluticasone (FLONASE) 50 MCG/ACT nasal spray Place 2 sprays into both nostrils daily.  . [DISCONTINUED]  pregabalin (LYRICA) 50 MG capsule Take 50 mg by mouth 2 (two) times daily.  . [DISCONTINUED] tadalafil (CIALIS) 20 MG tablet Take 0.5-1 tablets (10-20 mg total) by mouth every other day as needed for erectile dysfunction.  [name] rOPINIRole (REQUIP) 0.25 MG tablet Take by mouth daily.  . Semaglutide,0.25 or 0.5MG /DOS, (OZEMPIC, 0.25 OR 0.5 MG/DOSE,) 2 MG/1.5ML SOPN Inject 0.5 mg into the skin once a week. (Patient not taking: Reported on 02/09/2020)  . [DISCONTINUED] lidocaine (LIDODERM) 5 % Place 1 patch onto the skin daily. Remove & Discard patch within 12 hours or as directed by MD (Patient not taking: Reported on 10/12/2019)   No facility-administered medications prior to visit.    Review of Systems  Constitutional: Negative for appetite change, chills and fever.  Respiratory: Negative for chest tightness, shortness of breath and wheezing.   Cardiovascular: Negative for chest pain and palpitations.  Gastrointestinal: Negative for abdominal pain, nausea and vomiting.    Last hemoglobin A1c Lab Results  Component Value Date   HGBA1C 7.1 (A) 02/09/2020      Objective    BP (!) 142/86 (BP Location: Right Arm, Patient Position: Sitting, Cuff Size: Large)   Pulse 89   Temp 97.8 F (36.6 C) (Oral)   Wt 245 lb 6.4 oz (111.3 kg)   BMI 33.28 kg/m  BP Readings from Last 3 Encounters:  02/09/20 (!) 142/86  11/05/19 129/76  10/12/19 122/74   Wt Readings from Last 3 Encounters:  02/09/20 245 lb 6.4 oz (111.3 kg)  11/05/19 239 lb 12.8 oz (108.8 kg)  10/12/19 239 lb (108.4 kg)      Physical Exam Vitals reviewed.  Constitutional:      Appearance: Normal appearance. He is not diaphoretic.  HENT:     Head: Normocephalic and atraumatic.     Right Ear: External ear normal.     Left Ear: External ear normal.  Eyes:     General: No scleral icterus.    Conjunctiva/sclera: Conjunctivae normal.  Neck:     Vascular: No carotid bruit.  Cardiovascular:     Rate and Rhythm: Normal rate and  regular rhythm.     Pulses: Normal pulses.     Heart sounds: Normal heart sounds.  Pulmonary:     Effort: Pulmonary effort is normal.     Breath sounds: Normal breath sounds.  Abdominal:     Palpations: Abdomen is soft.  Musculoskeletal:     Comments: He has trace lower extremity edema  Lymphadenopathy:     Cervical: No cervical adenopathy.  Neurological:     General: No focal deficit present.     Mental Status: He is alert and oriented to person, place, and time.     Comments: Normal diabetic foot exam  Psychiatric:        Mood and Affect: Mood normal.        Behavior: Behavior normal.        Thought Content: Thought content normal.  Judgment: Judgment normal.      General: Appearance:    Obese male in no acute distress  Eyes:    PERRL, conjunctiva/corneas clear, EOM's intact       Lungs:     Clear to auscultation bilaterally, respirations unlabored  Heart:    Normal heart rate. Normal rhythm. No murmurs, rubs, or gallops.   MS:   All extremities are intact.   Neurologic:   Awake, alert, oriented x 3. No apparent focal neurological           defect.         Results for orders placed or performed in visit on 02/09/20  POCT HgB A1C  Result Value Ref Range   Hemoglobin A1C 7.1 (A) 4.0 - 5.6 %   Est. average glucose Bld gHb Est-mCnc 157     Assessment & Plan    1. Type 2 diabetes mellitus with other specified complication, without long-term current use of insulin (HCC) Change to Trulicity to see if insurance will cover better than Ozempic  - POCT HgB A1C--7.1% today which is pretty good. - Dulaglutide (TRULICITY) 1.5 YM/4.1RA SOPN; Inject 0.5 mLs (1.5 mg total) into the skin once a week.  Dispense: 0.5 mL; Refill: 5  2. Benign essential HTN Well controlled Continue current medications F/u in 3 months   3. Erectile dysfunction, unspecified erectile dysfunction type  - tadalafil (CIALIS) 20 MG tablet; Take 0.5-1 tablets (10-20 mg total) by mouth every other day  as needed for erectile dysfunction.  Dispense: 30 tablet; Refill: 11 4.  Trigger finger May need referral to hand surgeon. 5.  Osteoarthritis 6.  Possible obstructive sleep apnea  Return in about 3 months (around 05/11/2020).         Alva Broxson Cranford Mon, MD  [facility] [phone] (phone) [phone] (fax)  [place]

## 2020-02-09 ENCOUNTER — Other Ambulatory Visit: Payer: Self-pay

## 2020-02-09 ENCOUNTER — Encounter: Payer: Self-pay | Admitting: Family Medicine

## 2020-02-09 ENCOUNTER — Ambulatory Visit (INDEPENDENT_AMBULATORY_CARE_PROVIDER_SITE_OTHER): Payer: PPO | Admitting: Family Medicine

## 2020-02-09 VITALS — BP 142/86 | HR 89 | Temp 97.8°F | Wt 245.4 lb

## 2020-02-09 DIAGNOSIS — Z6831 Body mass index (BMI) 31.0-31.9, adult: Secondary | ICD-10-CM

## 2020-02-09 DIAGNOSIS — N529 Male erectile dysfunction, unspecified: Secondary | ICD-10-CM

## 2020-02-09 DIAGNOSIS — M47817 Spondylosis without myelopathy or radiculopathy, lumbosacral region: Secondary | ICD-10-CM | POA: Diagnosis not present

## 2020-02-09 DIAGNOSIS — E1169 Type 2 diabetes mellitus with other specified complication: Secondary | ICD-10-CM

## 2020-02-09 DIAGNOSIS — I10 Essential (primary) hypertension: Secondary | ICD-10-CM

## 2020-02-09 DIAGNOSIS — G4733 Obstructive sleep apnea (adult) (pediatric): Secondary | ICD-10-CM

## 2020-02-09 DIAGNOSIS — E6609 Other obesity due to excess calories: Secondary | ICD-10-CM

## 2020-02-09 DIAGNOSIS — M65351 Trigger finger, right little finger: Secondary | ICD-10-CM

## 2020-02-09 LAB — POCT GLYCOSYLATED HEMOGLOBIN (HGB A1C)
Est. average glucose Bld gHb Est-mCnc: 157
Hemoglobin A1C: 7.1 % — AB (ref 4.0–5.6)

## 2020-02-09 MED ORDER — TADALAFIL 20 MG PO TABS
10.0000 mg | ORAL_TABLET | ORAL | 11 refills | Status: DC | PRN
Start: 2020-02-09 — End: 2021-06-07

## 2020-02-09 MED ORDER — TRULICITY 1.5 MG/0.5ML ~~LOC~~ SOAJ: 1.5000 mg | SUBCUTANEOUS | 5 refills | Status: DC

## 2020-03-02 ENCOUNTER — Ambulatory Visit: Payer: PPO | Admitting: Family Medicine

## 2020-03-02 NOTE — Progress Notes (Deleted)
     Established patient visit   Patient: Manuel Stafford.   DOB: 1946/01/14   74 y.o. Male  MRN: [id number] Visit Date: 03/02/2020  Today's healthcare provider: [name], MD   No chief complaint on file.  Subjective    HPI  ***  {Show patient history (optional):23778::" "}   Medications: Outpatient Medications Prior to Visit  Medication Sig  . aspirin 81 MG tablet Take 81 mg by mouth daily.   . cetirizine (ZYRTEC) 10 MG tablet TAKE 1 TABLET BY MOUTH EVERY DAY  . Cholecalciferol (VITAMIN D3) 2000 UNITS capsule Take 2,000 Units by mouth daily.  . diphenhydrAMINE (BENADRYL ALLERGY) 25 mg capsule Take by mouth.   . Dulaglutide (TRULICITY) 1.5 HL/4.5GY SOPN Inject 0.5 mLs (1.5 mg total) into the skin once a week.  . hydrochlorothiazide (HYDRODIURIL) 25 MG tablet TAKE 1 TABLET BY MOUTH EVERY DAY  . meclizine (ANTIVERT) 25 MG tablet Take 2 tablets (50 mg total) by mouth 3 (three) times daily as needed for dizziness.  . metFORMIN (GLUCOPHAGE) 1000 MG tablet TAKE 1 TABLET (1,000 MG TOTAL) BY MOUTH 2 (TWO) TIMES DAILY WITH A MEAL.  . MULTIPLE VITAMIN PO Take by mouth daily.   [name] omeprazole (PRILOSEC) 20 MG capsule TAKE 1 CAPSULE BY MOUTH EVERY DAY  . rOPINIRole (REQUIP) 0.25 MG tablet Take by mouth daily.  . rosuvastatin (CRESTOR) 20 MG tablet TAKE 1 TABLET BY MOUTH EVERY DAY  . Saw Palmetto, Serenoa repens, (SAW PALMETTO BERRY) 160 MG CAPS Take 2 tablets by mouth daily.   . Semaglutide,0.25 or 0.5MG /DOS, (OZEMPIC, 0.25 OR 0.5 MG/DOSE,) 2 MG/1.5ML SOPN Inject 0.5 mg into the skin once a week. (Patient not taking: Reported on 02/09/2020)  . sildenafil (REVATIO) 20 MG tablet Take 1-5 tablets by mouth daily as needed.  . tadalafil (CIALIS) 20 MG tablet Take 0.5-1 tablets (10-20 mg total) by mouth every other day as needed for erectile dysfunction.  [name] telmisartan (MICARDIS) 40 MG tablet TAKE 1 TABLET BY MOUTH EVERY DAY  . tiZANidine (ZANAFLEX) 4 MG tablet TAKE 1 TABLET (4 MG  TOTAL) BY MOUTH AT BEDTIME.  . traMADol (ULTRAM) 50 MG tablet TAKE 1 TABLET BY MOUTH 3 TIMES A DAY AS NEEDED FOR BACK OR LEG PAIN   No facility-administered medications prior to visit.    Review of Systems  {Heme  Chem  Endocrine  Serology  Results Review (optional):23779::" "}  Objective    There were no vitals taken for this visit. {Show previous vital signs (optional):23777::" "}  Physical Exam  ***  No results found for any visits on 03/02/20.  Assessment & Plan     ***  No follow-ups on file.      {provider attestation***:1}   [name], MD  [facility] [phone] (phone) [phone] (fax)  [place]

## 2020-03-02 NOTE — Progress Notes (Signed)
Trena Platt Cummings,acting as a scribe for [name], MD.,have documented all relevant documentation on the behalf of [name], MD,as directed by  [name], MD while in the presence of [name], MD.  Established patient visit   Patient: Manuel Stafford.   DOB: February 05, 1946   74 y.o. Male  MRN: [id number] Visit Date: 03/03/2020  Today's healthcare provider: [name], MD   Chief Complaint  Patient presents with  . Nail Problem   Subjective    HPI   Patient presents in office today for a toenail problem of his big toe on his left foot. Patient says that he thinks it happened when he was cleaning under his toe nails. Patient says that it has not had any redness or pus, it only hurts when you touch it.  1 month ago patient was helping him check his feet and clean his feet when she accidentally stuck file under his left great toenail.  There have been no signs of infection but is just extremely tender to the touch over top of the nail.  The nail appears to be intact.      Medications: Outpatient Medications Prior to Visit  Medication Sig  . aspirin 81 MG tablet Take 81 mg by mouth daily.   . cetirizine (ZYRTEC) 10 MG tablet TAKE 1 TABLET BY MOUTH EVERY DAY  . Cholecalciferol (VITAMIN D3) 2000 UNITS capsule Take 2,000 Units by mouth daily.  . diphenhydrAMINE (BENADRYL ALLERGY) 25 mg capsule Take by mouth.   . Dulaglutide (TRULICITY) 1.5 KN/3.9JQ SOPN Inject 0.5 mLs (1.5 mg total) into the skin once a week.  . hydrochlorothiazide (HYDRODIURIL) 25 MG tablet TAKE 1 TABLET BY MOUTH EVERY DAY  . meclizine (ANTIVERT) 25 MG tablet Take 2 tablets (50 mg total) by mouth 3 (three) times daily as needed for dizziness.  . metFORMIN (GLUCOPHAGE) 1000 MG tablet TAKE 1 TABLET (1,000 MG TOTAL) BY MOUTH 2 (TWO) TIMES DAILY WITH A MEAL.  . MULTIPLE VITAMIN PO Take by mouth daily.   [name] omeprazole (PRILOSEC) 20 MG capsule TAKE 1 CAPSULE BY MOUTH EVERY DAY   . rOPINIRole (REQUIP) 0.25 MG tablet Take by mouth daily.  . rosuvastatin (CRESTOR) 20 MG tablet TAKE 1 TABLET BY MOUTH EVERY DAY  . Saw Palmetto, Serenoa repens, (SAW PALMETTO BERRY) 160 MG CAPS Take 2 tablets by mouth daily.   . tadalafil (CIALIS) 20 MG tablet Take 0.5-1 tablets (10-20 mg total) by mouth every other day as needed for erectile dysfunction.  [name] telmisartan (MICARDIS) 40 MG tablet TAKE 1 TABLET BY MOUTH EVERY DAY  . tiZANidine (ZANAFLEX) 4 MG tablet TAKE 1 TABLET (4 MG TOTAL) BY MOUTH AT BEDTIME.  . traMADol (ULTRAM) 50 MG tablet TAKE 1 TABLET BY MOUTH 3 TIMES A DAY AS NEEDED FOR BACK OR LEG PAIN  . Semaglutide,0.25 or 0.5MG /DOS, (OZEMPIC, 0.25 OR 0.5 MG/DOSE,) 2 MG/1.5ML SOPN Inject 0.5 mg into the skin once a week. (Patient not taking: Reported on 02/09/2020)  . sildenafil (REVATIO) 20 MG tablet Take 1-5 tablets by mouth daily as needed. (Patient not taking: Reported on 03/03/2020)   No facility-administered medications prior to visit.    Review of Systems  Last hemoglobin A1c Lab Results  Component Value Date   HGBA1C 7.1 (A) 02/09/2020      Objective    BP 132/75 (BP Location: Left Arm, Patient Position: Sitting, Cuff Size: Large)   Pulse 81   Temp 97.7 F (36.5  C) (Oral)   Ht 6' (1.829 m)   Wt 247 lb 6.4 oz (112.2 kg)   BMI 33.55 kg/m  BP Readings from Last 3 Encounters:  03/03/20 132/75  02/09/20 (!) 142/86  11/05/19 129/76   Wt Readings from Last 3 Encounters:  03/03/20 247 lb 6.4 oz (112.2 kg)  02/09/20 245 lb 6.4 oz (111.3 kg)  11/05/19 239 lb 12.8 oz (108.8 kg)      Physical Exam Vitals reviewed.  Constitutional:      Appearance: Normal appearance.  HENT:     Head: Normocephalic and atraumatic.  Eyes:     General: No scleral icterus. Cardiovascular:     Pulses: Normal pulses.  Pulmonary:     Effort: Pulmonary effort is normal.  Musculoskeletal:        General: No swelling.     Comments: There is no swelling tenderness or drainage of the  left great toe.  It is tender on top of the nail.  Pulses are good and neurologic exam is normal.  Neurological:     Mental Status: He is alert.       No results found for any visits on 03/03/20.  Assessment & Plan     1. Pain of left great toe The pain is been continuing for a month and is not improving.  Refer to podiatry to see if there is any intervention that they can offer him.  No sign of infection today.  The tip of the toe is not tender at all. - Ambulatory referral to Podiatry  2. Controlled type 2 diabetes mellitus with stage 3 chronic kidney disease, without long-term current use of insulin ([place]) Will discuss changing medications again as the Trulicity is also expensive.  He will check with pharmacy about Rybelsus what this cost.   No follow-ups on file.         Ynez Eugenio Cranford Mon, MD  [facility] [phone] (phone) [phone] (fax)  [place]

## 2020-03-02 NOTE — Progress Notes (Deleted)
    Trena Platt Ande Therrell,acting as a scribe for [name], MD.,have documented all relevant documentation on the behalf of [name], MD,as directed by  [name], MD while in the presence of [name], MD.   Established patient visit   Patient: Manuel Stafford.   DOB: 1945-08-31   74 y.o. Male  MRN: [id number] Visit Date: 03/02/2020  Today's healthcare provider: [name], MD   No chief complaint on file.  Subjective    HPI   Patient presents today complaining of a bad toenail  {Show patient history (optional):23778::" "}   Medications: Outpatient Medications Prior to Visit  Medication Sig  . aspirin 81 MG tablet Take 81 mg by mouth daily.   . cetirizine (ZYRTEC) 10 MG tablet TAKE 1 TABLET BY MOUTH EVERY DAY  . Cholecalciferol (VITAMIN D3) 2000 UNITS capsule Take 2,000 Units by mouth daily.  . diphenhydrAMINE (BENADRYL ALLERGY) 25 mg capsule Take by mouth.   . Dulaglutide (TRULICITY) 1.5 ER/1.5QM SOPN Inject 0.5 mLs (1.5 mg total) into the skin once a week.  . hydrochlorothiazide (HYDRODIURIL) 25 MG tablet TAKE 1 TABLET BY MOUTH EVERY DAY  . meclizine (ANTIVERT) 25 MG tablet Take 2 tablets (50 mg total) by mouth 3 (three) times daily as needed for dizziness.  . metFORMIN (GLUCOPHAGE) 1000 MG tablet TAKE 1 TABLET (1,000 MG TOTAL) BY MOUTH 2 (TWO) TIMES DAILY WITH A MEAL.  . MULTIPLE VITAMIN PO Take by mouth daily.   [name] omeprazole (PRILOSEC) 20 MG capsule TAKE 1 CAPSULE BY MOUTH EVERY DAY  . rOPINIRole (REQUIP) 0.25 MG tablet Take by mouth daily.  . rosuvastatin (CRESTOR) 20 MG tablet TAKE 1 TABLET BY MOUTH EVERY DAY  . Saw Palmetto, Serenoa repens, (SAW PALMETTO BERRY) 160 MG CAPS Take 2 tablets by mouth daily.   . Semaglutide,0.25 or 0.5MG /DOS, (OZEMPIC, 0.25 OR 0.5 MG/DOSE,) 2 MG/1.5ML SOPN Inject 0.5 mg into the skin once a week. (Patient not taking: Reported on 02/09/2020)  . sildenafil (REVATIO) 20 MG tablet Take 1-5 tablets by  mouth daily as needed.  . tadalafil (CIALIS) 20 MG tablet Take 0.5-1 tablets (10-20 mg total) by mouth every other day as needed for erectile dysfunction.  [name] telmisartan (MICARDIS) 40 MG tablet TAKE 1 TABLET BY MOUTH EVERY DAY  . tiZANidine (ZANAFLEX) 4 MG tablet TAKE 1 TABLET (4 MG TOTAL) BY MOUTH AT BEDTIME.  . traMADol (ULTRAM) 50 MG tablet TAKE 1 TABLET BY MOUTH 3 TIMES A DAY AS NEEDED FOR BACK OR LEG PAIN   No facility-administered medications prior to visit.    Review of Systems  Constitutional: Negative for appetite change, chills and fever.  Respiratory: Negative for chest tightness, shortness of breath and wheezing.   Cardiovascular: Negative for chest pain and palpitations.  Gastrointestinal: Negative for abdominal pain, nausea and vomiting.    {Heme  Chem  Endocrine  Serology  Results Review (optional):23779::" "}  Objective    There were no vitals taken for this visit. {Show previous vital signs (optional):23777::" "}  Physical Exam  ***  No results found for any visits on 03/02/20.  Assessment & Plan     ***  No follow-ups on file.      {provider attestation***:1}   [name], MD  [facility] [phone] (phone) [phone] (fax)  [place]

## 2020-03-03 ENCOUNTER — Ambulatory Visit (INDEPENDENT_AMBULATORY_CARE_PROVIDER_SITE_OTHER): Payer: PPO | Admitting: Family Medicine

## 2020-03-03 ENCOUNTER — Encounter: Payer: Self-pay | Admitting: Family Medicine

## 2020-03-03 ENCOUNTER — Other Ambulatory Visit: Payer: Self-pay

## 2020-03-03 VITALS — BP 132/75 | HR 81 | Temp 97.7°F | Ht 72.0 in | Wt 247.4 lb

## 2020-03-03 DIAGNOSIS — E1122 Type 2 diabetes mellitus with diabetic chronic kidney disease: Secondary | ICD-10-CM

## 2020-03-03 DIAGNOSIS — M79675 Pain in left toe(s): Secondary | ICD-10-CM

## 2020-03-03 DIAGNOSIS — N183 Chronic kidney disease, stage 3 unspecified: Secondary | ICD-10-CM | POA: Diagnosis not present

## 2020-03-03 NOTE — Patient Instructions (Signed)
CHECK ON RYBELSUS WITH PHARMACY!!!

## 2020-03-07 ENCOUNTER — Ambulatory Visit: Payer: PPO | Admitting: Dermatology

## 2020-03-07 ENCOUNTER — Other Ambulatory Visit: Payer: Self-pay

## 2020-03-07 ENCOUNTER — Encounter: Payer: Self-pay | Admitting: Dermatology

## 2020-03-07 DIAGNOSIS — B353 Tinea pedis: Secondary | ICD-10-CM

## 2020-03-07 DIAGNOSIS — L814 Other melanin hyperpigmentation: Secondary | ICD-10-CM | POA: Diagnosis not present

## 2020-03-07 DIAGNOSIS — L818 Other specified disorders of pigmentation: Secondary | ICD-10-CM

## 2020-03-07 DIAGNOSIS — L820 Inflamed seborrheic keratosis: Secondary | ICD-10-CM | POA: Diagnosis not present

## 2020-03-07 DIAGNOSIS — D485 Neoplasm of uncertain behavior of skin: Secondary | ICD-10-CM

## 2020-03-07 DIAGNOSIS — D229 Melanocytic nevi, unspecified: Secondary | ICD-10-CM | POA: Diagnosis not present

## 2020-03-07 DIAGNOSIS — I8310 Varicose veins of unspecified lower extremity with inflammation: Secondary | ICD-10-CM | POA: Diagnosis not present

## 2020-03-07 DIAGNOSIS — L578 Other skin changes due to chronic exposure to nonionizing radiation: Secondary | ICD-10-CM | POA: Diagnosis not present

## 2020-03-07 DIAGNOSIS — D235 Other benign neoplasm of skin of trunk: Secondary | ICD-10-CM

## 2020-03-07 DIAGNOSIS — D1800 Hemangioma unspecified site: Secondary | ICD-10-CM | POA: Diagnosis not present

## 2020-03-07 DIAGNOSIS — Z1283 Encounter for screening for malignant neoplasm of skin: Secondary | ICD-10-CM | POA: Diagnosis not present

## 2020-03-07 DIAGNOSIS — L821 Other seborrheic keratosis: Secondary | ICD-10-CM

## 2020-03-07 DIAGNOSIS — I781 Nevus, non-neoplastic: Secondary | ICD-10-CM | POA: Diagnosis not present

## 2020-03-07 MED ORDER — KETOCONAZOLE 2 % EX CREA: TOPICAL_CREAM | CUTANEOUS | 1 refills | Status: DC

## 2020-03-07 NOTE — Progress Notes (Addendum)
Follow-Up Visit   Subjective  Manuel Stafford. is a 74 y.o. male who presents for the following: Annual Exam (patient has noticed a lesion on his left face that he would like checked - he has noticed it's changing in appearence). The patient presents for Total-Body Skin Exam (TBSE) for skin cancer screening and mole check.  The following portions of the chart were reviewed this encounter and updated as appropriate:  Tobacco  Allergies  Meds  Problems  Med Hx  Surg Hx  Fam Hx     Review of Systems:  No other skin or systemic complaints except as noted in HPI or Assessment and Plan.  Objective  Well appearing patient in no apparent distress; mood and affect are within normal limits.  A full examination was performed including scalp, head, eyes, ears, nose, lips, neck, chest, axillae, abdomen, back, buttocks, bilateral upper extremities, bilateral lower extremities, hands, feet, fingers, toes, fingernails, and toenails. All findings within normal limits unless otherwise noted below.  Objective  L sideburn: Erythematous keratotic or waxy stuck-on papule or plaque.   Objective  B/L foot: Scale of the feet  Objective  Nose: Dilated blood vessels  Objective  R mid side: Pink patch 1.5 x 0.6 cm   Objective  R post shoulder: Blue macule  Assessment & Plan  Inflamed seborrheic keratosis L sideburn Recheck in 6 weeks - if not improved or resolved recommended biopsy   Destruction of lesion - L sideburn Complexity: simple   Destruction method: cryotherapy   Informed consent: discussed and consent obtained   Timeout:  patient name, date of birth, surgical site, and procedure verified Lesion destroyed using liquid nitrogen: Yes   Region frozen until ice ball extended beyond lesion: Yes   Outcome: patient tolerated procedure well with no complications   Post-procedure details: wound care instructions given    Tinea pedis of both feet B/L foot Start Ketoconazole 2%  cream QHS to the feet and between the toes  ketoconazole (NIZORAL) 2 % cream - B/L foot  Telangiectasia Nose Benign, observe. Discussed BBL laser as a tx option.   Neoplasm of uncertain behavior of skin R mid side  Skin / nail biopsy Type of biopsy: tangential   Informed consent: discussed and consent obtained   Timeout: patient name, date of birth, surgical site, and procedure verified   Procedure prep:  Patient was prepped and draped in usual sterile fashion Prep type:  Isopropyl alcohol Anesthesia: the lesion was anesthetized in a standard fashion   Anesthetic:  1% lidocaine w/ epinephrine 1-100,000 buffered w/ 8.4% NaHCO3 Instrument used: flexible razor blade   Hemostasis achieved with: pressure, aluminum chloride and electrodesiccation   Outcome: patient tolerated procedure well   Post-procedure details: sterile dressing applied and wound care instructions given   Dressing type: bandage and petrolatum    Specimen 1 - Surgical pathology Differential Diagnosis: r/o BCC vs other D48.5  Check Margins: No Pink patch  Tattoo R post shoulder Graphite tattoo - from the 3rd grade Benign, observe   Lentigines - Scattered tan macules - Discussed due to sun exposure - Benign, observe - Call for any changes  Seborrheic Keratoses - Stuck-on, waxy, tan-brown papules and plaques  - Discussed benign etiology and prognosis. - Observe - Call for any changes  Melanocytic Nevi - Tan-brown and/or pink-flesh-colored symmetric macules and papules - Benign appearing on exam today - Observation - Call clinic for new or changing moles - Recommend daily use of broad spectrum spf  30+ sunscreen to sun-exposed areas.   Hemangiomas - Red papules - Discussed benign nature - Observe - Call for any changes  Actinic Damage - diffuse scaly erythematous macules with underlying dyspigmentation - Recommend daily broad spectrum sunscreen SPF 30+ to sun-exposed areas, reapply every 2 hours  as needed.  - Call for new or changing lesions.  Varicose Veins - Dilated blue, purple or red veins at the lower extremities - Reassured - These can be treated by sclerotherapy (a procedure to inject a medicine into the veins to make them disappear) if desired, but the treatment is not covered by insurance   Skin cancer screening performed today.   Return in about 6 weeks (around 04/18/2020).  Luther Redo, CMA, am acting as scribe for Sarina Ser, MD .  Documentation: I have reviewed the above documentation for accuracy and completeness, and I agree with the above.  Sarina Ser, MD

## 2020-03-07 NOTE — Patient Instructions (Signed)

## 2020-03-08 NOTE — Progress Notes (Signed)
Subjective:   Manuel Stafford. is a 74 y.o. male who presents for Medicare Annual/Subsequent preventive examination.  Review of Systems    N/A  Cardiac Risk Factors include: advanced age (>45men, >69 women);diabetes mellitus;dyslipidemia;hypertension;male gender;obesity (BMI >30kg/m2)     Objective:    Today's Vitals   03/14/20 0918  PainSc: 6    There is no height or weight on file to calculate BMI.  Advanced Directives 03/14/2020 03/10/2019 09/23/2017 08/22/2016 07/24/2016 09/18/2015 09/06/2015  Does Patient Have a Medical Advance Directive? Yes Yes Yes No Yes Yes Yes  Type of Paramedic of [place];Living will Living will Living will - Living will Living will -  Does patient want to make changes to medical advance directive? - - - - - No - Patient declined -  Copy of [place] in Chart? No - copy requested - - - - Yes -    Current Medications (verified) Outpatient Encounter Medications as of 03/14/2020  Medication Sig  . aspirin 81 MG tablet Take 81 mg by mouth daily.   . cetirizine (ZYRTEC) 10 MG tablet TAKE 1 TABLET BY MOUTH EVERY DAY  . Cholecalciferol (VITAMIN D3) 2000 UNITS capsule Take 2,000 Units by mouth daily.   . diphenhydrAMINE (BENADRYL ALLERGY) 25 mg capsule Take by mouth.   . Dulaglutide (TRULICITY) 1.5 WY/6.3ZC SOPN Inject 0.5 mLs (1.5 mg total) into the skin once a week.  . hydrochlorothiazide (HYDRODIURIL) 25 MG tablet TAKE 1 TABLET BY MOUTH EVERY DAY  . ketoconazole (NIZORAL) 2 % cream Apply to feet and between toes QHS.  [name] meclizine (ANTIVERT) 25 MG tablet Take 2 tablets (50 mg total) by mouth 3 (three) times daily as needed for dizziness.  . metFORMIN (GLUCOPHAGE) 1000 MG tablet TAKE 1 TABLET (1,000 MG TOTAL) BY MOUTH 2 (TWO) TIMES DAILY WITH A MEAL.  . MULTIPLE VITAMIN PO Take by mouth daily.   [name] omeprazole (PRILOSEC) 20 MG capsule TAKE 1 CAPSULE BY MOUTH EVERY DAY  . rOPINIRole (REQUIP) 0.25 MG tablet Take by  mouth daily.   . rosuvastatin (CRESTOR) 20 MG tablet TAKE 1 TABLET BY MOUTH EVERY DAY  . Saw Palmetto, Serenoa repens, (SAW PALMETTO BERRY) 160 MG CAPS Take 2 tablets by mouth daily.   . Semaglutide,0.25 or 0.5MG /DOS, (OZEMPIC, 0.25 OR 0.5 MG/DOSE,) 2 MG/1.5ML SOPN Inject 0.5 mg into the skin once a week.  . tadalafil (CIALIS) 20 MG tablet Take 0.5-1 tablets (10-20 mg total) by mouth every other day as needed for erectile dysfunction.  [name] telmisartan (MICARDIS) 40 MG tablet TAKE 1 TABLET BY MOUTH EVERY DAY  . tiZANidine (ZANAFLEX) 4 MG tablet TAKE 1 TABLET (4 MG TOTAL) BY MOUTH AT BEDTIME.  . traMADol (ULTRAM) 50 MG tablet TAKE 1 TABLET BY MOUTH 3 TIMES A DAY AS NEEDED FOR BACK OR LEG PAIN  . sildenafil (REVATIO) 20 MG tablet Take 1-5 tablets by mouth daily as needed. (Patient not taking: Reported on 03/03/2020)   No facility-administered encounter medications on file as of 03/14/2020.    Allergies (verified) Patient has no known allergies.   History: Past Medical History:  Diagnosis Date  . Back pain    lower  . Diabetes mellitus without complication [facility])    Patient takes Metformin.  . Elevated lipids   . GERD (gastroesophageal reflux disease)   . Hemorrhoids 2017  . Hyperlipidemia   . Hypertension   . Kidney stone 2017  . Parkinsonian features    Past Surgical History:  Procedure Laterality Date  . CIRCUMCISION    . COLONOSCOPY  2007  . COLONOSCOPY WITH PROPOFOL N/A 08/22/2016   Procedure: COLONOSCOPY WITH PROPOFOL;  Surgeon: Christene Lye, MD;  Location: [facility] ENDOSCOPY;  Service: Endoscopy;  Laterality: N/A;  . CYSTOSCOPY WITH STENT PLACEMENT Right 09/18/2015   Procedure: CYSTOSCOPY WITH STENT PLACEMENT;  Surgeon: Hollice Espy, MD;  Location: [facility] ORS;  Service: Urology;  Laterality: Right;  . CYSTOSCOPY WITH URETEROSCOPY, STONE BASKETRY AND STENT PLACEMENT Right 10/03/2015   Procedure: CYSTOSCOPY WITH URETEROSCOPY, STONE BASKETRY AND STENT PLACEMENT;  Surgeon: Hollice Espy, MD;  Location: [facility] ORS;  Service: Urology;  Laterality: Right;  . EXTRACORPOREAL SHOCK WAVE LITHOTRIPSY Right 09/15/2015   Procedure: EXTRACORPOREAL SHOCK WAVE LITHOTRIPSY (ESWL);  Surgeon: Nickie Retort, MD;  Location: [facility] ORS;  Service: Urology;  Laterality: Right;  . HERNIA REPAIR    . ROTATOR CUFF REPAIR Right 06/2012   Family History  Problem Relation Age of Onset  . Hypertension Mother   . Diabetes Father   . CAD Father   . Diabetes Sister   . Heart disease Sister   . CAD Sister    Social History   Socioeconomic History  . Marital status: Married    Spouse name: Not on file  . Number of children: 2  . Years of education: Not on file  . Highest education level: Associate degree: occupational, Hotel manager, or vocational program  Occupational History  . Occupation: retired  Tobacco Use  . Smoking status: Former Smoker    Packs/day: 2.00    Years: 15.00    Pack years: 30.00    Types: Cigarettes    Quit date: 06/25/1974    Years since quitting: 45.7  . Smokeless tobacco: Never Used  Vaping Use  . Vaping Use: Never used  Substance and Sexual Activity  . Alcohol use: No  . Drug use: No  . Sexual activity: Not Currently  Other Topics Concern  . Not on file  Social History Narrative  . Not on file   Social Determinants of Health   Financial Resource Strain: Low Risk   . Difficulty of Paying Living Expenses: Not hard at all  Food Insecurity: No Food Insecurity  . Worried About Charity fundraiser in the Last Year: Never true  . Ran Out of Food in the Last Year: Never true  Transportation Needs: No Transportation Needs  . Lack of Transportation (Medical): No  . Lack of Transportation (Non-Medical): No  Physical Activity: Sufficiently Active  . Days of Exercise per Week: 5 days  . Minutes of Exercise per Session: 30 min  Stress: No Stress Concern Present  . Feeling of Stress : Not at all  Social Connections: Socially Integrated  . Frequency of  Communication with Friends and Family: More than three times a week  . Frequency of Social Gatherings with Friends and Family: More than three times a week  . Attends Religious Services: More than 4 times per year  . Active Member of Clubs or Organizations: Yes  . Attends Archivist Meetings: More than 4 times per year  . Marital Status: Married    Tobacco Counseling Counseling given: Not Answered   Clinical Intake:  Pre-visit preparation completed: Yes  Pain : 0-10 Pain Score: 6  Pain Type: Acute pain Pain Location: Toe (Comment which one) (Left big toe) Pain Orientation: Left Pain Descriptors / Indicators: Aching Pain Frequency: Intermittent Pain Relieving Factors: No taking any medications at this time. Has an  apt with a podiatrist on 03/16/20.  Pain Relieving Factors: No taking any medications at this time. Has an apt with a podiatrist on 03/16/20.  Nutritional Risks: None Diabetes: Yes  How often do you need to have someone help you when you read instructions, pamphlets, or other written materials from your doctor or pharmacy?: 1 - Never  Diabetic? Yes  Nutrition Risk Assessment:  Has the patient had any N/V/D within the last 2 months?  No  Does the patient have any non-healing wounds?  No  Has the patient had any unintentional weight loss or weight gain?  No   Diabetes:  Is the patient diabetic?  Yes  If diabetic, was a CBG obtained today?  No  Did the patient bring in their glucometer from home?  No  How often do you monitor your CBG's? Once daily.   Financial Strains and Diabetes Management:  Are you having any financial strains with the device, your supplies or your medication? No .  Does the patient want to be seen by Chronic Care Management for management of their diabetes?  No  Would the patient like to be referred to a Nutritionist or for Diabetic Management?  No   Diabetic Exams:  Diabetic Eye Exam: Completed 04/20/19 Diabetic Foot Exam:  Completed 07/13/19   Interpreter Needed?: No  Information entered by :: [facility], LPN   Activities of Daily Living In your present state of health, do you have any difficulty performing the following activities: 03/14/2020  Hearing? N  Vision? N  Difficulty concentrating or making decisions? N  Walking or climbing stairs? N  Dressing or bathing? N  Doing errands, shopping? N  Preparing Food and eating ? N  Using the Toilet? N  In the past six months, have you accidently leaked urine? N  Do you have problems with loss of bowel control? N  Managing your Medications? N  Managing your Finances? N  Housekeeping or managing your Housekeeping? N  Some recent data might be hidden    Patient Care Team: [name], MD as PCP - General (Family Medicine) Thelma Comp, [place] as Consulting Physician (Optometry) Corey Skains, MD as Consulting Physician (Cardiology) Ralene Bathe, MD as Consulting Physician (Dermatology) Hollice Espy, MD as Consulting Physician (Urology) Barbette Merino, NP as Nurse Practitioner (Nurse Practitioner) Vladimir Crofts, MD as Consulting Physician (Neurology) Gayland Curry [facility] [place] (Neurology) Caroline More, DPM as Consulting Physician (Podiatry)  Indicate any recent Medical Services you may have received from other than [facility] providers in the past year (date may be approximate).     Assessment:   This is a routine wellness examination for [place].  Hearing/Vision screen No exam data present  Dietary issues and exercise activities discussed: Current Exercise Habits: Home exercise routine, Type of exercise: treadmill, Time (Minutes): 30, Frequency (Times/Week): 5, Weekly Exercise (Minutes/Week): 150, Intensity: Moderate, Exercise limited by: None identified  Goals    . DIET - INCREASE WATER INTAKE     Recommend to drink at least 6-8 8oz glasses of water per day.    . Prevent falls     Recommend to remove any items from the  home that may cause slips or trips.      Depression Screen PHQ 2/9 Scores 03/14/2020 03/10/2019 09/23/2017 08/13/2017 07/24/2016 02/07/2015  PHQ - 2 Score 0 0 0 0 0 0    Fall Risk Fall Risk  03/14/2020 02/09/2020 03/10/2019 01/26/2019 09/23/2017  Falls in the past year? 1 0 0  0 No  Comment - - - Emmi Telephone Survey: data to providers prior to load -  Number falls in past yr: 0 0 0 - -  Injury with Fall? 0 0 0 - -  Follow up Falls prevention discussed - - - -    Any stairs in or around the home? Yes  If so, are there any without handrails? No  Home free of loose throw rugs in walkways, pet beds, electrical cords, etc? Yes  Adequate lighting in your home to reduce risk of falls? Yes   ASSISTIVE DEVICES UTILIZED TO PREVENT FALLS:  Life alert? No  Use of a cane, walker or w/c? No  Grab bars in the bathroom? Yes  Shower chair or bench in shower? Yes  Elevated toilet seat or a handicapped toilet? No    Cognitive Function: Declined today.      6CIT Screen 07/24/2016  What Year? 0 points  What month? 0 points  What time? 0 points  Count back from 20 0 points  Months in reverse 0 points  Repeat phrase 2 points  Total Score 2    Immunizations Immunization History  Administered Date(s) Administered  . Fluad Quad(high Dose 65+) 03/11/2019  . Hepatitis A 08/13/2006  . Influenza, High Dose Seasonal PF 03/23/2015, 03/21/2016, 03/21/2017, 04/09/2018  . PFIZER SARS-COV-2 Vaccination 08/07/2019, 09/01/2019  . Pneumococcal Conjugate-13 05/25/2014  . Pneumococcal Polysaccharide-23 04/08/2012  . Tdap 10/26/2005  . Zoster 08/13/2006, 04/17/2013    TDAP status: Due, Education has been provided regarding the importance of this vaccine. Advised may receive this vaccine at local pharmacy or Health Dept. Aware to provide a copy of the vaccination record if obtained from local pharmacy or Health Dept. Verbalized acceptance and understanding. Flu Vaccine status: Declined, Education has been provided  regarding the importance of this vaccine but patient still declined. Advised may receive this vaccine at local pharmacy or Health Dept. Aware to provide a copy of the vaccination record if obtained from local pharmacy or Health Dept. Verbalized acceptance and understanding. Pneumococcal vaccine status: Up to date Covid-19 vaccine status: Completed vaccines  Qualifies for Shingles Vaccine? Yes   Zostavax completed Yes   Shingrix Completed?: No.    Education has been provided regarding the importance of this vaccine. Patient has been advised to call insurance company to determine out of pocket expense if they have not yet received this vaccine. Advised may also receive vaccine at local pharmacy or Health Dept. Verbalized acceptance and understanding.  Screening Tests Health Maintenance  Topic Date Due  . INFLUENZA VACCINE  01/24/2020  . TETANUS/TDAP  03/14/2021 (Originally 10/27/2015)  . OPHTHALMOLOGY EXAM  04/19/2020  . FOOT EXAM  07/12/2020  . HEMOGLOBIN A1C  08/11/2020  . COLONOSCOPY  08/22/2026  . COVID-19 Vaccine  Completed  . Hepatitis C Screening  Completed  . PNA vac Low Risk Adult  Completed    Health Maintenance  Health Maintenance Due  Topic Date Due  . INFLUENZA VACCINE  01/24/2020    Colorectal cancer screening: Completed 08/22/16. Repeat every 10 years  Lung Cancer Screening: (Low Dose CT Chest recommended if Age 36-80 years, 30 pack-year currently smoking OR have quit w/in 15years.) does not qualify.    Additional Screening:  Hepatitis C Screening: Up to date  Vision Screening: Recommended annual ophthalmology exams for early detection of glaucoma and other disorders of the eye. Is the patient up to date with their annual eye exam?  Yes  Who is the provider or what is  the name of the office in which the patient attends annual eye exams? Dr Rick Duff If pt is not established with a provider, would they like to be referred to a provider to establish care? No .    Dental Screening: Recommended annual dental exams for proper oral hygiene  Community Resource Referral / Chronic Care Management: CRR required this visit?  No   CCM required this visit?  No      Plan:     I have personally reviewed and noted the following in the patient's chart:   . Medical and social history . Use of alcohol, tobacco or illicit drugs  . Current medications and supplements . Functional ability and status . Nutritional status . Physical activity . Advanced directives . List of other physicians . Hospitalizations, surgeries, and ER visits in previous 12 months . Vitals . Screenings to include cognitive, depression, and falls . Referrals and appointments  In addition, I have reviewed and discussed with patient certain preventive protocols, quality metrics, and best practice recommendations. A written personalized care plan for preventive services as well as general preventive health recommendations were provided to patient.     Geneviene Tesch [place], [place]   10/09/3843   Nurse Notes: Pt would like to receive the flu shot at today's in office apt.

## 2020-03-10 NOTE — Addendum Note (Signed)
Addended by: Ralene Bathe on: 03/10/2020 01:04 PM   Modules accepted: Level of Service

## 2020-03-11 NOTE — Progress Notes (Signed)
Trena Platt Aemilia Dedrick,acting as a scribe for [name], MD.,have documented all relevant documentation on the behalf of [name], MD,as directed by  [name], MD while in the presence of [name], MD.  Complete physical exam   Patient: Manuel Stafford.   DOB: [birth year]   74 y.o. Male  MRN: [id number] Visit Date: 03/14/2020  Today's healthcare provider: [name], MD   Chief Complaint  Patient presents with   Annual Exam   Subjective    Manuel Stafford. is a 74 y.o. male who presents today for a complete physical exam.  He reports consuming a general diet. Home exercise routine includes walking 30 mins, daily. He generally feels fairly well. He reports sleeping fairly well. He does not have additional problems to discuss today.   HPI   Patient had AWV with NHA today at 9:20 am.  Past Medical History:  Diagnosis Date   Back pain    lower   Diabetes mellitus without complication ([place])    Patient takes Metformin.   Elevated lipids    GERD (gastroesophageal reflux disease)    Hemorrhoids 2017   Hyperlipidemia    Hypertension    Kidney stone 2017   Parkinsonian features    Past Surgical History:  Procedure Laterality Date   CIRCUMCISION     COLONOSCOPY  2007   COLONOSCOPY WITH PROPOFOL N/A 08/22/2016   Procedure: COLONOSCOPY WITH PROPOFOL;  Surgeon: Christene Lye, MD;  Location: [facility];  Service: Endoscopy;  Laterality: N/A;   CYSTOSCOPY WITH STENT PLACEMENT Right 09/18/2015   Procedure: CYSTOSCOPY WITH STENT PLACEMENT;  Surgeon: Hollice Espy, MD;  Location: [facility];  Service: Urology;  Laterality: Right;   CYSTOSCOPY WITH URETEROSCOPY, STONE BASKETRY AND STENT PLACEMENT Right 10/03/2015   Procedure: CYSTOSCOPY WITH URETEROSCOPY, STONE BASKETRY AND STENT PLACEMENT;  Surgeon: Hollice Espy, MD;  Location: [facility];  Service: Urology;  Laterality: Right;   EXTRACORPOREAL SHOCK WAVE  LITHOTRIPSY Right 09/15/2015   Procedure: EXTRACORPOREAL SHOCK WAVE LITHOTRIPSY (ESWL);  Surgeon: Nickie Retort, MD;  Location: [facility];  Service: Urology;  Laterality: Right;   HERNIA REPAIR     ROTATOR CUFF REPAIR Right 06/2012   Social History   Socioeconomic History   Marital status: Married    Spouse name: Not on file   Number of children: 2   Years of education: Not on file   Highest education level: Associate degree: occupational, Hotel manager, or vocational program  Occupational History   Occupation: retired  Tobacco Use   Smoking status: Former Smoker    Packs/day: 2.00    Years: 15.00    Pack years: 30.00    Types: Cigarettes    Quit date: 06/25/1974    Years since quitting: 45.7   Smokeless tobacco: Never Used  Vaping Use   Vaping Use: Never used  Substance and Sexual Activity   Alcohol use: No   Drug use: No   Sexual activity: Not Currently  Other Topics Concern   Not on file  Social History Narrative   Not on file   Social Determinants of Health   Financial Resource Strain: Low Risk    Difficulty of Paying Living Expenses: Not hard at all  Food Insecurity: No Food Insecurity   Worried About Charity fundraiser in the Last Year: Never true   [place] in the Last Year: Never true  Transportation Needs: No Transportation Needs   Lack of  Transportation (Medical): No   Lack of Transportation (Non-Medical): No  Physical Activity: Sufficiently Active   Days of Exercise per Week: 5 days   Minutes of Exercise per Session: 30 min  Stress: No Stress Concern Present   Feeling of Stress : Not at all  Social Connections: Socially Integrated   Frequency of Communication with Friends and Family: More than three times a week   Frequency of Social Gatherings with Friends and Family: More than three times a week   Attends Religious Services: More than 4 times per year   Active Member of Clubs or Organizations: Yes   Attends Programme researcher, broadcasting/film/video: More than 4 times per year   Marital Status: Married  Human resources officer Violence: Not At Risk   Fear of Current or Ex-Partner: No   Emotionally Abused: No   Physically Abused: No   Sexually Abused: No   Family Status  Relation Name Status   Mother  Deceased at age 4   Father  Deceased at age 16       cardiac issues   Sister  Deceased at age 20       from complications of heart disease and DM   Family History  Problem Relation Age of Onset   Hypertension Mother    Diabetes Father    CAD Father    Diabetes Sister    Heart disease Sister    CAD Sister    No Known Allergies  Patient Care Team: [name], MD as PCP - General (Family Medicine) Thelma Comp, [place] as Consulting Physician (Optometry) Corey Skains, MD as Consulting Physician (Cardiology) Ralene Bathe, MD as Consulting Physician (Dermatology) Hollice Espy, MD as Consulting Physician (Urology) Barbette Merino, NP as Nurse Practitioner (Nurse Practitioner) Vladimir Crofts, MD as Consulting Physician (Neurology) Sunday Corn, Criss Alvine Hubbard Hartshorn (Neurology) Caroline More, DPM as Consulting Physician (Podiatry)   Medications: Outpatient Medications Prior to Visit  Medication Sig   aspirin 81 MG tablet Take 81 mg by mouth daily.    cetirizine (ZYRTEC) 10 MG tablet TAKE 1 TABLET BY MOUTH EVERY DAY   Cholecalciferol (VITAMIN D3) 2000 UNITS capsule Take 2,000 Units by mouth daily.    diphenhydrAMINE (BENADRYL ALLERGY) 25 mg capsule Take by mouth.    Dulaglutide (TRULICITY) 1.5 VP/7.1GG SOPN Inject 0.5 mLs (1.5 mg total) into the skin once a week.   hydrochlorothiazide (HYDRODIURIL) 25 MG tablet TAKE 1 TABLET BY MOUTH EVERY DAY   ketoconazole (NIZORAL) 2 % cream Apply to feet and between toes QHS.   meclizine (ANTIVERT) 25 MG tablet Take 2 tablets (50 mg total) by mouth 3 (three) times daily as needed for dizziness.   metFORMIN (GLUCOPHAGE) 1000 MG tablet  TAKE 1 TABLET (1,000 MG TOTAL) BY MOUTH 2 (TWO) TIMES DAILY WITH A MEAL.   MULTIPLE VITAMIN PO Take by mouth daily.    omeprazole (PRILOSEC) 20 MG capsule TAKE 1 CAPSULE BY MOUTH EVERY DAY   rOPINIRole (REQUIP) 0.25 MG tablet Take by mouth daily.    rosuvastatin (CRESTOR) 20 MG tablet TAKE 1 TABLET BY MOUTH EVERY DAY   Saw Palmetto, Serenoa repens, (SAW PALMETTO BERRY) 160 MG CAPS Take 2 tablets by mouth daily.    Semaglutide,0.25 or 0.5MG /DOS, (OZEMPIC, 0.25 OR 0.5 MG/DOSE,) 2 MG/1.5ML SOPN Inject 0.5 mg into the skin once a week.   tadalafil (CIALIS) 20 MG tablet Take 0.5-1 tablets (10-20 mg total) by mouth every other day as needed for erectile dysfunction.  telmisartan (MICARDIS) 40 MG tablet TAKE 1 TABLET BY MOUTH EVERY DAY   tiZANidine (ZANAFLEX) 4 MG tablet TAKE 1 TABLET (4 MG TOTAL) BY MOUTH AT BEDTIME.   traMADol (ULTRAM) 50 MG tablet TAKE 1 TABLET BY MOUTH 3 TIMES A DAY AS NEEDED FOR BACK OR LEG PAIN   sildenafil (REVATIO) 20 MG tablet Take 1-5 tablets by mouth daily as needed. (Patient not taking: Reported on 03/03/2020)   No facility-administered medications prior to visit.    Review of Systems  Constitutional: Negative.   HENT: Positive for congestion and sinus pressure.   Eyes: Negative.   Respiratory: Negative.   Cardiovascular: Negative.   Gastrointestinal: Negative.   Endocrine: Negative.   Genitourinary: Positive for urgency.  Musculoskeletal: Positive for back pain.  Skin: Negative.   Allergic/Immunologic: Negative.   Neurological: Positive for tremors.  Hematological: Bruises/bleeds easily.  Psychiatric/Behavioral: Negative.     Last hemoglobin A1c Lab Results  Component Value Date   HGBA1C 7.1 (A) 02/09/2020      Objective    BP 129/72 (BP Location: Right Arm, Patient Position: Sitting, Cuff Size: Large)    Pulse 93    Temp 98 F (36.7 C) (Oral)    Ht 6' (1.829 m)    Wt 247 lb 3.2 oz (112.1 kg)    BMI 33.53 kg/m  BP Readings from Last 3  Encounters:  03/14/20 129/72  03/03/20 132/75  02/09/20 (!) 142/86   Wt Readings from Last 3 Encounters:  03/14/20 247 lb 3.2 oz (112.1 kg)  03/03/20 247 lb 6.4 oz (112.2 kg)  02/09/20 245 lb 6.4 oz (111.3 kg)      Physical Exam Vitals reviewed.  Constitutional:      Appearance: He is well-developed. He is obese.  HENT:     Head: Normocephalic and atraumatic.     Right Ear: External ear normal.     Left Ear: External ear normal.     Nose: Nose normal.  Eyes:     Conjunctiva/sclera: Conjunctivae normal.     Pupils: Pupils are equal, round, and reactive to light.  Cardiovascular:     Rate and Rhythm: Normal rate and regular rhythm.     Heart sounds: Normal heart sounds.  Pulmonary:     Effort: Pulmonary effort is normal.     Breath sounds: Normal breath sounds.  Abdominal:     General: Bowel sounds are normal.     Palpations: Abdomen is soft.  Genitourinary:    Penis: Normal.      Testes: Normal.     Prostate: Normal.     Rectum: Normal.  Musculoskeletal:        General: Normal range of motion.     Cervical back: Normal range of motion and neck supple.  Skin:    General: Skin is warm and dry.  Neurological:     General: No focal deficit present.     Mental Status: He is alert and oriented to person, place, and time.  Psychiatric:        Mood and Affect: Mood normal.        Behavior: Behavior normal.        Thought Content: Thought content normal.        Judgment: Judgment normal.       Last depression screening scores PHQ 2/9 Scores 03/14/2020 03/10/2019 09/23/2017  PHQ - 2 Score 0 0 0   Last fall risk screening Fall Risk  03/14/2020  Falls in the past year? 1  Comment -  Number falls in past yr: 0  Injury with Fall? 0  Follow up Falls prevention discussed   Last Audit-C alcohol use screening Alcohol Use Disorder Test (AUDIT) 03/14/2020  1. How often do you have a drink containing alcohol? 0  2. How many drinks containing alcohol do you have on a typical  day when you are drinking? 0  3. How often do you have six or more drinks on one occasion? 0  AUDIT-C Score 0  Alcohol Brief Interventions/Follow-up AUDIT Score <7 follow-up not indicated   A score of 3 or more in women, and 4 or more in men indicates increased risk for alcohol abuse, EXCEPT if all of the points are from question 1   No results found for any visits on 03/14/20.  Assessment & Plan    Routine Health Maintenance and Physical Exam  Exercise Activities and Dietary recommendations Goals     DIET - INCREASE WATER INTAKE     Recommend to drink at least 6-8 8oz glasses of water per day.     Prevent falls     Recommend to remove any items from the home that may cause slips or trips.       Immunization History  Administered Date(s) Administered   Fluad Quad(high Dose 65+) 03/11/2019   Hepatitis A 08/13/2006   Influenza, High Dose Seasonal PF 03/23/2015, 03/21/2016, 03/21/2017, 04/09/2018   PFIZER SARS-COV-2 Vaccination 08/07/2019, 09/01/2019   Pneumococcal Conjugate-13 05/25/2014   Pneumococcal Polysaccharide-23 04/08/2012   Tdap 10/26/2005   Zoster 08/13/2006, 04/17/2013    Health Maintenance  Topic Date Due   INFLUENZA VACCINE  01/24/2020   TETANUS/TDAP  03/14/2021 (Originally 10/27/2015)   OPHTHALMOLOGY EXAM  04/19/2020   FOOT EXAM  07/12/2020   HEMOGLOBIN A1C  08/11/2020   COLONOSCOPY  08/22/2026   COVID-19 Vaccine  Completed   Hepatitis C Screening  Completed   PNA vac Low Risk Adult  Completed    Discussed health benefits of physical activity, and encouraged him to engage in regular exercise appropriate for his age and condition.  1. Annual physical exam   2. Need for influenza vaccination  - Flu Vaccine QUAD High Dose(Fluad)  3. Type 2 diabetes mellitus with other specified complication, without long-term current use of insulin (HCC)  - CBC with Differential/Platelet - TSH - Lipid panel - Comprehensive metabolic panel  4.  Benign essential HTN  - CBC with Differential/Platelet - TSH - Lipid panel - Comprehensive metabolic panel  5. Obstructive sleep apnea syndrome  - CBC with Differential/Platelet - TSH - Lipid panel - Comprehensive metabolic panel  6. Class 1 obesity due to excess calories with serious comorbidity and body mass index (BMI) of 31.0 to 31.9 in adult  - CBC with Differential/Platelet - TSH - Lipid panel - Comprehensive metabolic panel  7. Other hyperlipidemia  - CBC with Differential/Platelet - TSH - Lipid panel - Comprehensive metabolic panel  8. Encounter for screening fecal occult blood testing  - IFOBT POC (occult bld, rslt in office); Future   No follow-ups on file.     I, [name], MD, have reviewed all documentation for this visit. The documentation on 03/17/20 for the exam, diagnosis, procedures, and orders are all accurate and complete.    Richard Cranford Mon, MD  [facility] [phone] (phone) [phone] (fax)  [place]

## 2020-03-14 ENCOUNTER — Other Ambulatory Visit: Payer: Self-pay

## 2020-03-14 ENCOUNTER — Ambulatory Visit (INDEPENDENT_AMBULATORY_CARE_PROVIDER_SITE_OTHER): Payer: PPO

## 2020-03-14 ENCOUNTER — Encounter: Payer: Self-pay | Admitting: Family Medicine

## 2020-03-14 ENCOUNTER — Ambulatory Visit (INDEPENDENT_AMBULATORY_CARE_PROVIDER_SITE_OTHER): Payer: PPO | Admitting: Family Medicine

## 2020-03-14 VITALS — BP 129/72 | HR 93 | Temp 98.0°F | Ht 72.0 in | Wt 247.2 lb

## 2020-03-14 DIAGNOSIS — Z6831 Body mass index (BMI) 31.0-31.9, adult: Secondary | ICD-10-CM | POA: Diagnosis not present

## 2020-03-14 DIAGNOSIS — G4733 Obstructive sleep apnea (adult) (pediatric): Secondary | ICD-10-CM

## 2020-03-14 DIAGNOSIS — Z1211 Encounter for screening for malignant neoplasm of colon: Secondary | ICD-10-CM

## 2020-03-14 DIAGNOSIS — Z0000 Encounter for general adult medical examination without abnormal findings: Secondary | ICD-10-CM

## 2020-03-14 DIAGNOSIS — E6609 Other obesity due to excess calories: Secondary | ICD-10-CM

## 2020-03-14 DIAGNOSIS — I10 Essential (primary) hypertension: Secondary | ICD-10-CM | POA: Diagnosis not present

## 2020-03-14 DIAGNOSIS — Z23 Encounter for immunization: Secondary | ICD-10-CM

## 2020-03-14 DIAGNOSIS — E7849 Other hyperlipidemia: Secondary | ICD-10-CM

## 2020-03-14 DIAGNOSIS — E1169 Type 2 diabetes mellitus with other specified complication: Secondary | ICD-10-CM | POA: Diagnosis not present

## 2020-03-14 NOTE — Patient Instructions (Signed)
Manuel Stafford , Thank you for taking time to come for your Medicare Wellness Visit. I appreciate your ongoing commitment to your health goals. Please review the following plan we discussed and let me know if I can assist you in the future.   Screening recommendations/referrals: Colonoscopy: Up to date, due 07/2026 Recommended yearly ophthalmology/optometry visit for glaucoma screening and checkup Recommended yearly dental visit for hygiene and checkup  Vaccinations: Influenza vaccine: Currently due. Pneumococcal vaccine: Completed series Tdap vaccine: Currently due, declined at this time.  Shingles vaccine: Shingrix discussed. Please contact your pharmacy for coverage information.     Advanced directives: Please bring a copy of your POA (Power of Attorney) and/or Living Will to your next appointment.   Conditions/risks identified: Fall risk preventatives discussed today.   Next appointment: 10:40 AM today with Dr Rosanna Randy   Preventive Care 31 Years and Older, Male Preventive care refers to lifestyle choices and visits with your health care provider that can promote health and wellness. What does preventive care include?  A yearly physical exam. This is also called an annual well check.  Dental exams once or twice a year.  Routine eye exams. Ask your health care provider how often you should have your eyes checked.  Personal lifestyle choices, including:  Daily care of your teeth and gums.  Regular physical activity.  Eating a healthy diet.  Avoiding tobacco and drug use.  Limiting alcohol use.  Practicing safe sex.  Taking low doses of aspirin every day.  Taking vitamin and mineral supplements as recommended by your health care provider. What happens during an annual well check? The services and screenings done by your health care provider during your annual well check will depend on your age, overall health, lifestyle risk factors, and family history of  disease. Counseling  Your health care provider may ask you questions about your:  Alcohol use.  Tobacco use.  Drug use.  Emotional well-being.  Home and relationship well-being.  Sexual activity.  Eating habits.  History of falls.  Memory and ability to understand (cognition).  Work and work Statistician. Screening  You may have the following tests or measurements:  Height, weight, and BMI.  Blood pressure.  Lipid and cholesterol levels. These may be checked every 5 years, or more frequently if you are over 58 years old.  Skin check.  Lung cancer screening. You may have this screening every year starting at age 73 if you have a 30-pack-year history of smoking and currently smoke or have quit within the past 15 years.  Fecal occult blood test (FOBT) of the stool. You may have this test every year starting at age 71.  Flexible sigmoidoscopy or colonoscopy. You may have a sigmoidoscopy every 5 years or a colonoscopy every 10 years starting at age 32.  Prostate cancer screening. Recommendations will vary depending on your family history and other risks.  Hepatitis C blood test.  Hepatitis B blood test.  Sexually transmitted disease (STD) testing.  Diabetes screening. This is done by checking your blood sugar (glucose) after you have not eaten for a while (fasting). You may have this done every 1-3 years.  Abdominal aortic aneurysm (AAA) screening. You may need this if you are a current or former smoker.  Osteoporosis. You may be screened starting at age 45 if you are at high risk. Talk with your health care provider about your test results, treatment options, and if necessary, the need for more tests. Vaccines  Your health care provider may  recommend certain vaccines, such as:  Influenza vaccine. This is recommended every year.  Tetanus, diphtheria, and acellular pertussis (Tdap, Td) vaccine. You may need a Td booster every 10 years.  Zoster vaccine. You may  need this after age 36.  Pneumococcal 13-valent conjugate (PCV13) vaccine. One dose is recommended after age 63.  Pneumococcal polysaccharide (PPSV23) vaccine. One dose is recommended after age 2. Talk to your health care provider about which screenings and vaccines you need and how often you need them. This information is not intended to replace advice given to you by your health care provider. Make sure you discuss any questions you have with your health care provider. Document Released: 07/08/2015 Document Revised: 02/29/2016 Document Reviewed: 04/12/2015 Elsevier Interactive Patient Education  2017 [place] Prevention in the Home Falls can cause injuries. They can happen to people of all ages. There are many things you can do to make your home safe and to help prevent falls. What can I do on the outside of my home?  Regularly fix the edges of walkways and driveways and fix any cracks.  Remove anything that might make you trip as you walk through a door, such as a raised step or threshold.  Trim any bushes or trees on the path to your home.  Use bright outdoor lighting.  Clear any walking paths of anything that might make someone trip, such as rocks or tools.  Regularly check to see if handrails are loose or broken. Make sure that both sides of any steps have handrails.  Any raised decks and porches should have guardrails on the edges.  Have any leaves, snow, or ice cleared regularly.  Use sand or salt on walking paths during winter.  Clean up any spills in your garage right away. This includes oil or grease spills. What can I do in the bathroom?  Use night lights.  Install grab bars by the toilet and in the tub and shower. Do not use towel bars as grab bars.  Use non-skid mats or decals in the tub or shower.  If you need to sit down in the shower, use a plastic, non-slip stool.  Keep the floor dry. Clean up any water that spills on the floor as soon as it  happens.  Remove soap buildup in the tub or shower regularly.  Attach bath mats securely with double-sided non-slip rug tape.  Do not have throw rugs and other things on the floor that can make you trip. What can I do in the bedroom?  Use night lights.  Make sure that you have a light by your bed that is easy to reach.  Do not use any sheets or blankets that are too big for your bed. They should not hang down onto the floor.  Have a firm chair that has side arms. You can use this for support while you get dressed.  Do not have throw rugs and other things on the floor that can make you trip. What can I do in the kitchen?  Clean up any spills right away.  Avoid walking on wet floors.  Keep items that you use a lot in easy-to-reach places.  If you need to reach something above you, use a strong step stool that has a grab bar.  Keep electrical cords out of the way.  Do not use floor polish or wax that makes floors slippery. If you must use wax, use non-skid floor wax.  Do not have throw rugs and  other things on the floor that can make you trip. What can I do with my stairs?  Do not leave any items on the stairs.  Make sure that there are handrails on both sides of the stairs and use them. Fix handrails that are broken or loose. Make sure that handrails are as long as the stairways.  Check any carpeting to make sure that it is firmly attached to the stairs. Fix any carpet that is loose or worn.  Avoid having throw rugs at the top or bottom of the stairs. If you do have throw rugs, attach them to the floor with carpet tape.  Make sure that you have a light switch at the top of the stairs and the bottom of the stairs. If you do not have them, ask someone to add them for you. What else can I do to help prevent falls?  Wear shoes that:  Do not have high heels.  Have rubber bottoms.  Are comfortable and fit you well.  Are closed at the toe. Do not wear sandals.  If you  use a stepladder:  Make sure that it is fully opened. Do not climb a closed stepladder.  Make sure that both sides of the stepladder are locked into place.  Ask someone to hold it for you, if possible.  Clearly mark and make sure that you can see:  Any grab bars or handrails.  First and last steps.  Where the edge of each step is.  Use tools that help you move around (mobility aids) if they are needed. These include:  Canes.  Walkers.  Scooters.  Crutches.  Turn on the lights when you go into a dark area. Replace any light bulbs as soon as they burn out.  Set up your furniture so you have a clear path. Avoid moving your furniture around.  If any of your floors are uneven, fix them.  If there are any pets around you, be aware of where they are.  Review your medicines with your doctor. Some medicines can make you feel dizzy. This can increase your chance of falling. Ask your doctor what other things that you can do to help prevent falls. This information is not intended to replace advice given to you by your health care provider. Make sure you discuss any questions you have with your health care provider. Document Released: 04/07/2009 Document Revised: 11/17/2015 Document Reviewed: 07/16/2014 Elsevier Interactive Patient Education  2017 [employer].

## 2020-03-15 ENCOUNTER — Telehealth: Payer: Self-pay

## 2020-03-15 NOTE — Telephone Encounter (Signed)
-----   Message from Ralene Bathe, MD sent at 03/14/2020  1:01 PM EDT ----- Skin , right mid side POROMA, BASE INVOLVED  Benign Poroma Will discuss at next appointment whether/  if pt wants to pursue surgery Keep 6 week follow up visit

## 2020-03-15 NOTE — Telephone Encounter (Signed)
Discussed biopsy results with pt  °

## 2020-03-16 DIAGNOSIS — E6609 Other obesity due to excess calories: Secondary | ICD-10-CM | POA: Diagnosis not present

## 2020-03-16 DIAGNOSIS — E1169 Type 2 diabetes mellitus with other specified complication: Secondary | ICD-10-CM | POA: Diagnosis not present

## 2020-03-16 DIAGNOSIS — L601 Onycholysis: Secondary | ICD-10-CM | POA: Diagnosis not present

## 2020-03-16 DIAGNOSIS — G4733 Obstructive sleep apnea (adult) (pediatric): Secondary | ICD-10-CM | POA: Diagnosis not present

## 2020-03-16 DIAGNOSIS — E7849 Other hyperlipidemia: Secondary | ICD-10-CM | POA: Diagnosis not present

## 2020-03-16 DIAGNOSIS — I10 Essential (primary) hypertension: Secondary | ICD-10-CM | POA: Diagnosis not present

## 2020-03-16 DIAGNOSIS — L603 Nail dystrophy: Secondary | ICD-10-CM | POA: Diagnosis not present

## 2020-03-16 DIAGNOSIS — E1142 Type 2 diabetes mellitus with diabetic polyneuropathy: Secondary | ICD-10-CM | POA: Diagnosis not present

## 2020-03-16 DIAGNOSIS — M79672 Pain in left foot: Secondary | ICD-10-CM | POA: Diagnosis not present

## 2020-03-16 DIAGNOSIS — Z6831 Body mass index (BMI) 31.0-31.9, adult: Secondary | ICD-10-CM | POA: Diagnosis not present

## 2020-03-17 LAB — CBC WITH DIFFERENTIAL/PLATELET
Basophils Absolute: 0 10*3/uL (ref 0.0–0.2)
Basos: 1 %
EOS (ABSOLUTE): 0.2 10*3/uL (ref 0.0–0.4)
Eos: 2 %
Hematocrit: 47.1 % (ref 37.5–51.0)
Hemoglobin: 15.3 g/dL (ref 13.0–17.7)
Immature Grans (Abs): 0 10*3/uL (ref 0.0–0.1)
Immature Granulocytes: 0 %
Lymphocytes Absolute: 2.8 10*3/uL (ref 0.7–3.1)
Lymphs: 34 %
MCH: 31.4 pg (ref 26.6–33.0)
MCHC: 32.5 g/dL (ref 31.5–35.7)
MCV: 97 fL (ref 79–97)
Monocytes Absolute: 0.8 10*3/uL (ref 0.1–0.9)
Monocytes: 10 %
Neutrophils Absolute: 4.2 10*3/uL (ref 1.4–7.0)
Neutrophils: 53 %
Platelets: 177 10*3/uL (ref 150–450)
RBC: 4.87 x10E6/uL (ref 4.14–5.80)
RDW: 12 % (ref 11.6–15.4)
WBC: 8 10*3/uL (ref 3.4–10.8)

## 2020-03-17 LAB — COMPREHENSIVE METABOLIC PANEL
ALT: 35 IU/L (ref 0–44)
AST: 38 IU/L (ref 0–40)
Albumin/Globulin Ratio: 1.7 (ref 1.2–2.2)
Albumin: 4.5 g/dL (ref 3.7–4.7)
Alkaline Phosphatase: 129 IU/L — ABNORMAL HIGH (ref 44–121)
BUN/Creatinine Ratio: 15 (ref 10–24)
BUN: 21 mg/dL (ref 8–27)
Bilirubin Total: 0.5 mg/dL (ref 0.0–1.2)
CO2: 19 mmol/L — ABNORMAL LOW (ref 20–29)
Calcium: 9.7 mg/dL (ref 8.6–10.2)
Chloride: 98 mmol/L (ref 96–106)
Creatinine, Ser: 1.44 mg/dL — ABNORMAL HIGH (ref 0.76–1.27)
GFR calc Af Amer: 55 mL/min/{1.73_m2} — ABNORMAL LOW (ref >59)
GFR calc non Af Amer: 47 mL/min/{1.73_m2} — ABNORMAL LOW (ref >59)
Globulin, Total: 2.6 g/dL (ref 1.5–4.5)
Glucose: 163 mg/dL — ABNORMAL HIGH (ref 65–99)
Potassium: 4.4 mmol/L (ref 3.5–5.2)
Sodium: 137 mmol/L (ref 134–144)
Total Protein: 7.1 g/dL (ref 6.0–8.5)

## 2020-03-17 LAB — LIPID PANEL
Chol/HDL Ratio: 4.2 ratio (ref 0.0–5.0)
Cholesterol, Total: 121 mg/dL (ref 100–199)
HDL: 29 mg/dL — ABNORMAL LOW (ref >39)
LDL Chol Calc (NIH): 55 mg/dL (ref 0–99)
Triglycerides: 227 mg/dL — ABNORMAL HIGH (ref 0–149)
VLDL Cholesterol Cal: 37 mg/dL (ref 5–40)

## 2020-03-17 LAB — TSH: TSH: 3.42 u[IU]/mL (ref 0.450–4.500)

## 2020-03-18 ENCOUNTER — Telehealth: Payer: Self-pay

## 2020-03-18 NOTE — Telephone Encounter (Signed)
-----   Message from [name], MD sent at 03/18/2020  9:13 AM EDT ----- Stable.  Stay hydrated with water.  Avoid anti-inflammatories to protect kidneys.

## 2020-03-18 NOTE — Telephone Encounter (Signed)
Patient advised of lab results

## 2020-03-21 ENCOUNTER — Other Ambulatory Visit: Payer: Self-pay | Admitting: Family Medicine

## 2020-04-18 ENCOUNTER — Ambulatory Visit: Payer: PPO | Admitting: Dermatology

## 2020-04-18 ENCOUNTER — Other Ambulatory Visit: Payer: Self-pay

## 2020-04-18 DIAGNOSIS — B353 Tinea pedis: Secondary | ICD-10-CM

## 2020-04-18 DIAGNOSIS — D239 Other benign neoplasm of skin, unspecified: Secondary | ICD-10-CM

## 2020-04-18 DIAGNOSIS — F5104 Psychophysiologic insomnia: Secondary | ICD-10-CM | POA: Diagnosis not present

## 2020-04-18 DIAGNOSIS — D235 Other benign neoplasm of skin of trunk: Secondary | ICD-10-CM | POA: Diagnosis not present

## 2020-04-18 DIAGNOSIS — L820 Inflamed seborrheic keratosis: Secondary | ICD-10-CM | POA: Diagnosis not present

## 2020-04-18 DIAGNOSIS — R251 Tremor, unspecified: Secondary | ICD-10-CM | POA: Diagnosis not present

## 2020-04-18 DIAGNOSIS — R259 Unspecified abnormal involuntary movements: Secondary | ICD-10-CM | POA: Diagnosis not present

## 2020-04-18 NOTE — Progress Notes (Signed)
   Follow-Up Visit   Subjective  Manuel Stafford. is a 74 y.o. male who presents for the following: Follow-up. Patient here today for 6 week follow up. ISK treated at left sideburn has resolved per patient.  Patient also had biopsy done at right mid side which showed benign poroma. Here to discuss treatment options.   The following portions of the chart were reviewed this encounter and updated as appropriate:  Tobacco  Allergies  Meds  Problems  Med Hx  Surg Hx  Fam Hx     Review of Systems:  No other skin or systemic complaints except as noted in HPI or Assessment and Plan.  Objective  Well appearing patient in no apparent distress; mood and affect are within normal limits.  A focused examination was performed including face. Relevant physical exam findings are noted in the Assessment and Plan.  Objective  Right mid side: Bx proven poroma  Objective  Bilateral feet: Scaling and maceration web spaces and over distal and lateral soles.   Objective  Left sideburn: Clear s/p LN2   Assessment & Plan  Eccrine poroma -biopsy-proven Right mid side Discussed surgery option to treat if becomes bothersome for patient.  Patient declines surgery at this time. Benign-appearing.  Observation.  Call clinic for new or changing lesions.  Recommend daily use of broad spectrum spf 30+ sunscreen to sun-exposed areas.   Tinea pedis of both feet Bilateral feet Cont ketoconazole 2% cream at bedtime to feet and in between toes.  ketoconazole (NIZORAL) 2 % cream - Bilateral feet  Inflamed seborrheic keratosis Left sideburn Appears clear today Benign-appearing.  Observation.  Call clinic for new or changing lesions.  Recommend daily use of broad spectrum spf 30+ sunscreen to sun-exposed areas.   Return in about 6 months (around 10/17/2020).  Graciella Belton, RMA, am acting as scribe for Sarina Ser, MD . Documentation: I have reviewed the above documentation for accuracy and  completeness, and I agree with the above.  Sarina Ser, MD

## 2020-04-18 NOTE — Patient Instructions (Signed)

## 2020-04-19 ENCOUNTER — Encounter: Payer: Self-pay | Admitting: Dermatology

## 2020-04-28 ENCOUNTER — Other Ambulatory Visit: Payer: Self-pay

## 2020-04-28 ENCOUNTER — Other Ambulatory Visit: Payer: Self-pay | Admitting: Family Medicine

## 2020-04-28 ENCOUNTER — Ambulatory Visit (INDEPENDENT_AMBULATORY_CARE_PROVIDER_SITE_OTHER): Payer: PPO

## 2020-04-28 DIAGNOSIS — Z23 Encounter for immunization: Secondary | ICD-10-CM | POA: Diagnosis not present

## 2020-05-10 NOTE — Progress Notes (Addendum)
I,Manuel Stafford,acting as a scribe for Manuel Durie, MD.,have documented all relevant documentation on the behalf of Manuel Durie, MD,as directed by  Manuel Durie, MD while in the presence of Manuel Durie, MD.  Established patient visit   Patient: Manuel Stafford.   DOB: [birth year]   74 y.o. Male  MRN: [id number] Visit Date: 05/11/2020  Today's healthcare provider: [name], MD   Chief Complaint  Patient presents with  . Diabetes  . Follow-up  . Hypertension   Subjective    HPI  Patient feels well taking Ozempic.  It helps him with DM. Diabetes Mellitus Type II, follow-up  Lab Results  Component Value Date   HGBA1C 7.1 (A) 02/09/2020   HGBA1C 6.2 (A) 10/12/2019   HGBA1C 6.7 (A) 07/13/2019   Last seen for diabetes 3 months ago.  Management since then includes; Changed to Trulicity to see if insurance will cover better than Ozempic.  He reports good compliance with treatment. He is not having side effects. none  Home blood sugar records: fasting range: 100-131  Episodes of hypoglycemia? No none   Current insulin regiment: yes Most Recent Eye Exam: due  --------------------------------------------------------------------  Hypertension, follow-up  BP Readings from Last 3 Encounters:  05/11/20 111/81  03/14/20 129/72  03/03/20 132/75   Wt Readings from Last 3 Encounters:  05/11/20 246 lb (111.6 kg)  03/14/20 247 lb 3.2 oz (112.1 kg)  03/03/20 247 lb 6.4 oz (112.2 kg)     He was last seen for hypertension 2 months ago.  BP at that visit was 126/75. Management since that visit includes; on HCTZ He reports good compliance with treatment. He is not having side effects. none He is exercising. He is adherent to low salt diet.   Outside blood pressures are normal.  He does not smoke.  Use of agents associated with hypertension: none.   --------------------------------------------------------------------        Medications: Outpatient Medications Prior to Visit  Medication Sig  . aspirin 81 MG tablet Take 81 mg by mouth daily.   . cetirizine (ZYRTEC) 10 MG tablet TAKE 1 TABLET BY MOUTH EVERY DAY  . Cholecalciferol (VITAMIN D3) 2000 UNITS capsule Take 2,000 Units by mouth daily.   . diphenhydrAMINE (BENADRYL ALLERGY) 25 mg capsule Take by mouth.   . hydrochlorothiazide (HYDRODIURIL) 25 MG tablet TAKE 1 TABLET BY MOUTH EVERY DAY  . ketoconazole (NIZORAL) 2 % cream Apply to feet and between toes QHS.  [name] meclizine (ANTIVERT) 25 MG tablet Take 2 tablets (50 mg total) by mouth 3 (three) times daily as needed for dizziness.  . metFORMIN (GLUCOPHAGE) 1000 MG tablet TAKE 1 TABLET (1,000 MG TOTAL) BY MOUTH 2 (TWO) TIMES DAILY WITH A MEAL.  . MULTIPLE VITAMIN PO Take by mouth daily.   [name] omeprazole (PRILOSEC) 20 MG capsule TAKE 1 CAPSULE BY MOUTH EVERY DAY  . rOPINIRole (REQUIP) 0.25 MG tablet Take by mouth daily.   . rosuvastatin (CRESTOR) 20 MG tablet TAKE 1 TABLET BY MOUTH EVERY DAY  . Saw Palmetto, Serenoa repens, (SAW PALMETTO BERRY) 160 MG CAPS Take 2 tablets by mouth daily.   . Semaglutide,0.25 or 0.5MG /DOS, (OZEMPIC, 0.25 OR 0.5 MG/DOSE,) 2 MG/1.5ML SOPN Inject 0.5 mg into the skin once a week.  . sildenafil (REVATIO) 20 MG tablet Take 1-5 tablets by mouth daily as needed.  . tadalafil (CIALIS) 20 MG tablet Take 0.5-1 tablets (10-20 mg total) by mouth every other day as needed for  erectile dysfunction.  [name] telmisartan (MICARDIS) 40 MG tablet TAKE 1 TABLET BY MOUTH EVERY DAY  . tiZANidine (ZANAFLEX) 4 MG tablet TAKE 1 TABLET (4 MG TOTAL) BY MOUTH AT BEDTIME.  . traMADol (ULTRAM) 50 MG tablet TAKE 1 TABLET BY MOUTH 3 TIMES A DAY AS NEEDED FOR BACK OR LEG PAIN  . Dulaglutide (TRULICITY) 1.5 TD/3.2KG SOPN Inject 0.5 mLs (1.5 mg total) into the skin once a week. (Patient not taking: Reported on 05/11/2020)   No facility-administered medications prior to visit.    Review of Systems  Constitutional:  Negative for appetite change, chills and fever.  Respiratory: Negative for chest tightness, shortness of breath and wheezing.   Cardiovascular: Negative for chest pain and palpitations.  Gastrointestinal: Negative for abdominal pain, nausea and vomiting.    Last hemoglobin A1c Lab Results  Component Value Date   HGBA1C 7.0 (A) 05/11/2020      Objective    BP 111/81 (BP Location: Left Arm, Patient Position: Sitting, Cuff Size: Large)   Pulse 90   Temp 97.7 F (36.5 C) (Oral)   Resp 16   Ht 6' (1.829 m)   Wt 246 lb (111.6 kg)   SpO2 96%   BMI 33.36 kg/m  BP Readings from Last 3 Encounters:  05/11/20 111/81  03/14/20 129/72  03/03/20 132/75   Wt Readings from Last 3 Encounters:  05/11/20 246 lb (111.6 kg)  03/14/20 247 lb 3.2 oz (112.1 kg)  03/03/20 247 lb 6.4 oz (112.2 kg)      Physical Exam Vitals reviewed.  Constitutional:      Appearance: Normal appearance. He is not diaphoretic.  HENT:     Head: Normocephalic and atraumatic.     Right Ear: External ear normal.     Left Ear: External ear normal.  Eyes:     General: No scleral icterus.    Conjunctiva/sclera: Conjunctivae normal.  Neck:     Vascular: No carotid bruit.  Cardiovascular:     Rate and Rhythm: Normal rate and regular rhythm.     Pulses: Normal pulses.     Heart sounds: Normal heart sounds.  Pulmonary:     Effort: Pulmonary effort is normal.     Breath sounds: Normal breath sounds.  Abdominal:     Palpations: Abdomen is soft.  Musculoskeletal:     Comments: He has trace lower extremity edema  Lymphadenopathy:     Cervical: No cervical adenopathy.  Neurological:     General: No focal deficit present.     Mental Status: He is alert and oriented to person, place, and time.     Comments: Normal diabetic foot exam  Psychiatric:        Mood and Affect: Mood normal.        Behavior: Behavior normal.        Thought Content: Thought content normal.        Judgment: Judgment normal.       No  results found for any visits on 05/11/20.  Assessment & Plan     1. Type 2 diabetes mellitus with other specified complication, without long-term current use of insulin (HCC Hemoglobin A1c is 7.0.  With the help of Ozempic. - POCT glycosylated hemoglobin (Hb A1C) - Semaglutide,0.25 or 0.5MG /DOS, (OZEMPIC, 0.25 OR 0.5 MG/DOSE,) 2 MG/1.5ML SOPN; Inject 0.5 mg into the skin once a week.  Dispense: 1.5 mL; Refill: 5  2. Benign essential HTN Good control.  3. Obstructive sleep apnea syndrome On CPAP  4. Lumbosacral spondylosis  without myelopathy   5. Other hyperlipidemia   6. Class 1 obesity due to excess calories with serious comorbidity and body mass index (BMI) of 31.0 to 31.9 in adult Diet and exercise has been stressed 7.  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  Okay off of statin.  Consider trying Zetia again for hyperlipidemia present.  No follow-ups on file.         Shaunice Levitan Cranford Mon, MD  [facility] [phone] (phone) [phone] (fax)  [place]

## 2020-05-11 ENCOUNTER — Encounter: Payer: Self-pay | Admitting: Family Medicine

## 2020-05-11 ENCOUNTER — Other Ambulatory Visit: Payer: Self-pay

## 2020-05-11 ENCOUNTER — Ambulatory Visit (INDEPENDENT_AMBULATORY_CARE_PROVIDER_SITE_OTHER): Payer: PPO | Admitting: Family Medicine

## 2020-05-11 VITALS — BP 111/81 | HR 90 | Temp 97.7°F | Resp 16 | Ht 72.0 in | Wt 246.0 lb

## 2020-05-11 DIAGNOSIS — M47817 Spondylosis without myelopathy or radiculopathy, lumbosacral region: Secondary | ICD-10-CM | POA: Diagnosis not present

## 2020-05-11 DIAGNOSIS — E6609 Other obesity due to excess calories: Secondary | ICD-10-CM | POA: Diagnosis not present

## 2020-05-11 DIAGNOSIS — E1169 Type 2 diabetes mellitus with other specified complication: Secondary | ICD-10-CM

## 2020-05-11 DIAGNOSIS — G729 Myopathy, unspecified: Secondary | ICD-10-CM

## 2020-05-11 DIAGNOSIS — I10 Essential (primary) hypertension: Secondary | ICD-10-CM | POA: Diagnosis not present

## 2020-05-11 DIAGNOSIS — E7849 Other hyperlipidemia: Secondary | ICD-10-CM

## 2020-05-11 DIAGNOSIS — G4733 Obstructive sleep apnea (adult) (pediatric): Secondary | ICD-10-CM

## 2020-05-11 DIAGNOSIS — Z6831 Body mass index (BMI) 31.0-31.9, adult: Secondary | ICD-10-CM | POA: Diagnosis not present

## 2020-05-11 LAB — POCT GLYCOSYLATED HEMOGLOBIN (HGB A1C)
Est. average glucose Bld gHb Est-mCnc: 154
Hemoglobin A1C: 7 % — AB (ref 4.0–5.6)

## 2020-05-11 MED ORDER — OZEMPIC (0.25 OR 0.5 MG/DOSE) 2 MG/1.5ML ~~LOC~~ SOPN: 0.5000 mg | PEN_INJECTOR | SUBCUTANEOUS | 5 refills | Status: DC

## 2020-05-23 DIAGNOSIS — H524 Presbyopia: Secondary | ICD-10-CM | POA: Diagnosis not present

## 2020-05-23 DIAGNOSIS — E119 Type 2 diabetes mellitus without complications: Secondary | ICD-10-CM | POA: Diagnosis not present

## 2020-05-23 DIAGNOSIS — H52213 Irregular astigmatism, bilateral: Secondary | ICD-10-CM | POA: Diagnosis not present

## 2020-05-23 DIAGNOSIS — H5203 Hypermetropia, bilateral: Secondary | ICD-10-CM | POA: Diagnosis not present

## 2020-05-23 DIAGNOSIS — H2513 Age-related nuclear cataract, bilateral: Secondary | ICD-10-CM | POA: Diagnosis not present

## 2020-05-23 LAB — HM DIABETES EYE EXAM

## 2020-06-02 ENCOUNTER — Ambulatory Visit: Payer: Self-pay | Admitting: *Deleted

## 2020-06-02 NOTE — Telephone Encounter (Signed)
Calls with burning sensation on the left side of abdomen towards the back. Began 2 days ago.Possible beginning of shingles, no blisters at this time. Mild discomfort. Delayed shingles vaccines due to Covid vaccines.  Patient was seen in the office on 05/11/20 Answered patient's questions, advised tylenol or ibuprofen for discomfort, antihistamine for itching prn. Pharmacy on file [facility]-routing to pcp for review. Reason for Disposition . [1] Shingles rash already diagnosed and [2] taking antiviral medication  Answer Assessment - Initial Assessment Questions 1. APPEARANCE of RASH: "Describe the rash."     No rash 2. LOCATION: "Where is the rash located?"      Burning sensation across the left side of abdomen towards the back 3. ONSET: "When did the rash start?"      na 4. ITCHING: "Does the rash itch?" If Yes, ask: "How bad is the itch?"  (Scale 1-10; or mild, moderate, severe)     No itch 5. PAIN: "Does the rash hurt?" If Yes, ask: "How bad is the pain?"  (Scale 1-10; or mild, moderate, severe)     Sensation is mild at this time 6. OTHER SYMPTOMS: "Do you have any other symptoms?" (e.g., fever)     no 7. PREGNANCY: "Is there any chance you are pregnant?" "When was your last menstrual period?"     na  Protocols used: [facility]

## 2020-06-03 ENCOUNTER — Other Ambulatory Visit: Payer: Self-pay | Admitting: Family Medicine

## 2020-06-03 DIAGNOSIS — M5136 Other intervertebral disc degeneration, lumbar region: Secondary | ICD-10-CM

## 2020-06-03 DIAGNOSIS — E119 Type 2 diabetes mellitus without complications: Secondary | ICD-10-CM

## 2020-06-03 NOTE — Telephone Encounter (Signed)
Please review

## 2020-06-03 NOTE — Telephone Encounter (Signed)
Go to urgent care if breaks out in rash, otherwise call Monday to schedule appt if not better.

## 2020-06-03 NOTE — Telephone Encounter (Signed)
Requested medication (s) are due for refill today: Yes  Requested medication (s) are on the active medication list: Yes  Last refill:  One Touch Strips - 04/23/19 expired  Zanaflex - 12/30/19  Tramadol - 11/30/19  Future visit scheduled: Yes  Notes to clinic:  See requests.    Requested Prescriptions  Pending Prescriptions Disp Refills   ONETOUCH ULTRA test strip [Pharmacy Med Name: Ruth TEST STRP] 100 strip 4    Sig: USE DAILY AS DIRECTED      Endocrinology: Diabetes - Testing Supplies Passed - 06/03/2020 10:09 AM      Passed - Valid encounter within last 12 months    Recent Outpatient Visits           3 weeks ago Type 2 diabetes mellitus with other specified complication, without long-term current use of insulin ([place])   [facility] [name], MD   2 months ago Annual physical exam   [facility] [name], MD   3 months ago Pain of left great toe   [facility] [name], MD   3 months ago Type 2 diabetes mellitus with other specified complication, without long-term current use of insulin [facility])   [facility] [name], MD   7 months ago Type 2 diabetes mellitus with other specified complication, without long-term current use of insulin [facility])   [facility] [name], MD       Future Appointments             In 3 months [name], MD [facility], [place]   In 4 months Ralene Bathe, MD [place]               traMADol (ULTRAM) 50 MG tablet [Pharmacy Med Name: TRAMADOL HCL 50 MG TABLET] 180 tablet     Sig: TAKE 1 TABLET BY MOUTH 3 TIMES A DAY AS NEEDED FOR BACK OR LEG PAIN      Not Delegated - Analgesics:  Opioid Agonists Failed - 06/03/2020 10:09 AM      Failed - This refill cannot be delegated      Failed - Urine Drug Screen completed in last 360 days      Passed - Valid  encounter within last 6 months    Recent Outpatient Visits           3 weeks ago Type 2 diabetes mellitus with other specified complication, without long-term current use of insulin [facility])   [facility] [name], MD   2 months ago Annual physical exam   [facility] [name], MD   3 months ago Pain of left great toe   [facility] [name], MD   3 months ago Type 2 diabetes mellitus with other specified complication, without long-term current use of insulin [facility])   [facility] [name], MD   7 months ago Type 2 diabetes mellitus with other specified complication, without long-term current use of insulin [facility])   [facility] [name], MD       Future Appointments             In 3 months [name], MD [facility], [place]   In 4 months Ralene Bathe, MD [place]  tiZANidine (ZANAFLEX) 4 MG tablet [Pharmacy Med Name: TIZANIDINE HCL 4 MG TABLET] 90 tablet 1    Sig: TAKE 1 TABLET (4 MG TOTAL) BY MOUTH AT BEDTIME.      Not Delegated - Cardiovascular:  Alpha-2 Agonists - tizanidine Failed - 06/03/2020 10:09 AM      Failed - This refill cannot be delegated      Passed - Valid encounter within last 6 months    Recent Outpatient Visits           3 weeks ago Type 2 diabetes mellitus with other specified complication, without long-term current use of insulin [facility])   [facility] [name], MD   2 months ago Annual physical exam   [facility] [name], MD   3 months ago Pain of left great toe   [facility] [name], MD   3 months ago Type 2 diabetes mellitus with other specified complication, without long-term current use of insulin ([place])   [facility] [name], MD   7 months ago  Type 2 diabetes mellitus with other specified complication, without long-term current use of insulin [facility])   [facility] [name], MD       Future Appointments             In 3 months [name], MD [facility], [facility]   In 4 months Ralene Bathe, MD [place]              Signed Prescriptions Disp Refills   pioglitazone (ACTOS) 15 MG tablet 90 tablet 1    Sig: TAKE 1 TABLET BY MOUTH DAILY      Endocrinology:  Diabetes - Glitazones - pioglitazone Passed - 06/03/2020 10:09 AM      Passed - HBA1C is between 0 and 7.9 and within 180 days    Hemoglobin A1C  Date Value Ref Range Status  05/11/2020 7.0 (A) 4.0 - 5.6 % Final   Hgb A1c MFr Bld  Date Value Ref Range Status  10/14/2017 8.6 (H) 4.8 - 5.6 % Final    Comment:             Prediabetes: 5.7 - 6.4          Diabetes: >6.4          Glycemic control for adults with diabetes: <7.0           Passed - Valid encounter within last 6 months    Recent Outpatient Visits           3 weeks ago Type 2 diabetes mellitus with other specified complication, without long-term current use of insulin [facility])   [facility] [name] MD   2 months ago Annual physical exam   [facility] [name] MD   3 months ago Pain of left great toe   [facility] [name], MD   3 months ago Type 2 diabetes mellitus with other specified complication, without long-term current use of insulin [facility])   [facility] [name], MD   7 months ago Type 2 diabetes mellitus with other specified complication, without long-term current use of insulin [facility])   [facility] [name], MD       Future Appointments  In 3 months [name], MD [facility], [facility]   In 4 months Ralene Bathe, MD [place]                hydrochlorothiazide (HYDRODIURIL) 25 MG tablet 90 tablet 1    Sig: TAKE 1 TABLET BY MOUTH EVERY DAY      Cardiovascular: Diuretics - Thiazide Failed - 06/03/2020 10:09 AM      Failed - Cr in normal range and within 360 days    Creatinine, Ser  Date Value Ref Range Status  03/16/2020 1.44 (H) 0.76 - 1.27 mg/dL Final          Passed - Ca in normal range and within 360 days    Calcium  Date Value Ref Range Status  03/16/2020 9.7 8.6 - 10.2 mg/dL Final          Passed - K in normal range and within 360 days    Potassium  Date Value Ref Range Status  03/16/2020 4.4 3.5 - 5.2 mmol/L Final  07/10/2012 4.3 3.5 - 5.1 mmol/L Final          Passed - Na in normal range and within 360 days    Sodium  Date Value Ref Range Status  03/16/2020 137 134 - 144 mmol/L Final          Passed - Last BP in normal range    BP Readings from Last 1 Encounters:  05/11/20 111/81          Passed - Valid encounter within last 6 months    Recent Outpatient Visits           3 weeks ago Type 2 diabetes mellitus with other specified complication, without long-term current use of insulin [facility])   [facility] [name], MD   2 months ago Annual physical exam   [facility] [name], MD   3 months ago Pain of left great toe   [facility] [name] MD   3 months ago Type 2 diabetes mellitus with other specified complication, without long-term current use of insulin [facility])   [facility] [name], MD   7 months ago Type 2 diabetes mellitus with other specified complication, without long-term current use of insulin [facility])   [facility] [name], MD       Future Appointments             In 3 months [name], MD [facility] [place]   In 4 months Ralene Bathe, MD [place]

## 2020-06-06 ENCOUNTER — Ambulatory Visit (INDEPENDENT_AMBULATORY_CARE_PROVIDER_SITE_OTHER): Payer: PPO | Admitting: Family Medicine

## 2020-06-06 ENCOUNTER — Other Ambulatory Visit: Payer: Self-pay

## 2020-06-06 ENCOUNTER — Ambulatory Visit: Payer: Self-pay | Admitting: *Deleted

## 2020-06-06 ENCOUNTER — Encounter: Payer: Self-pay | Admitting: Family Medicine

## 2020-06-06 VITALS — BP 120/78 | HR 94 | Temp 98.4°F | Resp 18 | Wt 241.0 lb

## 2020-06-06 DIAGNOSIS — E1169 Type 2 diabetes mellitus with other specified complication: Secondary | ICD-10-CM | POA: Diagnosis not present

## 2020-06-06 DIAGNOSIS — Z6831 Body mass index (BMI) 31.0-31.9, adult: Secondary | ICD-10-CM

## 2020-06-06 DIAGNOSIS — B029 Zoster without complications: Secondary | ICD-10-CM | POA: Diagnosis not present

## 2020-06-06 DIAGNOSIS — I10 Essential (primary) hypertension: Secondary | ICD-10-CM | POA: Diagnosis not present

## 2020-06-06 DIAGNOSIS — G4733 Obstructive sleep apnea (adult) (pediatric): Secondary | ICD-10-CM | POA: Diagnosis not present

## 2020-06-06 DIAGNOSIS — E6609 Other obesity due to excess calories: Secondary | ICD-10-CM | POA: Diagnosis not present

## 2020-06-06 MED ORDER — VALACYCLOVIR HCL 1 G PO TABS: 1000.0000 mg | ORAL_TABLET | Freq: Three times a day (TID) | ORAL | 0 refills | Status: DC

## 2020-06-06 MED ORDER — GABAPENTIN 100 MG PO CAPS: 100.0000 mg | ORAL_CAPSULE | Freq: Two times a day (BID) | ORAL | 3 refills | Status: DC

## 2020-06-06 NOTE — Telephone Encounter (Signed)
Patient was seen in office today.  

## 2020-06-06 NOTE — Progress Notes (Signed)
I,Manuel Stafford,acting as a scribe for Manuel Durie, MD.,have documented all relevant documentation on the behalf of Manuel Durie, MD,as directed by  Manuel Durie, MD while in the presence of Manuel Durie, MD.   Established patient visit   Patient: Manuel Stafford.   DOB: [birth year]   74 y.o. Male  MRN: [id number] Visit Date: 06/06/2020  Today's healthcare provider: [name], MD   Chief Complaint  Patient presents with  . Rash   Subjective    HPI  Patient is having pain in his left side area for 1 week. Patient states he has one bump on his left side. He believes he has shingles.  He has no rash but the pain is all in the left back and left flank and left abdomen in a dermatome distribution.      Medications: Outpatient Medications Prior to Visit  Medication Sig  . aspirin 81 MG tablet Take 81 mg by mouth daily.   . cetirizine (ZYRTEC) 10 MG tablet TAKE 1 TABLET BY MOUTH EVERY DAY  . Cholecalciferol (VITAMIN D3) 2000 UNITS capsule Take 2,000 Units by mouth daily.   . diphenhydrAMINE (BENADRYL) 25 mg capsule Take by mouth.   . hydrochlorothiazide (HYDRODIURIL) 25 MG tablet TAKE 1 TABLET BY MOUTH EVERY DAY  . ketoconazole (NIZORAL) 2 % cream Apply to feet and between toes QHS.  [name] meclizine (ANTIVERT) 25 MG tablet Take 2 tablets (50 mg total) by mouth 3 (three) times daily as needed for dizziness.  . metFORMIN (GLUCOPHAGE) 1000 MG tablet TAKE 1 TABLET (1,000 MG TOTAL) BY MOUTH 2 (TWO) TIMES DAILY WITH A MEAL.  . MULTIPLE VITAMIN PO Take by mouth daily.   [name] omeprazole (PRILOSEC) 20 MG capsule TAKE 1 CAPSULE BY MOUTH EVERY DAY  . pioglitazone (ACTOS) 15 MG tablet TAKE 1 TABLET BY MOUTH DAILY  . rOPINIRole (REQUIP) 0.25 MG tablet Take by mouth daily.   . rosuvastatin (CRESTOR) 20 MG tablet TAKE 1 TABLET BY MOUTH EVERY DAY  . Saw Palmetto, Serenoa repens, (SAW PALMETTO BERRY) 160 MG CAPS Take 2 tablets by mouth daily.   . Semaglutide,0.25 or  0.5MG /DOS, (OZEMPIC, 0.25 OR 0.5 MG/DOSE,) 2 MG/1.5ML SOPN Inject 0.5 mg into the skin once a week.  . sildenafil (REVATIO) 20 MG tablet Take 1-5 tablets by mouth daily as needed.  . tadalafil (CIALIS) 20 MG tablet Take 0.5-1 tablets (10-20 mg total) by mouth every other day as needed for erectile dysfunction.  [name] telmisartan (MICARDIS) 40 MG tablet TAKE 1 TABLET BY MOUTH EVERY DAY  . tiZANidine (ZANAFLEX) 4 MG tablet TAKE 1 TABLET (4 MG TOTAL) BY MOUTH AT BEDTIME.  . traMADol (ULTRAM) 50 MG tablet TAKE 1 TABLET BY MOUTH 3 TIMES A DAY AS NEEDED FOR BACK OR LEG PAIN   No facility-administered medications prior to visit.    Review of Systems     Objective    BP 120/78 (BP Location: Left Arm, Patient Position: Sitting, Cuff Size: Large)   Pulse 94   Temp 98.4 F (36.9 C) (Oral)   Resp 18   Wt 241 lb (109.3 kg)   SpO2 98%   BMI 32.69 kg/m  BP Readings from Last 3 Encounters:  06/06/20 120/78  05/11/20 111/81  03/14/20 129/72   Wt Readings from Last 3 Encounters:  06/06/20 241 lb (109.3 kg)  05/11/20 246 lb (111.6 kg)  03/14/20 247 lb 3.2 oz (112.1 kg)      Physical Exam  Vitals reviewed.  Constitutional:      Appearance: Normal appearance. He is not diaphoretic.  HENT:     Head: Normocephalic and atraumatic.     Right Ear: External ear normal.     Left Ear: External ear normal.  Eyes:     General: No scleral icterus.    Conjunctiva/sclera: Conjunctivae normal.  Neck:     Vascular: No carotid bruit.  Cardiovascular:     Rate and Rhythm: Normal rate and regular rhythm.     Pulses: Normal pulses.     Heart sounds: Normal heart sounds.  Pulmonary:     Effort: Pulmonary effort is normal.     Breath sounds: Normal breath sounds.  Abdominal:     Palpations: Abdomen is soft.     Tenderness: There is no abdominal tenderness.  Musculoskeletal:     Comments: He has trace lower extremity edema  Lymphadenopathy:     Cervical: No cervical adenopathy.  Skin:    General:  Skin is warm and dry.     Comments: No rash at all.  Neurological:     General: No focal deficit present.     Mental Status: He is alert and oriented to person, place, and time.     Comments: Normal diabetic foot exam  Psychiatric:        Mood and Affect: Mood normal.        Behavior: Behavior normal.        Thought Content: Thought content normal.        Judgment: Judgment normal.       No results found for any visits on 06/06/20.  Assessment & Plan     1. Herpes zoster without complication I told patient that technically cannot meet the diagnosis of shingles without the rash but will treat presumptively for that because the symptoms match.  Will treat with Valtrex 1 g 3 times daily for 1 week and gabapentin 100 mg twice daily follow-up next month  2. Type 2 diabetes mellitus with other specified complication, without long-term current use of insulin ([place])   3. Obstructive sleep apnea syndrome   4. Benign essential HTN   5. Class 1 obesity due to excess calories with serious comorbidity and body mass index (BMI) of 31.0 to 31.9 in adult With diabetes sleep apnea and hypertension   No follow-ups on file.         Shereen Marton Cranford Mon, MD  [facility] [phone] (phone) [phone] (fax)  [place]

## 2020-06-06 NOTE — Telephone Encounter (Signed)
  Pt called in c/o having shingles on his back and going around his side.   He had a mild shingles case about 2 yrs ago but this time it's bad and causing him a lot of pain.  He called in Friday and talked with Rip Harbour one of our triage nurses with [facility].   He was triaged for shingles.    The office left a message from Dr. Rosanna Randy for him to go to the urgent care if he broke out in a rash .   This message was not delivered to him.   I read him the message from Dr. Rosanna Randy.   He c/o being in a lot of pain over the weekend because he didn't know he needed to go on to the urgent care.  I apologized for the information not getting to him.  I made him a same day appt with Dr. Rosanna Randy for this morning at 11:20.   COVID questionnaire completed. Reason for Disposition . [1] Prescription refill request for NON-ESSENTIAL medicine (i.e., no harm to patient if med not taken) AND [2] triager unable to refill per department policy  Answer Assessment - Initial Assessment Questions 1. NAME of MEDICATION: "What medicine are you calling about?"     Thurs I called to get an appt.   I spoke with Rip Harbour a nurse.  I have shingles.  Dr. Rosanna Randy is supposed to call me in something for the shingles. 2. QUESTION: "What is your question?" (e.g., medication refill, side effect)     I'm supposed to have an Rx called in. 3. PRESCRIBING HCP: "Who prescribed it?" Reason: if prescribed by specialist, call should be referred to that group.     Not prescribed. 4. SYMPTOMS: "Do you have any symptoms?"     Yes.   I've had shingles 2 yrs ago.   This time I'm having pain in the middle of myback and it hurts a lot. 5. SEVERITY: If symptoms are present, ask "Are they mild, moderate or severe?"     I'm in a lot of pain. 6. PREGNANCY:  "Is there any chance that you are pregnant?" "When was your last menstrual period?"     N/A  Protocols used: MEDICATION QUESTION CALL-A-AH

## 2020-06-06 NOTE — Telephone Encounter (Signed)
Patient was seen in office

## 2020-06-08 ENCOUNTER — Encounter: Payer: Self-pay | Admitting: *Deleted

## 2020-06-13 ENCOUNTER — Encounter: Payer: Self-pay | Admitting: Family Medicine

## 2020-06-15 ENCOUNTER — Other Ambulatory Visit: Payer: Self-pay

## 2020-06-15 ENCOUNTER — Telehealth (INDEPENDENT_AMBULATORY_CARE_PROVIDER_SITE_OTHER): Payer: PPO | Admitting: Family Medicine

## 2020-06-15 DIAGNOSIS — K5792 Diverticulitis of intestine, part unspecified, without perforation or abscess without bleeding: Secondary | ICD-10-CM

## 2020-06-15 DIAGNOSIS — B029 Zoster without complications: Secondary | ICD-10-CM

## 2020-06-15 DIAGNOSIS — R5081 Fever presenting with conditions classified elsewhere: Secondary | ICD-10-CM

## 2020-06-15 MED ORDER — VALACYCLOVIR HCL 1 G PO TABS: 1000.0000 mg | ORAL_TABLET | Freq: Three times a day (TID) | ORAL | 0 refills | Status: DC

## 2020-06-15 MED ORDER — GABAPENTIN 300 MG PO CAPS: 300.0000 mg | ORAL_CAPSULE | Freq: Two times a day (BID) | ORAL | 3 refills | Status: DC

## 2020-06-15 MED ORDER — DOXYCYCLINE HYCLATE 100 MG PO TABS: 100.0000 mg | ORAL_TABLET | Freq: Two times a day (BID) | ORAL | 0 refills | Status: DC

## 2020-06-15 NOTE — Progress Notes (Signed)
Virtual telephone visit    Virtual Visit via Telephone Note   This visit type was conducted due to national recommendations for restrictions regarding the COVID-19 Pandemic (e.g. social distancing) in an effort to limit this patient's exposure and mitigate transmission in our community. Due to his co-morbid illnesses, this patient is at least at moderate risk for complications without adequate follow up. This format is felt to be most appropriate for this patient at this time. The patient did not have access to video technology or had technical difficulties with video requiring transitioning to audio format only (telephone). Physical exam was limited to content and character of the telephone converstion.    Patient location: Home Provider location: Office  I discussed the limitations of evaluation and management by telemedicine and the availability of in person appointments. The patient expressed understanding and agreed to proceed.   Visit Date: 06/15/2020  Today's healthcare provider: [name], MD   No chief complaint on file.  Subjective    HPI  Patient continues to have discomfort on the left back and left abdomen apparently in a dermatome distribution.  The gabapentin has helped him sleep but he still has discomfort.  Noted rash.  He has developed a fever of of 99 degrees some during the past couple of days since he finished the Valtrex and has had some diarrhea. No respiratory symptoms.    Patient Active Problem List   Diagnosis Date Noted  . Long term (current) use of non-steroidal anti-inflammatories (nsaid) 07/13/2019  . Shoulder pain 03/21/2016  . Hydronephrosis with urinary obstruction due to ureteral calculus 09/26/2015  . Microscopic hematuria 09/26/2015  . Hypertensive left ventricular hypertrophy 09/13/2015  . Nephrolithiasis 08/29/2015  . Right ureteral stone 08/29/2015  . Abnormal ECG 08/22/2015  . Benign essential HTN 08/22/2015  . Combined fat  and carbohydrate induced hyperlipemia 08/22/2015  . Breathlessness on exertion 08/22/2015  . Lung nodules 04/28/2015  . Allergic rhinitis 12/31/2014  . Diabetes ([place]) 12/31/2014  . Erectile dysfunction 12/31/2014  . Fatty liver disease, nonalcoholic [id number]  . Acid reflux 12/31/2014  . BP (high blood pressure) 12/31/2014  . HLD (hyperlipidemia) 12/31/2014  . Lumbosacral spondylosis without myelopathy 12/31/2014  . Adiposity 12/31/2014  . Herpes zona 12/31/2014  . Disorder of shoulder 12/31/2014  . Apnea, sleep 12/31/2014  . Avitaminosis D 12/31/2014  . DDD (degenerative disc disease), lumbar 11/30/2013  . Back ache 11/30/2013   Social History   Tobacco Use  . Smoking status: Former Smoker    Packs/day: 2.00    Years: 15.00    Pack years: 30.00    Types: Cigarettes    Quit date: 06/25/1974    Years since quitting: 46.0  . Smokeless tobacco: Never Used  Vaping Use  . Vaping Use: Never used  Substance Use Topics  . Alcohol use: No  . Drug use: No   No Known Allergies    Medications: Outpatient Medications Prior to Visit  Medication Sig  . aspirin 81 MG tablet Take 81 mg by mouth daily.   . cetirizine (ZYRTEC) 10 MG tablet TAKE 1 TABLET BY MOUTH EVERY DAY  . Cholecalciferol (VITAMIN D3) 2000 UNITS capsule Take 2,000 Units by mouth daily.   . diphenhydrAMINE (BENADRYL) 25 mg capsule Take by mouth.   . gabapentin (NEURONTIN) 100 MG capsule Take 1 capsule (100 mg total) by mouth 2 (two) times daily.  . hydrochlorothiazide (HYDRODIURIL) 25 MG tablet TAKE 1 TABLET BY MOUTH EVERY DAY  . ketoconazole (NIZORAL) 2 %  cream Apply to feet and between toes QHS.  [name] meclizine (ANTIVERT) 25 MG tablet Take 2 tablets (50 mg total) by mouth 3 (three) times daily as needed for dizziness.  . metFORMIN (GLUCOPHAGE) 1000 MG tablet TAKE 1 TABLET (1,000 MG TOTAL) BY MOUTH 2 (TWO) TIMES DAILY WITH A MEAL.  . MULTIPLE VITAMIN PO Take by mouth daily.   [name] omeprazole (PRILOSEC) 20 MG capsule TAKE  1 CAPSULE BY MOUTH EVERY DAY  . ONETOUCH ULTRA test strip USE DAILY AS DIRECTED  . pioglitazone (ACTOS) 15 MG tablet TAKE 1 TABLET BY MOUTH DAILY  . rOPINIRole (REQUIP) 0.25 MG tablet Take by mouth daily.   . rosuvastatin (CRESTOR) 20 MG tablet TAKE 1 TABLET BY MOUTH EVERY DAY  . Saw Palmetto, Serenoa repens, (SAW PALMETTO BERRY) 160 MG CAPS Take 2 tablets by mouth daily.   . Semaglutide,0.25 or 0.5MG /DOS, (OZEMPIC, 0.25 OR 0.5 MG/DOSE,) 2 MG/1.5ML SOPN Inject 0.5 mg into the skin once a week.  . tadalafil (CIALIS) 20 MG tablet Take 0.5-1 tablets (10-20 mg total) by mouth every other day as needed for erectile dysfunction.  [name] telmisartan (MICARDIS) 40 MG tablet TAKE 1 TABLET BY MOUTH EVERY DAY  . tiZANidine (ZANAFLEX) 4 MG tablet TAKE 1 TABLET (4 MG TOTAL) BY MOUTH AT BEDTIME.  . traMADol (ULTRAM) 50 MG tablet TAKE 1 TABLET BY MOUTH 3 TIMES A DAY AS NEEDED FOR BACK OR LEG PAIN  . valACYclovir (VALTREX) 1000 MG tablet Take 1 tablet (1,000 mg total) by mouth 3 (three) times daily.  . [DISCONTINUED] sildenafil (REVATIO) 20 MG tablet Take 1-5 tablets by mouth daily as needed.   No facility-administered medications prior to visit.    Review of Systems  Last CBC Lab Results  Component Value Date   WBC 8.0 03/16/2020   HGB 15.3 03/16/2020   HCT 47.1 03/16/2020   MCV 97 03/16/2020   MCH 31.4 03/16/2020   RDW 12.0 03/16/2020   PLT 177 [id number]   Last metabolic panel Lab Results  Component Value Date   GLUCOSE 163 (H) 03/16/2020   NA 137 03/16/2020   K 4.4 03/16/2020   CL 98 03/16/2020   CO2 19 (L) 03/16/2020   BUN 21 03/16/2020   CREATININE 1.44 (H) 03/16/2020   GFRNONAA 47 (L) 03/16/2020   GFRAA 55 (L) 03/16/2020   CALCIUM 9.7 03/16/2020   PROT 7.1 03/16/2020   ALBUMIN 4.5 03/16/2020   LABGLOB 2.6 03/16/2020   AGRATIO 1.7 03/16/2020   BILITOT 0.5 03/16/2020   ALKPHOS 129 (H) 03/16/2020   AST 38 03/16/2020   ALT 35 03/16/2020   ANIONGAP 8 09/19/2015   Last lipids Lab  Results  Component Value Date   CHOL 121 03/16/2020   HDL 29 (L) 03/16/2020   LDLCALC 55 03/16/2020   TRIG 227 (H) 03/16/2020   CHOLHDL 4.2 03/16/2020   Last hemoglobin A1c Lab Results  Component Value Date   HGBA1C 7.0 (A) 05/11/2020      Objective    There were no vitals taken for this visit. BP Readings from Last 3 Encounters:  06/06/20 120/78  05/11/20 111/81  03/14/20 129/72   Wt Readings from Last 3 Encounters:  06/06/20 241 lb (109.3 kg)  05/11/20 246 lb (111.6 kg)  03/14/20 247 lb 3.2 oz (112.1 kg)        Assessment & Plan     1. Herpes zoster without complication Increase gabapentin and redo week of Valtrex as patient still does not have rash.  Plan  to see him back in early January - gabapentin (NEURONTIN) 300 MG capsule; Take 1 capsule (300 mg total) by mouth 2 (two) times daily.  Dispense: 60 capsule; Refill: 3  2. Diverticulitis Presumptively diverticulitis with recent onset of diarrhea.  He was not tender at all with his abdominal exam is still not having  pain like he did before. - doxycycline (VIBRA-TABS) 100 MG tablet; Take 1 tablet (100 mg total) by mouth 2 (two) times daily.  Dispense: 20 tablet; Refill: 0  3. Fever in other diseases Low-grade fever.  Check Covid test - Novel Coronavirus, NAA ([facility])   No follow-ups on file.    I discussed the assessment and treatment plan with the patient. The patient was provided an opportunity to ask questions and all were answered. The patient agreed with the plan and demonstrated an understanding of the instructions.   The patient was advised to call back or seek an in-person evaluation if the symptoms worsen or if the condition fails to improve as anticipated.  I provided 12 minutes of non-face-to-face time during this encounter.    Richard Cranford Mon, MD [facility] [phone] (phone) [phone] (fax)  [place]

## 2020-06-17 LAB — SARS-COV-2, NAA 2 DAY TAT

## 2020-06-17 LAB — NOVEL CORONAVIRUS, NAA: SARS-CoV-2, NAA: NOT DETECTED

## 2020-09-01 DIAGNOSIS — K5731 Diverticulosis of large intestine without perforation or abscess with bleeding: Secondary | ICD-10-CM | POA: Diagnosis not present

## 2020-09-01 DIAGNOSIS — R1032 Left lower quadrant pain: Secondary | ICD-10-CM | POA: Diagnosis not present

## 2020-09-04 ENCOUNTER — Other Ambulatory Visit: Payer: Self-pay | Admitting: Family Medicine

## 2020-09-04 DIAGNOSIS — M5136 Other intervertebral disc degeneration, lumbar region: Secondary | ICD-10-CM

## 2020-09-04 NOTE — Telephone Encounter (Signed)
Requested medication (s) are due for refill today: yes  Requested medication (s) are on the active medication list: yes  Last refill:  06/06/20  Future visit scheduled: yes  Notes to clinic:  medication not delegated to NT to RF   Requested Prescriptions  Pending Prescriptions Disp Refills   traMADol (ULTRAM) 50 MG tablet [Pharmacy Med Name: TRAMADOL HCL 50 MG TABLET] 180 tablet 0    Sig: TAKE 1 TABLET BY MOUTH 3 TIMES A DAY AS NEEDED FOR BACK OR LEG PAIN      Not Delegated - Analgesics:  Opioid Agonists Failed - 09/04/2020  1:46 PM      Failed - This refill cannot be delegated      Failed - Urine Drug Screen completed in last 360 days      Passed - Valid encounter within last 6 months    Recent Outpatient Visits           2 months ago Herpes zoster without complication   [facility] [name], MD   3 months ago Herpes zoster without complication   [facility] [name], MD   3 months ago Type 2 diabetes mellitus with other specified complication, without long-term current use of insulin [facility])   [facility] [name], MD   5 months ago Annual physical exam   [facility] [name], MD   6 months ago Pain of left great toe   [facility] [name], MD       Future Appointments             In 1 week [name], MD [facility], [place]   In 1 month Ralene Bathe, MD [place]

## 2020-09-12 ENCOUNTER — Encounter: Payer: Self-pay | Admitting: Family Medicine

## 2020-09-12 ENCOUNTER — Ambulatory Visit (INDEPENDENT_AMBULATORY_CARE_PROVIDER_SITE_OTHER): Payer: HMO | Admitting: Family Medicine

## 2020-09-12 ENCOUNTER — Other Ambulatory Visit: Payer: Self-pay

## 2020-09-12 VITALS — BP 130/81 | HR 85 | Temp 97.8°F | Resp 18 | Ht 72.0 in | Wt 243.0 lb

## 2020-09-12 DIAGNOSIS — M25512 Pain in left shoulder: Secondary | ICD-10-CM | POA: Diagnosis not present

## 2020-09-12 DIAGNOSIS — M25552 Pain in left hip: Secondary | ICD-10-CM | POA: Diagnosis not present

## 2020-09-12 DIAGNOSIS — M545 Low back pain, unspecified: Secondary | ICD-10-CM | POA: Diagnosis not present

## 2020-09-12 DIAGNOSIS — E1169 Type 2 diabetes mellitus with other specified complication: Secondary | ICD-10-CM | POA: Diagnosis not present

## 2020-09-12 DIAGNOSIS — G8929 Other chronic pain: Secondary | ICD-10-CM

## 2020-09-12 DIAGNOSIS — M25511 Pain in right shoulder: Secondary | ICD-10-CM

## 2020-09-12 DIAGNOSIS — I10 Essential (primary) hypertension: Secondary | ICD-10-CM

## 2020-09-12 DIAGNOSIS — M25551 Pain in right hip: Secondary | ICD-10-CM

## 2020-09-12 MED ORDER — FREESTYLE LIBRE 14 DAY READER DEVI: 1.0000 | Freq: Once | 0 refills | Status: AC

## 2020-09-12 MED ORDER — FREESTYLE LIBRE 2 SENSOR MISC: 1.0000 | Freq: Once | 0 refills | Status: DC

## 2020-09-12 NOTE — Progress Notes (Signed)
I,[name],acting as a scribe for [name], MD.,have documented all relevant documentation on the behalf of [name], MD,as directed by  [name], MD while in the presence of [name], MD.   Established patient visit   Patient: Manuel Stafford.   DOB: 1945-11-27   75 y.o. Male  MRN: [id number] Visit Date: 09/12/2020  Today's healthcare provider: [name], MD   Chief Complaint  Patient presents with  . Follow-up  . Hypertension  . Diabetes   Subjective    HPI  Patient comes in today for follow-up overall he has been feeling fairly well but he has some ongoing chronic bilateral hip area pain when he has been walking about 10 minutes.  This is really slowing down and limiting his activity level.  He has some chronic shoulder pain but this is more from previous surgeries.  Dr. Gardiner Barefoot has no further follow-up for his back.  MRI shows degenerative disc disease and possible spinal stenosis a year ago.  Of his hip show some arthritic changes.  No AVN.  He has been told by health team [facility] that he should get a continuous glucose monitor although he is not on insulin. Diabetes Mellitus Type II, follow-up  Lab Results  Component Value Date   HGBA1C 7.0 (A) 05/11/2020   HGBA1C 7.1 (A) 02/09/2020   HGBA1C 6.2 (A) 10/12/2019   Last seen for diabetes 4 months ago.  Management since then includes continuing the same treatment. He reports good compliance with treatment. He is not having side effects. none  Home blood sugar records: fasting range: 150  Episodes of hypoglycemia? No none   Current insulin regiment: n/a Most Recent Eye Exam: 05/23/2020  --------------------------------------------------------------------  Hypertension, follow-up  BP Readings from Last 3 Encounters:  09/12/20 130/81  06/06/20 120/78  05/11/20 111/81   Wt Readings from Last 3 Encounters:  09/12/20 243 lb (110.2 kg)  06/06/20 241 lb  (109.3 kg)  05/11/20 246 lb (111.6 kg)     He was last seen for hypertension 4 months ago.  BP at that visit was 120/78. Management since that visit includes; Good control. He reports good compliance with treatment. He is not having side effects. none He is exercising. He is adherent to low salt diet.   Outside blood pressures are checks occasionally.  He does not smoke.  Use of agents associated with hypertension: none.   --------------------------------------------------------------------       Medications: Outpatient Medications Prior to Visit  Medication Sig  . aspirin 81 MG tablet Take 81 mg by mouth daily.   . cetirizine (ZYRTEC) 10 MG tablet TAKE 1 TABLET BY MOUTH EVERY DAY  . Cholecalciferol (VITAMIN D3) 2000 UNITS capsule Take 2,000 Units by mouth daily.   . diphenhydrAMINE (BENADRYL) 25 mg capsule Take by mouth.   . doxycycline (VIBRA-TABS) 100 MG tablet Take 1 tablet (100 mg total) by mouth 2 (two) times daily.  [name] gabapentin (NEURONTIN) 300 MG capsule Take 1 capsule (300 mg total) by mouth 2 (two) times daily.  . hydrochlorothiazide (HYDRODIURIL) 25 MG tablet TAKE 1 TABLET BY MOUTH EVERY DAY  . ketoconazole (NIZORAL) 2 % cream Apply to feet and between toes QHS.  [name] meclizine (ANTIVERT) 25 MG tablet Take 2 tablets (50 mg total) by mouth 3 (three) times daily as needed for dizziness.  . metFORMIN (GLUCOPHAGE) 1000 MG tablet TAKE 1 TABLET (1,000 MG TOTAL) BY MOUTH 2 (TWO) TIMES DAILY WITH A  MEAL.  [name] MULTIPLE VITAMIN PO Take by mouth daily.   [name] omeprazole (PRILOSEC) 20 MG capsule TAKE 1 CAPSULE BY MOUTH EVERY DAY  . ONETOUCH ULTRA test strip USE DAILY AS DIRECTED  . pioglitazone (ACTOS) 15 MG tablet TAKE 1 TABLET BY MOUTH DAILY  . rOPINIRole (REQUIP) 0.25 MG tablet Take by mouth daily.   . rosuvastatin (CRESTOR) 20 MG tablet TAKE 1 TABLET BY MOUTH EVERY DAY  . Saw Palmetto, Serenoa repens, (SAW PALMETTO BERRY) 160 MG CAPS Take 2 tablets by mouth daily.   .  Semaglutide,0.25 or 0.5MG/DOS, (OZEMPIC, 0.25 OR 0.5 MG/DOSE,) 2 MG/1.5ML SOPN Inject 0.5 mg into the skin once a week.  . tadalafil (CIALIS) 20 MG tablet Take 0.5-1 tablets (10-20 mg total) by mouth every other day as needed for erectile dysfunction.  [name] telmisartan (MICARDIS) 40 MG tablet TAKE 1 TABLET BY MOUTH EVERY DAY  . tiZANidine (ZANAFLEX) 4 MG tablet TAKE 1 TABLET (4 MG TOTAL) BY MOUTH AT BEDTIME.  . traMADol (ULTRAM) 50 MG tablet TAKE 1 TABLET BY MOUTH 3 TIMES A DAY AS NEEDED FOR BACK OR LEG PAIN  . valACYclovir (VALTREX) 1000 MG tablet Take 1 tablet (1,000 mg total) by mouth 3 (three) times daily.   No facility-administered medications prior to visit.    Review of Systems      Objective    BP 130/81 (BP Location: Right Arm, Patient Position: Sitting, Cuff Size: Large)   Pulse 85   Temp 97.8 F (36.6 C) (Oral)   Resp 18   Ht 6' (1.829 m)   Wt 243 lb (110.2 kg)   SpO2 95%   BMI 32.96 kg/m  BP Readings from Last 3 Encounters:  09/12/20 130/81  06/06/20 120/78  05/11/20 111/81   Wt Readings from Last 3 Encounters:  09/12/20 243 lb (110.2 kg)  06/06/20 241 lb (109.3 kg)  05/11/20 246 lb (111.6 kg)       Physical Exam Vitals reviewed.  Constitutional:      Appearance: Normal appearance. He is not diaphoretic.  HENT:     Head: Normocephalic and atraumatic.     Right Ear: External ear normal.     Left Ear: External ear normal.  Eyes:     General: No scleral icterus.    Conjunctiva/sclera: Conjunctivae normal.  Neck:     Vascular: No carotid bruit.  Cardiovascular:     Rate and Rhythm: Normal rate and regular rhythm.     Pulses: Normal pulses.     Heart sounds: Normal heart sounds.  Pulmonary:     Effort: Pulmonary effort is normal.     Breath sounds: Normal breath sounds.  Abdominal:     Palpations: Abdomen is soft.  Musculoskeletal:     Comments: He has trace lower extremity edema He is not tender over the greater trochanters of either hip.   Figure-of-four maneuver is negative.  Lymphadenopathy:     Cervical: No cervical adenopathy.  Skin:    General: Skin is warm and dry.  Neurological:     General: No focal deficit present.     Mental Status: He is alert and oriented to person, place, and time.     Comments: Normal diabetic foot exam Strength is normal in both lower extremities straight leg raise is negative.  Psychiatric:        Mood and Affect: Mood normal.        Behavior: Behavior normal.        Thought Content: Thought content normal.  Judgment: Judgment normal.       No results found for any visits on 09/12/20.  Assessment & Plan     1. Type 2 diabetes mellitus with other specified complication, without long-term current use of insulin [facility]) Health Team. [employer] is advised patient he can get a continuous glucose monitor. - Hemoglobin A1c - Continuous Blood Gluc Receiver (FREESTYLE LIBRE 14 DAY READER) DEVI; 1 each by Does not apply route once for 1 dose.  Dispense: 1 each; Refill: 0 - Continuous Blood Gluc Sensor (FREESTYLE LIBRE 2 SENSOR) MISC; 1 each by Does not apply route once for 1 dose.  Dispense: 1 each; Refill: 0  2. Benign essential HTN   3. Acute pain of both shoulders  - Sed Rate (ESR)  4. Hip pain, bilateral Doubt polymyalgia rheumatica but obtain sed rate just to be complete.  I actually think he is most likely describing neurogenic claudication.  I have encouraged him to try to walk regularly and slowly push out the time that he can walk.  Need physical therapy referral.  May need back to orthopedics for hips or to neurosurgery for back. - Sed Rate (ESR)  5. Chronic bilateral low back pain, unspecified whether sciatica present  - Sed Rate (ESR)   Return in about 4 months (around 01/12/2021).      I, [name], MD, have reviewed all documentation for this visit. The documentation on 09/12/20 for the exam, diagnosis, procedures, and orders are all accurate and  complete.    Jorma Tassinari Cranford Mon, MD  [facility] [phone] (phone) [phone] (fax)  [place]

## 2020-09-13 LAB — HEMOGLOBIN A1C
Est. average glucose Bld gHb Est-mCnc: 140 mg/dL
Hgb A1c MFr Bld: 6.5 % — ABNORMAL HIGH (ref 4.8–5.6)

## 2020-09-13 LAB — SPECIMEN STATUS REPORT

## 2020-09-13 LAB — SEDIMENTATION RATE: Sed Rate: 11 mm/hr (ref 0–30)

## 2020-10-17 ENCOUNTER — Other Ambulatory Visit: Payer: Self-pay | Admitting: Family Medicine

## 2020-10-17 ENCOUNTER — Ambulatory Visit: Payer: PPO | Admitting: Dermatology

## 2020-10-17 DIAGNOSIS — B029 Zoster without complications: Secondary | ICD-10-CM

## 2020-10-17 DIAGNOSIS — E1169 Type 2 diabetes mellitus with other specified complication: Secondary | ICD-10-CM

## 2020-10-17 NOTE — Telephone Encounter (Signed)
Requested Prescriptions  Pending Prescriptions Disp Refills  . rosuvastatin (CRESTOR) 20 MG tablet [Pharmacy Med Name: ROSUVASTATIN CALCIUM 20 MG TAB] 90 tablet 1    Sig: TAKE 1 TABLET BY MOUTH EVERY DAY     Cardiovascular:  Antilipid - Statins Failed - 10/17/2020 11:54 AM      Failed - LDL in normal range and within 360 days    LDL Chol Calc (NIH)  Date Value Ref Range Status  03/16/2020 55 0 - 99 mg/dL Final         Failed - HDL in normal range and within 360 days    HDL  Date Value Ref Range Status  03/16/2020 29 (L) >39 mg/dL Final         Failed - Triglycerides in normal range and within 360 days    Triglycerides  Date Value Ref Range Status  03/16/2020 227 (H) 0 - 149 mg/dL Final         Passed - Total Cholesterol in normal range and within 360 days    Cholesterol, Total  Date Value Ref Range Status  03/16/2020 121 100 - 199 mg/dL Final         Passed - Patient is not pregnant      Passed - Valid encounter within last 12 months    Recent Outpatient Visits          1 month ago Type 2 diabetes mellitus with other specified complication, without long-term current use of insulin ([place])   [facility] [name], MD   4 months ago Herpes zoster without complication   [facility] [name], MD   4 months ago Herpes zoster without complication   [facility] [name], MD   5 months ago Type 2 diabetes mellitus with other specified complication, without long-term current use of insulin [facility])   [facility] [name], MD   7 months ago Annual physical exam   [facility] [name], MD      Future Appointments            In 3 months [name], MD [facility]           . gabapentin (NEURONTIN) 300 MG capsule [Pharmacy Med Name: GABAPENTIN 300 MG CAPSULE] 60 capsule 3    Sig: TAKE 1 CAPSULE BY MOUTH  TWICE A DAY     Neurology: Anticonvulsants - gabapentin Passed - 10/17/2020 11:54 AM      Passed - Valid encounter within last 12 months    Recent Outpatient Visits          1 month ago Type 2 diabetes mellitus with other specified complication, without long-term current use of insulin [facility])   [facility] [name], MD   4 months ago Herpes zoster without complication   [facility] [name], MD   4 months ago Herpes zoster without complication   [facility] [name], MD   5 months ago Type 2 diabetes mellitus with other specified complication, without long-term current use of insulin [facility])   [facility] [name], MD   7 months ago Annual physical exam   [facility] [name], MD      Future Appointments            In 3 months Eulas Post  Brooke Bonito., MD [facility], [place]

## 2020-11-08 ENCOUNTER — Other Ambulatory Visit: Payer: Self-pay | Admitting: Family Medicine

## 2020-11-08 DIAGNOSIS — E1169 Type 2 diabetes mellitus with other specified complication: Secondary | ICD-10-CM

## 2020-11-08 NOTE — Telephone Encounter (Signed)
Requested Prescriptions  Pending Prescriptions Disp Refills  . Continuous Blood Gluc Sensor (FREESTYLE LIBRE 2 SENSOR) MISC [Pharmacy Med Name: FREESTYLE LIBRE 2 SENSOR] 2 each 1    Sig: USE AS DIRECTED CHANGE EVERY 85 DAYS     Endocrinology: Diabetes - Testing Supplies Passed - 11/08/2020 11:36 AM      Passed - Valid encounter within last 12 months    Recent Outpatient Visits          1 month ago Type 2 diabetes mellitus with other specified complication, without long-term current use of insulin [facility])   [facility] [name], MD   4 months ago Herpes zoster without complication   [facility] [name], MD   5 months ago Herpes zoster without complication   [facility] [name], MD   6 months ago Type 2 diabetes mellitus with other specified complication, without long-term current use of insulin [facility])   [facility] [name], MD   7 months ago Annual physical exam   [facility] [name], MD      Future Appointments            In 1 week [name], MD [facility] [place]   In 2 months [name], MD [facility], [place]

## 2020-11-15 ENCOUNTER — Ambulatory Visit (INDEPENDENT_AMBULATORY_CARE_PROVIDER_SITE_OTHER): Payer: HMO | Admitting: Family Medicine

## 2020-11-15 ENCOUNTER — Encounter: Payer: Self-pay | Admitting: Family Medicine

## 2020-11-15 ENCOUNTER — Other Ambulatory Visit: Payer: Self-pay

## 2020-11-15 VITALS — BP 131/84 | HR 89 | Temp 97.7°F | Resp 16 | Ht 72.0 in | Wt 243.0 lb

## 2020-11-15 DIAGNOSIS — E1142 Type 2 diabetes mellitus with diabetic polyneuropathy: Secondary | ICD-10-CM | POA: Diagnosis not present

## 2020-11-15 DIAGNOSIS — I10 Essential (primary) hypertension: Secondary | ICD-10-CM

## 2020-11-15 DIAGNOSIS — E6609 Other obesity due to excess calories: Secondary | ICD-10-CM

## 2020-11-15 DIAGNOSIS — Z6831 Body mass index (BMI) 31.0-31.9, adult: Secondary | ICD-10-CM | POA: Diagnosis not present

## 2020-11-15 DIAGNOSIS — K760 Fatty (change of) liver, not elsewhere classified: Secondary | ICD-10-CM

## 2020-11-15 NOTE — Progress Notes (Signed)
I,[name],acting as a scribe for [name], MD.,have documented all relevant documentation on the behalf of [name], MD,as directed by  [name], MD while in the presence of [name], MD.   Established patient visit   Patient: Manuel Stafford.   DOB: April 17, 1946   75 y.o. Male  MRN: [id number] Visit Date: 11/15/2020  Today's healthcare provider: [name], MD   Chief Complaint  Patient presents with  . Follow-up  . Diabetes   Subjective    HPI  Patient comes in today for follow-up of his diabetes.  He is doing very well but is very expensive. Diabetes Mellitus Type II, follow-up  Lab Results  Component Value Date   HGBA1C 6.5 (H) 09/12/2020   HGBA1C 7.0 (A) 05/11/2020   HGBA1C 7.1 (A) 02/09/2020   Last seen for diabetes 2 months ago.  Management since then includes continuing the same treatment. He reports good compliance with treatment. [employer] advised patient he can get a continuous glucose monitor. He is not having side effects. none  Home blood sugar records: fasting range: 133  Episodes of hypoglycemia? No n/a   Current insulin regiment: Ozempic Most Recent Eye Exam: 05/23/2020  --------------------------------------------------------------------  Patient wants to discuss Ozempic.      Medications: Outpatient Medications Prior to Visit  Medication Sig  . aspirin 81 MG tablet Take 81 mg by mouth daily.   . cetirizine (ZYRTEC) 10 MG tablet TAKE 1 TABLET BY MOUTH EVERY DAY  . Cholecalciferol (VITAMIN D3) 2000 UNITS capsule Take 2,000 Units by mouth daily.   . Continuous Blood Gluc Sensor (FREESTYLE LIBRE 2 SENSOR) MISC USE AS DIRECTED CHANGE EVERY 14 DAYS  . diphenhydrAMINE (BENADRYL) 25 mg capsule Take by mouth.   . doxycycline (VIBRA-TABS) 100 MG tablet Take 1 tablet (100 mg total) by mouth 2 (two) times daily.  [name] gabapentin (NEURONTIN) 300 MG capsule TAKE 1 CAPSULE BY MOUTH TWICE A  DAY  . hydrochlorothiazide (HYDRODIURIL) 25 MG tablet TAKE 1 TABLET BY MOUTH EVERY DAY  . ketoconazole (NIZORAL) 2 % cream Apply to feet and between toes QHS.  [name] meclizine (ANTIVERT) 25 MG tablet Take 2 tablets (50 mg total) by mouth 3 (three) times daily as needed for dizziness.  . metFORMIN (GLUCOPHAGE) 1000 MG tablet TAKE 1 TABLET (1,000 MG TOTAL) BY MOUTH 2 (TWO) TIMES DAILY WITH A MEAL.  . MULTIPLE VITAMIN PO Take by mouth daily.   [name] omeprazole (PRILOSEC) 20 MG capsule TAKE 1 CAPSULE BY MOUTH EVERY DAY  . ONETOUCH ULTRA test strip USE DAILY AS DIRECTED  . pioglitazone (ACTOS) 15 MG tablet TAKE 1 TABLET BY MOUTH DAILY  . rOPINIRole (REQUIP) 0.25 MG tablet Take by mouth daily.   . rosuvastatin (CRESTOR) 20 MG tablet TAKE 1 TABLET BY MOUTH EVERY DAY  . Saw Palmetto, Serenoa repens, (SAW PALMETTO BERRY) 160 MG CAPS Take 2 tablets by mouth daily.   . Semaglutide,0.25 or 0.5MG /DOS, (OZEMPIC, 0.25 OR 0.5 MG/DOSE,) 2 MG/1.5ML SOPN Inject 0.5 mg into the skin once a week.  . tadalafil (CIALIS) 20 MG tablet Take 0.5-1 tablets (10-20 mg total) by mouth every other day as needed for erectile dysfunction.  [name] telmisartan (MICARDIS) 40 MG tablet TAKE 1 TABLET BY MOUTH EVERY DAY  . tiZANidine (ZANAFLEX) 4 MG tablet TAKE 1 TABLET (4 MG TOTAL) BY MOUTH AT BEDTIME.  . traMADol (ULTRAM) 50 MG tablet TAKE 1 TABLET BY MOUTH 3 TIMES A DAY  AS NEEDED FOR BACK OR LEG PAIN  . valACYclovir (VALTREX) 1000 MG tablet Take 1 tablet (1,000 mg total) by mouth 3 (three) times daily.   No facility-administered medications prior to visit.    Review of Systems  Constitutional: Negative for appetite change, chills and fever.  Respiratory: Negative for chest tightness, shortness of breath and wheezing.   Cardiovascular: Negative for chest pain and palpitations.  Gastrointestinal: Negative for abdominal pain, nausea and vomiting.        Objective    BP 131/84 (BP Location: Left Arm, Patient Position: Sitting, Cuff Size:  Large)   Pulse 89   Temp 97.7 F (36.5 C) (Oral)   Resp 16   Ht 6' (1.829 m)   Wt 243 lb (110.2 kg)   SpO2 95%   BMI 32.96 kg/m  BP Readings from Last 3 Encounters:  11/15/20 131/84  09/12/20 130/81  06/06/20 120/78   Wt Readings from Last 3 Encounters:  11/15/20 243 lb (110.2 kg)  09/12/20 243 lb (110.2 kg)  06/06/20 241 lb (109.3 kg)       Physical Exam Vitals reviewed.  Constitutional:      Appearance: Normal appearance. He is not diaphoretic.  HENT:     Head: Normocephalic and atraumatic.     Right Ear: External ear normal.     Left Ear: External ear normal.  Eyes:     General: No scleral icterus.    Conjunctiva/sclera: Conjunctivae normal.  Neck:     Vascular: No carotid bruit.  Cardiovascular:     Rate and Rhythm: Normal rate and regular rhythm.     Pulses: Normal pulses.     Heart sounds: Normal heart sounds.  Pulmonary:     Effort: Pulmonary effort is normal.     Breath sounds: Normal breath sounds.  Abdominal:     Palpations: Abdomen is soft.  Lymphadenopathy:     Cervical: No cervical adenopathy.  Skin:    General: Skin is warm and dry.  Neurological:     General: No focal deficit present.     Mental Status: He is alert and oriented to person, place, and time.  Psychiatric:        Mood and Affect: Mood normal.        Behavior: Behavior normal.        Thought Content: Thought content normal.        Judgment: Judgment normal.       No results found for any visits on 11/15/20.  Assessment & Plan     1. Type 2 diabetes mellitus with diabetic polyneuropathy, without long-term current use of insulin (HCC) Good control on metformin pioglitazone and Ozempic.  He is using freestyle libre continuous glucose monitor.  2. Benign essential HTN Good control  3. Fatty liver disease, nonalcoholic   4. Class 1 obesity due to excess calories with serious comorbidity and body mass index (BMI) of 31.0 to 31.9 in adult Patient continues work on diet and  exercise with weight loss   No follow-ups on file.      I, [name], MD, have reviewed all documentation for this visit. The documentation on 11/21/20 for the exam, diagnosis, procedures, and orders are all accurate and complete.    Lakendria Nicastro Cranford Mon, MD  [facility] [phone] (phone) [phone] (fax)  [place]

## 2020-11-18 ENCOUNTER — Ambulatory Visit: Payer: Self-pay

## 2020-11-18 DIAGNOSIS — E1169 Type 2 diabetes mellitus with other specified complication: Secondary | ICD-10-CM

## 2020-11-18 NOTE — Progress Notes (Signed)
[place] Patient Assistance form for Ozempic completed by patient and faxed for review on 11/18/20   Junius Argyle, PharmD, [place] [phone]

## 2020-11-25 ENCOUNTER — Other Ambulatory Visit: Payer: Self-pay | Admitting: Family Medicine

## 2020-11-25 DIAGNOSIS — M5136 Other intervertebral disc degeneration, lumbar region: Secondary | ICD-10-CM

## 2020-11-25 DIAGNOSIS — J309 Allergic rhinitis, unspecified: Secondary | ICD-10-CM

## 2020-11-25 NOTE — Telephone Encounter (Signed)
Requested medication (s) are due for refill today: Yes  Requested medication (s) are on the active medication list: Yes  Last refill:  09/05/20  Future visit scheduled: Yes  Notes to clinic:  See request    Requested Prescriptions  Pending Prescriptions Disp Refills   traMADol (ULTRAM) 50 MG tablet [Pharmacy Med Name: TRAMADOL HCL 50 MG TABLET] 180 tablet 0    Sig: TAKE 1 TABLET BY MOUTH 3 TIMES A DAY AS NEEDED FOR BACK OR LEG PAIN      Not Delegated - Analgesics:  Opioid Agonists Failed - 11/25/2020  2:16 PM      Failed - This refill cannot be delegated      Failed - Urine Drug Screen completed in last 360 days      Passed - Valid encounter within last 6 months    Recent Outpatient Visits           1 week ago Type 2 diabetes mellitus with diabetic polyneuropathy, without long-term current use of insulin ([place])   [facility] [name], MD   2 months ago Type 2 diabetes mellitus with other specified complication, without long-term current use of insulin ([place])   [facility] [name], MD   5 months ago Herpes zoster without complication   [facility] [name], MD   5 months ago Herpes zoster without complication   [facility] [name], MD   6 months ago Type 2 diabetes mellitus with other specified complication, without long-term current use of insulin [facility])   [facility] [name], MD       Future Appointments             In 2 months [name], MD [facility]              Signed Prescriptions Disp Refills   telmisartan (MICARDIS) 40 MG tablet 90 tablet 0    Sig: TAKE 1 TABLET BY MOUTH EVERY DAY      Cardiovascular:  Angiotensin Receptor Blockers Failed - 11/25/2020  2:16 PM      Failed - Cr in normal range and within 180 days    Creatinine, Ser  Date Value Ref Range Status  03/16/2020 1.44  (H) 0.76 - 1.27 mg/dL Final          Failed - K in normal range and within 180 days    Potassium  Date Value Ref Range Status  03/16/2020 4.4 3.5 - 5.2 mmol/L Final  07/10/2012 4.3 3.5 - 5.1 mmol/L Final          Passed - Patient is not pregnant      Passed - Last BP in normal range    BP Readings from Last 1 Encounters:  11/15/20 131/84          Passed - Valid encounter within last 6 months    Recent Outpatient Visits           1 week ago Type 2 diabetes mellitus with diabetic polyneuropathy, without long-term current use of insulin [facility])   [facility] [name], MD   2 months ago Type 2 diabetes mellitus with other specified complication, without long-term current use of insulin [facility])   [facility] [name], MD   5 months ago Herpes zoster without complication   [facility] [name], MD  5 months ago Herpes zoster without complication   [facility] [name], MD   6 months ago Type 2 diabetes mellitus with other specified complication, without long-term current use of insulin [facility])   [facility] [name], MD       Future Appointments             In 2 months [name], MD [facility]               pioglitazone (ACTOS) 15 MG tablet 90 tablet 0    Sig: TAKE 1 TABLET BY MOUTH EVERY DAY      Endocrinology:  Diabetes - Glitazones - pioglitazone Passed - 11/25/2020  2:16 PM      Passed - HBA1C is between 0 and 7.9 and within 180 days    Hgb A1c MFr Bld  Date Value Ref Range Status  09/12/2020 6.5 (H) 4.8 - 5.6 % Final    Comment:             Prediabetes: 5.7 - 6.4          Diabetes: >6.4          Glycemic control for adults with diabetes: <7.0           Passed - Valid encounter within last 6 months    Recent Outpatient Visits           1 week ago Type 2 diabetes mellitus with  diabetic polyneuropathy, without long-term current use of insulin ([place])   [facility] [name], MD   2 months ago Type 2 diabetes mellitus with other specified complication, without long-term current use of insulin [facility])   [facility] [name], MD   5 months ago Herpes zoster without complication   [facility] [name], MD   5 months ago Herpes zoster without complication   [facility] [name], MD   6 months ago Type 2 diabetes mellitus with other specified complication, without long-term current use of insulin [facility])   [facility] [name], MD       Future Appointments             In 2 months [name], MD [facility]               omeprazole (PRILOSEC) 20 MG capsule 90 capsule 0    Sig: TAKE 1 CAPSULE BY MOUTH EVERY DAY      Gastroenterology: Proton Pump Inhibitors Passed - 11/25/2020  2:16 PM      Passed - Valid encounter within last 12 months    Recent Outpatient Visits           1 week ago Type 2 diabetes mellitus with diabetic polyneuropathy, without long-term current use of insulin [facility])   [facility] [name], MD   2 months ago Type 2 diabetes mellitus with other specified complication, without long-term current use of insulin [facility])   [facility] [name], MD   5 months ago Herpes zoster without complication   [facility] [name], MD   5 months ago Herpes zoster without complication   [facility] [name], MD   6 months ago Type 2 diabetes mellitus with other specified complication, without long-term current use of insulin ([place])  [facility] [name], MD       Future Appointments             In 2 months [name], MD [facility]               cetirizine (ZYRTEC) 10 MG tablet 90 tablet 0    Sig: TAKE 1 TABLET BY MOUTH EVERY DAY      Ear, Nose, and Throat:  Antihistamines Passed - 11/25/2020  2:16 PM      Passed - Valid encounter within last 12 months    Recent Outpatient Visits           1 week ago Type 2 diabetes mellitus with diabetic polyneuropathy, without long-term current use of insulin [facility])   [facility] [name], MD   2 months ago Type 2 diabetes mellitus with other specified complication, without long-term current use of insulin [facility])   [facility] [name], MD   5 months ago Herpes zoster without complication   [facility] [name], MD   5 months ago Herpes zoster without complication   [facility] [name], MD   6 months ago Type 2 diabetes mellitus with other specified complication, without long-term current use of insulin [facility])   [facility] [name], MD       Future Appointments             In 2 months [name], MD [facility] [place]

## 2020-12-29 DIAGNOSIS — G2581 Restless legs syndrome: Secondary | ICD-10-CM | POA: Diagnosis not present

## 2021-01-02 ENCOUNTER — Telehealth: Payer: Self-pay

## 2021-01-02 NOTE — Chronic Care Management (AMB) (Signed)
    Chronic Care Management Pharmacy Assistant   Name: Manuel Stafford.  MRN: [id number] DOB: 1945/08/04   Reason for Encounter: Patient Assistance Coordination   01/02/2021- Patient assistance application filled out for Ozempic with [employer] patient assistance program. Application mailed to patient with instructions to return to PCP office for Dr. Rosanna Randy to sign and Cristie Hem to fax.   Medications: Outpatient Encounter Medications as of 01/02/2021  Medication Sig   aspirin 81 MG tablet Take 81 mg by mouth daily.    cetirizine (ZYRTEC) 10 MG tablet TAKE 1 TABLET BY MOUTH EVERY DAY   Cholecalciferol (VITAMIN D3) 2000 UNITS capsule Take 2,000 Units by mouth daily.    Continuous Blood Gluc Sensor (FREESTYLE LIBRE 2 SENSOR) MISC USE AS DIRECTED CHANGE EVERY 14 DAYS   diphenhydrAMINE (BENADRYL) 25 mg capsule Take by mouth.    doxycycline (VIBRA-TABS) 100 MG tablet Take 1 tablet (100 mg total) by mouth 2 (two) times daily.   gabapentin (NEURONTIN) 300 MG capsule TAKE 1 CAPSULE BY MOUTH TWICE A DAY   hydrochlorothiazide (HYDRODIURIL) 25 MG tablet TAKE 1 TABLET BY MOUTH EVERY DAY   ketoconazole (NIZORAL) 2 % cream Apply to feet and between toes QHS.   meclizine (ANTIVERT) 25 MG tablet Take 2 tablets (50 mg total) by mouth 3 (three) times daily as needed for dizziness.   metFORMIN (GLUCOPHAGE) 1000 MG tablet TAKE 1 TABLET (1,000 MG TOTAL) BY MOUTH 2 (TWO) TIMES DAILY WITH A MEAL.   MULTIPLE VITAMIN PO Take by mouth daily.    omeprazole (PRILOSEC) 20 MG capsule TAKE 1 CAPSULE BY MOUTH EVERY DAY   ONETOUCH ULTRA test strip USE DAILY AS DIRECTED   pioglitazone (ACTOS) 15 MG tablet TAKE 1 TABLET BY MOUTH EVERY DAY   rOPINIRole (REQUIP) 0.25 MG tablet Take by mouth daily.    rosuvastatin (CRESTOR) 20 MG tablet TAKE 1 TABLET BY MOUTH EVERY DAY   Saw Palmetto, Serenoa repens, (SAW PALMETTO BERRY) 160 MG CAPS Take 2 tablets by mouth daily.    Semaglutide,0.25 or 0.5MG /DOS, (OZEMPIC, 0.25 OR 0.5  MG/DOSE,) 2 MG/1.5ML SOPN Inject 0.5 mg into the skin once a week.   tadalafil (CIALIS) 20 MG tablet Take 0.5-1 tablets (10-20 mg total) by mouth every other day as needed for erectile dysfunction.   telmisartan (MICARDIS) 40 MG tablet TAKE 1 TABLET BY MOUTH EVERY DAY   tiZANidine (ZANAFLEX) 4 MG tablet TAKE 1 TABLET (4 MG TOTAL) BY MOUTH AT BEDTIME.   traMADol (ULTRAM) 50 MG tablet TAKE 1 TABLET BY MOUTH 3 TIMES A DAY AS NEEDED FOR BACK OR LEG PAIN   valACYclovir (VALTREX) 1000 MG tablet Take 1 tablet (1,000 mg total) by mouth 3 (three) times daily.   No facility-administered encounter medications on file as of 01/02/2021.    Care Gaps: Zoster Vaccines- Shingrix (1 of 2) COVID-19 Vaccine (4 - Booster for [employer] series)-Last completed: Apr 28, 2020 FOOT EXAM [facility])-  Last ordered: Mar 03, 2020 OVERDUE Annual Wellness Visit- 03/14/2020.  Star Rating Drugs: Telmisartan 40 mg- Last filled 11/25/2020 or a 90 day supply at [place] filled 11/01/2020 for a 28 day supply at [facility] Rosuvastatin 20 mg- Last filled 10/17/2020 or a 90 day supply at [facility] Actos 15 mg- Last filled 11/25/2020 for a 90 day supply at [facility] Metformin 1000 mg- Last filled 12/22/2020 or a 90 day supply at [place], [place] Pharmacist Assistant [phone]

## 2021-01-05 ENCOUNTER — Other Ambulatory Visit: Payer: Self-pay | Admitting: Family Medicine

## 2021-01-05 DIAGNOSIS — E1169 Type 2 diabetes mellitus with other specified complication: Secondary | ICD-10-CM

## 2021-01-08 ENCOUNTER — Other Ambulatory Visit: Payer: Self-pay | Admitting: Family Medicine

## 2021-01-08 DIAGNOSIS — M5136 Other intervertebral disc degeneration, lumbar region: Secondary | ICD-10-CM

## 2021-01-08 DIAGNOSIS — J309 Allergic rhinitis, unspecified: Secondary | ICD-10-CM

## 2021-01-08 NOTE — Telephone Encounter (Signed)
Requested Prescriptions  Pending Prescriptions Disp Refills  . tiZANidine (ZANAFLEX) 4 MG tablet [Pharmacy Med Name: TIZANIDINE HCL 4 MG TABLET] 90 tablet     Sig: TAKE 1 TABLET BY MOUTH AT BEDTIME.     Not Delegated - Cardiovascular:  Alpha-2 Agonists - tizanidine Failed - 01/08/2021  8:40 AM      Failed - This refill cannot be delegated      Passed - Valid encounter within last 6 months    Recent Outpatient Visits          1 month ago Type 2 diabetes mellitus with diabetic polyneuropathy, without long-term current use of insulin [facility])   [facility] [name] MD   3 months ago Type 2 diabetes mellitus with other specified complication, without long-term current use of insulin [facility])   [facility] [name] MD   6 months ago Herpes zoster without complication   [facility] [name] MD   7 months ago Herpes zoster without complication   [facility] [name], MD   8 months ago Type 2 diabetes mellitus with other specified complication, without long-term current use of insulin [facility])   [facility] [name], MD      Future Appointments            In 1 month [name], MD [facility]           . omeprazole (PRILOSEC) 20 MG capsule [Pharmacy Med Name: OMEPRAZOLE DR 20 MG CAPSULE] 90 capsule     Sig: TAKE 1 CAPSULE BY MOUTH EVERY DAY     Gastroenterology: Proton Pump Inhibitors Passed - 01/08/2021  8:40 AM      Passed - Valid encounter within last 12 months    Recent Outpatient Visits          1 month ago Type 2 diabetes mellitus with diabetic polyneuropathy, without long-term current use of insulin [facility])   [name] MD   3 months ago Type 2 diabetes mellitus with other specified complication, without long-term current use of insulin [facility])   [facility]  [name], MD   6 months ago Herpes zoster without complication   [facility] [name], MD   7 months ago Herpes zoster without complication   [facility] [name], MD   8 months ago Type 2 diabetes mellitus with other specified complication, without long-term current use of insulin [facility])   [facility] [name], MD      Future Appointments            In 1 month [name], MD [facility], [facility]           . cetirizine (ZYRTEC) 10 MG tablet [Pharmacy Med Name: CETIRIZINE HCL 10 MG TABLET] 90 tablet     Sig: TAKE 1 TABLET BY MOUTH EVERY DAY     Ear, Nose, and Throat:  Antihistamines Passed - 01/08/2021  8:40 AM      Passed - Valid encounter within last 12 months    Recent Outpatient Visits          1 month ago Type 2 diabetes mellitus with diabetic polyneuropathy, without long-term current use of insulin [facility])   [facility] [name] MD   3 months ago  Type 2 diabetes mellitus with other specified complication, without long-term current use of insulin [facility])   [name] MD   6 months ago Herpes zoster without complication   [facility] [name], MD   7 months ago Herpes zoster without complication   [facility] [name], MD   8 months ago Type 2 diabetes mellitus with other specified complication, without long-term current use of insulin [facility])   [facility] [name], MD      Future Appointments            In 1 month [name], MD [facility]           . pioglitazone (ACTOS) 15 MG tablet [Pharmacy Med Name: PIOGLITAZONE HCL 15 MG TABLET] 90 tablet     Sig: TAKE 1 TABLET BY MOUTH EVERY DAY     Endocrinology:  Diabetes - Glitazones - pioglitazone Passed - 01/08/2021   8:40 AM      Passed - HBA1C is between 0 and 7.9 and within 180 days    Hgb A1c MFr Bld  Date Value Ref Range Status  09/12/2020 6.5 (H) 4.8 - 5.6 % Final    Comment:             Prediabetes: 5.7 - 6.4          Diabetes: >6.4          Glycemic control for adults with diabetes: <7.0          Passed - Valid encounter within last 6 months    Recent Outpatient Visits          1 month ago Type 2 diabetes mellitus with diabetic polyneuropathy, without long-term current use of insulin ([place])   [facility] [name] MD   3 months ago Type 2 diabetes mellitus with other specified complication, without long-term current use of insulin [facility])   [facility] [name], MD   6 months ago Herpes zoster without complication   [facility] [name] MD   7 months ago Herpes zoster without complication   [facility] [name] MD   8 months ago Type 2 diabetes mellitus with other specified complication, without long-term current use of insulin [facility])   [facility] [name], MD      Future Appointments            In 1 month [name], MD [facility]           . metFORMIN (GLUCOPHAGE) 1000 MG tablet [Pharmacy Med Name: METFORMIN HCL 1,000 MG TABLET] 180 tablet     Sig: TAKE 1 TABLET (1,000 MG TOTAL) BY MOUTH 2 (TWO) TIMES DAILY WITH A MEAL.     Endocrinology:  Diabetes - Biguanides Failed - 01/08/2021  8:40 AM      Failed - Cr in normal range and within 360 days    Creatinine, Ser  Date Value Ref Range Status  03/16/2020 1.44 (H) 0.76 - 1.27 mg/dL Final         Failed - eGFR in normal range and within 360 days    GFR calc Af Amer  Date Value Ref Range Status  03/16/2020 55 (L) >59 mL/min/1.73 Final    Comment:    **[facility] currently reports eGFR in compliance with  the current**   recommendations of the [facility]. [facility] will   update reporting as new guidelines are published from the NKF-ASN   Task force.    GFR calc non Af Amer  Date Value Ref Range Status  03/16/2020 47 (L) >59 mL/min/1.73 Final         Passed - HBA1C is between 0 and 7.9 and within 180 days    Hgb A1c MFr Bld  Date Value Ref Range Status  09/12/2020 6.5 (H) 4.8 - 5.6 % Final    Comment:             Prediabetes: 5.7 - 6.4          Diabetes: >6.4          Glycemic control for adults with diabetes: <7.0          Passed - Valid encounter within last 6 months    Recent Outpatient Visits          1 month ago Type 2 diabetes mellitus with diabetic polyneuropathy, without long-term current use of insulin ([place])   [facility] [name], MD   3 months ago Type 2 diabetes mellitus with other specified complication, without long-term current use of insulin ([place])   [facility] [name], MD   6 months ago Herpes zoster without complication   [facility] [name], MD   7 months ago Herpes zoster without complication   [facility] [name], MD   8 months ago Type 2 diabetes mellitus with other specified complication, without long-term current use of insulin [facility])   [name]      Future Appointments            In 1 month [name], MD [facility]           . telmisartan (MICARDIS) 40 MG tablet [Pharmacy Med Name: TELMISARTAN 40 MG TABLET] 90 tablet     Sig: TAKE 1 TABLET BY MOUTH EVERY DAY     Cardiovascular:  Angiotensin Receptor Blockers Failed - 01/08/2021  8:40 AM      Failed - Cr in normal range and within 180 days    Creatinine, Ser  Date Value Ref Range Status  03/16/2020 1.44 (H) 0.76 - 1.27 mg/dL Final         Failed - K in normal range and within 180 days    Potassium  Date Value Ref Range Status   03/16/2020 4.4 3.5 - 5.2 mmol/L Final  07/10/2012 4.3 3.5 - 5.1 mmol/L Final         Passed - Patient is not pregnant      Passed - Last BP in normal range    BP Readings from Last 1 Encounters:  11/15/20 131/84         Passed - Valid encounter within last 6 months    Recent Outpatient Visits          1 month ago Type 2 diabetes mellitus with diabetic polyneuropathy, without long-term current use of insulin [facility])   [facility] [name], MD   3 months ago Type 2 diabetes mellitus with other specified complication, without long-term current use of insulin [facility])   [facility] [name], MD   6 months ago Herpes zoster without complication   [facility]  Kaylyn Lim., MD   7 months ago Herpes zoster without complication   [facility] [name], MD   8 months ago Type 2 diabetes mellitus with other specified complication, without long-term current use of insulin [facility])   [facility] [name], MD      Future Appointments            In 1 month [name], MD [facility] [place]

## 2021-01-08 NOTE — Telephone Encounter (Signed)
Requested medication (s) are due for refill today: no  Requested medication (s) are on the active medication list: yes  Last refill:  06/06/20  Future visit scheduled: yes  Notes to clinic:  med not delegated for NT to RF   Requested Prescriptions  Pending Prescriptions Disp Refills   tiZANidine (ZANAFLEX) 4 MG tablet [Pharmacy Med Name: TIZANIDINE HCL 4 MG TABLET] 90 tablet     Sig: TAKE 1 TABLET BY MOUTH AT BEDTIME.      Not Delegated - Cardiovascular:  Alpha-2 Agonists - tizanidine Failed - 01/08/2021  8:40 AM      Failed - This refill cannot be delegated      Passed - Valid encounter within last 6 months    Recent Outpatient Visits           1 month ago Type 2 diabetes mellitus with diabetic polyneuropathy, without long-term current use of insulin ([place])   [facility] [name]   3 months ago Type 2 diabetes mellitus with other specified complication, without long-term current use of insulin [facility])   [facility] [name], MD   6 months ago Herpes zoster without complication   [facility] [name], MD   7 months ago Herpes zoster without complication   [facility] [name], MD   8 months ago Type 2 diabetes mellitus with other specified complication, without long-term current use of insulin [facility])   [facility] [name], MD       Future Appointments             In 1 month [name], MD [facility]              Refused Prescriptions Disp Refills   omeprazole (PRILOSEC) 20 MG capsule [Pharmacy Med Name: OMEPRAZOLE DR 20 MG CAPSULE] 90 capsule     Sig: TAKE 1 CAPSULE BY MOUTH EVERY DAY      Gastroenterology: Proton Pump Inhibitors Passed - 01/08/2021  8:40 AM      Passed - Valid encounter within last 12 months    Recent Outpatient Visits           1 month ago Type 2 diabetes mellitus  with diabetic polyneuropathy, without long-term current use of insulin ([place])   [facility] [name], MD   3 months ago Type 2 diabetes mellitus with other specified complication, without long-term current use of insulin [facility])   [facility] [name], MD   6 months ago Herpes zoster without complication   [facility] [name], MD   7 months ago Herpes zoster without complication   [facility] [name], MD   8 months ago Type 2 diabetes mellitus with other specified complication, without long-term current use of insulin [facility])   [facility] [name], MD       Future Appointments             In 1 month [name], MD [facility]               cetirizine (ZYRTEC) 10 MG tablet [Pharmacy Med Name: CETIRIZINE HCL 10 MG TABLET] 90 tablet     Sig: TAKE 1 TABLET BY MOUTH EVERY DAY      Ear, Nose, and Throat:  Antihistamines Passed -  01/08/2021  8:40 AM      Passed - Valid encounter within last 12 months    Recent Outpatient Visits           1 month ago Type 2 diabetes mellitus with diabetic polyneuropathy, without long-term current use of insulin ([place])   [facility] [name], MD   3 months ago Type 2 diabetes mellitus with other specified complication, without long-term current use of insulin ([place])   [facility] [name] MD   6 months ago Herpes zoster without complication   [facility] [name], MD   7 months ago Herpes zoster without complication   [facility] [name], MD   8 months ago Type 2 diabetes mellitus with other specified complication, without long-term current use of insulin [facility])   [facility] [name], MD       Future Appointments             In 1  month [name], MD [facility]               pioglitazone (ACTOS) 15 MG tablet [Pharmacy Med Name: PIOGLITAZONE HCL 15 MG TABLET] 90 tablet     Sig: TAKE 1 TABLET BY MOUTH EVERY DAY      Endocrinology:  Diabetes - Glitazones - pioglitazone Passed - 01/08/2021  8:40 AM      Passed - HBA1C is between 0 and 7.9 and within 180 days    Hgb A1c MFr Bld  Date Value Ref Range Status  09/12/2020 6.5 (H) 4.8 - 5.6 % Final    Comment:             Prediabetes: 5.7 - 6.4          Diabetes: >6.4          Glycemic control for adults with diabetes: <7.0           Passed - Valid encounter within last 6 months    Recent Outpatient Visits           1 month ago Type 2 diabetes mellitus with diabetic polyneuropathy, without long-term current use of insulin ([place])   [facility] [name] MD   3 months ago Type 2 diabetes mellitus with other specified complication, without long-term current use of insulin [facility])   [facility] [name], MD   6 months ago Herpes zoster without complication   [facility] [name], MD   7 months ago Herpes zoster without complication   [facility] [name], MD   8 months ago Type 2 diabetes mellitus with other specified complication, without long-term current use of insulin [facility])   [facility] [name], MD       Future Appointments             In 1 month [name], MD [facility], [facility]               metFORMIN (GLUCOPHAGE) 1000 MG tablet [Pharmacy Med Name: METFORMIN HCL 1,000 MG TABLET] 180 tablet     Sig: TAKE 1 TABLET (1,000 MG TOTAL) BY MOUTH 2 (TWO) TIMES DAILY WITH A MEAL.      Endocrinology:  Diabetes - Biguanides Failed - 01/08/2021  8:40 AM      Failed - Cr  in normal range and within 360 days    Creatinine, Ser  Date Value Ref Range Status   03/16/2020 1.44 (H) 0.76 - 1.27 mg/dL Final          Failed - eGFR in normal range and within 360 days    GFR calc Af Amer  Date Value Ref Range Status  03/16/2020 55 (L) >59 mL/min/1.73 Final    Comment:    **[facility] currently reports eGFR in compliance with the current**   recommendations of the [employer]. [facility] will   update reporting as new guidelines are published from the NKF-ASN   Task force.    GFR calc non Af Amer  Date Value Ref Range Status  03/16/2020 47 (L) >59 mL/min/1.73 Final          Passed - HBA1C is between 0 and 7.9 and within 180 days    Hgb A1c MFr Bld  Date Value Ref Range Status  09/12/2020 6.5 (H) 4.8 - 5.6 % Final    Comment:             Prediabetes: 5.7 - 6.4          Diabetes: >6.4          Glycemic control for adults with diabetes: <7.0           Passed - Valid encounter within last 6 months    Recent Outpatient Visits           1 month ago Type 2 diabetes mellitus with diabetic polyneuropathy, without long-term current use of insulin ([place])   [facility] [name] MD   3 months ago Type 2 diabetes mellitus with other specified complication, without long-term current use of insulin ([place])   [facility] [name], MD   6 months ago Herpes zoster without complication   [facility] [name] MD   7 months ago Herpes zoster without complication   [facility] [name] MD   8 months ago Type 2 diabetes mellitus with other specified complication, without long-term current use of insulin [facility])   [facility] [name], MD       Future Appointments             In 1 month [name], MD [facility], [facility]               telmisartan (MICARDIS) 40 MG tablet [Pharmacy Med Name: TELMISARTAN 40 MG TABLET] 90 tablet     Sig: TAKE 1 TABLET BY MOUTH EVERY DAY       Cardiovascular:  Angiotensin Receptor Blockers Failed - 01/08/2021  8:40 AM      Failed - Cr in normal range and within 180 days    Creatinine, Ser  Date Value Ref Range Status  03/16/2020 1.44 (H) 0.76 - 1.27 mg/dL Final          Failed - K in normal range and within 180 days    Potassium  Date Value Ref Range Status  03/16/2020 4.4 3.5 - 5.2 mmol/L Final  07/10/2012 4.3 3.5 - 5.1 mmol/L Final          Passed - Patient is not pregnant      Passed - Last BP in normal range    BP Readings from Last 1 Encounters:  11/15/20 131/84          Passed - Valid encounter  within last 6 months    Recent Outpatient Visits           1 month ago Type 2 diabetes mellitus with diabetic polyneuropathy, without long-term current use of insulin [facility])   [facility] [name], MD   3 months ago Type 2 diabetes mellitus with other specified complication, without long-term current use of insulin [facility])   [facility] [name] MD   6 months ago Herpes zoster without complication   [facility] [name], MD   7 months ago Herpes zoster without complication   [facility] [name], MD   8 months ago Type 2 diabetes mellitus with other specified complication, without long-term current use of insulin [facility])   [facility] [name], MD       Future Appointments             In 1 month [name], MD [facility]

## 2021-01-10 NOTE — Telephone Encounter (Signed)
Please advise refill?  LOV: 11/15/2020 NOV: 02/16/2021 Last refill: 06/06/2020 # 90 with 1/refill

## 2021-01-16 ENCOUNTER — Ambulatory Visit: Payer: Self-pay | Admitting: Family Medicine

## 2021-02-14 ENCOUNTER — Telehealth: Payer: Self-pay

## 2021-02-14 NOTE — Telephone Encounter (Signed)
Copied from [place] [id number]. Topic: General - Other >> Feb 14, 2021 11:11 AM Pawlus, Brayton Layman A wrote: Reason for CRM: Pt requested to speak directly with Arbie Cookey, please call back.

## 2021-02-15 NOTE — Progress Notes (Signed)
Established patient visit   Patient: Manuel Stafford.   DOB: 04-26-1946   75 y.o. 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  She died this past weekend . He needs refills on freestyle libre to continuous glucose monitor is the one he has now has to be renewed every 2 weeks. He has had 3 COVID vaccines. Patient is sleeping but not resting well.  He has right shoulder aches made worse by sleeping and recliner when his wife is at hospice Diabetes Mellitus Type II, Follow-up  Lab Results  Component Value Date   HGBA1C 6.6 (A) 02/16/2021   HGBA1C 6.5 (H) 09/12/2020   HGBA1C 7.0 (A) 05/11/2020   Wt Readings from Last 3 Encounters:  02/16/21 235 lb (106.6 kg)  11/15/20 243 lb (110.2 kg)  09/12/20 243 lb (110.2 kg)   Last seen for diabetes 5 months ago.  Management since then includes no medication changes. He reports good compliance with treatment. He is not having side effects.  Symptoms: Yes fatigue No foot ulcerations  Yes appetite changes No nausea  No paresthesia of the feet  No polydipsia  No polyuria No visual disturbances   No vomiting     Home blood sugar records:  not being checked  Episodes of hypoglycemia? No    Current insulin regiment: none Most Recent Eye Exam: up to date Current exercise: none Current diet habits: in general, an "unhealthy" diet  Pertinent Labs: Lab Results  Component Value Date   CHOL 121 03/16/2020   HDL 29 (L) 03/16/2020   LDLCALC 55 03/16/2020   TRIG 227 (H) 03/16/2020   CHOLHDL 4.2 03/16/2020   Lab Results  Component Value Date   NA 137 03/16/2020   K 4.4 03/16/2020   CREATININE  1.44 (H) 03/16/2020   GFRNONAA 47 (L) 03/16/2020   GFRAA 55 (L) 03/16/2020   GLUCOSE 163 (H) 03/16/2020         Medications: Outpatient Medications Prior to Visit  Medication Sig   aspirin 81 MG tablet Take 81 mg by mouth daily.    cetirizine (ZYRTEC) 10 MG tablet TAKE 1 TABLET BY MOUTH EVERY DAY   Cholecalciferol (VITAMIN D3) 2000 UNITS capsule Take 2,000 Units by mouth daily.    Continuous Blood Gluc Sensor (FREESTYLE LIBRE 2 SENSOR) MISC USE AS DIRECTED CHANGE EVERY 14 DAYS   diphenhydrAMINE (BENADRYL) 25 mg capsule Take by mouth.    gabapentin (NEURONTIN) 300 MG capsule TAKE 1 CAPSULE BY MOUTH TWICE A DAY   hydrochlorothiazide (HYDRODIURIL) 25 MG tablet TAKE 1 TABLET BY MOUTH EVERY DAY   ketoconazole (NIZORAL) 2 % cream Apply to feet and between toes QHS.   meclizine (ANTIVERT) 25 MG tablet Take 2 tablets (50 mg total) by mouth 3 (three) times daily as needed for dizziness.   metFORMIN (GLUCOPHAGE) 1000 MG tablet TAKE 1 TABLET (1,000 MG TOTAL) BY MOUTH 2 (TWO) TIMES DAILY WITH A MEAL.   MULTIPLE VITAMIN PO Take by mouth daily.    omeprazole (PRILOSEC) 20 MG capsule TAKE 1 CAPSULE BY MOUTH EVERY DAY   ONETOUCH ULTRA test strip USE DAILY AS DIRECTED   pioglitazone (ACTOS) 15 MG tablet TAKE 1 TABLET BY MOUTH EVERY DAY   rOPINIRole (REQUIP) 0.25 MG  tablet Take by mouth daily.    rosuvastatin (CRESTOR) 20 MG tablet TAKE 1 TABLET BY MOUTH EVERY DAY   Saw Palmetto, Serenoa repens, (SAW PALMETTO BERRY) 160 MG CAPS Take 2 tablets by mouth daily.    Semaglutide,0.25 or 0.'5MG'$ /DOS, (OZEMPIC, 0.25 OR 0.5 MG/DOSE,) 2 MG/1.5ML SOPN Inject 0.5 mg into the skin once a week.   tadalafil (CIALIS) 20 MG tablet Take 0.5-1 tablets (10-20 mg total) by mouth every other day as needed for erectile dysfunction.   telmisartan (MICARDIS) 40 MG tablet TAKE 1 TABLET BY MOUTH EVERY DAY   tiZANidine (ZANAFLEX) 4 MG tablet TAKE 1 TABLET BY MOUTH AT BEDTIME.   traMADol (ULTRAM) 50 MG tablet TAKE 1 TABLET BY MOUTH  3 TIMES A DAY AS NEEDED FOR BACK OR LEG PAIN   valACYclovir (VALTREX) 1000 MG tablet Take 1 tablet (1,000 mg total) by mouth 3 (three) times daily.   doxycycline (VIBRA-TABS) 100 MG tablet Take 1 tablet (100 mg total) by mouth 2 (two) times daily. (Patient not taking: Reported on 02/16/2021)   No facility-administered medications prior to visit.    Review of Systems  Constitutional:  Positive for appetite change. Negative for activity change and fatigue.  Respiratory:  Negative for stridor.   Cardiovascular:  Negative for chest pain, palpitations and leg swelling.  Endocrine: Negative for cold intolerance, heat intolerance, polydipsia, polyphagia and polyuria.  Neurological:  Negative for dizziness and headaches.       Objective  -------------------------------------------------------------------------------------------------------------------- BP 115/71   Pulse (!) 102   Resp 16   Ht '6\' 1"'$  (1.854 m)   Wt 235 lb (106.6 kg)   BMI 31.00 kg/m      Physical Exam Vitals reviewed.  Constitutional:      Appearance: Normal appearance. He is not diaphoretic.  HENT:     Head: Normocephalic and atraumatic.     Right Ear: External ear normal.     Left Ear: External ear normal.  Eyes:     General: No scleral icterus.    Conjunctiva/sclera: Conjunctivae normal.  Neck:     Vascular: No carotid bruit.  Cardiovascular:     Rate and Rhythm: Normal rate and regular rhythm.     Pulses: Normal pulses.     Heart sounds: Normal heart sounds.  Pulmonary:     Effort: Pulmonary effort is normal.     Breath sounds: Normal breath sounds.  Abdominal:     Palpations: Abdomen is soft.  Lymphadenopathy:     Cervical: No cervical adenopathy.  Skin:    General: Skin is warm and dry.  Neurological:     General: No focal deficit present.     Mental Status: He is alert and oriented to person, place, and time.  Psychiatric:        Mood and Affect: Mood normal.        Behavior: Behavior normal.         Thought Content: Thought content normal.        Judgment: Judgment normal.      Results for orders placed or performed in visit on 02/16/21  POCT glycosylated hemoglobin (Hb A1C)  Result Value Ref Range   Hemoglobin A1C 6.6 (A) 4.0 - 5.6 %   HbA1c POC (<> result, manual entry)     HbA1c, POC (prediabetic range)     HbA1c, POC (controlled diabetic range)      Assessment & Plan  ---------------------------------------------------------------------------------------------------------------------- 1. Type 2 diabetes mellitus with diabetic polyneuropathy, without long-term current use of  insulin ([place]) Reorder prescription for continuous glucose monitor freestyle libre to - POCT glycosylated hemoglobin (Hb A1C)  2. Type 2 diabetes mellitus with other specified complication, without long-term current use of insulin ([facility])  - Continuous Blood Gluc Sensor (FREESTYLE LIBRE 2 SENSOR) MISC; Use as directed. Continuous blood sensor.  Dispense: 1 each; Refill: 1 Treated well with metformin and Ozempic 3. Benign essential HTN Good control on HCTZ telmisartan  4. Obstructive sleep apnea syndrome   5. Gastroesophageal reflux disease, unspecified whether esophagitis present   6. Class 1 obesity due to excess calories with serious comorbidity and body mass index (BMI) of 31.0 to 31.9 in adult   7. Avitaminosis D   8. Disorder of shoulder   9. Other hyperlipidemia On rosuvastatin 20 10.  Grieving Patient appropriately mourning the death of his wife  No follow-ups on file.      I, [name], MD, have reviewed all documentation for this visit. The documentation on 02/24/21 for the exam, diagnosis, procedures, and orders are all accurate and complete.    Torre Schaumburg Cranford Mon, MD  [facility] [phone] (phone) [phone] (fax)  [place]

## 2021-02-16 ENCOUNTER — Other Ambulatory Visit: Payer: Self-pay

## 2021-02-16 ENCOUNTER — Encounter: Payer: Self-pay | Admitting: Family Medicine

## 2021-02-16 ENCOUNTER — Ambulatory Visit (INDEPENDENT_AMBULATORY_CARE_PROVIDER_SITE_OTHER): Payer: HMO | Admitting: Family Medicine

## 2021-02-16 VITALS — BP 115/71 | HR 102 | Resp 16 | Ht 73.0 in | Wt 235.0 lb

## 2021-02-16 DIAGNOSIS — E1142 Type 2 diabetes mellitus with diabetic polyneuropathy: Secondary | ICD-10-CM | POA: Diagnosis not present

## 2021-02-16 DIAGNOSIS — E559 Vitamin D deficiency, unspecified: Secondary | ICD-10-CM | POA: Diagnosis not present

## 2021-02-16 DIAGNOSIS — M259 Joint disorder, unspecified: Secondary | ICD-10-CM

## 2021-02-16 DIAGNOSIS — G4733 Obstructive sleep apnea (adult) (pediatric): Secondary | ICD-10-CM | POA: Diagnosis not present

## 2021-02-16 DIAGNOSIS — E6609 Other obesity due to excess calories: Secondary | ICD-10-CM | POA: Diagnosis not present

## 2021-02-16 DIAGNOSIS — I10 Essential (primary) hypertension: Secondary | ICD-10-CM

## 2021-02-16 DIAGNOSIS — Z6831 Body mass index (BMI) 31.0-31.9, adult: Secondary | ICD-10-CM | POA: Diagnosis not present

## 2021-02-16 DIAGNOSIS — K219 Gastro-esophageal reflux disease without esophagitis: Secondary | ICD-10-CM

## 2021-02-16 DIAGNOSIS — E1169 Type 2 diabetes mellitus with other specified complication: Secondary | ICD-10-CM | POA: Diagnosis not present

## 2021-02-16 DIAGNOSIS — E7849 Other hyperlipidemia: Secondary | ICD-10-CM | POA: Diagnosis not present

## 2021-02-16 LAB — POCT GLYCOSYLATED HEMOGLOBIN (HGB A1C): Hemoglobin A1C: 6.6 % — AB (ref 4.0–5.6)

## 2021-02-16 MED ORDER — FREESTYLE LIBRE 2 SENSOR MISC: 1 refills | Status: DC

## 2021-02-16 NOTE — Patient Instructions (Signed)
Get Covid booster and Shringrix at the pharmacy.

## 2021-03-08 ENCOUNTER — Other Ambulatory Visit: Payer: Self-pay | Admitting: Family Medicine

## 2021-03-08 DIAGNOSIS — J309 Allergic rhinitis, unspecified: Secondary | ICD-10-CM

## 2021-03-08 DIAGNOSIS — M5136 Other intervertebral disc degeneration, lumbar region: Secondary | ICD-10-CM

## 2021-03-08 NOTE — Telephone Encounter (Signed)
Requested medications are due for refill today.  yes  Requested medications are on the active medications list.  yes  Last refill. Metformin 03/21/2020, Telmisartan 11/25/2020  Future visit scheduled.   yes  Notes to clinic.  Labs are expired.

## 2021-03-08 NOTE — Telephone Encounter (Signed)
Requested medications are due for refill today.  Yes  Requested medications are on the active medications list.  yes  Last refill. 11/25/2020  Future visit scheduled.   yes  Notes to clinic.  Medication not delegated.

## 2021-03-10 NOTE — Telephone Encounter (Signed)
Please review. Thanks!  

## 2021-03-12 ENCOUNTER — Other Ambulatory Visit: Payer: Self-pay | Admitting: Family Medicine

## 2021-03-12 DIAGNOSIS — E1169 Type 2 diabetes mellitus with other specified complication: Secondary | ICD-10-CM

## 2021-03-13 NOTE — Telephone Encounter (Signed)
Requested medication (s) are due for refill today: yes  Requested medication (s) are on the active medication list: yes  Last refill:  11/25/20 #180  Future visit scheduled: yes  Notes to clinic:  med not delegated to NT to RF   Requested Prescriptions  Pending Prescriptions Disp Refills   traMADol (ULTRAM) 50 MG tablet [Pharmacy Med Name: TRAMADOL HCL 50 MG TABLET] 180 tablet 0    Sig: TAKE 1 TABLET BY MOUTH 3 TIMES A DAY AS NEEDED FOR BACK OR LEG PAIN     Not Delegated - Analgesics:  Opioid Agonists Failed - 03/13/2021 11:12 AM      Failed - This refill cannot be delegated      Failed - Urine Drug Screen completed in last 360 days      Passed - Valid encounter within last 6 months    Recent Outpatient Visits           3 weeks ago Type 2 diabetes mellitus with diabetic polyneuropathy, without long-term current use of insulin ([place])   [facility] [name], MD   3 months ago Type 2 diabetes mellitus with diabetic polyneuropathy, without long-term current use of insulin [facility])   [facility] [name], MD   6 months ago Type 2 diabetes mellitus with other specified complication, without long-term current use of insulin [facility])   [facility] [name], MD   9 months ago Herpes zoster without complication   [facility] [name], MD   9 months ago Herpes zoster without complication   [facility] [name], MD       Future Appointments             In 4 months [name], MD [facility] [place]

## 2021-03-13 NOTE — Telephone Encounter (Signed)
Pt called to check status of refill, states he is in need.

## 2021-03-13 NOTE — Telephone Encounter (Signed)
Pt called to report that he is completely out of his current supply, he is requesting 90 day supply. Please advise

## 2021-04-02 ENCOUNTER — Emergency Department
Admission: EM | Admit: 2021-04-02 | Discharge: 2021-04-02 | Disposition: A | Discharge status: Home / Self Care | Payer: HMO | Attending: Emergency Medicine | Admitting: Emergency Medicine

## 2021-04-02 ENCOUNTER — Emergency Department: Payer: HMO

## 2021-04-02 DIAGNOSIS — R52 Pain, unspecified: Secondary | ICD-10-CM | POA: Diagnosis not present

## 2021-04-02 DIAGNOSIS — N390 Urinary tract infection, site not specified: Secondary | ICD-10-CM | POA: Diagnosis not present

## 2021-04-02 DIAGNOSIS — N200 Calculus of kidney: Secondary | ICD-10-CM

## 2021-04-02 DIAGNOSIS — Z7984 Long term (current) use of oral hypoglycemic drugs: Secondary | ICD-10-CM | POA: Insufficient documentation

## 2021-04-02 DIAGNOSIS — N201 Calculus of ureter: Secondary | ICD-10-CM | POA: Diagnosis not present

## 2021-04-02 DIAGNOSIS — R1084 Generalized abdominal pain: Secondary | ICD-10-CM | POA: Diagnosis not present

## 2021-04-02 DIAGNOSIS — N23 Unspecified renal colic: Secondary | ICD-10-CM

## 2021-04-02 DIAGNOSIS — K219 Gastro-esophageal reflux disease without esophagitis: Secondary | ICD-10-CM | POA: Diagnosis not present

## 2021-04-02 DIAGNOSIS — E119 Type 2 diabetes mellitus without complications: Secondary | ICD-10-CM | POA: Insufficient documentation

## 2021-04-02 DIAGNOSIS — I10 Essential (primary) hypertension: Secondary | ICD-10-CM | POA: Insufficient documentation

## 2021-04-02 DIAGNOSIS — Z87891 Personal history of nicotine dependence: Secondary | ICD-10-CM | POA: Diagnosis not present

## 2021-04-02 DIAGNOSIS — Z79899 Other long term (current) drug therapy: Secondary | ICD-10-CM | POA: Insufficient documentation

## 2021-04-02 DIAGNOSIS — R1032 Left lower quadrant pain: Secondary | ICD-10-CM | POA: Diagnosis present

## 2021-04-02 DIAGNOSIS — R0902 Hypoxemia: Secondary | ICD-10-CM | POA: Diagnosis not present

## 2021-04-02 DIAGNOSIS — Z7982 Long term (current) use of aspirin: Secondary | ICD-10-CM | POA: Diagnosis not present

## 2021-04-02 DIAGNOSIS — R109 Unspecified abdominal pain: Secondary | ICD-10-CM | POA: Diagnosis not present

## 2021-04-02 LAB — CBC WITH DIFFERENTIAL/PLATELET
Abs Immature Granulocytes: 0.03 10*3/uL (ref 0.00–0.07)
Abs Immature Granulocytes: 0.02 10*3/uL (ref 0.00–0.07)
Basophils Absolute: 0 10*3/uL (ref 0.0–0.1)
Basophils Absolute: 0 10*3/uL (ref 0.0–0.1)
Basophils Relative: 0 %
Basophils Relative: 0 %
Eosinophils Absolute: 0.2 10*3/uL (ref 0.0–0.5)
Eosinophils Absolute: 0.1 10*3/uL (ref 0.0–0.5)
Eosinophils Relative: 2 %
Eosinophils Relative: 1 %
HCT: 43.9 % (ref 39.0–52.0)
HCT: 40.4 % (ref 39.0–52.0)
Hemoglobin: 15 g/dL (ref 13.0–17.0)
Hemoglobin: 13.9 g/dL (ref 13.0–17.0)
Immature Granulocytes: 0 %
Immature Granulocytes: 0 %
Lymphocytes Relative: 25 %
Lymphocytes Relative: 26 %
Lymphs Abs: 1.9 10*3/uL (ref 0.7–4.0)
Lymphs Abs: 2.3 10*3/uL (ref 0.7–4.0)
MCH: 32.9 pg (ref 26.0–34.0)
MCH: 33.9 pg (ref 26.0–34.0)
MCHC: 34.2 g/dL (ref 30.0–36.0)
MCHC: 34.4 g/dL (ref 30.0–36.0)
MCV: 96.3 fL (ref 80.0–100.0)
MCV: 98.5 fL (ref 80.0–100.0)
Monocytes Absolute: 0.6 10*3/uL (ref 0.1–1.0)
Monocytes Absolute: 0.6 10*3/uL (ref 0.1–1.0)
Monocytes Relative: 8 %
Monocytes Relative: 6 %
Neutro Abs: 4.9 10*3/uL (ref 1.7–7.7)
Neutro Abs: 5.9 10*3/uL (ref 1.7–7.7)
Neutrophils Relative %: 65 %
Neutrophils Relative %: 67 %
Platelets: 170 10*3/uL (ref 150–400)
Platelets: 145 10*3/uL — ABNORMAL LOW (ref 150–400)
RBC: 4.56 MIL/uL (ref 4.22–5.81)
RBC: 4.1 MIL/uL — ABNORMAL LOW (ref 4.22–5.81)
RDW: 12.5 % (ref 11.5–15.5)
RDW: 12.4 % (ref 11.5–15.5)
WBC: 7.5 10*3/uL (ref 4.0–10.5)
WBC: 8.8 10*3/uL (ref 4.0–10.5)
nRBC: 0 % (ref 0.0–0.2)
nRBC: 0 % (ref 0.0–0.2)

## 2021-04-02 LAB — URINALYSIS, ROUTINE W REFLEX MICROSCOPIC
Bacteria, UA: NONE SEEN
Bilirubin Urine: NEGATIVE
Glucose, UA: NEGATIVE mg/dL
Ketones, ur: 5 mg/dL — AB
Leukocytes,Ua: NEGATIVE
Nitrite: NEGATIVE
Protein, ur: 30 mg/dL — AB
RBC / HPF: >50 RBC/hpf — ABNORMAL HIGH (ref 0–5)
Specific Gravity, Urine: 1.043 — ABNORMAL HIGH (ref 1.005–1.030)
pH: 5 (ref 5.0–8.0)

## 2021-04-02 LAB — COMPREHENSIVE METABOLIC PANEL
ALT: 26 U/L (ref 0–44)
AST: 32 U/L (ref 15–41)
Albumin: 4 g/dL (ref 3.5–5.0)
Alkaline Phosphatase: 86 U/L (ref 38–126)
Anion gap: 8 (ref 5–15)
BUN: 24 mg/dL — ABNORMAL HIGH (ref 8–23)
CO2: 28 mmol/L (ref 22–32)
Calcium: 9.6 mg/dL (ref 8.9–10.3)
Chloride: 103 mmol/L (ref 98–111)
Creatinine, Ser: 1.57 mg/dL — ABNORMAL HIGH (ref 0.61–1.24)
GFR, Estimated: 45 mL/min — ABNORMAL LOW (ref >60)
Glucose, Bld: 156 mg/dL — ABNORMAL HIGH (ref 70–99)
Potassium: 4.2 mmol/L (ref 3.5–5.1)
Sodium: 139 mmol/L (ref 135–145)
Total Bilirubin: 0.9 mg/dL (ref 0.3–1.2)
Total Protein: 7.1 g/dL (ref 6.5–8.1)

## 2021-04-02 LAB — LACTIC ACID, PLASMA
Lactic Acid, Venous: 2.5 mmol/L (ref 0.5–1.9)
Lactic Acid, Venous: 2.7 mmol/L (ref 0.5–1.9)
Lactic Acid, Venous: 2.2 mmol/L (ref 0.5–1.9)

## 2021-04-02 LAB — LIPASE, BLOOD: Lipase: 49 U/L (ref 11–51)

## 2021-04-02 LAB — PROCALCITONIN: Procalcitonin: <0.1 ng/mL

## 2021-04-02 MED ORDER — LACTATED RINGERS IV BOLUS
1000.0000 mL | Freq: Once | INTRAVENOUS | Status: AC
  Administered 2021-04-02: 1000 mL via INTRAVENOUS

## 2021-04-02 MED ORDER — ONDANSETRON HCL 4 MG/2ML IJ SOLN
4.0000 mg | INTRAMUSCULAR | Status: AC
  Administered 2021-04-02: 4 mg via INTRAVENOUS
  Filled 2021-04-02: qty 2

## 2021-04-02 MED ORDER — CEPHALEXIN 500 MG PO CAPS: 500.0000 mg | ORAL_CAPSULE | Freq: Three times a day (TID) | ORAL | 0 refills | Status: AC

## 2021-04-02 MED ORDER — KETOROLAC TROMETHAMINE 30 MG/ML IJ SOLN
15.0000 mg | Freq: Once | INTRAMUSCULAR | Status: AC
  Administered 2021-04-02: 15 mg via INTRAVENOUS
  Filled 2021-04-02: qty 1

## 2021-04-02 MED ORDER — IOHEXOL 350 MG/ML SOLN
75.0000 mL | Freq: Once | INTRAVENOUS | Status: AC | PRN
  Administered 2021-04-02: 75 mL via INTRAVENOUS

## 2021-04-02 MED ORDER — MORPHINE SULFATE (PF) 4 MG/ML IV SOLN
4.0000 mg | Freq: Once | INTRAVENOUS | Status: AC
  Administered 2021-04-02: 4 mg via INTRAVENOUS
  Filled 2021-04-02: qty 1

## 2021-04-02 NOTE — ED Notes (Signed)
Patient given discharge instructions, all questions answered. Patient in possession of all belongings, directed to the discharge area  

## 2021-04-02 NOTE — ED Provider Notes (Signed)
[facility] Emergency Department Provider Note  ____________________________________________   Event Date/Time   First MD Initiated Contact with Patient 04/02/21 [phone]     (approximate)  I have reviewed the triage vital signs and the nursing notes.   HISTORY  Chief Complaint Abdominal Pain    HPI Manuel Stafford. is a 75 y.o. male with medical history as listed below who presents for evaluation of cute onset pain in his left lower quadrant and left flank.  He said it seems to be radiating from the left lower quadrant to the left flank.  He has had similar pain for about a year but this is much more severe and was acute in onset and woke him from sleep.  Nothing in particular makes it better or worse.  He cannot find a position of comfort.  He denies nausea and vomiting.  He also reports that he has had no recent fever, sore throat, chest pain, shortness of breath.  He has been seen by Dr. Bary Castilla in the past for similar pain and was told he could have a hernia or diverticulitis (he has had diverticulitis in the past).  He has also had kidney stones and said this feels similar but worse.  He has had no pain nor dysuria nor hematuria.     Past Medical History:  Diagnosis Date   Back pain    lower   Diabetes mellitus without complication ([place])    Patient takes Metformin.   Elevated lipids    GERD (gastroesophageal reflux disease)    Hemorrhoids 2017   Hyperlipidemia    Hypertension    Kidney stone 2017   Parkinsonian features     Patient Active Problem List   Diagnosis Date Noted   Long term (current) use of non-steroidal anti-inflammatories (nsaid) 07/13/2019   Shoulder pain 03/21/2016   Hydronephrosis with urinary obstruction due to ureteral calculus 09/26/2015   Microscopic hematuria 09/26/2015   Hypertensive left ventricular hypertrophy 09/13/2015   Nephrolithiasis 08/29/2015   Right ureteral stone 08/29/2015   Abnormal ECG 08/22/2015    Benign essential HTN 08/22/2015   Combined fat and carbohydrate induced hyperlipemia 08/22/2015   Breathlessness on exertion 08/22/2015   Lung nodules 04/28/2015   Allergic rhinitis 12/31/2014   Diabetes ([place]) 12/31/2014   Erectile dysfunction 12/31/2014   Fatty liver disease, nonalcoholic 46/50/3546   Acid reflux 12/31/2014   BP (high blood pressure) 12/31/2014   HLD (hyperlipidemia) 12/31/2014   Lumbosacral spondylosis without myelopathy 12/31/2014   Adiposity 12/31/2014   Herpes zona 12/31/2014   Disorder of shoulder 12/31/2014   Apnea, sleep 12/31/2014   Avitaminosis D 12/31/2014   DDD (degenerative disc disease), lumbar 11/30/2013   Back ache 11/30/2013    Past Surgical History:  Procedure Laterality Date   CIRCUMCISION     COLONOSCOPY  2007   COLONOSCOPY WITH PROPOFOL N/A 08/22/2016   Procedure: COLONOSCOPY WITH PROPOFOL;  Surgeon: Christene Lye, MD;  Location: [facility] ENDOSCOPY;  Service: Endoscopy;  Laterality: N/A;   CYSTOSCOPY WITH STENT PLACEMENT Right 09/18/2015   Procedure: CYSTOSCOPY WITH STENT PLACEMENT;  Surgeon: Hollice Espy, MD;  Location: [facility] ORS;  Service: Urology;  Laterality: Right;   CYSTOSCOPY WITH URETEROSCOPY, STONE BASKETRY AND STENT PLACEMENT Right 10/03/2015   Procedure: CYSTOSCOPY WITH URETEROSCOPY, STONE BASKETRY AND STENT PLACEMENT;  Surgeon: Hollice Espy, MD;  Location: [facility] ORS;  Service: Urology;  Laterality: Right;   EXTRACORPOREAL SHOCK WAVE LITHOTRIPSY Right 09/15/2015   Procedure: EXTRACORPOREAL SHOCK WAVE LITHOTRIPSY (ESWL);  Surgeon: Nickie Retort, MD;  Location: [facility] ORS;  Service: Urology;  Laterality: Right;   HERNIA REPAIR     ROTATOR CUFF REPAIR Right 06/2012    Prior to Admission medications   Medication Sig Start Date End Date Taking? Authorizing Provider  aspirin 81 MG tablet Take 81 mg by mouth daily.  04/20/10   [provider]  cetirizine (ZYRTEC) 10 MG tablet TAKE 1 TABLET BY MOUTH EVERY DAY 03/08/21    [name], MD  Cholecalciferol (VITAMIN D3) 2000 UNITS capsule Take 2,000 Units by mouth daily.     [provider]  Continuous Blood Gluc Sensor (FREESTYLE LIBRE 2 SENSOR) MISC USE AS DIRECTED. CONTINUOUS BLOOD SENSOR. 03/13/21   [name], MD  diphenhydrAMINE (BENADRYL) 25 mg capsule Take by mouth.     [provider]  doxycycline (VIBRA-TABS) 100 MG tablet Take 1 tablet (100 mg total) by mouth 2 (two) times daily. Patient not taking: Reported on 02/16/2021 06/15/20   [name], MD  gabapentin (NEURONTIN) 300 MG capsule TAKE 1 CAPSULE BY MOUTH TWICE A DAY 10/17/20   [name], MD  hydrochlorothiazide (HYDRODIURIL) 25 MG tablet TAKE 1 TABLET BY MOUTH EVERY DAY 11/25/20   [name], MD  ketoconazole (NIZORAL) 2 % cream Apply to feet and between toes QHS. 03/07/20   Ralene Bathe, MD  meclizine (ANTIVERT) 25 MG tablet Take 2 tablets (50 mg total) by mouth 3 (three) times daily as needed for dizziness. 12/16/17   [name], MD  metFORMIN (GLUCOPHAGE) 1000 MG tablet TAKE 1 TABLET (1,000 MG TOTAL) BY MOUTH 2 (TWO) TIMES DAILY WITH A MEAL. 03/13/21   [name], MD  MULTIPLE VITAMIN PO Take by mouth daily.  04/20/10   [provider]  omeprazole (PRILOSEC) 20 MG capsule TAKE 1 CAPSULE BY MOUTH EVERY DAY 03/08/21   [name], MD  [facility] ULTRA test strip USE DAILY AS DIRECTED 06/06/20   [name], MD  pioglitazone (ACTOS) 15 MG tablet TAKE 1 TABLET BY MOUTH EVERY DAY 03/08/21   [name], MD  rOPINIRole (REQUIP) 0.25 MG tablet Take by mouth daily.  01/04/20   [provider]  rosuvastatin (CRESTOR) 20 MG tablet TAKE 1 TABLET BY MOUTH EVERY DAY 03/08/21   [name], MD  Saw Palmetto, Serenoa repens, (SAW PALMETTO BERRY) 160 MG CAPS Take 2 tablets by mouth daily.  04/20/10   [provider]  Semaglutide,0.25 or 0.5MG /DOS,  (OZEMPIC, 0.25 OR 0.5 MG/DOSE,) 2 MG/1.5ML SOPN Inject 0.5 mg into the skin once a week. 05/11/20   [name], MD  tadalafil (CIALIS) 20 MG tablet Take 0.5-1 tablets (10-20 mg total) by mouth every other day as needed for erectile dysfunction. 02/09/20   [name], MD  telmisartan (MICARDIS) 40 MG tablet TAKE 1 TABLET BY MOUTH EVERY DAY 03/13/21   [name], MD  tiZANidine (ZANAFLEX) 4 MG tablet TAKE 1 TABLET BY MOUTH AT BEDTIME. 01/11/21   [name], MD  traMADol (ULTRAM) 50 MG tablet TAKE 1 TABLET BY MOUTH 3 TIMES A DAY AS NEEDED FOR BACK OR LEG PAIN 03/13/21   [name], MD  valACYclovir (VALTREX) 1000 MG tablet Take 1 tablet (1,000 mg total) by mouth 3 (three) times daily. 06/15/20   [name], MD    Allergies Patient has  no known allergies.  Family History  Problem Relation Age of Onset   Hypertension Mother    Diabetes Father    CAD Father    Diabetes Sister    Heart disease Sister    CAD Sister     Social History Social History   Tobacco Use   Smoking status: Former    Packs/day: 2.00    Years: 15.00    Pack years: 30.00    Types: Cigarettes    Quit date: 06/25/1974    Years since quitting: 46.8   Smokeless tobacco: Never  Vaping Use   Vaping Use: Never used  Substance Use Topics   Alcohol use: No   Drug use: No    Review of Systems Constitutional: No fever/chills Eyes: No visual changes. ENT: No sore throat. Cardiovascular: Denies chest pain. Respiratory: Denies shortness of breath. Gastrointestinal: Cute onset and severe left-sided abdominal pain radiating into the flank.  Denies nausea and vomiting. Genitourinary: Negative for dysuria. Musculoskeletal: Negative for neck pain.  Negative for back pain. Integumentary: Negative for rash. Neurological: Negative for headaches, focal weakness or numbness.   ____________________________________________   PHYSICAL EXAM:  ED Triage Vitals   Enc Vitals Group     BP 04/02/21 0457 (!) 155/77     Pulse Rate 04/02/21 0457 67     Resp 04/02/21 0457 15     Temp 04/02/21 0457 97.7 F (36.5 C)     Temp Source 04/02/21 0457 Oral     SpO2 04/02/21 0457 100 %     Weight 04/02/21 0456 106.6 kg (235 lb 0.2 oz)     Height 04/02/21 0456 1.854 m (6\' 1" )     Head Circumference --      Peak Flow --      Pain Score --      Pain Loc --      Pain Edu? --      Excl. in [place]? --      Constitutional: Alert and oriented.  Appears uncomfortable. Eyes: Conjunctivae are normal.  Head: Atraumatic. Nose: No congestion/rhinnorhea. Mouth/Throat: Patient is wearing a mask. Neck: No stridor.  No meningeal signs.   Cardiovascular: Normal rate, regular rhythm. Good peripheral circulation. Respiratory: Normal respiratory effort.  No retractions. Gastrointestinal: Soft and nondistended.  Tenderness to palpation of the left lower quadrant.  Also tender in the left flank. Musculoskeletal: No lower extremity tenderness nor edema. No gross deformities of extremities. Neurologic:  Normal speech and language. No gross focal neurologic deficits are appreciated.  Skin:  Skin is warm, dry and intact. Psychiatric: Mood and affect are normal. Speech and behavior are normal.  ____________________________________________   LABS (all labs ordered are listed, but only abnormal results are displayed)  Labs Reviewed  COMPREHENSIVE METABOLIC PANEL - Abnormal; Notable for the following components:      Result Value   Glucose, Bld 156 (*)    BUN 24 (*)    Creatinine, Ser 1.57 (*)    GFR, Estimated 45 (*)    All other components within normal limits  LACTIC ACID, PLASMA - Abnormal; Notable for the following components:   Lactic Acid, Venous 2.5 (*)    All other components within normal limits  CBC WITH DIFFERENTIAL/PLATELET  LIPASE, BLOOD  PROCALCITONIN  URINALYSIS, ROUTINE W REFLEX MICROSCOPIC  LACTIC ACID, PLASMA    ____________________________________________  EKG  ED ECG REPORT I, Hinda Kehr, the attending physician, personally viewed and interpreted this ECG.  Date: 04/02/2021 EKG Time: 4:56 AM Rate: 67 Rhythm:  Sinus rhythm with first-degree AV block QRS Axis: normal Intervals: Prolonged QTC at 516 ms, PR interval is 254 ms, right bundle branch block with left posterior fascicular block ST/T Wave abnormalities: Non-specific ST segment / T-wave changes, but no clear evidence of acute ischemia. Narrative Interpretation: no definitive evidence of acute ischemia; does not meet STEMI criteria.  ____________________________________________  RADIOLOGY I, Hinda Kehr, personally viewed and evaluated these images (plain radiographs) as part of my medical decision making, as well as reviewing the written report by the radiologist.  ED MD interpretation: Recently passed ureteral stone on the left nonobstructive calculus in the lower pole of the collecting system of the left kidney.  No evidence of diverticulitis.  Official radiology report(s): CT ABDOMEN PELVIS W CONTRAST  Result Date: 04/02/2021 CLINICAL DATA:  75 year old male with history of flank pain. Chronic abdominal pain for 1 year, worsening over the past hour. EXAM: CT ABDOMEN AND PELVIS WITH CONTRAST TECHNIQUE: Multidetector CT imaging of the abdomen and pelvis was performed using the standard protocol following bolus administration of intravenous contrast. CONTRAST:  78mL OMNIPAQUE IOHEXOL 350 MG/ML SOLN COMPARISON:  CT the abdomen and pelvis 10/11/2017. FINDINGS: Lower chest: 5 mm right middle lobe pulmonary nodule (axial image 2 of series 6), stable dating back to 08/23/2015, considered definitively benign. Atherosclerotic calcifications in the descending thoracic aorta as well as the left anterior descending and right coronary arteries. Severe calcifications of the aortic valve. Hepatobiliary: No suspicious cystic or solid hepatic  lesions. No intra or extrahepatic biliary ductal dilatation. Gallbladder is normal in appearance. Pancreas: No pancreatic mass. No pancreatic ductal dilatation. No pancreatic or peripancreatic fluid collections or inflammatory changes. Spleen: Unremarkable. Adrenals/Urinary Tract: 3 mm nonobstructive calculus in the lower pole collecting system of the left kidney. 2 mm calculus at or immediately beyond the left ureterovesicular junction (axial image 87 of series 2) along the left posterolateral wall of the urinary bladder. Small amount of left perinephric soft tissue stranding. Fullness in the left renal collecting system. Bilateral kidneys and adrenal glands are otherwise normal in appearance. Urinary bladder is otherwise normal in appearance. Stomach/Bowel: The appearance of the stomach is normal. There is no pathologic dilatation of small bowel or colon. Numerous colonic diverticulae are noted, particularly in the sigmoid colon, without surrounding inflammatory changes to suggest an acute diverticulitis at this time. Normal appendix. Vascular/Lymphatic: Aortic atherosclerosis, without evidence of aneurysm or dissection in the abdominal or pelvic vasculature. No lymphadenopathy noted in the abdomen or pelvis. Reproductive: Prostate gland and seminal vesicles are unremarkable in appearance. Other: No significant volume of ascites.  No pneumoperitoneum. Musculoskeletal: There are no aggressive appearing lytic or blastic lesions noted in the visualized portions of the skeleton. IMPRESSION: 1. 2 mm calculus along the posterolateral wall of the urinary bladder at or immediately beyond the left ureterovesicular junction. Very mild fullness of the left renal collecting system and left perinephric soft tissue stranding. This likely represents a recently passed calculus. 2. 3 mm nonobstructive calculus also noted in the lower pole collecting system of left kidney. 3. Colonic diverticulosis without evidence of acute  diverticulitis at this time. 4. Aortic atherosclerosis, in addition to least 2 vessel coronary artery disease. Assessment for potential risk factor modification, dietary therapy or pharmacologic therapy may be warranted, if clinically indicated. 5. There are calcifications of the aortic valve. Echocardiographic correlation for evaluation of potential valvular dysfunction may be warranted if clinically indicated. Electronically Signed   By: Vinnie Langton M.D.   On: 04/02/2021 07:07  ____________________________________________   PROCEDURES   Procedure(s) performed (including Critical Care):  Procedures   ____________________________________________   INITIAL IMPRESSION / MDM / Scalp Level / ED COURSE  As part of my medical decision making, I reviewed the following data within the [place] notes reviewed and incorporated, Labs reviewed , EKG interpreted , Old chart reviewed, Patient signed out to Dr. Cinda Quest, reviewed Notes from prior ED visits, and [place] Controlled Substance Database   Differential diagnosis includes, but is not limited to, ureteral colic, diverticulitis, less likely hernia.  The patient is on the cardiac monitor to evaluate for evidence of arrhythmia and/or significant heart rate changes.    Symptoms are most consistent with kidney stone/ureteral colic.  I am providing Toradol 50 mg IV, morphine 4 mg IV, Zofran 4 mg IV.  Lab work is pending.  No signs or symptoms to suggest infection at this point unless this is a diverticulitis but the acute onset of the pain during the night suggest renal colic.  Patient understands and agrees with the plan.  I will scan him after we verify his kidney function.  Clinical Course as of 04/02/21 0749  Nancy Fetter Apr 02, 2021  6389 Lactic Acid, Venous(!!): 2.5 Although the patient's lactic acid is 2.5, I doubt sepsis.  He has no tachycardia, no leukocytosis, no obvious source of infection, no fever.  I am  giving LR 2 L bolus and will recheck.  Comprehensive metabolic panel is notable for mild AKI with a creatinine of 1.57.  We will rehydrate; otherwise his evaluation is reassuring.  CBC is normal with no anemia and no leukocytosis.  Lipase is normal.  I am adding on a procalcitonin and checking a CT of the abdomen and pelvis with IV contrast to rule out diverticulitis or other intra-abdominal infection as well as to evaluate for possible ureteral colic. [CF]  0636 Procalcitonin: <0.10 Reassuringly normal procalcitonin [CF]  0746 Patient appears to have a recently passed kidney stone on the left.  He is feeling better at this time.  I will prepare him for discharge but I am transferring ED care to Dr. Cinda Quest who will follow up on his urinalysis and on the results of his second lactic acid which will be drawn after he completes his second fluid bolus.  The patient is feeling well with some residual pain but nothing like before.  He understands the plan for discharge if his lactic acid has improved and he continues to feel well.  No indication for antibiotics unless his urinalysis is positive. [CF]    Clinical Course User Index [CF] Hinda Kehr, MD     ____________________________________________  FINAL CLINICAL IMPRESSION(S) / ED DIAGNOSES  Final diagnoses:  Kidney stone     MEDICATIONS GIVEN DURING THIS VISIT:  Medications  morphine 4 MG/ML injection 4 mg (4 mg Intravenous Given 04/02/21 0522)  ketorolac (TORADOL) 30 MG/ML injection 15 mg (15 mg Intravenous Given 04/02/21 0521)  ondansetron (ZOFRAN) injection 4 mg (4 mg Intravenous Given 04/02/21 0522)  lactated ringers bolus 1,000 mL (1,000 mLs Intravenous New Bag/Given 04/02/21 0715)  lactated ringers bolus 1,000 mL (0 mLs Intravenous Stopped 04/02/21 0716)  iohexol (OMNIPAQUE) 350 MG/ML injection 75 mL (75 mLs Intravenous Contrast Given 04/02/21 0650)     ED Discharge Orders     None        Note:  This document was prepared  using Dragon voice recognition software and may include unintentional dictation errors.   Hinda Kehr, MD  04/02/21 0749  

## 2021-04-02 NOTE — ED Notes (Signed)
Dr. Cinda Quest EDP at bedside.

## 2021-04-02 NOTE — ED Notes (Addendum)
In to introduce self to patient, patient resting comfortably with family at bedside. Denies any pain at this time, denies any needs. IV fluids infusing.

## 2021-04-02 NOTE — ED Triage Notes (Signed)
Patient BIB EMS for complaint of abdominal pain x1 year, worse for past hour. Denies N/V/D, fevers, urinary symptoms. CBG WNL. Hx of HTN and kidney stones. Patient is AxOx4.

## 2021-04-02 NOTE — ED Notes (Signed)
CRITICAL VALUE STICKER  CRITICAL VALUE: Lactate 2.5   MD NOTIFIED: Karma Greaser  TIME OF NOTIFICATION: 5:45 AM

## 2021-04-02 NOTE — ED Notes (Signed)
Pt ambulatory to the restroom at this time.  

## 2021-04-02 NOTE — ED Provider Notes (Addendum)
-----------------------------------------   11:27 AM on 04/02/2021 ----------------------------------------- Previous note I dictated on this gentleman has disappeared.  What I had said was the patient's lactic acid had gone up although the patient was feeling perfectly well and had no abdominal pain.  His other labs have been essentially fine except for the hematuria and a small amount of white blood cells in the urine as well.  CT showed the stone had passed into his bladder.  I discussed with the patient possible courses of action and we decided we would give him some more fluid and repeat blood work.  This was done his lactic acid is now gone down to 2.2 his CBC is essentially normal.  He still feels fine.  I will let him go he will return if he has any further problems.  I will have him follow-up with urology and give him some antibiotics just in case they do small amount of white blood cells represent a UTI.  I will have the patient strain all his urine.  I will give him Keflex 500 mg 3 times a day for 10 days.    Nena Polio, MD 04/02/21 1128 Patient does not want any pain medication at this point.  His stone is passed he should be fine.  I do not see where the Norco is actually been prescribed for him.   Nena Polio, MD 04/02/21 1131

## 2021-04-02 NOTE — ED Notes (Signed)
Family at bedside to nurses station stating that pt's personal glucose monitor is reading 64. Pt given OJ and graham crackers at this time with Dr. Cinda Quest approval.

## 2021-04-02 NOTE — Discharge Instructions (Addendum)
As we discussed, it appears that you had a kidney stone on the left that you passed.  However it is very likely you will have issues with kidney stones again in the future.  You will likely continue to be sore for some time.  We recommend you take over-the-counter ibuprofen and/or Tylenol according to label instructions. Take Norco as prescribed for severe pain. Do not drink alcohol, drive or participate in any other potentially dangerous activities while taking this medication as it may make you sleepy. Do not take this medication with any other sedating medications, either prescription or over-the-counter. If you were prescribed Percocet or Vicodin, do not take these with acetaminophen (Tylenol) as it is already contained within these medications.   This medication is an opiate (or narcotic) pain medication and can be habit forming.  Use it as little as possible to achieve adequate pain control.  Do not use or use it with extreme caution if you have a history of opiate abuse or dependence.  If you are on a pain contract with your primary care doctor or a pain specialist, be sure to let them know you were prescribed this medication today from the [facility] Emergency Department.  This medication is intended for your use only - do not give any to anyone else and keep it in a secure place where nobody else, especially children, have access to it.  It will also cause or worsen constipation, so you may want to consider taking an over-the-counter stool softener while you are taking this medication.  Follow-up with your regular doctor or return to the emergency department with new or worsening symptoms that concern you.  Take the Keflex 1 pill 3 times to treat a possible UTI.  Be sure you return here if you feel sicker develop a fever nausea vomiting etc.

## 2021-04-12 ENCOUNTER — Other Ambulatory Visit: Payer: Self-pay

## 2021-04-12 ENCOUNTER — Ambulatory Visit (INDEPENDENT_AMBULATORY_CARE_PROVIDER_SITE_OTHER): Payer: HMO

## 2021-04-12 ENCOUNTER — Ambulatory Visit: Payer: HMO | Admitting: Family Medicine

## 2021-04-12 DIAGNOSIS — Z23 Encounter for immunization: Secondary | ICD-10-CM | POA: Diagnosis not present

## 2021-04-18 ENCOUNTER — Ambulatory Visit: Payer: HMO | Admitting: Urology

## 2021-04-18 ENCOUNTER — Other Ambulatory Visit: Payer: Self-pay

## 2021-04-18 ENCOUNTER — Encounter: Payer: Self-pay | Admitting: Urology

## 2021-04-18 VITALS — BP 115/74 | HR 103 | Ht 73.0 in | Wt 228.0 lb

## 2021-04-18 DIAGNOSIS — N200 Calculus of kidney: Secondary | ICD-10-CM | POA: Diagnosis not present

## 2021-04-18 LAB — URINALYSIS, COMPLETE
Bilirubin, UA: NEGATIVE
Glucose, UA: NEGATIVE
Leukocytes,UA: NEGATIVE
Nitrite, UA: NEGATIVE
Protein,UA: NEGATIVE
RBC, UA: NEGATIVE
Specific Gravity, UA: >1.03 — ABNORMAL HIGH (ref 1.005–1.030)
Urobilinogen, Ur: 0.2 mg/dL (ref 0.2–1.0)
pH, UA: 5.5 (ref 5.0–7.5)

## 2021-04-18 LAB — MICROSCOPIC EXAMINATION
Bacteria, UA: NONE SEEN
RBC, Urine: NONE SEEN /hpf (ref 0–2)

## 2021-04-18 MED ORDER — SILDENAFIL CITRATE 20 MG PO TABS: ORAL_TABLET | ORAL | 1 refills | Status: DC

## 2021-04-18 NOTE — Patient Instructions (Signed)
TRIMIX SELF-INJECTION INSTRUCTIONS    DETAILED PROCEDURE  1. GETTING SET UP  A. Proper hygiene is important. Wash your hands and keep the penis clean.  B. Assemble the following:  - Bottle of Trimix  - Alcohol pad  - Syringe  C. Keep the Trimix cold by returning the bottle to the refrigerator, or by placing the bottle in a cup of ice.   2. PREPARE THE SYRINGE  A. Wipe the rubber top of the vial with an alcohol pad.  B. After removing the cap of the needle, pull the plunger back to the desired dosage, filling this volume with air. Use a new needle and syringe each time.  C. Insert the needle through the rubber top and inject the air into the vial.  D. Turn the vial with needle and syringe inserted upside down. Pull back on the syringe plunger in a slow and steady motion until the desired dosage is achieved.  E. Tap the side of the syringe (1cc tuberculin syringe with a 29 gauge needle) to allow any air bubbles to float towards the needle. Avoid having these air bubbles in the syringe when self-injecting by first injecting out the collected bubbles that may form.  F. Remove the needle from the bottle and replace the protective cap on the needle.    3. SELECT AND PREPARE THE SITE FOR INJECTION  A. The proper location for injection is at the 9-11 and 1-3 o'clock positions, between the base and mid-portion of the penis.(see diagram) Avoid the midline because of potential for injury to the urethra (6 o'clock; for urinary passage) and the penile arteries and nerves (near 12 o'clock). Avoid any visible veins or arteries on the surface.  B. Grasp and pull the head of the penis toward the side of your leg with the index finger and thumb (use the left hand, if right handed). While maintaining light tension, select a site for injection.  C. Clean the site with an alcohol pad.   4: INJECT TRIMIX AND APPLY COMPRESSION  A. With a steady and continuous motion, penetrate the skin with the needle at a 90 o  angle. The needle should then be advanced to the hub. Slight resistance is encountered as the needle passes into the proper position within the erectile tissue (corporeal body).  B. Inject the Trimix over approximately 4 seconds. Withdraw the needle from the penis and apply compression to the injection site for approximately 1 minute. Several minutes of compression may be required to avoid bleeding, especially if you are an aspirin user.  C. Replace the cap on the needle and dispose of properly.   For additional info please call: [phone] Or email: [email or web address]

## 2021-04-18 NOTE — Progress Notes (Signed)
04/18/21 10:16 AM   Manuel R Maryan Char. 10/19/1945 [id number]  Referring provider:  [name], MD [address] [place],  [place] [address] Chief Complaint  Patient presents with   Nephrolithiasis     HPI: Manuel Stafford. is a 75 y.o.male who presents today for further evaluation and care of kidney stones.   He has a personal history of nephrolithiasis. He was last seen In clinic for this in 2017. He is s/p uncomplicated right ureteroscopy . Stone analysis during this time  showed 65% calcium oxalate, 5% calcium phosphate, 30% uric acid.   He was seen in the ED on 04/02/2021 for onset acute left lower quadrant pain and flank pain. He reported that he has had similar pain for about a year. also reports that he has had no recent fever, sore throat, chest pain, shortness of breath.  He has been seen by Dr. Bary Castilla in the past for similar pain and was told he could have a hernia or diverticulitis (he has had diverticulitis in the past).  He has also had kidney stones and said this feels similar but worse. Urinalysis revealed large Hgb urine dipstick, >50 WBCs, 11-20 WBCs. He was given Keflex in case of an infection. His lactic acid during the time of the visit was elevated at highest level of 2.5 but went down to 2.2.    04/02/2021 CT of abdomen and pelvis revealed 2 mm calculus along the posterolateral wall of the urinary bladder at or immediately beyond the left ureterovesicular junction. Very mild fullness of the left renal collecting system and left perinephric soft tissue stranding. This likely represents a recently passed calculus. Also it revealed 3 mm nonobstructive calculus also noted in the lower pole collecting system of left kidney.  He is doing well today. He is most concerned if he has any stones left. He reports no pain.   He reports that he has started new relationships since his wife passed 6 years ago. He is interested in care for his  erectile dysfunction. He has taken 20 mg of tadalafil previously that does help some with penile fullness but only about 75% and not adequate for penetration.   PMH: Past Medical History:  Diagnosis Date   Back pain    lower   Diabetes mellitus without complication ([place])    Patient takes Metformin.   Elevated lipids    GERD (gastroesophageal reflux disease)    Hemorrhoids 2017   Hyperlipidemia    Hypertension    Kidney stone 2017   Parkinsonian features     Surgical History: Past Surgical History:  Procedure Laterality Date   CIRCUMCISION     COLONOSCOPY  2007   COLONOSCOPY WITH PROPOFOL N/A 08/22/2016   Procedure: COLONOSCOPY WITH PROPOFOL;  Surgeon: Christene Lye, MD;  Location: [facility];  Service: Endoscopy;  Laterality: N/A;   CYSTOSCOPY WITH STENT PLACEMENT Right 09/18/2015   Procedure: CYSTOSCOPY WITH STENT PLACEMENT;  Surgeon: Hollice Espy, MD;  Location: [facility];  Service: Urology;  Laterality: Right;   CYSTOSCOPY WITH URETEROSCOPY, STONE BASKETRY AND STENT PLACEMENT Right 10/03/2015   Procedure: CYSTOSCOPY WITH URETEROSCOPY, STONE BASKETRY AND STENT PLACEMENT;  Surgeon: Hollice Espy, MD;  Location: [facility];  Service: Urology;  Laterality: Right;   EXTRACORPOREAL SHOCK WAVE LITHOTRIPSY Right 09/15/2015   Procedure: EXTRACORPOREAL SHOCK WAVE LITHOTRIPSY (ESWL);  Surgeon: Nickie Retort, MD;  Location: [facility];  Service: Urology;  Laterality: Right;   HERNIA REPAIR  ROTATOR CUFF REPAIR Right 06/2012    Home Medications:  Allergies as of 04/18/2021   No Known Allergies      Medication List        Accurate as of April 18, 2021 10:16 AM. If you have any questions, ask your nurse or doctor.          aspirin 81 MG tablet Take 81 mg by mouth daily.   cetirizine 10 MG tablet Commonly known as: ZYRTEC TAKE 1 TABLET BY MOUTH EVERY DAY   diphenhydrAMINE 25 mg capsule Commonly known as: BENADRYL Take by mouth.   doxycycline 100 MG  tablet Commonly known as: VIBRA-TABS Take 1 tablet (100 mg total) by mouth 2 (two) times daily.   FreeStyle Libre 2 Sensor Misc USE AS DIRECTED. CONTINUOUS BLOOD SENSOR.   gabapentin 300 MG capsule Commonly known as: NEURONTIN TAKE 1 CAPSULE BY MOUTH TWICE A DAY   hydrochlorothiazide 25 MG tablet Commonly known as: HYDRODIURIL TAKE 1 TABLET BY MOUTH EVERY DAY   ketoconazole 2 % cream Commonly known as: NIZORAL Apply to feet and between toes QHS.   meclizine 25 MG tablet Commonly known as: ANTIVERT Take 2 tablets (50 mg total) by mouth 3 (three) times daily as needed for dizziness.   metFORMIN 1000 MG tablet Commonly known as: GLUCOPHAGE TAKE 1 TABLET (1,000 MG TOTAL) BY MOUTH 2 (TWO) TIMES DAILY WITH A MEAL.   MULTIPLE VITAMIN PO Take by mouth daily.   omeprazole 20 MG capsule Commonly known as: PRILOSEC TAKE 1 CAPSULE BY MOUTH EVERY DAY   OneTouch Ultra test strip Generic drug: glucose blood USE DAILY AS DIRECTED   Ozempic (0.25 or 0.5 MG/DOSE) 2 MG/1.5ML Sopn Generic drug: Semaglutide(0.25 or 0.5MG /DOS) Inject 0.5 mg into the skin once a week.   pioglitazone 15 MG tablet Commonly known as: ACTOS TAKE 1 TABLET BY MOUTH EVERY DAY   rOPINIRole 0.25 MG tablet Commonly known as: REQUIP Take by mouth daily.   rosuvastatin 20 MG tablet Commonly known as: CRESTOR TAKE 1 TABLET BY MOUTH EVERY DAY   Saw Palmetto Berry 160 MG Caps Take 2 tablets by mouth daily.   tadalafil 20 MG tablet Commonly known as: CIALIS Take 0.5-1 tablets (10-20 mg total) by mouth every other day as needed for erectile dysfunction.   telmisartan 40 MG tablet Commonly known as: MICARDIS TAKE 1 TABLET BY MOUTH EVERY DAY   tiZANidine 4 MG tablet Commonly known as: ZANAFLEX TAKE 1 TABLET BY MOUTH AT BEDTIME.   traMADol 50 MG tablet Commonly known as: ULTRAM TAKE 1 TABLET BY MOUTH 3 TIMES A DAY AS NEEDED FOR BACK OR LEG PAIN   valACYclovir 1000 MG tablet Commonly known as:  VALTREX Take 1 tablet (1,000 mg total) by mouth 3 (three) times daily.   Vitamin D3 50 MCG (2000 UT) capsule Take 2,000 Units by mouth daily.        Allergies: No Known Allergies  Family History: Family History  Problem Relation Age of Onset   Hypertension Mother    Diabetes Father    CAD Father    Diabetes Sister    Heart disease Sister    CAD Sister     Social History:  reports that he quit smoking about 46 years ago. His smoking use included cigarettes. He has a 30.00 pack-year smoking history. He has never used smokeless tobacco. He reports that he does not drink alcohol and does not use drugs.   Physical Exam: BP 115/74   Pulse (!) 103  Ht 6\' 1"  (1.854 m)   Wt 228 lb (103.4 kg)   BMI 30.08 kg/m   Constitutional:  Alert and oriented, No acute distress. HEENT: [place] AT, moist mucus membranes.  Trachea midline, no masses. Cardiovascular: No clubbing, cyanosis, or edema. Respiratory: Normal respiratory effort, no increased work of breathing. Skin: No rashes, bruises or suspicious lesions. Neurologic: Grossly intact, no focal deficits, moving all 4 extremities. Psychiatric: Normal mood and affect.  Laboratory Data:  Lab Results  Component Value Date   CREATININE 1.57 (H) 04/02/2021     Lab Results  Component Value Date   HGBA1C 6.6 (A) 02/16/2021    Pertinent Imaging: CLINICAL DATA:  75 year old male with history of flank pain. Chronic abdominal pain for 1 year, worsening over the past hour.   EXAM: CT ABDOMEN AND PELVIS WITH CONTRAST   TECHNIQUE: Multidetector CT imaging of the abdomen and pelvis was performed using the standard protocol following bolus administration of intravenous contrast.   CONTRAST:  46mL OMNIPAQUE IOHEXOL 350 MG/ML SOLN   COMPARISON:  CT the abdomen and pelvis 10/11/2017.   FINDINGS: Lower chest: 5 mm right middle lobe pulmonary nodule (axial image 2 of series 6), stable dating back to 08/23/2015, considered definitively  benign. Atherosclerotic calcifications in the descending thoracic aorta as well as the left anterior descending and right coronary arteries. Severe calcifications of the aortic valve.   Hepatobiliary: No suspicious cystic or solid hepatic lesions. No intra or extrahepatic biliary ductal dilatation. Gallbladder is normal in appearance.   Pancreas: No pancreatic mass. No pancreatic ductal dilatation. No pancreatic or peripancreatic fluid collections or inflammatory changes.   Spleen: Unremarkable.   Adrenals/Urinary Tract: 3 mm nonobstructive calculus in the lower pole collecting system of the left kidney. 2 mm calculus at or immediately beyond the left ureterovesicular junction (axial image 87 of series 2) along the left posterolateral wall of the urinary bladder. Small amount of left perinephric soft tissue stranding. Fullness in the left renal collecting system. Bilateral kidneys and adrenal glands are otherwise normal in appearance. Urinary bladder is otherwise normal in appearance.   Stomach/Bowel: The appearance of the stomach is normal. There is no pathologic dilatation of small bowel or colon. Numerous colonic diverticulae are noted, particularly in the sigmoid colon, without surrounding inflammatory changes to suggest an acute diverticulitis at this time. Normal appendix.   Vascular/Lymphatic: Aortic atherosclerosis, without evidence of aneurysm or dissection in the abdominal or pelvic vasculature. No lymphadenopathy noted in the abdomen or pelvis.   Reproductive: Prostate gland and seminal vesicles are unremarkable in appearance.   Other: No significant volume of ascites.  No pneumoperitoneum.   Musculoskeletal: There are no aggressive appearing lytic or blastic lesions noted in the visualized portions of the skeleton.   IMPRESSION: 1. 2 mm calculus along the posterolateral wall of the urinary bladder at or immediately beyond the left ureterovesicular  junction. Very mild fullness of the left renal collecting system and left perinephric soft tissue stranding. This likely represents a recently passed calculus. 2. 3 mm nonobstructive calculus also noted in the lower pole collecting system of left kidney. 3. Colonic diverticulosis without evidence of acute diverticulitis at this time. 4. Aortic atherosclerosis, in addition to least 2 vessel coronary artery disease. Assessment for potential risk factor modification, dietary therapy or pharmacologic therapy may be warranted, if clinically indicated. 5. There are calcifications of the aortic valve. Echocardiographic correlation for evaluation of potential valvular dysfunction may be warranted if clinically indicated.     Electronically Signed  By: Vinnie Langton M.D.   On: 04/02/2021 07:07   I have personally reviewed the images and agree with radiologist interpretation.   Assessment & Plan:   kidney stones  -Status post interval passage of obstructing stone with punctate nonobstructing left stone - Would not recommend any intervention for this small stone but we can continue to monitor - KUB; 1 year  - We discussed general stone prevention techniques including drinking plenty water with goal of producing 2.5 L urine daily, increased citric acid intake, avoidance of high oxalate containing foods, and decreased salt intake.  Information about dietary recommendations given today.    2. Erectile dysfunction  - We discussed the pathophysiology of erectile dysfunction today along with possible contributing factors. Discussed possible treatment options including PDE 5 inhibitors, vacuum erectile device, intracavernosal injection, MUSE, and placement of the inflatable or malleable penile prosthesis for refractory cases.  In terms of PDE 5 inhibitors, we discussed contraindications for this medication as well as common side effects. Patient was counseled on optimal use. All of his  questions were answered in detail.  Also briefly discussed shockwave therapy, offered referral to [facility] but ultimately declined.  We did also discuss injection therapy extensively and he may want to pursue this.  We discussed our office policy on test dose titration and teaching.  He will let us know if he wants to pursue this.  - Sildenafil prescribed     Follow-up in 1 year with KUB  I,Kailey Littlejohn,acting as a scribe for Hollice Espy, MD.,have documented all relevant documentation on the behalf of Hollice Espy, MD,as directed by  Hollice Espy, MD while in the presence of Hollice Espy, MD.  I have reviewed the above documentation for accuracy and completeness, and I agree with the above.   Hollice Espy, MD   [facility] [address], [place], [place] [address] [phone]

## 2021-04-19 ENCOUNTER — Ambulatory Visit (INDEPENDENT_AMBULATORY_CARE_PROVIDER_SITE_OTHER): Payer: HMO | Admitting: Family Medicine

## 2021-04-19 VITALS — BP 129/67 | HR 86 | Temp 97.8°F | Wt 228.0 lb

## 2021-04-19 DIAGNOSIS — I359 Nonrheumatic aortic valve disorder, unspecified: Secondary | ICD-10-CM

## 2021-04-19 DIAGNOSIS — K760 Fatty (change of) liver, not elsewhere classified: Secondary | ICD-10-CM

## 2021-04-19 DIAGNOSIS — N200 Calculus of kidney: Secondary | ICD-10-CM | POA: Diagnosis not present

## 2021-04-19 DIAGNOSIS — I119 Hypertensive heart disease without heart failure: Secondary | ICD-10-CM | POA: Diagnosis not present

## 2021-04-19 DIAGNOSIS — M25511 Pain in right shoulder: Secondary | ICD-10-CM | POA: Diagnosis not present

## 2021-04-19 DIAGNOSIS — E1169 Type 2 diabetes mellitus with other specified complication: Secondary | ICD-10-CM

## 2021-04-19 DIAGNOSIS — J309 Allergic rhinitis, unspecified: Secondary | ICD-10-CM | POA: Diagnosis not present

## 2021-04-19 DIAGNOSIS — M259 Joint disorder, unspecified: Secondary | ICD-10-CM | POA: Diagnosis not present

## 2021-04-19 MED ORDER — FREESTYLE LIBRE 3 SENSOR MISC: 1.0000 | 3 refills | Status: DC

## 2021-04-19 NOTE — Progress Notes (Signed)
Acute Office Visit  Subjective:    Patient ID: Manuel Stafford., male    DOB: 10-26-1945, 75 y.o.   MRN: [id number]  No chief complaint on file.   HPI Patient is in today for discussion of CT results.  He is concerned about the mention of severe calcification of the aortic valve.  He also had complaints of right shoulder pain.  He has history of surgery on the shoulder.  He questions if something has happened to the shoulder as a result of the physical therapy or possibly bad surgery.  He relates that at times it feels as though an electrical current is going through the shoulder.   He is actually doing surprisingly well since the fairly sudden death of his wife from metastatic cancer.  They were married for almost 60 years.  He has lost about 12 pounds but is pleased with his weight loss.  Mostly he is feeling well Past Medical History:  Diagnosis Date   Back pain    lower   Diabetes mellitus without complication ([place])    Patient takes Metformin.   Elevated lipids    GERD (gastroesophageal reflux disease)    Hemorrhoids 2017   Hyperlipidemia    Hypertension    Kidney stone 2017   Parkinsonian features     Past Surgical History:  Procedure Laterality Date   CIRCUMCISION     COLONOSCOPY  2007   COLONOSCOPY WITH PROPOFOL N/A 08/22/2016   Procedure: COLONOSCOPY WITH PROPOFOL;  Surgeon: Christene Lye, MD;  Location: [facility];  Service: Endoscopy;  Laterality: N/A;   CYSTOSCOPY WITH STENT PLACEMENT Right 09/18/2015   Procedure: CYSTOSCOPY WITH STENT PLACEMENT;  Surgeon: Hollice Espy, MD;  Location: [facility];  Service: Urology;  Laterality: Right;   CYSTOSCOPY WITH URETEROSCOPY, STONE BASKETRY AND STENT PLACEMENT Right 10/03/2015   Procedure: CYSTOSCOPY WITH URETEROSCOPY, STONE BASKETRY AND STENT PLACEMENT;  Surgeon: Hollice Espy, MD;  Location: [facility];  Service: Urology;  Laterality: Right;   EXTRACORPOREAL SHOCK WAVE LITHOTRIPSY Right 09/15/2015    Procedure: EXTRACORPOREAL SHOCK WAVE LITHOTRIPSY (ESWL);  Surgeon: Nickie Retort, MD;  Location: [facility];  Service: Urology;  Laterality: Right;   HERNIA REPAIR     ROTATOR CUFF REPAIR Right 06/2012    Family History  Problem Relation Age of Onset   Hypertension Mother    Diabetes Father    CAD Father    Diabetes Sister    Heart disease Sister    CAD Sister     Social History   Socioeconomic History   Marital status: Widowed    Spouse name: Not on file   Number of children: 2   Years of education: Not on file   Highest education level: Associate degree: occupational, Hotel manager, or vocational program  Occupational History   Occupation: retired  Tobacco Use   Smoking status: Former    Packs/day: 2.00    Years: 15.00    Pack years: 30.00    Types: Cigarettes    Quit date: 06/25/1974    Years since quitting: 46.8   Smokeless tobacco: Never  Vaping Use   Vaping Use: Never used  Substance and Sexual Activity   Alcohol use: No   Drug use: No   Sexual activity: Not Currently  Other Topics Concern   Not on file  Social History Narrative   Not on file   Social Determinants of Health   Financial Resource Strain: Not on file  Food Insecurity: Not on file  Transportation Needs: Not on file  Physical Activity: Not on file  Stress: Not on file  Social Connections: Not on file  Intimate Partner Violence: Not on file    Outpatient Medications Prior to Visit  Medication Sig Dispense Refill   aspirin 81 MG tablet Take 81 mg by mouth daily.      cetirizine (ZYRTEC) 10 MG tablet TAKE 1 TABLET BY MOUTH EVERY DAY 90 tablet 0   Cholecalciferol (VITAMIN D3) 2000 UNITS capsule Take 2,000 Units by mouth daily.      Continuous Blood Gluc Sensor (FREESTYLE LIBRE 2 SENSOR) MISC USE AS DIRECTED. CONTINUOUS BLOOD SENSOR. 1 each 2   diphenhydrAMINE (BENADRYL) 25 mg capsule Take by mouth.      doxycycline (VIBRA-TABS) 100 MG tablet Take 1 tablet (100 mg total) by mouth 2 (two) times  daily. 20 tablet 0   gabapentin (NEURONTIN) 300 MG capsule TAKE 1 CAPSULE BY MOUTH TWICE A DAY 60 capsule 3   hydrochlorothiazide (HYDRODIURIL) 25 MG tablet TAKE 1 TABLET BY MOUTH EVERY DAY 90 tablet 1   ketoconazole (NIZORAL) 2 % cream Apply to feet and between toes QHS. 60 g 1   meclizine (ANTIVERT) 25 MG tablet Take 2 tablets (50 mg total) by mouth 3 (three) times daily as needed for dizziness. 60 tablet 1   metFORMIN (GLUCOPHAGE) 1000 MG tablet TAKE 1 TABLET (1,000 MG TOTAL) BY MOUTH 2 (TWO) TIMES DAILY WITH A MEAL. 180 tablet 3   MULTIPLE VITAMIN PO Take by mouth daily.      omeprazole (PRILOSEC) 20 MG capsule TAKE 1 CAPSULE BY MOUTH EVERY DAY 90 capsule 0   ONETOUCH ULTRA test strip USE DAILY AS DIRECTED 100 strip 4   pioglitazone (ACTOS) 15 MG tablet TAKE 1 TABLET BY MOUTH EVERY DAY 90 tablet 0   rOPINIRole (REQUIP) 0.25 MG tablet Take by mouth daily.      rosuvastatin (CRESTOR) 20 MG tablet TAKE 1 TABLET BY MOUTH EVERY DAY 90 tablet 1   Saw Palmetto, Serenoa repens, (SAW PALMETTO BERRY) 160 MG CAPS Take 2 tablets by mouth daily.      Semaglutide,0.25 or 0.5MG /DOS, (OZEMPIC, 0.25 OR 0.5 MG/DOSE,) 2 MG/1.5ML SOPN Inject 0.5 mg into the skin once a week. 1.5 mL 5   sildenafil (REVATIO) 20 MG tablet Take 3-5 tablets one hour prior to intercourse 60 tablet 1   tadalafil (CIALIS) 20 MG tablet Take 0.5-1 tablets (10-20 mg total) by mouth every other day as needed for erectile dysfunction. 30 tablet 11   telmisartan (MICARDIS) 40 MG tablet TAKE 1 TABLET BY MOUTH EVERY DAY 90 tablet 3   tiZANidine (ZANAFLEX) 4 MG tablet TAKE 1 TABLET BY MOUTH AT BEDTIME. 90 tablet 1   traMADol (ULTRAM) 50 MG tablet TAKE 1 TABLET BY MOUTH 3 TIMES A DAY AS NEEDED FOR BACK OR LEG PAIN 180 tablet 2   valACYclovir (VALTREX) 1000 MG tablet Take 1 tablet (1,000 mg total) by mouth 3 (three) times daily. 21 tablet 0   No facility-administered medications prior to visit.    No Known Allergies  Review of Systems   Musculoskeletal:  Positive for arthralgias (shoulder).      Objective:    Physical Exam Vitals reviewed.  Constitutional:      Appearance: Normal appearance. He is not diaphoretic.  HENT:     Head: Normocephalic and atraumatic.     Right Ear: External ear normal.     Left Ear: External ear normal.  Eyes:     General:  No scleral icterus.    Conjunctiva/sclera: Conjunctivae normal.  Neck:     Vascular: No carotid bruit.  Cardiovascular:     Rate and Rhythm: Normal rate and regular rhythm.     Pulses: Normal pulses.     Heart sounds: Normal heart sounds.  Pulmonary:     Effort: Pulmonary effort is normal.     Breath sounds: Normal breath sounds.  Abdominal:     Palpations: Abdomen is soft.  Lymphadenopathy:     Cervical: No cervical adenopathy.  Skin:    General: Skin is warm and dry.  Neurological:     General: No focal deficit present.     Mental Status: He is alert and oriented to person, place, and time.  Psychiatric:        Mood and Affect: Mood normal.        Behavior: Behavior normal.        Thought Content: Thought content normal.        Judgment: Judgment normal.    Depression screen [facility] 2/9 04/19/2021 11/15/2020 09/12/2020 03/14/2020 03/10/2019  Decreased Interest 2 0 1 0 0  Down, Depressed, Hopeless 1 0 0 0 0  PHQ - 2 Score 3 0 1 0 0  Altered sleeping 1 1 2  - -  Tired, decreased energy 1 1 1  - -  Change in appetite 0 0 0 - -  Feeling bad or failure about yourself  1 0 0 - -  Trouble concentrating 0 0 0 - -  Moving slowly or fidgety/restless 0 0 0 - -  Suicidal thoughts 0 0 0 - -  PHQ-9 Score 6 2 4  - -  Difficult doing work/chores Not difficult at all Not difficult at all Not difficult at all - -     BP 129/67 (BP Location: Right Arm, Patient Position: Sitting, Cuff Size: Large)   Pulse 86   Temp 97.8 F (36.6 C) (Oral)   Wt 228 lb (103.4 kg)   SpO2 98%   BMI 30.08 kg/m  Wt Readings from Last 3 Encounters:  04/19/21 228 lb (103.4 kg)  04/18/21  228 lb (103.4 kg)  04/02/21 235 lb 0.2 oz (106.6 kg)    Health Maintenance Due  Topic Date Due   Zoster Vaccines- Shingrix (1 of 2) Never done   TETANUS/TDAP  10/27/2015   COVID-19 Vaccine (4 - Booster for Pfizer series) 06/23/2020   FOOT EXAM  07/12/2020    There are no preventive care reminders to display for this patient.   Lab Results  Component Value Date   TSH 3.420 03/16/2020   Lab Results  Component Value Date   WBC 8.8 04/02/2021   HGB 13.9 04/02/2021   HCT 40.4 04/02/2021   MCV 98.5 04/02/2021   PLT 145 (L) 04/02/2021   Lab Results  Component Value Date   NA 139 04/02/2021   K 4.2 04/02/2021   CO2 28 04/02/2021   GLUCOSE 156 (H) 04/02/2021   BUN 24 (H) 04/02/2021   CREATININE 1.57 (H) 04/02/2021   BILITOT 0.9 04/02/2021   ALKPHOS 86 04/02/2021   AST 32 04/02/2021   ALT 26 04/02/2021   PROT 7.1 04/02/2021   ALBUMIN 4.0 04/02/2021   CALCIUM 9.6 04/02/2021   ANIONGAP 8 04/02/2021   Lab Results  Component Value Date   CHOL 121 03/16/2020   Lab Results  Component Value Date   HDL 29 (L) 03/16/2020   Lab Results  Component Value Date   LDLCALC 55 03/16/2020  Lab Results  Component Value Date   TRIG 227 (H) 03/16/2020   Lab Results  Component Value Date   CHOLHDL 4.2 03/16/2020   Lab Results  Component Value Date   HGBA1C 6.6 (A) 02/16/2021       Assessment & Plan:   Problem List Items Addressed This Visit   None   1. Type 2 diabetes mellitus with other specified complication, without long-term current use of insulin [facility]) Patient doing well with this.  A1c on next visit in early 2023 - Continuous Blood Gluc Sensor (FREESTYLE LIBRE 3 SENSOR) MISC; 1 each by Does not apply route every 14 (fourteen) days. Place 1 sensor on the skin every 14 days. Use to check glucose continuously  Dispense: 6 each; Refill: 3  2. Aortic valve disease Patient has more calcification of the aortic valve on recent CT scan.  It is time for follow-up of CAD  cardiology anyway.  He is presently completely asymptomatic for any heart disease heart failure, etc. - Ambulatory referral to Cardiology  3. Right shoulder pain, unspecified chronicity For to orthopedics for consideration . - Ambulatory referral to Orthopedic Surgery  4. Disorder of shoulder   5. Hypertensive left ventricular hypertrophy, without heart failure Good control  6. Allergic rhinitis, unspecified seasonality, unspecified trigger   7. Fatty liver disease, nonalcoholic Patient is lost 12 pounds in the last couple of months  8. Nephrolithiasis Saw urology this week.    I, [name], MD, have reviewed all documentation for this visit. The documentation on 04/19/21 for the exam, diagnosis, procedures, and orders are all accurate and complete.  Juluis Mire, CMA

## 2021-05-02 DIAGNOSIS — E782 Mixed hyperlipidemia: Secondary | ICD-10-CM | POA: Diagnosis not present

## 2021-05-02 DIAGNOSIS — I10 Essential (primary) hypertension: Secondary | ICD-10-CM | POA: Diagnosis not present

## 2021-05-02 DIAGNOSIS — I25118 Atherosclerotic heart disease of native coronary artery with other forms of angina pectoris: Secondary | ICD-10-CM | POA: Diagnosis not present

## 2021-05-02 DIAGNOSIS — I700 Atherosclerosis of aorta: Secondary | ICD-10-CM | POA: Diagnosis not present

## 2021-05-02 DIAGNOSIS — I119 Hypertensive heart disease without heart failure: Secondary | ICD-10-CM | POA: Diagnosis not present

## 2021-05-22 ENCOUNTER — Other Ambulatory Visit: Payer: Self-pay | Admitting: Family Medicine

## 2021-05-22 DIAGNOSIS — M5136 Other intervertebral disc degeneration, lumbar region: Secondary | ICD-10-CM

## 2021-05-22 DIAGNOSIS — J309 Allergic rhinitis, unspecified: Secondary | ICD-10-CM

## 2021-05-23 DIAGNOSIS — M25511 Pain in right shoulder: Secondary | ICD-10-CM | POA: Diagnosis not present

## 2021-05-31 DIAGNOSIS — I10 Essential (primary) hypertension: Secondary | ICD-10-CM | POA: Diagnosis not present

## 2021-05-31 DIAGNOSIS — I700 Atherosclerosis of aorta: Secondary | ICD-10-CM | POA: Diagnosis not present

## 2021-05-31 DIAGNOSIS — I25118 Atherosclerotic heart disease of native coronary artery with other forms of angina pectoris: Secondary | ICD-10-CM | POA: Diagnosis not present

## 2021-05-31 DIAGNOSIS — I5189 Other ill-defined heart diseases: Secondary | ICD-10-CM

## 2021-05-31 HISTORY — DX: Other ill-defined heart diseases: I51.89

## 2021-06-02 DIAGNOSIS — K219 Gastro-esophageal reflux disease without esophagitis: Secondary | ICD-10-CM | POA: Diagnosis not present

## 2021-06-02 DIAGNOSIS — R1314 Dysphagia, pharyngoesophageal phase: Secondary | ICD-10-CM | POA: Diagnosis not present

## 2021-06-05 ENCOUNTER — Telehealth: Payer: Self-pay

## 2021-06-05 DIAGNOSIS — E1169 Type 2 diabetes mellitus with other specified complication: Secondary | ICD-10-CM

## 2021-06-05 MED ORDER — FREESTYLE LIBRE 3 SENSOR MISC: 1.0000 | 3 refills | Status: DC

## 2021-06-05 NOTE — Telephone Encounter (Signed)
Refill was sent to pharmacy.

## 2021-06-05 NOTE — Telephone Encounter (Signed)
Patient is need of Freestyle Libre 2 Sensors sent into [facility] in [place]. Patient has been without these for 2 weeks.  [facility] is telling [facility] that they are waiting on [place] for approval.

## 2021-06-06 ENCOUNTER — Other Ambulatory Visit: Payer: Self-pay | Admitting: Family Medicine

## 2021-06-06 DIAGNOSIS — N529 Male erectile dysfunction, unspecified: Secondary | ICD-10-CM

## 2021-06-06 NOTE — Telephone Encounter (Signed)
Patient reports that he is taking the tadalafil because the Sildenafil he didn't like.

## 2021-06-07 ENCOUNTER — Other Ambulatory Visit: Payer: Self-pay | Admitting: Otolaryngology

## 2021-06-07 DIAGNOSIS — E782 Mixed hyperlipidemia: Secondary | ICD-10-CM | POA: Diagnosis not present

## 2021-06-07 DIAGNOSIS — I700 Atherosclerosis of aorta: Secondary | ICD-10-CM | POA: Diagnosis not present

## 2021-06-07 DIAGNOSIS — I2510 Atherosclerotic heart disease of native coronary artery without angina pectoris: Secondary | ICD-10-CM | POA: Diagnosis not present

## 2021-06-07 DIAGNOSIS — R1310 Dysphagia, unspecified: Secondary | ICD-10-CM

## 2021-06-07 DIAGNOSIS — I119 Hypertensive heart disease without heart failure: Secondary | ICD-10-CM | POA: Diagnosis not present

## 2021-06-07 DIAGNOSIS — I10 Essential (primary) hypertension: Secondary | ICD-10-CM | POA: Diagnosis not present

## 2021-06-08 ENCOUNTER — Other Ambulatory Visit: Payer: Self-pay | Admitting: Family Medicine

## 2021-06-08 DIAGNOSIS — J309 Allergic rhinitis, unspecified: Secondary | ICD-10-CM

## 2021-06-08 DIAGNOSIS — E1169 Type 2 diabetes mellitus with other specified complication: Secondary | ICD-10-CM

## 2021-06-08 NOTE — Telephone Encounter (Signed)
[facility] states [facility] did not get the prescription for Freestyle Lebre 2 Sensor.  [facility] [facility].   I advised pt to call back if he has anymore trouble.   Thanks,   -Mickel Baas

## 2021-06-13 ENCOUNTER — Other Ambulatory Visit: Payer: Self-pay | Admitting: Family Medicine

## 2021-06-18 ENCOUNTER — Telehealth: Payer: HMO | Admitting: Emergency Medicine

## 2021-06-18 DIAGNOSIS — U071 COVID-19: Secondary | ICD-10-CM

## 2021-06-18 MED ORDER — NIRMATRELVIR/RITONAVIR (PAXLOVID) TABLET (RENAL DOSING): 2.0000 | ORAL_TABLET | Freq: Two times a day (BID) | ORAL | 0 refills | Status: AC

## 2021-06-18 NOTE — Progress Notes (Signed)
Virtual Visit Consent   Manuel R Maryan Char., you are scheduled for a virtual visit with a [facility] provider today.     Just as with appointments in the office, your consent must be obtained to participate.  Your consent will be active for this visit and any virtual visit you may have with one of our providers in the next 365 days.     If you have a MyChart account, a copy of this consent can be sent to you electronically.  All virtual visits are billed to your insurance company just like a traditional visit in the office.    As this is a virtual visit, video technology does not allow for your provider to perform a traditional examination.  This may limit your provider's ability to fully assess your condition.  If your provider identifies any concerns that need to be evaluated in person or the need to arrange testing (such as labs, EKG, etc.), we will make arrangements to do so.     Although advances in technology are sophisticated, we cannot ensure that it will always work on either your end or our end.  If the connection with a video visit is poor, the visit may have to be switched to a telephone visit.  With either a video or telephone visit, we are not always able to ensure that we have a secure connection.     I need to obtain your verbal consent now.   Are you willing to proceed with your visit today? Yes   Manuel R Brenn Deziel. has provided verbal consent on 06/18/2021 for a virtual visit (video or telephone).   Lestine Box, [place]   Date: 06/18/2021 11:10 AM   Virtual Visit via Video Note   I, Lestine Box, connected with  Manuel Stafford.  ([id number], [birth year]) on 06/18/21 at 11:15 AM EST by a video-enabled telemedicine application and verified that I am speaking with the correct person using two identifiers.  Location: Patient: Virtual Visit Location Patient: Home Provider: Virtual Visit Location Provider: Home Office   I discussed the limitations of evaluation  and management by telemedicine and the availability of in person appointments. The patient expressed understanding and agreed to proceed.    History of Present Illness: Manuel R Alegandro Macnaughton. is a 75 y.o. who identifies as a male who was assigned male at birth, and is being seen today for fever, tmax of 100, congestion, cough and body aches x 1 day.  Denies sick exposure to COVID, flu or strep.  Tested positive for covid.  Has tried OTC medications without relief.  Denies aggravating factors.  Denies previous symptoms in the past.   Denies sore throat, SOB, wheezing, chest pain, nausea, changes in bowel or bladder habits.     ROS: As per HPI.  All other pertinent ROS negative.       HPI: HPI  Problems:  Patient Active Problem List   Diagnosis Date Noted   Long term (current) use of non-steroidal anti-inflammatories (nsaid) 07/13/2019   Shoulder pain 03/21/2016   Hydronephrosis with urinary obstruction due to ureteral calculus 09/26/2015   Microscopic hematuria 09/26/2015   Hypertensive left ventricular hypertrophy 09/13/2015   Nephrolithiasis 08/29/2015   Right ureteral stone 08/29/2015   Abnormal ECG 08/22/2015   Benign essential HTN 08/22/2015   Combined fat and carbohydrate induced hyperlipemia 08/22/2015   Breathlessness on exertion 08/22/2015   Lung nodules 04/28/2015   Allergic rhinitis 12/31/2014   Diabetes ([place]) 12/31/2014   Erectile  dysfunction 12/31/2014   Fatty liver disease, nonalcoholic 38/88/7579   Acid reflux 12/31/2014   BP (high blood pressure) 12/31/2014   HLD (hyperlipidemia) 12/31/2014   Lumbosacral spondylosis without myelopathy 12/31/2014   Adiposity 12/31/2014   Herpes zona 12/31/2014   Disorder of shoulder 12/31/2014   Apnea, sleep 12/31/2014   Avitaminosis D 12/31/2014   DDD (degenerative disc disease), lumbar 11/30/2013   Back ache 11/30/2013    Allergies: No Known Allergies Medications:  Current Outpatient Medications:    nirmatrelvir/ritonavir  EUA, renal dosing, (PAXLOVID) 10 x 150 MG & 10 x 100MG  TABS, Take 2 tablets by mouth 2 (two) times daily for 5 days. (Take nirmatrelvir 150 mg one tablet twice daily for 5 days and ritonavir 100 mg one tablet twice daily for 5 days) Patient GFR is 45, Disp: 20 tablet, Rfl: 0   aspirin 81 MG tablet, Take 81 mg by mouth daily. , Disp: , Rfl:    cetirizine (ZYRTEC) 10 MG tablet, TAKE 1 TABLET BY MOUTH EVERY DAY, Disp: 90 tablet, Rfl: 0   Cholecalciferol (VITAMIN D3) 2000 UNITS capsule, Take 2,000 Units by mouth daily. , Disp: , Rfl:    Continuous Blood Gluc Sensor (FREESTYLE LIBRE 2 SENSOR) MISC, USE AS DIRECTED. CONTINUOUS BLOOD SENSOR., Disp: 6 each, Rfl: 3   Continuous Blood Gluc Sensor (FREESTYLE LIBRE 3 SENSOR) MISC, 1 each by Does not apply route every 14 (fourteen) days. Place 1 sensor on the skin every 14 days. Use to check glucose continuously, Disp: 6 each, Rfl: 3   diphenhydrAMINE (BENADRYL) 25 mg capsule, Take by mouth. , Disp: , Rfl:    doxycycline (VIBRA-TABS) 100 MG tablet, Take 1 tablet (100 mg total) by mouth 2 (two) times daily., Disp: 20 tablet, Rfl: 0   gabapentin (NEURONTIN) 300 MG capsule, TAKE 1 CAPSULE BY MOUTH TWICE A DAY, Disp: 60 capsule, Rfl: 3   hydrochlorothiazide (HYDRODIURIL) 25 MG tablet, TAKE 1 TABLET BY MOUTH EVERY DAY, Disp: 90 tablet, Rfl: 1   ketoconazole (NIZORAL) 2 % cream, Apply to feet and between toes QHS., Disp: 60 g, Rfl: 1   meclizine (ANTIVERT) 25 MG tablet, Take 2 tablets (50 mg total) by mouth 3 (three) times daily as needed for dizziness., Disp: 60 tablet, Rfl: 1   metFORMIN (GLUCOPHAGE) 1000 MG tablet, TAKE 1 TABLET (1,000 MG TOTAL) BY MOUTH 2 (TWO) TIMES DAILY WITH A MEAL., Disp: 180 tablet, Rfl: 3   MULTIPLE VITAMIN PO, Take by mouth daily. , Disp: , Rfl:    omeprazole (PRILOSEC) 20 MG capsule, TAKE 1 CAPSULE BY MOUTH EVERY DAY, Disp: 90 capsule, Rfl: 0   ONETOUCH ULTRA test strip, USE DAILY AS DIRECTED, Disp: 100 strip, Rfl: 4   pioglitazone (ACTOS)  15 MG tablet, TAKE 1 TABLET BY MOUTH EVERY DAY, Disp: 90 tablet, Rfl: 0   rOPINIRole (REQUIP) 0.25 MG tablet, Take by mouth daily. , Disp: , Rfl:    rosuvastatin (CRESTOR) 20 MG tablet, TAKE 1 TABLET BY MOUTH EVERY DAY, Disp: 90 tablet, Rfl: 1   Saw Palmetto, Serenoa repens, (SAW PALMETTO BERRY) 160 MG CAPS, Take 2 tablets by mouth daily. , Disp: , Rfl:    Semaglutide,0.25 or 0.5MG /DOS, (OZEMPIC, 0.25 OR 0.5 MG/DOSE,) 2 MG/1.5ML SOPN, Inject 0.5 mg into the skin once a week., Disp: 1.5 mL, Rfl: 5   tadalafil (CIALIS) 20 MG tablet, TAKE 0.5-1 TABLET BY MOUTH EVERY OTHER DAY AS NEEDED FOR ERECTILE DYSFUNCTION, Disp: 30 tablet, Rfl: 11   telmisartan (MICARDIS) 40 MG tablet, TAKE  1 TABLET BY MOUTH EVERY DAY, Disp: 90 tablet, Rfl: 3   tiZANidine (ZANAFLEX) 4 MG tablet, TAKE 1 TABLET BY MOUTH EVERYDAY AT BEDTIME, Disp: 90 tablet, Rfl: 1   traMADol (ULTRAM) 50 MG tablet, TAKE 1 TABLET BY MOUTH 3 TIMES A DAY AS NEEDED FOR BACK OR LEG PAIN, Disp: 180 tablet, Rfl: 2   valACYclovir (VALTREX) 1000 MG tablet, Take 1 tablet (1,000 mg total) by mouth 3 (three) times daily., Disp: 21 tablet, Rfl: 0  Observations/Objective: Patient is well-developed, well-nourished in no acute distress.  Resting comfortably at home. Mildly fatigued appearing, but nontoxic Head is normocephalic, atraumatic.  No labored breathing. Speaking in full sentences, but tolerating own secretions Speech is clear and coherent with logical content.  Patient is alert and oriented at baseline.    Assessment and Plan: 1. COVID-19 virus infection  COVID test was positive You should remain isolated in your home for 5 days from symptom onset AND greater than 72 hours after symptoms resolution (absence of fever without the use of fever-reducing medication and improvement in respiratory symptoms), whichever is longer Get plenty of rest and push fluids Paxlovid prescribed.  We will do renal dosing given GFR of 45.   Use zyrtec for nasal  congestion, runny nose, and/or sore throat Use flonase for nasal congestion and runny nose Use medications daily for symptom relief Use OTC medications like ibuprofen or tylenol as needed fever or pain Follow up with PCP in 1-2 days via phone or e-visit for recheck and to ensure symptoms are improving Call or go to the ED if you have any new or worsening symptoms such as fever, worsening cough, shortness of breath, chest tightness, chest pain, turning blue, changes in mental status, etc...    Follow Up Instructions: I discussed the assessment and treatment plan with the patient. The patient was provided an opportunity to ask questions and all were answered. The patient agreed with the plan and demonstrated an understanding of the instructions.  A copy of instructions were sent to the patient via MyChart unless otherwise noted below.    The patient was advised to call back or seek an in-person evaluation if the symptoms worsen or if the condition fails to improve as anticipated.  Time:  I spent 5-10 minutes with the patient via telehealth technology discussing the above problems/concerns.    Lestine Box, PA-C

## 2021-06-18 NOTE — Patient Instructions (Signed)
Manuel R Maryan Char., thank you for joining Lestine Box, PA-C for today's virtual visit.  While this provider is not your primary care provider (PCP), if your PCP is located in our provider database this encounter information will be shared with them immediately following your visit.  Consent: (Patient) Manuel R Maryan Char. provided verbal consent for this virtual visit at the beginning of the encounter.  Current Medications:  Current Outpatient Medications:    nirmatrelvir/ritonavir EUA, renal dosing, (PAXLOVID) 10 x 150 MG & 10 x 100MG  TABS, Take 2 tablets by mouth 2 (two) times daily for 5 days. (Take nirmatrelvir 150 mg one tablet twice daily for 5 days and ritonavir 100 mg one tablet twice daily for 5 days) Patient GFR is 45, Disp: 20 tablet, Rfl: 0   aspirin 81 MG tablet, Take 81 mg by mouth daily. , Disp: , Rfl:    cetirizine (ZYRTEC) 10 MG tablet, TAKE 1 TABLET BY MOUTH EVERY DAY, Disp: 90 tablet, Rfl: 0   Cholecalciferol (VITAMIN D3) 2000 UNITS capsule, Take 2,000 Units by mouth daily. , Disp: , Rfl:    Continuous Blood Gluc Sensor (FREESTYLE LIBRE 2 SENSOR) MISC, USE AS DIRECTED. CONTINUOUS BLOOD SENSOR., Disp: 6 each, Rfl: 3   Continuous Blood Gluc Sensor (FREESTYLE LIBRE 3 SENSOR) MISC, 1 each by Does not apply route every 14 (fourteen) days. Place 1 sensor on the skin every 14 days. Use to check glucose continuously, Disp: 6 each, Rfl: 3   diphenhydrAMINE (BENADRYL) 25 mg capsule, Take by mouth. , Disp: , Rfl:    doxycycline (VIBRA-TABS) 100 MG tablet, Take 1 tablet (100 mg total) by mouth 2 (two) times daily., Disp: 20 tablet, Rfl: 0   gabapentin (NEURONTIN) 300 MG capsule, TAKE 1 CAPSULE BY MOUTH TWICE A DAY, Disp: 60 capsule, Rfl: 3   hydrochlorothiazide (HYDRODIURIL) 25 MG tablet, TAKE 1 TABLET BY MOUTH EVERY DAY, Disp: 90 tablet, Rfl: 1   ketoconazole (NIZORAL) 2 % cream, Apply to feet and between toes QHS., Disp: 60 g, Rfl: 1   meclizine (ANTIVERT) 25 MG tablet, Take 2  tablets (50 mg total) by mouth 3 (three) times daily as needed for dizziness., Disp: 60 tablet, Rfl: 1   metFORMIN (GLUCOPHAGE) 1000 MG tablet, TAKE 1 TABLET (1,000 MG TOTAL) BY MOUTH 2 (TWO) TIMES DAILY WITH A MEAL., Disp: 180 tablet, Rfl: 3   MULTIPLE VITAMIN PO, Take by mouth daily. , Disp: , Rfl:    omeprazole (PRILOSEC) 20 MG capsule, TAKE 1 CAPSULE BY MOUTH EVERY DAY, Disp: 90 capsule, Rfl: 0   ONETOUCH ULTRA test strip, USE DAILY AS DIRECTED, Disp: 100 strip, Rfl: 4   pioglitazone (ACTOS) 15 MG tablet, TAKE 1 TABLET BY MOUTH EVERY DAY, Disp: 90 tablet, Rfl: 0   rOPINIRole (REQUIP) 0.25 MG tablet, Take by mouth daily. , Disp: , Rfl:    rosuvastatin (CRESTOR) 20 MG tablet, TAKE 1 TABLET BY MOUTH EVERY DAY, Disp: 90 tablet, Rfl: 1   Saw Palmetto, Serenoa repens, (SAW PALMETTO BERRY) 160 MG CAPS, Take 2 tablets by mouth daily. , Disp: , Rfl:    Semaglutide,0.25 or 0.5MG /DOS, (OZEMPIC, 0.25 OR 0.5 MG/DOSE,) 2 MG/1.5ML SOPN, Inject 0.5 mg into the skin once a week., Disp: 1.5 mL, Rfl: 5   tadalafil (CIALIS) 20 MG tablet, TAKE 0.5-1 TABLET BY MOUTH EVERY OTHER DAY AS NEEDED FOR ERECTILE DYSFUNCTION, Disp: 30 tablet, Rfl: 11   telmisartan (MICARDIS) 40 MG tablet, TAKE 1 TABLET BY MOUTH EVERY DAY, Disp: 90 tablet, Rfl:  3   tiZANidine (ZANAFLEX) 4 MG tablet, TAKE 1 TABLET BY MOUTH EVERYDAY AT BEDTIME, Disp: 90 tablet, Rfl: 1   traMADol (ULTRAM) 50 MG tablet, TAKE 1 TABLET BY MOUTH 3 TIMES A DAY AS NEEDED FOR BACK OR LEG PAIN, Disp: 180 tablet, Rfl: 2   valACYclovir (VALTREX) 1000 MG tablet, Take 1 tablet (1,000 mg total) by mouth 3 (three) times daily., Disp: 21 tablet, Rfl: 0   Medications ordered in this encounter:  Meds ordered this encounter  Medications   nirmatrelvir/ritonavir EUA, renal dosing, (PAXLOVID) 10 x 150 MG & 10 x 100MG  TABS    Sig: Take 2 tablets by mouth 2 (two) times daily for 5 days. (Take nirmatrelvir 150 mg one tablet twice daily for 5 days and ritonavir 100 mg one tablet  twice daily for 5 days) Patient GFR is 45    Dispense:  20 tablet    Refill:  0    Order Specific Question:   Supervising Provider    Answer:   Noemi Chapel [[id number]]     *If you need refills on other medications prior to your next appointment, please contact your pharmacy*  Follow-Up: Call back or seek an in-person evaluation if the symptoms worsen or if the condition fails to improve as anticipated.  Other Instructions COVID test was positive You should remain isolated in your home for 5 days from symptom onset AND greater than 72 hours after symptoms resolution (absence of fever without the use of fever-reducing medication and improvement in respiratory symptoms), whichever is longer Get plenty of rest and push fluids Paxlovid prescribed.  We will do renal dosing given GFR of 45.   Use zyrtec for nasal congestion, runny nose, and/or sore throat Use flonase for nasal congestion and runny nose Use medications daily for symptom relief Use OTC medications like ibuprofen or tylenol as needed fever or pain Follow up with PCP in 1-2 days via phone or e-visit for recheck and to ensure symptoms are improving Call or go to the ED if you have any new or worsening symptoms such as fever, worsening cough, shortness of breath, chest tightness, chest pain, turning blue, changes in mental status, etc...     If you have been instructed to have an in-person evaluation today at a local Urgent Care facility, please use the link below. It will take you to a list of all of our available [facility], including address, phone number and hours of operation. Please do not delay care.  [facility]  If you or a family member do not have a primary care provider, use the link below to schedule a visit and establish care. When you choose a [facility] primary care physician or advanced practice provider, you gain a long-term partner in health. Find a Primary Care Provider  Learn more about  's in-office and virtual care options: [facility]

## 2021-06-21 ENCOUNTER — Ambulatory Visit: Payer: HMO

## 2021-07-04 ENCOUNTER — Ambulatory Visit
Admission: RE | Admit: 2021-07-04 | Discharge: 2021-07-04 | Disposition: A | Discharge status: Home / Self Care | Payer: HMO | Source: Ambulatory Visit | Attending: Otolaryngology | Admitting: Otolaryngology

## 2021-07-04 DIAGNOSIS — R1310 Dysphagia, unspecified: Secondary | ICD-10-CM

## 2021-07-05 DIAGNOSIS — H5203 Hypermetropia, bilateral: Secondary | ICD-10-CM | POA: Diagnosis not present

## 2021-07-05 DIAGNOSIS — H2513 Age-related nuclear cataract, bilateral: Secondary | ICD-10-CM | POA: Diagnosis not present

## 2021-07-05 DIAGNOSIS — H524 Presbyopia: Secondary | ICD-10-CM | POA: Diagnosis not present

## 2021-07-05 DIAGNOSIS — H52213 Irregular astigmatism, bilateral: Secondary | ICD-10-CM | POA: Diagnosis not present

## 2021-07-05 DIAGNOSIS — E119 Type 2 diabetes mellitus without complications: Secondary | ICD-10-CM | POA: Diagnosis not present

## 2021-07-05 LAB — HM DIABETES EYE EXAM

## 2021-07-13 ENCOUNTER — Encounter: Payer: Self-pay | Admitting: *Deleted

## 2021-07-17 ENCOUNTER — Ambulatory Visit: Payer: HMO | Admitting: Family Medicine

## 2021-07-17 NOTE — Progress Notes (Signed)
07/18/2021 4:57 PM   Manuel R Maryan Char. 1945/12/09 [id number]  Referring provider: [name], MD [address] [place],  [place] [address]  Chief Complaint  Patient presents with   Nephrolithiasis   Urological history: 1. Nephrolithiasis -stone composition 65% calcium oxalate, 5% calcium phosphate, 30% uric acid -URS 2017  2. ED -contributing factors of age, HTN, sleep apnea, diabetes, HLD and obesity -manage with tadalafil 20 mg, on-demand-dosing  HPI: Manuel Stafford. Is a 76 y.o. male who presents today for pain in lower abdomen.    UA negative  KUB punctate calcifications just below the left pelvic brim that may represent left ureteral stones  PVR 27 mL    He states that he has been having left back pain that radiates into the left side off-and-on for the last several months.  It is becoming somewhat more frequent and reminiscent of previous stone episodes he has had in the past and he is concerned that he may end up in the emergency room in the middle the night due to the pain.  Patient denies any modifying or aggravating factors.  Patient denies any gross hematuria, dysuria or suprapubic.  Patient denies any fevers, chills, nausea or vomiting.    He is also having some issues with erectile dysfunction and ejaculatory disorders.  He states he is able to achieve a somewhat satisfactory erection for intercourse with PDE 5 inhibitors but would like further discussion on how to take the medication.  He has tadalafil 20 mg and sildenafil 20 mg on hand at home.  He is wondering if he can take these medications back to back.  He is also experiencing a delayed/retrograde ejaculation.  He states he is able to have a good orgasm, but the ejaculate fluid just dribbles out shortly after.  He is having no pain or curvature with ejaculation.  PMH: Past Medical History:  Diagnosis Date   Back pain    lower   Diabetes mellitus without complication  ([place])    Patient takes Metformin.   Elevated lipids    GERD (gastroesophageal reflux disease)    Hemorrhoids 2017   Hyperlipidemia    Hypertension    Kidney stone 2017   Parkinsonian features     Surgical History: Past Surgical History:  Procedure Laterality Date   CIRCUMCISION     COLONOSCOPY  2007   COLONOSCOPY WITH PROPOFOL N/A 08/22/2016   Procedure: COLONOSCOPY WITH PROPOFOL;  Surgeon: Christene Lye, MD;  Location: [facility];  Service: Endoscopy;  Laterality: N/A;   CYSTOSCOPY WITH STENT PLACEMENT Right 09/18/2015   Procedure: CYSTOSCOPY WITH STENT PLACEMENT;  Surgeon: Hollice Espy, MD;  Location: [facility] ORS;  Service: Urology;  Laterality: Right;   CYSTOSCOPY WITH URETEROSCOPY, STONE BASKETRY AND STENT PLACEMENT Right 10/03/2015   Procedure: CYSTOSCOPY WITH URETEROSCOPY, STONE BASKETRY AND STENT PLACEMENT;  Surgeon: Hollice Espy, MD;  Location: [facility] ORS;  Service: Urology;  Laterality: Right;   EXTRACORPOREAL SHOCK WAVE LITHOTRIPSY Right 09/15/2015   Procedure: EXTRACORPOREAL SHOCK WAVE LITHOTRIPSY (ESWL);  Surgeon: Nickie Retort, MD;  Location: [facility] ORS;  Service: Urology;  Laterality: Right;   HERNIA REPAIR     ROTATOR CUFF REPAIR Right 06/2012    Home Medications:  Allergies as of 07/18/2021   No Known Allergies      Medication List        Accurate as of July 18, 2021 11:59 PM. If you have any questions, ask your nurse or doctor.  aspirin 81 MG tablet Take 81 mg by mouth daily.   cetirizine 10 MG tablet Commonly known as: ZYRTEC TAKE 1 TABLET BY MOUTH EVERY DAY   cyproheptadine 4 MG tablet Commonly known as: PERIACTIN Take one tablet 3 hours prior to intercourse Started by: Zara Council, PA-C   diphenhydrAMINE 25 mg capsule Commonly known as: BENADRYL Take by mouth.   doxycycline 100 MG tablet Commonly known as: VIBRA-TABS Take 1 tablet (100 mg total) by mouth 2 (two) times daily.   FreeStyle Libre 3 Sensor Misc 1  each by Does not apply route every 14 (fourteen) days. Place 1 sensor on the skin every 14 days. Use to check glucose continuously   FreeStyle Libre 2 Sensor Misc USE AS DIRECTED. CONTINUOUS BLOOD SENSOR.   gabapentin 300 MG capsule Commonly known as: NEURONTIN TAKE 1 CAPSULE BY MOUTH TWICE A DAY   hydrochlorothiazide 25 MG tablet Commonly known as: HYDRODIURIL TAKE 1 TABLET BY MOUTH EVERY DAY   HYDROcodone-acetaminophen 10-325 MG tablet Commonly known as: NORCO Take 1 tablet by mouth every 6 (six) hours as needed. Started by: Zara Council, PA-C   ketoconazole 2 % cream Commonly known as: NIZORAL Apply to feet and between toes QHS.   meclizine 25 MG tablet Commonly known as: ANTIVERT Take 2 tablets (50 mg total) by mouth 3 (three) times daily as needed for dizziness.   metFORMIN 1000 MG tablet Commonly known as: GLUCOPHAGE TAKE 1 TABLET (1,000 MG TOTAL) BY MOUTH 2 (TWO) TIMES DAILY WITH A MEAL.   MULTIPLE VITAMIN PO Take by mouth daily.   omeprazole 20 MG capsule Commonly known as: PRILOSEC TAKE 1 CAPSULE BY MOUTH EVERY DAY   OneTouch Ultra test strip Generic drug: glucose blood USE DAILY AS DIRECTED   Ozempic (0.25 or 0.5 MG/DOSE) 2 MG/1.5ML Sopn Generic drug: Semaglutide(0.25 or 0.5MG /DOS) Inject 0.5 mg into the skin once a week.   pantoprazole 40 MG tablet Commonly known as: PROTONIX Take 40 mg by mouth daily.   pioglitazone 15 MG tablet Commonly known as: ACTOS TAKE 1 TABLET BY MOUTH EVERY DAY   rOPINIRole 0.25 MG tablet Commonly known as: REQUIP Take by mouth daily.   rosuvastatin 20 MG tablet Commonly known as: CRESTOR TAKE 1 TABLET BY MOUTH EVERY DAY   Saw Palmetto Berry 160 MG Caps Take 2 tablets by mouth daily.   tadalafil 20 MG tablet Commonly known as: CIALIS TAKE 0.5-1 TABLET BY MOUTH EVERY OTHER DAY AS NEEDED FOR ERECTILE DYSFUNCTION   tamsulosin 0.4 MG Caps capsule Commonly known as: FLOMAX Take 1 capsule (0.4 mg total) by mouth  daily. Started by: Zara Council, PA-C   telmisartan 40 MG tablet Commonly known as: MICARDIS TAKE 1 TABLET BY MOUTH EVERY DAY   tiZANidine 4 MG tablet Commonly known as: ZANAFLEX TAKE 1 TABLET BY MOUTH EVERYDAY AT BEDTIME   traMADol 50 MG tablet Commonly known as: ULTRAM TAKE 1 TABLET BY MOUTH 3 TIMES A DAY AS NEEDED FOR BACK OR LEG PAIN   valACYclovir 1000 MG tablet Commonly known as: VALTREX Take 1 tablet (1,000 mg total) by mouth 3 (three) times daily.   Vitamin D3 50 MCG (2000 UT) capsule Take 2,000 Units by mouth daily.        Allergies: No Known Allergies  Family History: Family History  Problem Relation Age of Onset   Hypertension Mother    Diabetes Father    CAD Father    Diabetes Sister    Heart disease Sister    CAD  Sister     Social History:  reports that he quit smoking about 47 years ago. His smoking use included cigarettes. He has a 30.00 pack-year smoking history. He has never used smokeless tobacco. He reports that he does not drink alcohol and does not use drugs.  ROS: Pertinent ROS in HPI  Physical Exam: BP 130/74    Pulse 99    Ht 6' (1.829 m)    Wt 218 lb (98.9 kg)    BMI 29.57 kg/m   Constitutional:  Well nourished. Alert and oriented, No acute distress. HEENT: [place] AT, mask in place.  Trachea midline Cardiovascular: No clubbing, cyanosis, or edema. Respiratory: Normal respiratory effort, no increased work of breathing. Neurologic: Grossly intact, no focal deficits, moving all 4 extremities. Psychiatric: Normal mood and affect.  Laboratory Data: Lab Results  Component Value Date   WBC 8.8 04/02/2021   HGB 13.9 04/02/2021   HCT 40.4 04/02/2021   MCV 98.5 04/02/2021   PLT 145 (L) 04/02/2021    Lab Results  Component Value Date   CREATININE 1.57 (H) 04/02/2021     Lab Results  Component Value Date   HGBA1C 6.6 (A) 02/16/2021    Lab Results  Component Value Date   AST 32 04/02/2021   Lab Results  Component Value Date    ALT 26 04/02/2021     Urinalysis Results for orders placed or performed in visit on 07/18/21  Microscopic Examination   Urine  Result Value Ref Range   WBC, UA None seen 0 - 5 /hpf   RBC None seen 0 - 2 /hpf   Epithelial Cells (non renal) 0-10 0 - 10 /hpf   Bacteria, UA None seen None seen/Few  Urinalysis, Complete  Result Value Ref Range   Specific Gravity, UA 1.020 1.005 - 1.030   pH, UA 5.5 5.0 - 7.5   Color, UA Yellow Yellow   Appearance Ur Clear Clear   Leukocytes,UA Negative Negative   Protein,UA Negative Negative/Trace   Glucose, UA Negative Negative   Ketones, UA Negative Negative   RBC, UA Negative Negative   Bilirubin, UA Negative Negative   Urobilinogen, Ur 0.2 0.2 - 1.0 mg/dL   Nitrite, UA Negative Negative   Microscopic Examination See below:   Bladder Scan (Post Void Residual) in office  Result Value Ref Range   Scan Result 37mL   I have reviewed the labs.   Pertinent Imaging:  07/18/21 15:18  Scan Result 64mL   CLINICAL DATA:  Kidney stones   EXAM: ABDOMEN - 1 VIEW   COMPARISON:  09/26/2015, CT 04/02/2021   FINDINGS: Nonobstructed bowel gas pattern. Punctate 3 mm calcification overlying left kidney.   IMPRESSION: Probable 3 mm left kidney stone.     Electronically Signed   By: Donavan Foil M.D.   On: 07/19/2021 20:34 I have independently reviewed the films.  See HPI.   Assessment & Plan:    1. Nephrolithiasis -Punctate calcification just below the left pelvic brim which may represent distal ureteral stones -An abundance of caution I prescribed Vicodin 10/26/2023, #5, every 6 hours, prn for pain, tamsulosin 0.4 mg daily given a strainer as he states his pain is reminiscent of previous stones he has had in the past and I would like to limit visits to the emergency room -I did review red flag symptoms and instructed him to contact us immediately or go to the emergency room if any of those develop   2. ED -I explained that it would  be fine  to take tadalafil 20 mg 1 day and then the next day he could take sildenafil  3.  Ejaculatory disorder -prescribed Periactin 4 mg, take 1 tablet 3 hours prior to intercourse   Return in about 2 weeks (around 08/01/2021) for KUB and office visit .  These notes generated with voice recognition software. I apologize for typographical errors.  Zara Council, PA-C  [facility] [address]  [place], [place] [address] [phone]

## 2021-07-18 ENCOUNTER — Other Ambulatory Visit: Payer: Self-pay

## 2021-07-18 ENCOUNTER — Ambulatory Visit
Admission: RE | Admit: 2021-07-18 | Discharge: 2021-07-18 | Disposition: A | Discharge status: Home / Self Care | Payer: HMO | Source: Ambulatory Visit | Attending: Urology | Admitting: Urology

## 2021-07-18 ENCOUNTER — Encounter: Payer: Self-pay | Admitting: Urology

## 2021-07-18 ENCOUNTER — Ambulatory Visit: Payer: HMO | Admitting: Urology

## 2021-07-18 VITALS — BP 130/74 | HR 99 | Ht 72.0 in | Wt 218.0 lb

## 2021-07-18 DIAGNOSIS — N200 Calculus of kidney: Secondary | ICD-10-CM

## 2021-07-18 DIAGNOSIS — F5232 Male orgasmic disorder: Secondary | ICD-10-CM

## 2021-07-18 DIAGNOSIS — N2889 Other specified disorders of kidney and ureter: Secondary | ICD-10-CM | POA: Diagnosis not present

## 2021-07-18 LAB — URINALYSIS, COMPLETE
Bilirubin, UA: NEGATIVE
Glucose, UA: NEGATIVE
Ketones, UA: NEGATIVE
Leukocytes,UA: NEGATIVE
Nitrite, UA: NEGATIVE
Protein,UA: NEGATIVE
RBC, UA: NEGATIVE
Specific Gravity, UA: 1.02 (ref 1.005–1.030)
Urobilinogen, Ur: 0.2 mg/dL (ref 0.2–1.0)
pH, UA: 5.5 (ref 5.0–7.5)

## 2021-07-18 LAB — MICROSCOPIC EXAMINATION
Bacteria, UA: NONE SEEN
RBC, Urine: NONE SEEN /hpf (ref 0–2)
WBC, UA: NONE SEEN /hpf (ref 0–5)

## 2021-07-18 LAB — BLADDER SCAN AMB NON-IMAGING

## 2021-07-18 MED ORDER — TAMSULOSIN HCL 0.4 MG PO CAPS: 0.4000 mg | ORAL_CAPSULE | Freq: Every day | ORAL | 3 refills | Status: DC

## 2021-07-18 MED ORDER — CYPROHEPTADINE HCL 4 MG PO TABS: ORAL_TABLET | ORAL | 3 refills | Status: DC

## 2021-07-18 MED ORDER — HYDROCODONE-ACETAMINOPHEN 10-325 MG PO TABS: 1.0000 | ORAL_TABLET | Freq: Four times a day (QID) | ORAL | 0 refills | Status: DC | PRN

## 2021-07-24 ENCOUNTER — Inpatient Hospital Stay: Admit: 2021-07-24 | Payer: HMO

## 2021-07-26 ENCOUNTER — Telehealth: Payer: Self-pay | Admitting: Urology

## 2021-07-26 MED ORDER — TADALAFIL 5 MG PO TABS: 5.0000 mg | ORAL_TABLET | Freq: Every day | ORAL | 0 refills | Status: DC

## 2021-07-26 NOTE — Telephone Encounter (Signed)
Spoke with patient and advised results rx sent to pharmacy by e-script  

## 2021-07-26 NOTE — Telephone Encounter (Signed)
Patient was seen in the office on 1/24 and was given a new prescription for tamsulosin.  He states that the medication is having some side effects that he is not comfortable with continuing.  He is experiencing sleepiness and low energy level and is normally very active.   He is requesting a call back from [facility] or her clinical staff to discuss these effects and if there is another medication he can try.

## 2021-07-27 ENCOUNTER — Other Ambulatory Visit: Payer: Self-pay

## 2021-07-27 ENCOUNTER — Encounter: Payer: Self-pay | Admitting: Family Medicine

## 2021-07-27 ENCOUNTER — Ambulatory Visit (INDEPENDENT_AMBULATORY_CARE_PROVIDER_SITE_OTHER): Payer: HMO | Admitting: Family Medicine

## 2021-07-27 VITALS — BP 131/75 | HR 85 | Temp 98.5°F | Resp 16 | Ht 73.0 in | Wt 224.0 lb

## 2021-07-27 DIAGNOSIS — E7849 Other hyperlipidemia: Secondary | ICD-10-CM | POA: Diagnosis not present

## 2021-07-27 DIAGNOSIS — E1169 Type 2 diabetes mellitus with other specified complication: Secondary | ICD-10-CM | POA: Diagnosis not present

## 2021-07-27 DIAGNOSIS — N200 Calculus of kidney: Secondary | ICD-10-CM

## 2021-07-27 DIAGNOSIS — I10 Essential (primary) hypertension: Secondary | ICD-10-CM | POA: Diagnosis not present

## 2021-07-27 DIAGNOSIS — M19019 Primary osteoarthritis, unspecified shoulder: Secondary | ICD-10-CM | POA: Diagnosis not present

## 2021-07-27 NOTE — Progress Notes (Signed)
Established patient visit  I,[name],acting as a scribe for [name], MD.,have documented all relevant documentation on the behalf of [name], MD,as directed by  [name], MD while in the presence of [name], MD.   Patient: Manuel Stafford.   DOB: 03/02/1946   76 y.o. Male  MRN: [id number] Visit Date: 07/27/2021  Today's healthcare provider: [name], MD   Chief Complaint  Patient presents with   Follow-up   Diabetes   Hypertension   Hyperlipidemia   Subjective    HPI  Patient is feeling well and has no complaints.  He is adjusting to being a widower as his wife died last fall.  He is doing surprisingly well.  She intentionally lost 18 pounds in recent months and has had some kidney stones and some shoulder pain but otherwise feels well and is tolerating his medications well Diabetes Mellitus Type II, follow-up  Lab Results  Component Value Date   HGBA1C 6.6 (A) 02/16/2021   HGBA1C 6.5 (H) 09/12/2020   HGBA1C 7.0 (A) 05/11/2020   Last seen for diabetes 4 months ago.  Management since then includes continuing the same treatment. He reports good compliance with treatment. He is not having side effects. none  Home blood sugar records: fasting range: 120  Episodes of hypoglycemia? No none   Current insulin regiment: n/a Most Recent Eye Exam: 07/05/2021  --------------------------------------------------------------------------------------------------- Hypertension, follow-up  BP Readings from Last 3 Encounters:  07/27/21 131/75  07/18/21 130/74  04/19/21 129/67   Wt Readings from Last 3 Encounters:  07/27/21 224 lb (101.6 kg)  07/18/21 218 lb (98.9 kg)  04/19/21 228 lb (103.4 kg)     He was last seen for hypertension 4 months ago.  BP at that visit was 129/67. Management since that visit includes; Good control. He reports good compliance with treatment. He is not having side effects. none He is  exercising. He is not adherent to low salt diet.   Outside blood pressures are 125/68.  He does not smoke.  Use of agents associated with hypertension: none.   --------------------------------------------------------------------------------------------------- Lipid/Cholesterol, follow-up  Last Lipid Panel: Lab Results  Component Value Date   CHOL 121 03/16/2020   LDLCALC 55 03/16/2020   HDL 29 (L) 03/16/2020   TRIG 227 (H) 03/16/2020    He was last seen for this 9/22/202.  Management since that visit includes; taking rosuvastatin.  He reports good compliance with treatment. He is not having side effects. none  He is following a Regular, Low Sodium diet. Current exercise: walking  Last metabolic panel Lab Results  Component Value Date   GLUCOSE 156 (H) 04/02/2021   NA 139 04/02/2021   K 4.2 04/02/2021   BUN 24 (H) 04/02/2021   CREATININE 1.57 (H) 04/02/2021   GFRNONAA 45 (L) 04/02/2021   CALCIUM 9.6 04/02/2021   AST 32 04/02/2021   ALT 26 04/02/2021   The ASCVD Risk score (Arnett DK, et al., 2019) failed to calculate for the following reasons:   The valid total cholesterol range is 130 to 320 mg/dL  ---------------------------------------------------------------------------------------------------   Medications: Outpatient Medications Prior to Visit  Medication Sig   aspirin 81 MG tablet Take 81 mg by mouth daily.    cetirizine (ZYRTEC) 10 MG tablet TAKE 1 TABLET BY MOUTH EVERY DAY   Cholecalciferol (VITAMIN D3) 2000 UNITS capsule Take 2,000 Units by mouth daily.    Continuous Blood Gluc Sensor (FREESTYLE LIBRE 2 SENSOR)  MISC USE AS DIRECTED. CONTINUOUS BLOOD SENSOR.   cyproheptadine (PERIACTIN) 4 MG tablet Take one tablet 3 hours prior to intercourse   hydrochlorothiazide (HYDRODIURIL) 25 MG tablet TAKE 1 TABLET BY MOUTH EVERY DAY   ketoconazole (NIZORAL) 2 % cream Apply to feet and between toes QHS.   metFORMIN (GLUCOPHAGE) 1000 MG tablet TAKE 1 TABLET  (1,000 MG TOTAL) BY MOUTH 2 (TWO) TIMES DAILY WITH A MEAL.   MULTIPLE VITAMIN PO Take by mouth daily.    pantoprazole (PROTONIX) 40 MG tablet Take 40 mg by mouth daily.   pioglitazone (ACTOS) 15 MG tablet TAKE 1 TABLET BY MOUTH EVERY DAY   rOPINIRole (REQUIP) 0.25 MG tablet Take by mouth daily.    rosuvastatin (CRESTOR) 20 MG tablet TAKE 1 TABLET BY MOUTH EVERY DAY   Saw Palmetto, Serenoa repens, (SAW PALMETTO BERRY) 160 MG CAPS Take 2 tablets by mouth daily.    Semaglutide,0.25 or 0.5MG /DOS, (OZEMPIC, 0.25 OR 0.5 MG/DOSE,) 2 MG/1.5ML SOPN Inject 0.5 mg into the skin once a week.   tadalafil (CIALIS) 20 MG tablet TAKE 0.5-1 TABLET BY MOUTH EVERY OTHER DAY AS NEEDED FOR ERECTILE DYSFUNCTION   tadalafil (CIALIS) 5 MG tablet Take 1 tablet (5 mg total) by mouth daily.   telmisartan (MICARDIS) 40 MG tablet TAKE 1 TABLET BY MOUTH EVERY DAY   tiZANidine (ZANAFLEX) 4 MG tablet TAKE 1 TABLET BY MOUTH EVERYDAY AT BEDTIME   traMADol (ULTRAM) 50 MG tablet TAKE 1 TABLET BY MOUTH 3 TIMES A DAY AS NEEDED FOR BACK OR LEG PAIN   valACYclovir (VALTREX) 1000 MG tablet Take 1 tablet (1,000 mg total) by mouth 3 (three) times daily.   No facility-administered medications prior to visit.    Review of Systems  Constitutional:  Negative for appetite change, chills and fever.  Respiratory:  Negative for chest tightness, shortness of breath and wheezing.   Cardiovascular:  Negative for chest pain and palpitations.  Gastrointestinal:  Negative for abdominal pain, nausea and vomiting.   Last hemoglobin A1c Lab Results  Component Value Date   HGBA1C 6.3 (H) 07/27/2021       Objective    BP 131/75 (BP Location: Right Arm, Patient Position: Sitting, Cuff Size: Large)    Pulse 85    Temp 98.5 F (36.9 C) (Temporal)    Resp 16    Ht 6\' 1"  (1.854 m)    Wt 224 lb (101.6 kg)    SpO2 95%    BMI 29.55 kg/m  BP Readings from Last 3 Encounters:  07/27/21 131/75  07/18/21 130/74  04/19/21 129/67   Wt Readings from  Last 3 Encounters:  07/27/21 224 lb (101.6 kg)  07/18/21 218 lb (98.9 kg)  04/19/21 228 lb (103.4 kg)      Physical Exam Vitals reviewed.  Constitutional:      Appearance: Normal appearance. He is not diaphoretic.  HENT:     Head: Normocephalic and atraumatic.     Right Ear: External ear normal.     Left Ear: External ear normal.  Eyes:     General: No scleral icterus.    Conjunctiva/sclera: Conjunctivae normal.  Neck:     Vascular: No carotid bruit.  Cardiovascular:     Rate and Rhythm: Normal rate and regular rhythm.     Pulses: Normal pulses.     Heart sounds: Normal heart sounds.  Pulmonary:     Effort: Pulmonary effort is normal.     Breath sounds: Normal breath sounds.  Abdominal:  Palpations: Abdomen is soft.  Lymphadenopathy:     Cervical: No cervical adenopathy.  Skin:    General: Skin is warm and dry.  Neurological:     General: No focal deficit present.     Mental Status: He is alert and oriented to person, place, and time.  Psychiatric:        Mood and Affect: Mood normal.        Behavior: Behavior normal.        Thought Content: Thought content normal.        Judgment: Judgment normal.      No results found for any visits on 07/27/21.  Assessment & Plan     1. Type 2 diabetes mellitus with other specified complication, without long-term current use of insulin (HCC) Recent excellent control and now with good weight loss that should be even better controlled. - Hemoglobin A1c - Renal function panel  2. Benign essential HTN Good control, especially with recent weight loss. - Hemoglobin A1c - Renal function panel  3. Other hyperlipidemia On statin - Hemoglobin A1c - Renal function panel  4. Osteoarthritis of shoulder, unspecified laterality, unspecified osteoarthritis type Planning for reverse shoulder replacement - Hemoglobin A1c - Renal function panel  5. Kidney stones Urology.,  Doing well - Hemoglobin A1c - Renal function  panel    Return in about 18 weeks (around 11/30/2021).      I, [name], MD, have reviewed all documentation for this visit. The documentation on 07/28/21 for the exam, diagnosis, procedures, and orders are all accurate and complete.    Derwood Becraft Cranford Mon, MD  [facility] [phone] (phone) [phone] (fax)  [place]

## 2021-07-28 LAB — RENAL FUNCTION PANEL
Albumin: 4.4 g/dL (ref 3.7–4.7)
BUN/Creatinine Ratio: 13 (ref 10–24)
BUN: 19 mg/dL (ref 8–27)
CO2: 24 mmol/L (ref 20–29)
Calcium: 9.7 mg/dL (ref 8.6–10.2)
Chloride: 102 mmol/L (ref 96–106)
Creatinine, Ser: 1.48 mg/dL — ABNORMAL HIGH (ref 0.76–1.27)
Glucose: 133 mg/dL — ABNORMAL HIGH (ref 70–99)
Phosphorus: 3.2 mg/dL (ref 2.8–4.1)
Potassium: 4.4 mmol/L (ref 3.5–5.2)
Sodium: 140 mmol/L (ref 134–144)
eGFR: 49 mL/min/{1.73_m2} — ABNORMAL LOW (ref >59)

## 2021-07-28 LAB — HEMOGLOBIN A1C
Est. average glucose Bld gHb Est-mCnc: 134 mg/dL
Hgb A1c MFr Bld: 6.3 % — ABNORMAL HIGH (ref 4.8–5.6)

## 2021-08-02 ENCOUNTER — Ambulatory Visit: Payer: HMO | Admitting: Urology

## 2021-08-03 NOTE — Progress Notes (Incomplete)
08/03/21 12:41 PM   Manuel Stafford 02-10-1946 [id number]  Referring provider:  [name], MD [address] [place],  [place] [address] No chief complaint on file.   Urological history  1. Nephrolithiasis -stone composition 65% calcium oxalate, 5% calcium phosphate, 30% uric acid -URS 2017 - KUB on 07/18/2021 visualized probable 3 mm left kidney stone.    2. ED -contributing factors of age, HTN, sleep apnea, diabetes, HLD and obesity -manage with tadalafil 20 mg, on-demand-dosing   HPI: Manuel Stafford. is a 76 y.o.male      PMH: Past Medical History:  Diagnosis Date   Back pain    lower   Diabetes mellitus without complication ([place])    Patient takes Metformin.   Elevated lipids    GERD (gastroesophageal reflux disease)    Hemorrhoids 2017   Hyperlipidemia    Hypertension    Kidney stone 2017   Parkinsonian features     Surgical History: Past Surgical History:  Procedure Laterality Date   CIRCUMCISION     COLONOSCOPY  2007   COLONOSCOPY WITH PROPOFOL N/A 08/22/2016   Procedure: COLONOSCOPY WITH PROPOFOL;  Surgeon: Christene Lye, MD;  Location: [facility];  Service: Endoscopy;  Laterality: N/A;   CYSTOSCOPY WITH STENT PLACEMENT Right 09/18/2015   Procedure: CYSTOSCOPY WITH STENT PLACEMENT;  Surgeon: Hollice Espy, MD;  Location: [facility];  Service: Urology;  Laterality: Right;   CYSTOSCOPY WITH URETEROSCOPY, STONE BASKETRY AND STENT PLACEMENT Right 10/03/2015   Procedure: CYSTOSCOPY WITH URETEROSCOPY, STONE BASKETRY AND STENT PLACEMENT;  Surgeon: Hollice Espy, MD;  Location: [facility];  Service: Urology;  Laterality: Right;   EXTRACORPOREAL SHOCK WAVE LITHOTRIPSY Right 09/15/2015   Procedure: EXTRACORPOREAL SHOCK WAVE LITHOTRIPSY (ESWL);  Surgeon: Nickie Retort, MD;  Location: [facility];  Service: Urology;  Laterality: Right;   HERNIA REPAIR     ROTATOR CUFF REPAIR Right 06/2012    Home Medications:  Allergies  as of 08/07/2021   No Known Allergies      Medication List        Accurate as of August 03, 2021 12:41 PM. If you have any questions, ask your nurse or doctor.          aspirin 81 MG tablet Take 81 mg by mouth daily.   cetirizine 10 MG tablet Commonly known as: ZYRTEC TAKE 1 TABLET BY MOUTH EVERY DAY   cyproheptadine 4 MG tablet Commonly known as: PERIACTIN Take one tablet 3 hours prior to intercourse   FreeStyle Libre 2 Sensor Misc USE AS DIRECTED. CONTINUOUS BLOOD SENSOR.   hydrochlorothiazide 25 MG tablet Commonly known as: HYDRODIURIL TAKE 1 TABLET BY MOUTH EVERY DAY   ketoconazole 2 % cream Commonly known as: NIZORAL Apply to feet and between toes QHS.   metFORMIN 1000 MG tablet Commonly known as: GLUCOPHAGE TAKE 1 TABLET (1,000 MG TOTAL) BY MOUTH 2 (TWO) TIMES DAILY WITH A MEAL.   MULTIPLE VITAMIN PO Take by mouth daily.   Ozempic (0.25 or 0.5 MG/DOSE) 2 MG/1.5ML Sopn Generic drug: Semaglutide(0.25 or 0.5MG/DOS) Inject 0.5 mg into the skin once a week.   pantoprazole 40 MG tablet Commonly known as: PROTONIX Take 40 mg by mouth daily.   pioglitazone 15 MG tablet Commonly known as: ACTOS TAKE 1 TABLET BY MOUTH EVERY DAY   rOPINIRole 0.25 MG tablet Commonly known as: REQUIP Take by mouth daily.   rosuvastatin 20 MG tablet Commonly known as: CRESTOR TAKE 1 TABLET BY MOUTH EVERY DAY  Saw Palmetto Berry 160 MG Caps Take 2 tablets by mouth daily.   tadalafil 20 MG tablet Commonly known as: CIALIS TAKE 0.5-1 TABLET BY MOUTH EVERY OTHER DAY AS NEEDED FOR ERECTILE DYSFUNCTION   tadalafil 5 MG tablet Commonly known as: CIALIS Take 1 tablet (5 mg total) by mouth daily.   telmisartan 40 MG tablet Commonly known as: MICARDIS TAKE 1 TABLET BY MOUTH EVERY DAY   tiZANidine 4 MG tablet Commonly known as: ZANAFLEX TAKE 1 TABLET BY MOUTH EVERYDAY AT BEDTIME   traMADol 50 MG tablet Commonly known as: ULTRAM TAKE 1 TABLET BY MOUTH 3 TIMES A DAY AS  NEEDED FOR BACK OR LEG PAIN   valACYclovir 1000 MG tablet Commonly known as: VALTREX Take 1 tablet (1,000 mg total) by mouth 3 (three) times daily.   Vitamin D3 50 MCG (2000 UT) capsule Take 2,000 Units by mouth daily.        Allergies:  No Known Allergies  Family History: Family History  Problem Relation Age of Onset   Hypertension Mother    Diabetes Father    CAD Father    Diabetes Sister    Heart disease Sister    CAD Sister     Social History:  reports that he quit smoking about 47 years ago. His smoking use included cigarettes. He has a 30.00 pack-year smoking history. He has never used smokeless tobacco. He reports that he does not drink alcohol and does not use drugs.   Physical Exam: There were no vitals taken for this visit.  Constitutional:  Alert and oriented, No acute distress. HEENT: [place] AT, moist mucus membranes.  Trachea midline, no masses. Cardiovascular: No clubbing, cyanosis, or edema. Respiratory: Normal respiratory effort, no increased work of breathing. GI: Abdomen is soft, nontender, nondistended, no abdominal masses GU: No CVA tenderness Lymph: No cervical or inguinal lymphadenopathy. Skin: No rashes, bruises or suspicious lesions. Neurologic: Grossly intact, no focal deficits, moving all 4 extremities. Psychiatric: Normal mood and affect.  Laboratory Data: Lab Results  Component Value Date   CREATININE 1.48 (H) 07/27/2021   Lab Results  Component Value Date   HGBA1C 6.3 (H) 07/27/2021   Component     Latest Ref Rng & Units 07/27/2021  Glucose     70 - 99 mg/dL 133 (H)  BUN     8 - 27 mg/dL 19  Creatinine     0.76 - 1.27 mg/dL 1.48 (H)  eGFR     >59 mL/min/1.73 49 (L)  BUN/Creatinine Ratio     10 - 24 13  Sodium     134 - 144 mmol/L 140  Potassium     3.5 - 5.2 mmol/L 4.4  Chloride     96 - 106 mmol/L 102  CO2     20 - 29 mmol/L 24  Calcium     8.6 - 10.2 mg/dL 9.7  Phosphorus     2.8 - 4.1 mg/dL 3.2  Albumin     3.7 -  4.7 g/dL 4.4   Component     Latest Ref Rng & Units 07/27/2021  Hemoglobin A1C     4.8 - 5.6 % 6.3 (H)  Est. average glucose Bld gHb Est-mCnc     mg/dL 134   Component     Latest Ref Rng & Units 04/02/2021        11:00 AM  WBC     4.0 - 10.5 K/uL 8.8  RBC     0 - 2 /hpf 4.10 (  L)  Hemoglobin     13.0 - 17.0 g/dL 13.9  HCT     39.0 - 52.0 % 40.4  MCV     80.0 - 100.0 fL 98.5  MCH     26.0 - 34.0 pg 33.9  MCHC     30.0 - 36.0 g/dL 34.4  RDW     11.5 - 15.5 % 12.4  Platelets     150 - 400 K/uL 145 (L)  nRBC     0.0 - 0.2 % 0.0  Neutrophils     % 67  NEUT#     1.7 - 7.7 K/uL 5.9  Lymphocytes     % 26  Lymphocyte #     0.7 - 4.0 K/uL 2.3  Monocytes Relative     % 6  Monocyte #     0.1 - 1.0 K/uL 0.6  Eosinophil     % 1  Eosinophils Absolute     0.0 - 0.5 K/uL 0.1  Basophil     % 0  Basophils Absolute     0.0 - 0.1 K/uL 0.0  Immature Granulocytes     % 0  Abs Immature Granulocytes     0.00 - 0.07 K/uL 0.02  Lymphs     Not Estab. %   Monocytes     Not Estab. %   Eos     Not Estab. %   Basos     Not Estab. %   Monocytes Absolute     0.1 - 0.9 x10E3/uL   EOS (ABSOLUTE)     0.0 - 0.4 x10E3/uL   Immature Grans (Abs)     0.0 - 0.1 x10E3/uL   WBC, UA     0 - 5 /hpf   Epithelial Cells (non renal)     0 - 10 /hpf   Bacteria, UA     None seen/Few      Urinalysis   Pertinent Imaging: CLINICAL DATA:  Kidney stones   EXAM: ABDOMEN - 1 VIEW   COMPARISON:  09/26/2015, CT 04/02/2021   FINDINGS: Nonobstructed bowel gas pattern. Punctate 3 mm calcification overlying left kidney.   IMPRESSION: Probable 3 mm left kidney stone.     Electronically Signed   By: Donavan Foil M.D.   On: 07/19/2021 20:34    Assessment & Plan:     No follow-ups on file.  [facility] [address] [place], [place] [address] [phone]  I,Kailey Littlejohn,acting as a scribe for [facility], PA-C.,have documented  all relevant documentation on the behalf of SHANNON MCGOWAN, PA-C,as directed by  [facility], PA-C while in the presence of [place], PA-C.

## 2021-08-07 ENCOUNTER — Telehealth: Payer: HMO | Admitting: Family Medicine

## 2021-08-07 ENCOUNTER — Other Ambulatory Visit: Payer: Self-pay

## 2021-08-07 ENCOUNTER — Ambulatory Visit: Payer: HMO | Admitting: Urology

## 2021-08-07 ENCOUNTER — Ambulatory Visit: Payer: Self-pay | Admitting: *Deleted

## 2021-08-07 ENCOUNTER — Ambulatory Visit (INDEPENDENT_AMBULATORY_CARE_PROVIDER_SITE_OTHER): Payer: HMO | Admitting: Family Medicine

## 2021-08-07 DIAGNOSIS — U071 COVID-19: Secondary | ICD-10-CM

## 2021-08-07 NOTE — Telephone Encounter (Signed)
Summary: COVID positive   Patient called in and stated that starting that last night he started with coughing, stuffy nose and sore throat took a Covid test this morning that was positive and need to know what to do. Asking for a nurse to call him back at Ph# [phone]        Chief Complaint: positive covid at home test today requesting medication  Symptoms: cough, sore throat, fever at times, stuffy nose, pain in chest with cough, headache  Frequency: sx started last night 08/06/21 Pertinent Negatives: Patient denies difficulty breathing , fever at this time.  Disposition: [] ED /[] Urgent Care (no appt availability in office) / [x] Appointment(In office/virtual)/ []  [place] [facility]/ [] Home Care/ [] Refused Recommended Disposition /[] [place] [facility]/ []  Follow-up with PCP Additional Notes:  My Chart VV scheduled for today     Reason for Disposition  [1] HIGH RISK for severe COVID complications (e.g., weak immune system, age > 63 years, obesity with BMI > 25, pregnant, chronic lung disease or other chronic medical condition) AND [2] COVID symptoms (e.g., cough, fever)  (Exceptions: Already seen by PCP and no new or worsening symptoms.)  Answer Assessment - Initial Assessment Questions 1. COVID-19 DIAGNOSIS: "Who made your COVID-19 diagnosis?" "Was it confirmed by a positive lab test or self-test?" If not diagnosed by a doctor (or NP/PA), ask "Are there lots of cases (community spread) where you live?" Note: See public health department website, if unsure.     Covid at home test positive today  2. COVID-19 EXPOSURE: "Was there any known exposure to COVID before the symptoms began?" CDC Definition of close contact: within 6 feet (2 meters) for a total of 15 minutes or more over a 24-hour period.      Possible  3. ONSET: "When did the COVID-19 symptoms start?"      Last 08/06/21 4. WORST SYMPTOM: "What is your worst symptom?" (e.g., cough, fever, shortness of breath, muscle  aches)     Headache  5. COUGH: "Do you have a cough?" If Yes, ask: "How bad is the cough?"       Dry cough with pain in chest  6. FEVER: "Do you have a fever?" If Yes, ask: "What is your temperature, how was it measured, and when did it start?"     Was 101 now 98.7 7. RESPIRATORY STATUS: "Describe your breathing?" (e.g., shortness of breath, wheezing, unable to speak)      No  8. BETTER-SAME-WORSE: "Are you getting better, staying the same or getting worse compared to yesterday?"  If getting worse, ask, "In what way?"     Getting worse  9. HIGH RISK DISEASE: "Do you have any chronic medical problems?" (e.g., asthma, heart or lung disease, weak immune system, obesity, etc.)     See hx HTN , diabetes  10. VACCINE: "Have you had the COVID-19 vaccine?" If Yes, ask: "Which one, how many shots, when did you get it?"       Yes x 2  11. BOOSTER: "Have you received your COVID-19 booster?" If Yes, ask: "Which one and when did you get it?"       Yes x 2  12. PREGNANCY: "Is there any chance you are pregnant?" "When was your last menstrual period?"       na 13. OTHER SYMPTOMS: "Do you have any other symptoms?"  (e.g., chills, fatigue, headache, loss of smell or taste, muscle pain, sore throat)       Headache , no taste ,  sore throat cough, stuffy nose  14. O2 SATURATION MONITOR:  "Do you use an oxygen saturation monitor (pulse oximeter) at home?" If Yes, ask "What is your reading (oxygen level) today?" "What is your usual oxygen saturation reading?" (e.g., 95%)       na  Protocols used: Coronavirus (COVID-19) Diagnosed or Suspected-A-AH

## 2021-08-09 ENCOUNTER — Encounter: Payer: Self-pay | Admitting: Family Medicine

## 2021-08-10 NOTE — Progress Notes (Signed)
Patient with mild URI symptoms positive for COVID x1 day.  Nonproductive cough and congestion with no shortness of breath. Not use antivirals at this time after discussion with the patient. Treat with Robitussin twice a day and vitamin D vitamin C and zinc daily

## 2021-08-11 ENCOUNTER — Ambulatory Visit: Payer: HMO | Admitting: Family Medicine

## 2021-08-14 ENCOUNTER — Other Ambulatory Visit: Payer: Self-pay | Admitting: Urology

## 2021-08-14 DIAGNOSIS — F5232 Male orgasmic disorder: Secondary | ICD-10-CM

## 2021-08-15 ENCOUNTER — Telehealth: Payer: Self-pay

## 2021-08-15 ENCOUNTER — Ambulatory Visit: Payer: Self-pay

## 2021-08-15 ENCOUNTER — Other Ambulatory Visit: Payer: Self-pay | Admitting: *Deleted

## 2021-08-15 DIAGNOSIS — R519 Headache, unspecified: Secondary | ICD-10-CM

## 2021-08-15 MED ORDER — PREDNISONE 20 MG PO TABS: 20.0000 mg | ORAL_TABLET | Freq: Every day | ORAL | 0 refills | Status: DC

## 2021-08-15 NOTE — Telephone Encounter (Signed)
° °  Chief Complaint: Had COVID 19 last week. Still weak, not feeling well, no energy. Symptoms: Still has sinus drainage and "very weak." Frequency: Started last week Pertinent Negatives: Patient denies fever Disposition: [] ED /[] Urgent Care (no appt availability in office) / [] Appointment(In office/virtual)/ []  [place] [facility]/ [] Home Care/ [] Refused Recommended Disposition /[] [facility]/ [x]  Follow-up with PCP Additional Notes: Asking for recommendations from PCP. Please advise pt.   Answer Assessment - Initial Assessment Questions 1. LOCATION: "Where does it hurt?"      Drainage 2. ONSET: "When did the sinus pain start?"  (e.g., hours, days)      Last week 3. SEVERITY: "How bad is the pain?"   (Scale 1-10; mild, moderate or severe)   - MILD (1-3): doesn't interfere with normal activities    - MODERATE (4-7): interferes with normal activities (e.g., work or school) or awakens from sleep   - SEVERE (8-10): excruciating pain and patient unable to do any normal activities        Mild 4. RECURRENT SYMPTOM: "Have you ever had sinus problems before?" If Yes, ask: "When was the last time?" and "What happened that time?"      Yes 5. NASAL CONGESTION: "Is the nose blocked?" If Yes, ask: "Can you open it or must you breathe through your mouth?"     Draining 6. NASAL DISCHARGE: "Do you have discharge from your nose?" If so ask, "What color?"     Clear 7. FEVER: "Do you have a fever?" If Yes, ask: "What is it, how was it measured, and when did it start?"      No 8. OTHER SYMPTOMS: "Do you have any other symptoms?" (e.g., sore throat, cough, earache, difficulty breathing)     Sinus drainage, very weak 9. PREGNANCY: "Is there any chance you are pregnant?" "When was your last menstrual period?"     N/a  Protocols used: Sinus Pain or Congestion-A-AH

## 2021-08-15 NOTE — Telephone Encounter (Signed)
Copied from [place] [id number]. Topic: General - Inquiry >> Aug 15, 2021  2:32 PM Scherrie Gerlach wrote: Reason for CRM: pt called during lunch and wanted to speak with Sharyn Lull.   Advised office at lunch, he got upset. Annice Pih who I se he spoke with. She was at lunch. He said we do not know what one hand is doing with the other, he wanted to speak with Sharyn Lull who he had just hung up with at 12:30 pm. He hung up on me.

## 2021-08-15 NOTE — Telephone Encounter (Signed)
Patient wanted to try the prednisone. Rx was sent to pharmacy.

## 2021-08-15 NOTE — Telephone Encounter (Signed)
Returned call to patient. Patient stated he will call back in the morning to let us know if he will been seen at 3:20 pm.

## 2021-08-16 NOTE — Telephone Encounter (Signed)
Pt called in to let PCP know he will not make it in at 3:20, pt states he is feeling much better, please advise.

## 2021-08-23 ENCOUNTER — Ambulatory Visit: Payer: HMO | Admitting: Urology

## 2021-08-23 ENCOUNTER — Ambulatory Visit
Admission: RE | Admit: 2021-08-23 | Discharge: 2021-08-23 | Disposition: A | Discharge status: Home / Self Care | Payer: HMO | Source: Ambulatory Visit | Attending: Urology | Admitting: Urology

## 2021-08-23 ENCOUNTER — Other Ambulatory Visit: Payer: Self-pay

## 2021-08-23 VITALS — BP 143/74 | HR 90 | Ht 72.0 in | Wt 220.0 lb

## 2021-08-23 DIAGNOSIS — N528 Other male erectile dysfunction: Secondary | ICD-10-CM | POA: Diagnosis not present

## 2021-08-23 DIAGNOSIS — N200 Calculus of kidney: Secondary | ICD-10-CM | POA: Diagnosis not present

## 2021-08-23 DIAGNOSIS — F5232 Male orgasmic disorder: Secondary | ICD-10-CM | POA: Diagnosis not present

## 2021-08-23 LAB — URINALYSIS, COMPLETE
Bilirubin, UA: NEGATIVE
Glucose, UA: NEGATIVE
Ketones, UA: NEGATIVE
Leukocytes,UA: NEGATIVE
Nitrite, UA: NEGATIVE
Protein,UA: NEGATIVE
RBC, UA: NEGATIVE
Specific Gravity, UA: 1.025 (ref 1.005–1.030)
Urobilinogen, Ur: 0.2 mg/dL (ref 0.2–1.0)
pH, UA: 6 (ref 5.0–7.5)

## 2021-08-23 LAB — MICROSCOPIC EXAMINATION
Bacteria, UA: NONE SEEN
Epithelial Cells (non renal): NONE SEEN /hpf (ref 0–10)
RBC, Urine: NONE SEEN /hpf (ref 0–2)
WBC, UA: NONE SEEN /hpf (ref 0–5)

## 2021-08-23 MED ORDER — TADALAFIL 5 MG PO TABS: 5.0000 mg | ORAL_TABLET | Freq: Every day | ORAL | 3 refills | Status: DC

## 2021-08-23 NOTE — Progress Notes (Signed)
? ?08/23/21 ?10:06 AM  ? ?Manuel Stafford. ?[birth year] ?[id number] ? ?Referring provider:  ?[name], MD ?[facility] ?[address] ?[place],  [place] [address] ?Chief Complaint  ?Patient presents with  ? Nephrolithiasis  ? ? ? ?HPI: ?Manuel Stafford. is a 76 y.o.male  with a personal history of nephrolithiasis and ED, who presents today for possible kidney stones.  ? ?He is s/p ureteroscopy in 2017.  ? ?KUB on 07/18/2021 visualized a probable 3 mm left kidney stone.  ? ?He reports that he has been having left back pain that radiates into the left side off-and-on for the last several months.  The pain is worse with physical activity improves with stretching.  He reports that Zara Council, PA-C, gave him Flomax to help him pass the stone.    ? ?He is also having some issues with erectile dysfunction and ejaculatory disorders.  He states he is able to achieve a somewhat satisfactory erection for intercourse with PDE 5 inhibitors. He reports that his orgasms are the same but there is no semen. ? ?PMH: ?Past Medical History:  ?Diagnosis Date  ? Back pain   ? lower  ? Diabetes mellitus without complication ([place])   ? Patient takes Metformin.  ? Elevated lipids   ? GERD (gastroesophageal reflux disease)   ? Hemorrhoids 2017  ? Hyperlipidemia   ? Hypertension   ? Kidney stone 2017  ? Parkinsonian features   ? ? ?Surgical History: ?Past Surgical History:  ?Procedure Laterality Date  ? CIRCUMCISION    ? COLONOSCOPY  2007  ? COLONOSCOPY WITH PROPOFOL N/A 08/22/2016  ? Procedure: COLONOSCOPY WITH PROPOFOL;  Surgeon: Christene Lye, MD;  Location: [facility];  Service: Endoscopy;  Laterality: N/A;  ? CYSTOSCOPY WITH STENT PLACEMENT Right 09/18/2015  ? Procedure: CYSTOSCOPY WITH STENT PLACEMENT;  Surgeon: Hollice Espy, MD;  Location: [facility];  Service: Urology;  Laterality: Right;  ? CYSTOSCOPY WITH URETEROSCOPY, STONE BASKETRY AND STENT PLACEMENT Right 10/03/2015  ? Procedure: CYSTOSCOPY WITH  URETEROSCOPY, STONE BASKETRY AND STENT PLACEMENT;  Surgeon: Hollice Espy, MD;  Location: [facility];  Service: Urology;  Laterality: Right;  ? EXTRACORPOREAL SHOCK WAVE LITHOTRIPSY Right 09/15/2015  ? Procedure: EXTRACORPOREAL SHOCK WAVE LITHOTRIPSY (ESWL);  Surgeon: Nickie Retort, MD;  Location: [facility];  Service: Urology;  Laterality: Right;  ? HERNIA REPAIR    ? ROTATOR CUFF REPAIR Right 06/2012  ? ? ?Home Medications:  ?Allergies as of 08/23/2021   ?No Known Allergies ?  ? ?  ?Medication List  ?  ? ?  ? Accurate as of August 23, 2021 10:06 AM. If you have any questions, ask your nurse or doctor.  ?  ?  ? ?  ? ?STOP taking these medications   ? ?ketoconazole 2 % cream ?Commonly known as: NIZORAL ?Stopped by: Hollice Espy, MD ?  ?predniSONE 20 MG tablet ?Commonly known as: DELTASONE ?Stopped by: Hollice Espy, MD ?  ? ?  ? ?TAKE these medications   ? ?aspirin 81 MG tablet ?Take 81 mg by mouth daily. ?  ?cetirizine 10 MG tablet ?Commonly known as: ZYRTEC ?TAKE 1 TABLET BY MOUTH EVERY DAY ?  ?cyproheptadine 4 MG tablet ?Commonly known as: PERIACTIN ?Take one tablet 3 hours prior to intercourse ?  ?FreeStyle Libre 2 Sensor Misc ?USE AS DIRECTED. CONTINUOUS BLOOD SENSOR. ?  ?hydrochlorothiazide 25 MG tablet ?Commonly known as: HYDRODIURIL ?TAKE 1 TABLET BY MOUTH EVERY DAY ?  ?metFORMIN 1000 MG tablet ?Commonly  known as: GLUCOPHAGE ?TAKE 1 TABLET (1,000 MG TOTAL) BY MOUTH 2 (TWO) TIMES DAILY WITH A MEAL. ?  ?MULTIPLE VITAMIN PO ?Take by mouth daily. ?  ?Ozempic (0.25 or 0.5 MG/DOSE) 2 MG/1.5ML Sopn ?Generic drug: Semaglutide(0.25 or 0.5MG /DOS) ?Inject 0.5 mg into the skin once a week. ?  ?pantoprazole 40 MG tablet ?Commonly known as: PROTONIX ?Take 40 mg by mouth daily. ?  ?pioglitazone 15 MG tablet ?Commonly known as: ACTOS ?TAKE 1 TABLET BY MOUTH EVERY DAY ?  ?rOPINIRole 0.25 MG tablet ?Commonly known as: REQUIP ?Take by mouth daily. ?  ?rosuvastatin 20 MG tablet ?Commonly known as: CRESTOR ?TAKE 1 TABLET BY MOUTH  EVERY DAY ?  ?Saw Palmetto Berry 160 MG Caps ?Take 2 tablets by mouth daily. ?  ?sildenafil 100 MG tablet ?Commonly known as: VIAGRA ?Take 100 mg by mouth daily as needed for erectile dysfunction. ?  ?tadalafil 20 MG tablet ?Commonly known as: CIALIS ?TAKE 0.5-1 TABLET BY MOUTH EVERY OTHER DAY AS NEEDED FOR ERECTILE DYSFUNCTION ?  ?tadalafil 5 MG tablet ?Commonly known as: CIALIS ?Take 1 tablet (5 mg total) by mouth daily. ?  ?telmisartan 40 MG tablet ?Commonly known as: MICARDIS ?TAKE 1 TABLET BY MOUTH EVERY DAY ?  ?tiZANidine 4 MG tablet ?Commonly known as: ZANAFLEX ?TAKE 1 TABLET BY MOUTH EVERYDAY AT BEDTIME ?  ?traMADol 50 MG tablet ?Commonly known as: ULTRAM ?TAKE 1 TABLET BY MOUTH 3 TIMES A DAY AS NEEDED FOR BACK OR LEG PAIN ?  ?valACYclovir 1000 MG tablet ?Commonly known as: VALTREX ?Take 1 tablet (1,000 mg total) by mouth 3 (three) times daily. ?  ?Vitamin D3 50 MCG (2000 UT) capsule ?Take 2,000 Units by mouth daily. ?  ? ?  ? ? ?Allergies: No Known Allergies ? ?Family History: ?Family History  ?Problem Relation Age of Onset  ? Hypertension Mother   ? Diabetes Father   ? CAD Father   ? Diabetes Sister   ? Heart disease Sister   ? CAD Sister   ? ? ?Social History:  reports that he quit smoking about 47 years ago. His smoking use included cigarettes. He has a 30.00 pack-year smoking history. He has never used smokeless tobacco. He reports that he does not drink alcohol and does not use drugs. ? ? ?Physical Exam: ?BP (!) 143/74   Pulse 90   Ht 6' (1.829 m)   Wt 220 lb (99.8 kg)   BMI 29.84 kg/m?   ?Constitutional:  Alert and oriented, No acute distress. ?HEENT: [place] AT, moist mucus membranes.  Trachea midline, no masses. ?Cardiovascular: No clubbing, cyanosis, or edema. ?Respiratory: Normal respiratory effort, no increased work of breathing. ?Skin: No rashes, bruises or suspicious lesions. ?Neurologic: Grossly intact, no focal deficits, moving all 4 extremities. ?Psychiatric: Normal mood and  affect. ? ?Laboratory Data: ?Lab Results  ?Component Value Date  ? CREATININE 1.48 (H) 07/27/2021  ? ?Lab Results  ?Component Value Date  ? PSA 0.3 10/14/2014  ? ?Lab Results  ?Component Value Date  ? HGBA1C 6.3 (H) 07/27/2021  ? ? ?Urinalysis ?- Unremarkable  ? ?Assessment & Plan:   ? ?1. History of Nephrolithiasis/ LLQ pan/ back pain ?- He was given the option of multiple imaging  ?-We did have a frank discussion no however based on the nature of his pain and overall stability of his stones in the past as well as negative urinalysis today, this seems less likely to be kidney stone related ?- Urinalysis was unremarkable  ?- KUB to further  evaluate scheduled; if it is unchanged recommend he follow-up with is PCP ?- if KUB has changed will further evaluate with CT renal stone study  ? ?2. ED ?- He has yet to take tadalafil and sildenafil as Zara Council, PA-C recommended.  ?-We discussed optimal ministration of these medications ? ?3.  Ejaculatory disorder ?- Discussed pathophysiology of this ? ? ? ?I,Kailey Littlejohn,acting as a scribe for Hollice Espy, MD.,have documented all relevant documentation on the behalf of Hollice Espy, MD,as directed by  Hollice Espy, MD while in the presence of Hollice Espy, MD. ? ?I have reviewed the above documentation for accuracy and completeness, and I agree with the above.  ? ?Hollice Espy, MD ? ? ?[facility] ?[address] ?[place], [place] [address] ?[phone] ?

## 2021-08-24 ENCOUNTER — Telehealth: Payer: Self-pay | Admitting: Urology

## 2021-08-24 NOTE — Telephone Encounter (Signed)
Patient called the office today requesting results of his xray from 08/23/2021. ? ?I advised him that the radiologist has not released the result of the xray, but we would call him as soon as they are available. ?

## 2021-08-25 ENCOUNTER — Telehealth: Payer: Self-pay | Admitting: *Deleted

## 2021-08-25 NOTE — Telephone Encounter (Addendum)
Patient informed, voiced understanding.  ? ?----- Message from Hollice Espy, MD sent at 08/25/2021  7:37 AM EST ----- ?Abdominal x-ray shows the same 3 mm left lower pole stone, unchanged.  I do not think that stones are the etiology of your pain.  I think investigating other causes may be helpful. ? ?Hollice Espy, MD ? ?

## 2021-08-28 ENCOUNTER — Ambulatory Visit: Payer: HMO | Admitting: Urology

## 2021-09-05 ENCOUNTER — Other Ambulatory Visit: Payer: Self-pay | Admitting: Urology

## 2021-09-05 ENCOUNTER — Other Ambulatory Visit: Payer: Self-pay | Admitting: Family Medicine

## 2021-09-05 DIAGNOSIS — F5232 Male orgasmic disorder: Secondary | ICD-10-CM

## 2021-09-05 DIAGNOSIS — M5136 Other intervertebral disc degeneration, lumbar region: Secondary | ICD-10-CM

## 2021-09-05 NOTE — Telephone Encounter (Signed)
Requested medication (s) are due for refill today: No, requesting too soon ? ?Requested medication (s) are on the active medication list: NO ? ?Last refill:  05/22/21 #90 with 1 RF ? ?Future visit scheduled: 11/30/21 ? ?Notes to clinic:  This medication can not be delegated, please assess.  ? ? ? ?  ? ?Requested Prescriptions  ?Pending Prescriptions Disp Refills  ? tiZANidine (ZANAFLEX) 4 MG tablet [Pharmacy Med Name: TIZANIDINE HCL 4 MG TABLET] 90 tablet 1  ?  Sig: TAKE 1 TABLET BY MOUTH EVERYDAY AT BEDTIME  ?  ? Not Delegated - Cardiovascular:  Alpha-2 Agonists - tizanidine Failed - 09/05/2021 10:54 AM  ?  ?  Failed - This refill cannot be delegated  ?  ?  Passed - Valid encounter within last 6 months  ?  Recent Outpatient Visits   ? ?      ? 4 weeks ago COVID-19  ? [facility] [name], MD  ? 1 month ago Type 2 diabetes mellitus with other specified complication, without long-term current use of insulin ([place])  ? [facility] [name], MD  ? 4 months ago Type 2 diabetes mellitus with other specified complication, without long-term current use of insulin ([place])  ? [facility] [name], MD  ? 6 months ago Type 2 diabetes mellitus with diabetic polyneuropathy, without long-term current use of insulin ([place])  ? [facility] [name] MD  ? 9 months ago Type 2 diabetes mellitus with diabetic polyneuropathy, without long-term current use of insulin ([place])  ? [facility] [name], MD  ? ?  ?  ?Future Appointments   ? ?        ? In 2 months [name], MD [facility]  ? In 11 months Hollice Espy, MD [place]  ? ?  ? ?  ?  ?  ?Refused Prescriptions Disp Refills  ? pioglitazone (ACTOS) 15 MG tablet [Pharmacy Med Name: PIOGLITAZONE HCL 15 MG TABLET] 90 tablet 0  ?  Sig: TAKE 1 TABLET BY MOUTH EVERY DAY  ?  ? Endocrinology:  Diabetes  - Glitazones - pioglitazone Passed - 09/05/2021 10:54 AM  ?  ?  Passed - HBA1C is between 0 and 7.9 and within 180 days  ?  Hgb A1c MFr Bld  ?Date Value Ref Range Status  ?07/27/2021 6.3 (H) 4.8 - 5.6 % Final  ?  Comment:  ?           Prediabetes: 5.7 - 6.4 ?         Diabetes: >6.4 ?         Glycemic control for adults with diabetes: <7.0 ?  ?  ?  ?  ?  Passed - Valid encounter within last 6 months  ?  Recent Outpatient Visits   ? ?      ? 4 weeks ago COVID-19  ? [facility] [name], MD  ? 1 month ago Type 2 diabetes mellitus with other specified complication, without long-term current use of insulin ([place])  ? [facility] [name], MD  ? 4 months ago Type 2 diabetes mellitus with other specified complication, without long-term current use of insulin ([place])  ? [name]  ? 6 months ago Type 2 diabetes mellitus with diabetic polyneuropathy, without long-term current use of  insulin ([place])  ? [facility] [name], MD  ? 9 months ago Type 2 diabetes mellitus with diabetic polyneuropathy, without long-term current use of insulin ([place])  ? [facility] [name], MD  ? ?  ?  ?Future Appointments   ? ?        ? In 2 months [name], MD [facility]  ? In 11 months Hollice Espy, MD [place]  ? ?  ? ?  ?  ?  ? rosuvastatin (CRESTOR) 20 MG tablet [Pharmacy Med Name: ROSUVASTATIN CALCIUM 20 MG TAB] 90 tablet 1  ?  Sig: TAKE 1 TABLET BY MOUTH EVERY DAY  ?  ? Cardiovascular:  Antilipid - Statins 2 Failed - 09/05/2021 10:54 AM  ?  ?  Failed - Cr in normal range and within 360 days  ?  Creatinine, Ser  ?Date Value Ref Range Status  ?07/27/2021 1.48 (H) 0.76 - 1.27 mg/dL Final  ?  ?  ?  ?  Failed - Lipid Panel in normal range within the last 12 months  ?  Cholesterol, Total  ?Date Value Ref Range Status  ?03/16/2020 121 100  - 199 mg/dL Final  ? ?LDL Chol Calc (NIH)  ?Date Value Ref Range Status  ?03/16/2020 55 0 - 99 mg/dL Final  ? ?HDL  ?Date Value Ref Range Status  ?03/16/2020 29 (L) >39 mg/dL Final  ? ?Triglycerides  ?Date Value Ref Range Status  ?03/16/2020 227 (H) 0 - 149 mg/dL Final  ? ?  ?  ?  Passed - Patient is not pregnant  ?  ?  Passed - Valid encounter within last 12 months  ?  Recent Outpatient Visits   ? ?      ? 4 weeks ago COVID-19  ? [facility] [name], MD  ? 1 month ago Type 2 diabetes mellitus with other specified complication, without long-term current use of insulin ([place])  ? [facility] [name], MD  ? 4 months ago Type 2 diabetes mellitus with other specified complication, without long-term current use of insulin ([place])  ? [facility] [name], MD  ? 6 months ago Type 2 diabetes mellitus with diabetic polyneuropathy, without long-term current use of insulin ([place])  ? [facility] [name], MD  ? 9 months ago Type 2 diabetes mellitus with diabetic polyneuropathy, without long-term current use of insulin ([place])  ? [facility] [name], MD  ? ?  ?  ?Future Appointments   ? ?        ? In 2 months [name], MD [facility]  ? In 11 months Hollice Espy, MD [place]  ? ?  ? ?  ?  ?  ? hydrochlorothiazide (HYDRODIURIL) 25 MG tablet [Pharmacy Med Name: HYDROCHLOROTHIAZIDE 25 MG TAB] 90 tablet 1  ?  Sig: TAKE 1 TABLET BY MOUTH EVERY DAY  ?  ? Cardiovascular: Diuretics - Thiazide Failed - 09/05/2021 10:54 AM  ?  ?  Failed - Cr in normal range and within 180 days  ?  Creatinine, Ser  ?Date Value Ref Range Status  ?07/27/2021 1.48 (H) 0.76 - 1.27 mg/dL Final  ?  ?  ?  ?  Failed - Last BP in normal range  ?  BP Readings from Last 1 Encounters:  ?08/23/21 [name])  143/74  ?  ?  ?  ?  Passed - K in normal range and within 180 days  ?   Potassium  ?Date Value Ref Range Status  ?07/27/2021 4.4 3.5 - 5.2 mmol/L Final  ?07/10/2012 4.3 3.5 - 5.1 mmol/L Final  ?  ?  ?  ?  Passed - Na in normal range and within 180 days  ?  Sodium  ?Date Value Ref Range Status  ?07/27/2021 140 134 - 144 mmol/L Final  ?  ?  ?  ?  Passed - Valid encounter within last 6 months  ?  Recent Outpatient Visits   ? ?      ? 4 weeks ago COVID-19  ? [facility] [name], MD  ? 1 month ago Type 2 diabetes mellitus with other specified complication, without long-term current use of insulin ([place])  ? [facility] [name], MD  ? 4 months ago Type 2 diabetes mellitus with other specified complication, without long-term current use of insulin ([place])  ? [facility] [name], MD  ? 6 months ago Type 2 diabetes mellitus with diabetic polyneuropathy, without long-term current use of insulin ([place])  ? [facility] [name], MD  ? 9 months ago Type 2 diabetes mellitus with diabetic polyneuropathy, without long-term current use of insulin ([place])  ? [facility] [name], MD  ? ?  ?  ?Future Appointments   ? ?        ? In 2 months [name], MD [facility]  ? In 11 months Hollice Espy, MD [place]  ? ?  ? ?  ?  ?  ? ? ?

## 2021-09-05 NOTE — Telephone Encounter (Signed)
Actos, Crestor, HCTZ all requested too early ?Requested Prescriptions  ?Pending Prescriptions Disp Refills  ?? pioglitazone (ACTOS) 15 MG tablet [Pharmacy Med Name: PIOGLITAZONE HCL 15 MG TABLET] 90 tablet 0  ?  Sig: TAKE 1 TABLET BY MOUTH EVERY DAY  ?  ? Endocrinology:  Diabetes - Glitazones - pioglitazone Passed - 09/05/2021 10:54 AM  ?  ?  Passed - HBA1C is between 0 and 7.9 and within 180 days  ?  Hgb A1c MFr Bld  ?Date Value Ref Range Status  ?07/27/2021 6.3 (H) 4.8 - 5.6 % Final  ?  Comment:  ?           Prediabetes: 5.7 - 6.4 ?         Diabetes: >6.4 ?         Glycemic control for adults with diabetes: <7.0 ?  ?   ?  ?  Passed - Valid encounter within last 6 months  ?  Recent Outpatient Visits   ?      ? 4 weeks ago COVID-19  ? [facility] [name] MD  ? 1 month ago Type 2 diabetes mellitus with other specified complication, without long-term current use of insulin ([place])  ? [facility] [name], MD  ? 4 months ago Type 2 diabetes mellitus with other specified complication, without long-term current use of insulin ([place])  ? [facility] [name], MD  ? 6 months ago Type 2 diabetes mellitus with diabetic polyneuropathy, without long-term current use of insulin ([place])  ? [facility] [name], MD  ? 9 months ago Type 2 diabetes mellitus with diabetic polyneuropathy, without long-term current use of insulin ([place])  ? [facility] [name], MD  ?  ?  ?Future Appointments   ?        ? In 2 months [name], MD [facility]  ? In 11 months Hollice Espy, MD [place]  ?  ? ?  ?  ?  ?? rosuvastatin (CRESTOR) 20 MG tablet [Pharmacy Med Name: ROSUVASTATIN CALCIUM 20 MG TAB] 90 tablet 1  ?  Sig: TAKE 1 TABLET BY MOUTH EVERY DAY  ?  ? Cardiovascular:  Antilipid - Statins 2 Failed - 09/05/2021 10:54 AM  ?  ?  Failed - Cr in normal  range and within 360 days  ?  Creatinine, Ser  ?Date Value Ref Range Status  ?07/27/2021 1.48 (H) 0.76 - 1.27 mg/dL Final  ?   ?  ?  Failed - Lipid Panel in normal range within the last 12 months  ?  Cholesterol, Total  ?Date Value Ref Range Status  ?03/16/2020 121 100 - 199 mg/dL Final  ? ?LDL Chol Calc (NIH)  ?Date Value Ref Range Status  ?03/16/2020 55 0 - 99 mg/dL Final  ? ?HDL  ?Date Value Ref Range Status  ?03/16/2020 29 (L) >39 mg/dL Final  ? ?Triglycerides  ?Date Value Ref Range Status  ?03/16/2020 227 (H) 0 - 149 mg/dL Final  ? ?  ?  ?  Passed - Patient is not pregnant  ?  ?  Passed - Valid encounter within last 12 months  ?  Recent Outpatient Visits   ?      ? 4 weeks ago COVID-19  ? [facility] [name], MD  ? 1 month ago Type 2 diabetes mellitus with other specified  complication, without long-term current use of insulin ([place])  ? [facility] [name], MD  ? 4 months ago Type 2 diabetes mellitus with other specified complication, without long-term current use of insulin ([place])  ? [facility] [name], MD  ? 6 months ago Type 2 diabetes mellitus with diabetic polyneuropathy, without long-term current use of insulin ([place])  ? [facility] [name], MD  ? 9 months ago Type 2 diabetes mellitus with diabetic polyneuropathy, without long-term current use of insulin ([place])  ? [facility] [name], MD  ?  ?  ?Future Appointments   ?        ? In 2 months [name], [facility]  ? In 11 months Hollice Espy, MD [place]  ?  ? ?  ?  ?  ?? hydrochlorothiazide (HYDRODIURIL) 25 MG tablet [Pharmacy Med Name: HYDROCHLOROTHIAZIDE 25 MG TAB] 90 tablet 1  ?  Sig: TAKE 1 TABLET BY MOUTH EVERY DAY  ?  ? Cardiovascular: Diuretics - Thiazide Failed - 09/05/2021 10:54 AM  ?  ?  Failed - Cr in normal range and within 180 days  ?   Creatinine, Ser  ?Date Value Ref Range Status  ?07/27/2021 1.48 (H) 0.76 - 1.27 mg/dL Final  ?   ?  ?  Failed - Last BP in normal range  ?  BP Readings from Last 1 Encounters:  ?08/23/21 (!) 143/74  ?   ?  ?  Passed - K in normal range and within 180 days  ?  Potassium  ?Date Value Ref Range Status  ?07/27/2021 4.4 3.5 - 5.2 mmol/L Final  ?07/10/2012 4.3 3.5 - 5.1 mmol/L Final  ?   ?  ?  Passed - Na in normal range and within 180 days  ?  Sodium  ?Date Value Ref Range Status  ?07/27/2021 140 134 - 144 mmol/L Final  ?   ?  ?  Passed - Valid encounter within last 6 months  ?  Recent Outpatient Visits   ?      ? 4 weeks ago COVID-19  ? [facility] [name] MD  ? 1 month ago Type 2 diabetes mellitus with other specified complication, without long-term current use of insulin ([place])  ? [facility] [name], MD  ? 4 months ago Type 2 diabetes mellitus with other specified complication, without long-term current use of insulin ([place])  ? [facility] [name], MD  ? 6 months ago Type 2 diabetes mellitus with diabetic polyneuropathy, without long-term current use of insulin ([place])  ? [facility] [name], MD  ? 9 months ago Type 2 diabetes mellitus with diabetic polyneuropathy, without long-term current use of insulin ([place])  ? [facility] [name], MD  ?  ?  ?Future Appointments   ?        ? In 2 months [name], MD [facility]  ? In 11 months Hollice Espy, MD [place]  ?  ? ?  ?  ?  ?? tiZANidine (ZANAFLEX) 4 MG tablet [Pharmacy Med Name: TIZANIDINE HCL 4 MG TABLET] 90 tablet 1  ?  Sig: TAKE 1 TABLET BY MOUTH EVERYDAY AT BEDTIME  ?  ? Not Delegated - Cardiovascular:  Alpha-2 Agonists - tizanidine Failed - 09/05/2021  10:54 AM  ?  ?  Failed - This refill cannot be delegated  ?  ?  Passed - Valid encounter within last 6 months  ?   Recent Outpatient Visits   ?      ? 4 weeks ago COVID-19  ? [facility] [name], MD  ? 1 month ago Type 2 diabetes mellitus with other specified complication, without long-term current use of insulin ([place])  ? [facility] [name], MD  ? 4 months ago Type 2 diabetes mellitus with other specified complication, without long-term current use of insulin ([place])  ? [facility] [name], MD  ? 6 months ago Type 2 diabetes mellitus with diabetic polyneuropathy, without long-term current use of insulin ([place])  ? [facility] [name], MD  ? 9 months ago Type 2 diabetes mellitus with diabetic polyneuropathy, without long-term current use of insulin ([place])  ? [facility] [name], MD  ?  ?  ?Future Appointments   ?        ? In 2 months [name], MD [facility]  ? In 11 months Hollice Espy, MD [place]  ?  ? ?  ?  ?  ? ? ?

## 2021-09-11 ENCOUNTER — Other Ambulatory Visit: Payer: Self-pay | Admitting: Family Medicine

## 2021-09-11 DIAGNOSIS — M5136 Other intervertebral disc degeneration, lumbar region: Secondary | ICD-10-CM

## 2021-09-11 NOTE — Telephone Encounter (Signed)
[facility] faxed refill request for the following medications: ? ?traMADol (ULTRAM) 50 MG tablet  ? ?Please advise. ? ?

## 2021-09-12 MED ORDER — TRAMADOL HCL 50 MG PO TABS: ORAL_TABLET | ORAL | 2 refills | Status: DC

## 2021-09-19 ENCOUNTER — Other Ambulatory Visit: Payer: Self-pay | Admitting: Family Medicine

## 2021-09-19 DIAGNOSIS — E669 Obesity, unspecified: Secondary | ICD-10-CM | POA: Diagnosis not present

## 2021-09-19 DIAGNOSIS — M5136 Other intervertebral disc degeneration, lumbar region: Secondary | ICD-10-CM

## 2021-09-19 DIAGNOSIS — E559 Vitamin D deficiency, unspecified: Secondary | ICD-10-CM | POA: Diagnosis not present

## 2021-09-19 DIAGNOSIS — F1120 Opioid dependence, uncomplicated: Secondary | ICD-10-CM | POA: Diagnosis not present

## 2021-09-19 DIAGNOSIS — G250 Essential tremor: Secondary | ICD-10-CM | POA: Diagnosis not present

## 2021-09-19 DIAGNOSIS — G20 Parkinson's disease: Secondary | ICD-10-CM | POA: Diagnosis not present

## 2021-09-19 DIAGNOSIS — E1169 Type 2 diabetes mellitus with other specified complication: Secondary | ICD-10-CM | POA: Diagnosis not present

## 2021-09-19 DIAGNOSIS — G8929 Other chronic pain: Secondary | ICD-10-CM | POA: Diagnosis not present

## 2021-09-19 DIAGNOSIS — E785 Hyperlipidemia, unspecified: Secondary | ICD-10-CM | POA: Diagnosis not present

## 2021-09-19 DIAGNOSIS — G4733 Obstructive sleep apnea (adult) (pediatric): Secondary | ICD-10-CM | POA: Diagnosis not present

## 2021-09-19 DIAGNOSIS — E1142 Type 2 diabetes mellitus with diabetic polyneuropathy: Secondary | ICD-10-CM | POA: Diagnosis not present

## 2021-09-19 DIAGNOSIS — E1122 Type 2 diabetes mellitus with diabetic chronic kidney disease: Secondary | ICD-10-CM | POA: Diagnosis not present

## 2021-09-19 DIAGNOSIS — N183 Chronic kidney disease, stage 3 unspecified: Secondary | ICD-10-CM | POA: Diagnosis not present

## 2021-09-20 ENCOUNTER — Other Ambulatory Visit: Payer: Self-pay | Admitting: Family Medicine

## 2021-10-18 ENCOUNTER — Ambulatory Visit: Payer: HMO | Admitting: Family Medicine

## 2021-10-24 ENCOUNTER — Telehealth: Payer: Self-pay

## 2021-10-24 NOTE — Telephone Encounter (Signed)
Copied from [place] [id number]. Topic: General - Other ?>> Oct 24, 2021 12:24 PM Yvette Rack wrote: ?Reason for CRM: Pt request that Cristie Hem or the assistant return his call at [phone] ?

## 2021-10-29 ENCOUNTER — Inpatient Hospital Stay
Admission: EM | Admit: 2021-10-29 | Discharge: 2021-10-31 | DRG: 871 | Disposition: A | Discharge status: Home / Self Care | Payer: HMO | Attending: Internal Medicine | Admitting: Internal Medicine

## 2021-10-29 ENCOUNTER — Other Ambulatory Visit: Payer: Self-pay

## 2021-10-29 ENCOUNTER — Emergency Department: Payer: HMO

## 2021-10-29 DIAGNOSIS — I10 Essential (primary) hypertension: Secondary | ICD-10-CM | POA: Diagnosis present

## 2021-10-29 DIAGNOSIS — Z87891 Personal history of nicotine dependence: Secondary | ICD-10-CM | POA: Diagnosis not present

## 2021-10-29 DIAGNOSIS — J159 Unspecified bacterial pneumonia: Secondary | ICD-10-CM | POA: Diagnosis present

## 2021-10-29 DIAGNOSIS — E1122 Type 2 diabetes mellitus with diabetic chronic kidney disease: Secondary | ICD-10-CM | POA: Diagnosis not present

## 2021-10-29 DIAGNOSIS — R000 Tachycardia, unspecified: Secondary | ICD-10-CM | POA: Diagnosis not present

## 2021-10-29 DIAGNOSIS — Z7984 Long term (current) use of oral hypoglycemic drugs: Secondary | ICD-10-CM

## 2021-10-29 DIAGNOSIS — I129 Hypertensive chronic kidney disease with stage 1 through stage 4 chronic kidney disease, or unspecified chronic kidney disease: Secondary | ICD-10-CM | POA: Diagnosis not present

## 2021-10-29 DIAGNOSIS — Z833 Family history of diabetes mellitus: Secondary | ICD-10-CM

## 2021-10-29 DIAGNOSIS — J189 Pneumonia, unspecified organism: Secondary | ICD-10-CM | POA: Diagnosis present

## 2021-10-29 DIAGNOSIS — R918 Other nonspecific abnormal finding of lung field: Secondary | ICD-10-CM | POA: Diagnosis not present

## 2021-10-29 DIAGNOSIS — A419 Sepsis, unspecified organism: Principal | ICD-10-CM | POA: Diagnosis present

## 2021-10-29 DIAGNOSIS — N189 Chronic kidney disease, unspecified: Secondary | ICD-10-CM

## 2021-10-29 DIAGNOSIS — Z7982 Long term (current) use of aspirin: Secondary | ICD-10-CM | POA: Diagnosis not present

## 2021-10-29 DIAGNOSIS — Z8249 Family history of ischemic heart disease and other diseases of the circulatory system: Secondary | ICD-10-CM

## 2021-10-29 DIAGNOSIS — Z79899 Other long term (current) drug therapy: Secondary | ICD-10-CM

## 2021-10-29 DIAGNOSIS — K219 Gastro-esophageal reflux disease without esophagitis: Secondary | ICD-10-CM | POA: Diagnosis present

## 2021-10-29 DIAGNOSIS — N1831 Chronic kidney disease, stage 3a: Secondary | ICD-10-CM | POA: Diagnosis not present

## 2021-10-29 DIAGNOSIS — N179 Acute kidney failure, unspecified: Secondary | ICD-10-CM | POA: Diagnosis present

## 2021-10-29 DIAGNOSIS — E119 Type 2 diabetes mellitus without complications: Secondary | ICD-10-CM | POA: Diagnosis not present

## 2021-10-29 DIAGNOSIS — Z20822 Contact with and (suspected) exposure to covid-19: Secondary | ICD-10-CM | POA: Diagnosis not present

## 2021-10-29 DIAGNOSIS — E785 Hyperlipidemia, unspecified: Secondary | ICD-10-CM | POA: Diagnosis not present

## 2021-10-29 DIAGNOSIS — E1159 Type 2 diabetes mellitus with other circulatory complications: Secondary | ICD-10-CM | POA: Diagnosis present

## 2021-10-29 DIAGNOSIS — R509 Fever, unspecified: Secondary | ICD-10-CM | POA: Diagnosis not present

## 2021-10-29 LAB — CBC WITH DIFFERENTIAL/PLATELET
Abs Immature Granulocytes: 0.09 10*3/uL — ABNORMAL HIGH (ref 0.00–0.07)
Basophils Absolute: 0 10*3/uL (ref 0.0–0.1)
Basophils Relative: 0 %
Eosinophils Absolute: 0 10*3/uL (ref 0.0–0.5)
Eosinophils Relative: 0 %
HCT: 42.3 % (ref 39.0–52.0)
Hemoglobin: 14 g/dL (ref 13.0–17.0)
Immature Granulocytes: 1 %
Lymphocytes Relative: 5 %
Lymphs Abs: 0.9 10*3/uL (ref 0.7–4.0)
MCH: 31.7 pg (ref 26.0–34.0)
MCHC: 33.1 g/dL (ref 30.0–36.0)
MCV: 95.7 fL (ref 80.0–100.0)
Monocytes Absolute: 1 10*3/uL (ref 0.1–1.0)
Monocytes Relative: 6 %
Neutro Abs: 15.3 10*3/uL — ABNORMAL HIGH (ref 1.7–7.7)
Neutrophils Relative %: 88 %
Platelets: 187 10*3/uL (ref 150–400)
RBC: 4.42 MIL/uL (ref 4.22–5.81)
RDW: 13.1 % (ref 11.5–15.5)
WBC: 17.3 10*3/uL — ABNORMAL HIGH (ref 4.0–10.5)
nRBC: 0 % (ref 0.0–0.2)

## 2021-10-29 LAB — URINALYSIS, ROUTINE W REFLEX MICROSCOPIC
Bilirubin Urine: NEGATIVE
Glucose, UA: NEGATIVE mg/dL
Hgb urine dipstick: NEGATIVE
Ketones, ur: NEGATIVE mg/dL
Leukocytes,Ua: NEGATIVE
Nitrite: NEGATIVE
Protein, ur: NEGATIVE mg/dL
Specific Gravity, Urine: 1.024 (ref 1.005–1.030)
pH: 5 (ref 5.0–8.0)

## 2021-10-29 LAB — LACTIC ACID, PLASMA
Lactic Acid, Venous: 1.7 mmol/L (ref 0.5–1.9)
Lactic Acid, Venous: 2.4 mmol/L (ref 0.5–1.9)

## 2021-10-29 LAB — COMPREHENSIVE METABOLIC PANEL
ALT: 24 U/L (ref 0–44)
AST: 27 U/L (ref 15–41)
Albumin: 3.8 g/dL (ref 3.5–5.0)
Alkaline Phosphatase: 84 U/L (ref 38–126)
Anion gap: 7 (ref 5–15)
BUN: 31 mg/dL — ABNORMAL HIGH (ref 8–23)
CO2: 22 mmol/L (ref 22–32)
Calcium: 9.2 mg/dL (ref 8.9–10.3)
Chloride: 106 mmol/L (ref 98–111)
Creatinine, Ser: 1.67 mg/dL — ABNORMAL HIGH (ref 0.61–1.24)
GFR, Estimated: 42 mL/min — ABNORMAL LOW (ref >60)
Glucose, Bld: 169 mg/dL — ABNORMAL HIGH (ref 70–99)
Potassium: 3.9 mmol/L (ref 3.5–5.1)
Sodium: 135 mmol/L (ref 135–145)
Total Bilirubin: 1 mg/dL (ref 0.3–1.2)
Total Protein: 7 g/dL (ref 6.5–8.1)

## 2021-10-29 LAB — RESP PANEL BY RT-PCR (FLU A&B, COVID) ARPGX2
Influenza A by PCR: NEGATIVE
Influenza B by PCR: NEGATIVE
SARS Coronavirus 2 by RT PCR: NEGATIVE

## 2021-10-29 LAB — PROTIME-INR
INR: 1.2 (ref 0.8–1.2)
Prothrombin Time: 15.1 seconds (ref 11.4–15.2)

## 2021-10-29 MED ORDER — PANTOPRAZOLE SODIUM 40 MG PO TBEC
40.0000 mg | DELAYED_RELEASE_TABLET | Freq: Every day | ORAL | Status: DC
Start: 2021-10-30 — End: 2021-10-31
  Administered 2021-10-30 – 2021-10-31 (×2): 40 mg via ORAL
  Filled 2021-10-29 (×2): qty 1

## 2021-10-29 MED ORDER — ADULT MULTIVITAMIN W/MINERALS CH
ORAL_TABLET | Freq: Every day | ORAL | Status: DC
  Administered 2021-10-30 – 2021-10-31 (×2): 1 via ORAL
  Filled 2021-10-29 (×3): qty 1

## 2021-10-29 MED ORDER — TIZANIDINE HCL 4 MG PO TABS
4.0000 mg | ORAL_TABLET | Freq: Every day | ORAL | Status: DC
Start: 2021-10-29 — End: 2021-10-31
  Administered 2021-10-30: 4 mg via ORAL
  Filled 2021-10-29 (×2): qty 1

## 2021-10-29 MED ORDER — ASPIRIN 81 MG PO CHEW
81.0000 mg | CHEWABLE_TABLET | Freq: Every day | ORAL | Status: DC
  Administered 2021-10-29 – 2021-10-31 (×3): 81 mg via ORAL
  Filled 2021-10-29 (×3): qty 1

## 2021-10-29 MED ORDER — SODIUM CHLORIDE 0.9 % IV SOLN
1000.0000 mL | Freq: Once | INTRAVENOUS | Status: AC
  Administered 2021-10-29: 1000 mL via INTRAVENOUS

## 2021-10-29 MED ORDER — HYDROCOD POLI-CHLORPHE POLI ER 10-8 MG/5ML PO SUER: 5.0000 mL | Freq: Two times a day (BID) | ORAL | Status: DC | PRN

## 2021-10-29 MED ORDER — IRBESARTAN 75 MG PO TABS
75.0000 mg | ORAL_TABLET | Freq: Every day | ORAL | Status: DC
Start: 2021-10-30 — End: 2021-10-29

## 2021-10-29 MED ORDER — IPRATROPIUM-ALBUTEROL 0.5-2.5 (3) MG/3ML IN SOLN
3.0000 mL | Freq: Four times a day (QID) | RESPIRATORY_TRACT | Status: DC
  Administered 2021-10-30: 3 mL via RESPIRATORY_TRACT
  Filled 2021-10-29: qty 3

## 2021-10-29 MED ORDER — SODIUM CHLORIDE 0.9 % IV SOLN: INTRAVENOUS | Status: DC

## 2021-10-29 MED ORDER — SODIUM CHLORIDE 0.9 % IV SOLN
1.0000 g | Freq: Once | INTRAVENOUS | Status: AC
  Administered 2021-10-29: 1 g via INTRAVENOUS
  Filled 2021-10-29: qty 10

## 2021-10-29 MED ORDER — ONDANSETRON HCL 4 MG PO TABS: 4.0000 mg | ORAL_TABLET | Freq: Four times a day (QID) | ORAL | Status: DC | PRN

## 2021-10-29 MED ORDER — SAW PALMETTO BERRY 160 MG PO CAPS: 2.0000 | ORAL_CAPSULE | Freq: Every day | ORAL | Status: DC

## 2021-10-29 MED ORDER — ONDANSETRON HCL 4 MG/2ML IJ SOLN: 4.0000 mg | Freq: Four times a day (QID) | INTRAMUSCULAR | Status: DC | PRN

## 2021-10-29 MED ORDER — SODIUM CHLORIDE 0.9 % IV SOLN
2.0000 g | INTRAVENOUS | Status: DC
  Administered 2021-10-30: 2 g via INTRAVENOUS
  Filled 2021-10-29: qty 2

## 2021-10-29 MED ORDER — ACETAMINOPHEN 650 MG RE SUPP: 650.0000 mg | Freq: Four times a day (QID) | RECTAL | Status: DC | PRN

## 2021-10-29 MED ORDER — ENOXAPARIN SODIUM 40 MG/0.4ML IJ SOSY
40.0000 mg | PREFILLED_SYRINGE | INTRAMUSCULAR | Status: DC
  Administered 2021-10-30: 40 mg via SUBCUTANEOUS
  Filled 2021-10-29 (×2): qty 0.4

## 2021-10-29 MED ORDER — PIOGLITAZONE HCL 15 MG PO TABS
15.0000 mg | ORAL_TABLET | Freq: Every day | ORAL | Status: DC
  Administered 2021-10-30 – 2021-10-31 (×2): 15 mg via ORAL
  Filled 2021-10-29 (×2): qty 1

## 2021-10-29 MED ORDER — TRAZODONE HCL 50 MG PO TABS: 25.0000 mg | ORAL_TABLET | Freq: Every evening | ORAL | Status: DC | PRN

## 2021-10-29 MED ORDER — ROPINIROLE HCL 0.25 MG PO TABS
0.2500 mg | ORAL_TABLET | Freq: Every day | ORAL | Status: DC
Start: 2021-10-29 — End: 2021-10-31
  Administered 2021-10-30: 0.25 mg via ORAL
  Filled 2021-10-29 (×4): qty 1

## 2021-10-29 MED ORDER — ROSUVASTATIN CALCIUM 10 MG PO TABS
20.0000 mg | ORAL_TABLET | Freq: Every day | ORAL | Status: DC
  Administered 2021-10-30 – 2021-10-31 (×2): 20 mg via ORAL
  Filled 2021-10-29 (×2): qty 2

## 2021-10-29 MED ORDER — TRAMADOL HCL 50 MG PO TABS: 50.0000 mg | ORAL_TABLET | Freq: Three times a day (TID) | ORAL | Status: DC | PRN

## 2021-10-29 MED ORDER — LABETALOL HCL 5 MG/ML IV SOLN: 20.0000 mg | INTRAVENOUS | Status: DC | PRN

## 2021-10-29 MED ORDER — VITAMIN D 25 MCG (1000 UNIT) PO TABS
2000.0000 [IU] | ORAL_TABLET | Freq: Every day | ORAL | Status: DC
  Administered 2021-10-30 – 2021-10-31 (×2): 2000 [IU] via ORAL
  Filled 2021-10-29 (×3): qty 2

## 2021-10-29 MED ORDER — SODIUM CHLORIDE 0.9 % IV SOLN
500.0000 mg | INTRAVENOUS | Status: DC
  Administered 2021-10-30: 500 mg via INTRAVENOUS
  Filled 2021-10-29: qty 500

## 2021-10-29 MED ORDER — MAGNESIUM HYDROXIDE 400 MG/5ML PO SUSP: 30.0000 mL | Freq: Every day | ORAL | Status: DC | PRN

## 2021-10-29 MED ORDER — ACETAMINOPHEN 325 MG PO TABS: 650.0000 mg | ORAL_TABLET | Freq: Four times a day (QID) | ORAL | Status: DC | PRN

## 2021-10-29 MED ORDER — SODIUM CHLORIDE 0.9 % IV SOLN
500.0000 mg | Freq: Once | INTRAVENOUS | Status: AC
  Administered 2021-10-29: 500 mg via INTRAVENOUS
  Filled 2021-10-29: qty 5

## 2021-10-29 MED ORDER — LORATADINE 10 MG PO TABS
10.0000 mg | ORAL_TABLET | Freq: Every day | ORAL | Status: DC | PRN
Start: 2021-10-29 — End: 2021-10-31

## 2021-10-29 MED ORDER — SILDENAFIL CITRATE 20 MG PO TABS: 20.0000 mg | ORAL_TABLET | Freq: Every day | ORAL | Status: DC

## 2021-10-29 MED ORDER — GUAIFENESIN ER 600 MG PO TB12
600.0000 mg | ORAL_TABLET | Freq: Two times a day (BID) | ORAL | Status: DC
  Administered 2021-10-29 – 2021-10-31 (×4): 600 mg via ORAL
  Filled 2021-10-29 (×4): qty 1

## 2021-10-29 MED ORDER — SODIUM CHLORIDE 0.9 % IV BOLUS
2000.0000 mL | Freq: Once | INTRAVENOUS | Status: AC
Start: 2021-10-29 — End: 2021-10-29
  Administered 2021-10-29: 2000 mL via INTRAVENOUS

## 2021-10-29 NOTE — Assessment & Plan Note (Addendum)
-  baseline 1.4-1.5 ?- hold off nephrotoxins. ?

## 2021-10-29 NOTE — Assessment & Plan Note (Addendum)
Resume home meds ?

## 2021-10-29 NOTE — ED Provider Notes (Addendum)
? ?[facility] ?Provider Note ? ? ? Event Date/Time  ? First MD Initiated Contact with Patient 10/29/21 1941   ?  (approximate) ? ? ?History  ? ?Fever ? ? ?HPI ? ?Manuel Stafford. is a 76 y.o. male with a history of diabetes, hypertension who presents with complaints of fever, chills fatigue and weakness.  He reports the symptoms started today.  He reported he was trembling under the blankets for nearly an hour.  Reportedly his temperature was over 104.  He denies significant cough, no dysuria.  No abdominal pain or flank pain. ?  ? ? ?Physical Exam  ? ?Triage Vital Signs: ?ED Triage Vitals  ?Enc Vitals Group  ?   BP 10/29/21 1848 (!) 146/103  ?   Pulse Rate 10/29/21 1848 (!) 113  ?   Resp 10/29/21 1848 20  ?   Temp 10/29/21 1848 99.5 ?F (37.5 ?C)  ?   Temp Source 10/29/21 1848 Oral  ?   SpO2 10/29/21 1848 92 %  ?   Weight 10/29/21 1845 98.9 kg (218 lb)  ?   Height 10/29/21 1845 1.829 m (6')  ?   Head Circumference --   ?   Peak Flow --   ?   Pain Score 10/29/21 1845 0  ?   Pain Loc --   ?   Pain Edu? --   ?   Excl. in [place]? --   ? ? ?Most recent vital signs: ?Vitals:  ? 10/29/21 1848 10/29/21 1945  ?BP: (!) 146/103 129/73  ?Pulse: (!) 113 (!) 108  ?Resp: 20   ?Temp: 99.5 ?F (37.5 ?C)   ?SpO2: 92% 95%  ? ? ? ?General: Awake, no distress.  ?CV:  Good peripheral perfusion.  ?Resp:  Normal effort.  ?Abd:  No distention.  ?Other:   ? ? ?ED Results / Procedures / Treatments  ? ?Labs ?(all labs ordered are listed, but only abnormal results are displayed) ?Labs Reviewed  ?COMPREHENSIVE METABOLIC PANEL - Abnormal; Notable for the following components:  ?    Result Value  ? Glucose, Bld 169 (*)   ? BUN 31 (*)   ? Creatinine, Ser 1.67 (*)   ? GFR, Estimated 42 (*)   ? All other components within normal limits  ?CBC WITH DIFFERENTIAL/PLATELET - Abnormal; Notable for the following components:  ? WBC 17.3 (*)   ? Neutro Abs 15.3 (*)   ? Abs Immature Granulocytes 0.09 (*)   ? All other components within  normal limits  ?URINALYSIS, ROUTINE W REFLEX MICROSCOPIC - Abnormal; Notable for the following components:  ? Color, Urine YELLOW (*)   ? APPearance HAZY (*)   ? All other components within normal limits  ?CULTURE, BLOOD (ROUTINE X 2)  ?CULTURE, BLOOD (ROUTINE X 2)  ?RESP PANEL BY RT-PCR (FLU A&B, COVID) [id number]  ?LACTIC ACID, PLASMA  ?PROTIME-INR  ?LACTIC ACID, PLASMA  ? ? ? ?EKG ? ?ED ECG REPORT ?I, Lavonia Drafts, the attending physician, personally viewed and interpreted this ECG. ? ?Date: 10/29/2021 ? ?Rhythm: Tachycardia ?QRS Axis: normal ?Intervals: Abnormal ?ST/T Wave abnormalities: normal ?Narrative Interpretation: no evidence of acute ischemia ? ? ? ?RADIOLOGY ?Chest x-ray viewed by me, left-sided infiltrate noted, pending radiology confirmation ? ? ? ?PROCEDURES: ? ?Critical Care performed: yes ? ?CRITICAL CARE ?Performed by: Lavonia Drafts ? ? ?Total critical care time: 30 minutes ? ?Critical care time was exclusive of separately billable procedures and treating other patients. ? ?Critical care was necessary  to treat or prevent imminent or life-threatening deterioration. ? ?Critical care was time spent personally by me on the following activities: development of treatment plan with patient and/or surrogate as well as nursing, discussions with consultants, evaluation of patient's response to treatment, examination of patient, obtaining history from patient or surrogate, ordering and performing treatments and interventions, ordering and review of laboratory studies, ordering and review of radiographic studies, pulse oximetry and re-evaluation of patient's condition. ? ? ?Procedures ? ? ?MEDICATIONS ORDERED IN ED: ?Medications  ?cefTRIAXone (ROCEPHIN) 1 g in sodium chloride 0.9 % 100 mL IVPB (1 g Intravenous New Bag/Given 10/29/21 2013)  ?azithromycin (ZITHROMAX) 500 mg in sodium chloride 0.9 % 250 mL IVPB (has no administration in time range)  ?0.9 %  sodium chloride infusion (1,000 mLs Intravenous New  Bag/Given 10/29/21 2012)  ? ? ? ?IMPRESSION / MDM / ASSESSMENT AND PLAN / ED COURSE  ?I reviewed the triage vital signs and the nursing notes. ? ?Patient with a history of diabetes presents after episode of rigors today very high fever.  He is feeling somewhat improved now however is tachycardic still with elevated temperature. ? ?High concern for sepsis/significant infection given his presentation.  Differential also includes viral illness.  He does have a history of kidney stones but is not having any flank pain today. ? ?No dysuria ? ?Patient's lab work is notable for a white blood cell count of 17.3 elevated BUN/creatinine ratio ? ?Urinalysis is unremarkable ? ?Chest x-ray is concerning for left lower lobe pneumonia ? ?This is likely the cause of his symptoms.  Given his history of diabetes, elevated heart rate and elevated white blood cell count concern for early sepsis.  We will treat with IV Rocephin IV azithromycin, IV fluids ? ?Consulted the hospitalist service for admission ? ? ? ? ? ?  ? ? ?FINAL CLINICAL IMPRESSION(S) / ED DIAGNOSES  ? ?Final diagnoses:  ?Community acquired pneumonia of left lower lobe of lung  ?Sepsis, due to unspecified organism, unspecified whether acute organ dysfunction present [facility])  ? ? ? ?Rx / DC Orders  ? ?ED Discharge Orders   ? ? None  ? ?  ? ? ? ?Note:  This document was prepared using Dragon voice recognition software and may include unintentional dictation errors. ?  ?Lavonia Drafts, MD ?10/29/21 2013 ? ?  ?Lavonia Drafts, MD ?10/29/21 2016 ? ?

## 2021-10-29 NOTE — Progress Notes (Signed)
Lab called informing Lactic acid critical value, NP made aware and ordered fluids started. ?

## 2021-10-29 NOTE — Assessment & Plan Note (Addendum)
-   continue statin therapy. 

## 2021-10-29 NOTE — Assessment & Plan Note (Deleted)
-   We will hold off HCTZ and ARB given mild AKI. ?

## 2021-10-29 NOTE — Assessment & Plan Note (Addendum)
--   resume home meds 

## 2021-10-29 NOTE — ED Triage Notes (Signed)
Pt via POV from home. Pt c/o fever and chills started at 1400 today. Denies any cough/congestion. Pt also endorses RLQ pain but denies any NVD. Pt reports a temp of 106.3 temporally at 1600. Pt states he took some Advil approx 40 mins ago. States it was 103.3 right before arriving ?

## 2021-10-29 NOTE — H&P (Signed)
?  ?  ?[place] ? ? ?PATIENT NAME: Manuel Stafford   ? ?MR#:  [id number] ? ?DATE OF BIRTH:  [birth year] ? ?DATE OF ADMISSION:  10/29/2021 ? ?PRIMARY CARE PHYSICIAN: [name], MD  ? ?Patient is coming from: Home ? ?REQUESTING/REFERRING PHYSICIAN: Lavonia Drafts, MD ? ?CHIEF COMPLAINT:  ? ?Chief Complaint  ?Patient presents with  ? Fever  ? ? ?HISTORY OF PRESENT ILLNESS:  ?Manuel Stafford. is a 76 y.o. Caucasian male with medical history significant for hypertension, dyslipidemia, type 2 diabetes mellitus and GERD, who presented to the ER with acute onset of rigors and chills that lasted about an hour today with a temperature over 104 with associated generalized weakness and fatigue.  He has been coughing lately that he believes is related to him throat clearing and it has been getting slightly worse lately.  He admits to nasal and sinus congestion.  He has been having dyspnea that he describes in not able to take a deep breath.  No wheezing.  No chest pain or palpitations.  No dysuria, oliguria or hematuria or flank pain.  No nausea or vomiting or diarrhea. ? ?ED Course: When he came to the ER BP was 146/103 with heart rate of 113 and pulse symmetry was 92 and later 95% on room air.  Labs revealed  BUN of 31 and creatinine 1.67 compared to 19 and 1.248 on 07/27/2021.  CBC showed leukocytosis 17.3 with neutrophilia and UA was unremarkable.  Blood cultures were drawn. ?EKG as reviewed by me : EKG showed junctional tachycardia with a rate of 105 with right axis deviation and prolonged QT interval with QTc of 607 MS. ?Imaging: Two-view chest x-ray showed opacity in one of the lung bases posteriorly only seen on the lateral view with inability to exclude pneumonia.  Recommendation was for short-term follow-up imaging. ? ?The patient was given 1 L bolus of IV normal saline, IV Rocephin and Zithromax.  He will be admitted to a medical telemetry bed for further evaluation and management. ?PAST MEDICAL  HISTORY:  ? ?Past Medical History:  ?Diagnosis Date  ? Back pain   ? lower  ? Diabetes mellitus without complication ([place])   ? Patient takes Metformin.  ? Elevated lipids   ? GERD (gastroesophageal reflux disease)   ? Hemorrhoids 2017  ? Hyperlipidemia   ? Hypertension   ? Kidney stone 2017  ? Parkinsonian features   ? ? ?PAST SURGICAL HISTORY:  ? ?Past Surgical History:  ?Procedure Laterality Date  ? CIRCUMCISION    ? COLONOSCOPY  2007  ? COLONOSCOPY WITH PROPOFOL N/A 08/22/2016  ? Procedure: COLONOSCOPY WITH PROPOFOL;  Surgeon: Christene Lye, MD;  Location: [facility];  Service: Endoscopy;  Laterality: N/A;  ? CYSTOSCOPY WITH STENT PLACEMENT Right 09/18/2015  ? Procedure: CYSTOSCOPY WITH STENT PLACEMENT;  Surgeon: Hollice Espy, MD;  Location: [facility];  Service: Urology;  Laterality: Right;  ? CYSTOSCOPY WITH URETEROSCOPY, STONE BASKETRY AND STENT PLACEMENT Right 10/03/2015  ? Procedure: CYSTOSCOPY WITH URETEROSCOPY, STONE BASKETRY AND STENT PLACEMENT;  Surgeon: Hollice Espy, MD;  Location: [facility];  Service: Urology;  Laterality: Right;  ? EXTRACORPOREAL SHOCK WAVE LITHOTRIPSY Right 09/15/2015  ? Procedure: EXTRACORPOREAL SHOCK WAVE LITHOTRIPSY (ESWL);  Surgeon: Nickie Retort, MD;  Location: [facility];  Service: Urology;  Laterality: Right;  ? HERNIA REPAIR    ? ROTATOR CUFF REPAIR Right 06/2012  ? ? ?SOCIAL HISTORY:  ? ?Social History  ? ?Tobacco Use  ? Smoking  status: Former  ?  Packs/day: 2.00  ?  Years: 15.00  ?  Pack years: 30.00  ?  Types: Cigarettes  ?  Quit date: 06/25/1974  ?  Years since quitting: 47.3  ? Smokeless tobacco: Never  ?Substance Use Topics  ? Alcohol use: No  ? ? ?FAMILY HISTORY:  ? ?Family History  ?Problem Relation Age of Onset  ? Hypertension Mother   ? Diabetes Father   ? CAD Father   ? Diabetes Sister   ? Heart disease Sister   ? CAD Sister   ? ? ?DRUG ALLERGIES:  ?No Known Allergies ? ?REVIEW OF SYSTEMS:  ? ?ROS ?As per history of present illness. All pertinent systems  were reviewed above. Constitutional, HEENT, cardiovascular, respiratory, GI, GU, musculoskeletal, neuro, psychiatric, endocrine, integumentary and hematologic systems were reviewed and are otherwise negative/unremarkable except for positive findings mentioned above in the HPI. ? ? ?MEDICATIONS AT HOME:  ? ?Prior to Admission medications   ?Medication Sig Start Date End Date Taking? Authorizing Provider  ?aspirin 81 MG tablet Take 81 mg by mouth daily.  04/20/10  Yes [provider]  ?cetirizine (ZYRTEC) 10 MG tablet TAKE 1 TABLET BY MOUTH EVERY DAY 06/08/21  Yes Birdie Sons, MD  ?Cholecalciferol (VITAMIN D3) 2000 UNITS capsule Take 2,000 Units by mouth daily.    Yes [provider]  ?cyproheptadine (PERIACTIN) 4 MG tablet TAKE ONE TABLET 3 HOURS PRIOR TO INTERCOURSE 09/05/21  Yes McGowan, Larene Beach A, PA-C  ?hydrochlorothiazide (HYDRODIURIL) 25 MG tablet TAKE 1 TABLET BY MOUTH EVERY DAY 09/20/21  Yes [name], MD  ?metFORMIN (GLUCOPHAGE) 1000 MG tablet TAKE 1 TABLET (1,000 MG TOTAL) BY MOUTH 2 (TWO) TIMES DAILY WITH A MEAL. 03/13/21  Yes [name], MD  ?MULTIPLE VITAMIN PO Take by mouth daily.  04/20/10  Yes [provider]  ?pantoprazole (PROTONIX) 40 MG tablet Take 40 mg by mouth daily. 06/13/21  Yes [provider]  ?pioglitazone (ACTOS) 15 MG tablet TAKE 1 TABLET BY MOUTH EVERY DAY 09/20/21  Yes [name], MD  ?rOPINIRole (REQUIP) 0.25 MG tablet Take by mouth daily.  01/04/20  Yes [provider]  ?rosuvastatin (CRESTOR) 20 MG tablet TAKE 1 TABLET BY MOUTH EVERY DAY 09/20/21  Yes [name], MD  ?[facility], [place] repens, (SAW PALMETTO BERRY) 160 MG CAPS Take 2 tablets by mouth daily.  04/20/10  Yes [provider]  ?sildenafil (REVATIO) 20 MG tablet Take 20 mg by mouth daily. 08/22/21  Yes [provider]  ?sildenafil (VIAGRA) 100 MG tablet Take 100 mg by mouth daily as needed for erectile  dysfunction.   Yes [provider]  ?tadalafil (CIALIS) 5 MG tablet Take 1 tablet (5 mg total) by mouth daily. 08/23/21  Yes Hollice Espy, MD  ?telmisartan (MICARDIS) 40 MG tablet TAKE 1 TABLET BY MOUTH EVERY DAY 03/13/21  Yes [name], MD  ?tiZANidine (ZANAFLEX) 4 MG tablet TAKE 1 TABLET BY MOUTH EVERYDAY AT BEDTIME 09/06/21  Yes [name], MD  ?Continuous Blood Gluc Sensor (FREESTYLE LIBRE 2 SENSOR) MISC USE AS DIRECTED. CONTINUOUS BLOOD SENSOR. 06/08/21   Birdie Sons, MD  ?Semaglutide,0.25 or 0.'5MG'$ /DOS, (OZEMPIC, 0.25 OR 0.5 MG/DOSE,) 2 MG/1.5ML SOPN Inject 0.5 mg into the skin once a week. 05/11/20   [name], MD  ?tadalafil (CIALIS) 20 MG tablet TAKE 0.5-1 TABLET BY MOUTH EVERY OTHER DAY AS NEEDED FOR ERECTILE DYSFUNCTION 06/07/21  Myles Gip, DO  ?traMADol (ULTRAM) 50 MG tablet TAKE 1 TABLET BY MOUTH 3 TIMES A DAY AS NEEDED FOR BACK OR LEG PAIN 09/12/21   [name], MD  ?valACYclovir (VALTREX) 1000 MG tablet Take 1 tablet (1,000 mg total) by mouth 3 (three) times daily. 06/15/20   [name], MD  ? ?  ? ?VITAL SIGNS:  ?Blood pressure 129/73, pulse (!) 108, temperature 99.5 ?F (37.5 ?C), temperature source Oral, resp. rate 20, height 6' (1.829 m), weight 98.9 kg, SpO2 95 %. ? ?PHYSICAL EXAMINATION:  ?Physical Exam ? ?GENERAL:  76 y.o.-year-old Caucasian male patient lying in the bed with no acute distress.  ?EYES: Pupils equal, round, reactive to light and accommodation. No scleral icterus. Extraocular muscles intact.  ?HEENT: Head atraumatic, normocephalic. Oropharynx and nasopharynx clear.  ?NECK:  Supple, no jugular venous distention. No thyroid enlargement, no tenderness.  ?LUNGS: Diminished with basal breath sounds with left basal crackles.  No use of accessory muscles of respiration.  ?CARDIOVASCULAR: Regular rate and rhythm, S1, S2 normal. No murmurs, rubs, or gallops.  ?ABDOMEN: Soft, nondistended, nontender. Bowel  sounds present. No organomegaly or mass.  ?EXTREMITIES: No pedal edema, cyanosis, or clubbing.  ?NEUROLOGIC: Cranial nerves II through XII are intact. Muscle strength 5/5 in all extremities. Sensation intact. Gait not c

## 2021-10-29 NOTE — Progress Notes (Incomplete)
? ?      CROSS COVER NOTE ? ?NAME: Manuel Stafford. ?MRN: [id number] ?DOB : [birth year] ? ? ? ?Date of Service ?  10/29/2021  ?HPI/Events of Note ?  Lactic drawn at 2140 is 2.4 ? ?At this time patient has not received ordered 2L of NS ?Continuous fluids started at 2319  ?Interventions ?  Plan: ?Start continuous fluids ?Give 2L bolus due at 2045 ?Recheck Lactic at 0230 ?   ?  ? ?

## 2021-10-29 NOTE — Assessment & Plan Note (Addendum)
-   likely community-acquired bacterial pneumonia ?-  strep pna negative ?-BC NGTD ?-  antibiotic therapy with IV Rocephin and Zithromax--change to doxy ?No need for O2 ?

## 2021-10-30 DIAGNOSIS — J189 Pneumonia, unspecified organism: Secondary | ICD-10-CM | POA: Diagnosis not present

## 2021-10-30 DIAGNOSIS — A419 Sepsis, unspecified organism: Secondary | ICD-10-CM | POA: Diagnosis not present

## 2021-10-30 LAB — CBC
HCT: 34.6 % — ABNORMAL LOW (ref 39.0–52.0)
Hemoglobin: 11.6 g/dL — ABNORMAL LOW (ref 13.0–17.0)
MCH: 32.2 pg (ref 26.0–34.0)
MCHC: 33.5 g/dL (ref 30.0–36.0)
MCV: 96.1 fL (ref 80.0–100.0)
Platelets: 131 10*3/uL — ABNORMAL LOW (ref 150–400)
RBC: 3.6 MIL/uL — ABNORMAL LOW (ref 4.22–5.81)
RDW: 13.2 % (ref 11.5–15.5)
WBC: 14.7 10*3/uL — ABNORMAL HIGH (ref 4.0–10.5)
nRBC: 0 % (ref 0.0–0.2)

## 2021-10-30 LAB — BASIC METABOLIC PANEL
Anion gap: 3 — ABNORMAL LOW (ref 5–15)
BUN: 27 mg/dL — ABNORMAL HIGH (ref 8–23)
CO2: 24 mmol/L (ref 22–32)
Calcium: 8 mg/dL — ABNORMAL LOW (ref 8.9–10.3)
Chloride: 111 mmol/L (ref 98–111)
Creatinine, Ser: 1.58 mg/dL — ABNORMAL HIGH (ref 0.61–1.24)
GFR, Estimated: 45 mL/min — ABNORMAL LOW (ref >60)
Glucose, Bld: 74 mg/dL (ref 70–99)
Potassium: 3.5 mmol/L (ref 3.5–5.1)
Sodium: 138 mmol/L (ref 135–145)

## 2021-10-30 LAB — PROCALCITONIN: Procalcitonin: 1.58 ng/mL

## 2021-10-30 LAB — STREP PNEUMONIAE URINARY ANTIGEN: Strep Pneumo Urinary Antigen: NEGATIVE

## 2021-10-30 LAB — PROTIME-INR
INR: 1.4 — ABNORMAL HIGH (ref 0.8–1.2)
Prothrombin Time: 16.7 seconds — ABNORMAL HIGH (ref 11.4–15.2)

## 2021-10-30 LAB — CORTISOL-AM, BLOOD: Cortisol - AM: 7.5 ug/dL (ref 6.7–22.6)

## 2021-10-30 LAB — LACTIC ACID, PLASMA: Lactic Acid, Venous: 1.3 mmol/L (ref 0.5–1.9)

## 2021-10-30 MED ORDER — SALINE SPRAY 0.65 % NA SOLN
1.0000 | NASAL | Status: DC | PRN
  Administered 2021-10-30: 1 via NASAL
  Filled 2021-10-30: qty 44

## 2021-10-30 MED ORDER — SODIUM CHLORIDE 0.9 % IV SOLN: INTRAVENOUS | Status: DC | PRN

## 2021-10-30 MED ORDER — IPRATROPIUM-ALBUTEROL 0.5-2.5 (3) MG/3ML IN SOLN
3.0000 mL | Freq: Two times a day (BID) | RESPIRATORY_TRACT | Status: DC
  Administered 2021-10-31: 3 mL via RESPIRATORY_TRACT
  Filled 2021-10-30: qty 3

## 2021-10-30 NOTE — Progress Notes (Signed)
?PROGRESS NOTE ? ? ? ?Manuel Stafford Char.  ONG:[id number] DOB: 07/31/1945 DOA: 10/29/2021 ?PCP: [name], MD  ? ? ?Brief Narrative:  ?76 y.o. Caucasian male with medical history significant for hypertension, dyslipidemia, type 2 diabetes mellitus and GERD, who presented to the ER with acute onset of rigors and chills that lasted about an hour today with a temperature over 104 with associated generalized weakness and fatigue.  He has been coughing lately.  Found to have a PNA.  ? ? ?Assessment and Plan: ?* Sepsis due to pneumonia [facility]) ?- likely community-acquired bacterial pneumonia ?- check strep pna ?-BC NGTD ?- continue antibiotic therapy with IV Rocephin and Zithromax. ?-home O2 study ?-suspect can d/c in AM ? ?Acute kidney injury superimposed on chronic kidney disease ([place]) ?- IVF ?-baseline 1.4-1.5 ?- hold off nephrotoxins. ? ?Dyslipidemia ?- continue statin therapy. ? ?Benign essential HTN ?-  hold off HCTZ and ARB given mild AKI. And hypotension ?- We will place him on as needed IV labetalol. ? ?Type 2 diabetes mellitus without complications ([place]) ?-  supplemental coverage with NovoLog/SSI. ?- continue Actos and hold off metformin. ? ? ? ? ? ? ? ?DVT prophylaxis: enoxaparin (LOVENOX) injection 40 mg Start: 10/29/21 2030 ? ?  Code Status: Full Code ?Family Communication:  ? ?Disposition Plan:  ?Level of care: Telemetry Medical ?Status is: Inpatient ?Remains inpatient appropriate because: home in AM if BP better ?  ? ?Consultants:  ?none ? ? ?Subjective: ?No further chills ? ?Objective: ?Vitals:  ? 10/29/21 2100 10/29/21 2130 10/29/21 2223 10/30/21 0330  ?BP: (!) 121/59 (!) 112/55 (!) 128/47 (!) 90/46  ?Pulse: 88 93 95 76  ?Resp: '14 17 20 16  '$ ?Temp:  98.1 ?F (36.7 ?C) 100.3 ?F (37.9 ?C) 98.1 ?F (36.7 ?C)  ?TempSrc:  Oral    ?SpO2: 96% 98% 96% 96%  ?Weight:      ?Height:      ? ? ?Intake/Output Summary (Last 24 hours) at 10/30/2021 0909 ?Last data filed at 10/30/2021 4401 ?Gross per 24 hour  ?Intake  1551.34 ml  ?Output 890 ml  ?Net 661.34 ml  ? ?Filed Weights  ? 10/29/21 1845  ?Weight: 98.9 kg  ? ? ?Examination: ? ? ?General: Appearance:     ?Overweight male in no acute distress  ?   ?Lungs:     respirations unlabored  ?Heart:    Normal heart rateNormal rhythm. No murmurs, rubs, or gallops.  ?  ?MS:   All extremities are intact.  ?  ?Neurologic:   Awake, alert  ?  ? ? ? ?Data Reviewed: I have personally reviewed following labs and imaging studies ? ?CBC: ?Recent Labs  ?Lab 10/29/21 ?1850 10/30/21 ?0224  ?WBC 17.3* 14.7*  ?NEUTROABS 15.3*  --   ?HGB 14.0 11.6*  ?HCT 42.3 34.6*  ?MCV 95.7 96.1  ?PLT 187 131*  ? ?Basic Metabolic Panel: ?Recent Labs  ?Lab 10/29/21 ?1850 10/30/21 ?0224  ?NA 135 138  ?K 3.9 3.5  ?CL 106 111  ?CO2 22 24  ?GLUCOSE 169* 74  ?BUN 31* 27*  ?CREATININE 1.67* 1.58*  ?CALCIUM 9.2 8.0*  ? ?GFR: ?Estimated Creatinine Clearance: 47.7 mL/min (A) (by C-G formula based on SCr of 1.58 mg/dL (H)). ?Liver Function Tests: ?Recent Labs  ?Lab 10/29/21 ?1850  ?AST 27  ?ALT 24  ?ALKPHOS 84  ?BILITOT 1.0  ?PROT 7.0  ?ALBUMIN 3.8  ? ?No results for input(s): LIPASE, AMYLASE in the last 168 hours. ?No results for input(s): AMMONIA  in the last 168 hours. ?Coagulation Profile: ?Recent Labs  ?Lab 10/29/21 ?1850 10/30/21 ?0224  ?INR 1.2 1.4*  ? ?Cardiac Enzymes: ?No results for input(s): CKTOTAL, CKMB, CKMBINDEX, TROPONINI in the last 168 hours. ?BNP (last 3 results) ?No results for input(s): PROBNP in the last 8760 hours. ?HbA1C: ?No results for input(s): HGBA1C in the last 72 hours. ?CBG: ?No results for input(s): GLUCAP in the last 168 hours. ?Lipid Profile: ?No results for input(s): CHOL, HDL, LDLCALC, TRIG, CHOLHDL, LDLDIRECT in the last 72 hours. ?Thyroid Function Tests: ?No results for input(s): TSH, T4TOTAL, FREET4, T3FREE, THYROIDAB in the last 72 hours. ?Anemia Panel: ?No results for input(s): VITAMINB12, FOLATE, FERRITIN, TIBC, IRON, RETICCTPCT in the last 72 hours. ?Sepsis Labs: ?Recent Labs  ?Lab  10/29/21 ?1850 10/29/21 ?2140 10/30/21 ?0224  ?PROCALCITON  --   --  1.58  ?LATICACIDVEN 1.7 2.4* 1.3  ? ? ?Recent Results (from the past 240 hour(s))  ?Culture, blood (Routine x 2)     Status: None (Preliminary result)  ? Collection Time: 10/29/21  6:50 PM  ? Specimen: Left Antecubital; Blood  ?Result Value Ref Range Status  ? Specimen Description LEFT ANTECUBITAL  Final  ? Special Requests   Final  ?  BOTTLES DRAWN AEROBIC AND ANAEROBIC Blood Culture adequate volume  ? Culture   Final  ?  NO GROWTH < 12 HOURS ?Performed at [facility], [address] [place], [place] [address] ?  ? Report Status PENDING  Incomplete  ?Culture, blood (Routine x 2)     Status: None (Preliminary result)  ? Collection Time: 10/29/21  7:57 PM  ? Specimen: BLOOD  ?Result Value Ref Range Status  ? Specimen Description BLOOD BLOOD RIGHT FOREARM  Final  ? Special Requests   Final  ?  BOTTLES DRAWN AEROBIC AND ANAEROBIC Blood Culture results may not be optimal due to an excessive volume of blood received in culture bottles  ? Culture   Final  ?  NO GROWTH < 12 HOURS ?Performed at [facility], [address] [place], [place] [address] ?  ? Report Status PENDING  Incomplete  ?Resp Panel by RT-PCR (Flu A&B, Covid) Nasopharyngeal Swab     Status: None  ? Collection Time: 10/29/21  7:57 PM  ? Specimen: Nasopharyngeal Swab; Nasopharyngeal(NP) swabs in vial transport medium  ?Result Value Ref Range Status  ? SARS Coronavirus 2 by RT PCR NEGATIVE NEGATIVE Final  ?  Comment: (NOTE) ?SARS-CoV-2 target nucleic acids are NOT DETECTED. ? ?The SARS-CoV-2 RNA is generally detectable in upper respiratory ?specimens during the acute phase of infection. The lowest ?concentration of SARS-CoV-2 viral copies this assay can detect is ?138 copies/mL. A negative result does not preclude SARS-Cov-2 ?infection and should not be used as the sole basis for treatment or ?other patient management decisions. A negative result may occur with   ?improper specimen collection/handling, submission of specimen other ?than nasopharyngeal swab, presence of viral mutation(s) within the ?areas targeted by this assay, and inadequate number of viral ?copies(<138 copies/mL). A negative result must be combined with ?clinical observations, patient history, and epidemiological ?information. The expected result is Negative. ? ?Fact Sheet for Patients:  ?EntrepreneurPulse.com.au ? ?Fact Sheet for Healthcare Providers:  ?IncredibleEmployment.be ? ?This test is no t yet approved or cleared by the [place] FDA and  ?has been authorized for detection and/or diagnosis of SARS-CoV-2 by ?FDA under an Emergency Use Authorization (EUA). This EUA will remain  ?in effect (meaning this test can be used)  for the duration of the ?COVID-19 declaration under Section 564(b)(1) of the Act, 21 ?U.S.C.section 360bbb-3(b)(1), unless the authorization is terminated  ?or revoked sooner.  ? ? ?  ? Influenza A by PCR NEGATIVE NEGATIVE Final  ? Influenza B by PCR NEGATIVE NEGATIVE Final  ?  Comment: (NOTE) ?The Xpert Xpress SARS-CoV-2/FLU/RSV plus assay is intended as an aid ?in the diagnosis of influenza from Nasopharyngeal swab specimens and ?should not be used as a sole basis for treatment. Nasal washings and ?aspirates are unacceptable for Xpert Xpress SARS-CoV-2/FLU/RSV ?testing. ? ?Fact Sheet for Patients: ?EntrepreneurPulse.com.au ? ?Fact Sheet for Healthcare Providers: ?IncredibleEmployment.be ? ?This test is not yet approved or cleared by the [place] FDA and ?has been authorized for detection and/or diagnosis of SARS-CoV-2 by ?FDA under an Emergency Use Authorization (EUA). This EUA will remain ?in effect (meaning this test can be used) for the duration of the ?COVID-19 declaration under Section 564(b)(1) of the Act, 21 U.S.C. ?section 360bbb-3(b)(1), unless the authorization is terminated  or ?revoked. ? ?Performed at [facility], [place], ?[place] [address] ?  ?  ? ? ? ? ? ?Radiology Studies: ?DG Chest 2 View ? ?Result Date: 10/29/2021 ?CLINICAL DATA:  Suspected sepsis. Fever and chills. R

## 2021-10-30 NOTE — Plan of Care (Signed)
?  Problem: Education: ?Goal: Knowledge of General Education information will improve ?Description: Including pain rating scale, medication(s)/side effects and non-pharmacologic comfort measures ?Outcome: Progressing ?  ?Problem: Health Behavior/Discharge Planning: ?Goal: Ability to manage health-related needs will improve ?Outcome: Progressing ?  ?Problem: Clinical Measurements: ?Goal: Ability to maintain clinical measurements within normal limits will improve ?Outcome: Progressing ?Goal: Will remain free from infection ?Outcome: Progressing ?Goal: Diagnostic test results will improve ?Outcome: Progressing ?Goal: Respiratory complications will improve ?Outcome: Progressing ?Goal: Cardiovascular complication will be avoided ?Outcome: Progressing ?  ?Problem: Activity: ?Goal: Ability to tolerate increased activity will improve ?Outcome: Progressing ?  ?Problem: Clinical Measurements: ?Goal: Ability to maintain a body temperature in the normal range will improve ?Outcome: Progressing ?  ?Problem: Respiratory: ?Goal: Ability to maintain adequate ventilation will improve ?Outcome: Progressing ?Goal: Ability to maintain a clear airway will improve ?Outcome: Progressing ?  ?

## 2021-10-30 NOTE — Hospital Course (Signed)
76 y.o. Caucasian male with medical history significant for hypertension, dyslipidemia, type 2 diabetes mellitus and GERD, who presented to the ER with acute onset of rigors and chills that lasted about an hour today with a temperature over 104 with associated generalized weakness and fatigue.  He has been coughing lately.  Found to have a PNA. ?

## 2021-10-31 DIAGNOSIS — A419 Sepsis, unspecified organism: Secondary | ICD-10-CM | POA: Diagnosis not present

## 2021-10-31 DIAGNOSIS — J189 Pneumonia, unspecified organism: Secondary | ICD-10-CM | POA: Diagnosis not present

## 2021-10-31 LAB — BASIC METABOLIC PANEL
Anion gap: 5 (ref 5–15)
BUN: 20 mg/dL (ref 8–23)
CO2: 22 mmol/L (ref 22–32)
Calcium: 8.8 mg/dL — ABNORMAL LOW (ref 8.9–10.3)
Chloride: 111 mmol/L (ref 98–111)
Creatinine, Ser: 1.38 mg/dL — ABNORMAL HIGH (ref 0.61–1.24)
GFR, Estimated: 53 mL/min — ABNORMAL LOW (ref >60)
Glucose, Bld: 99 mg/dL (ref 70–99)
Potassium: 3.9 mmol/L (ref 3.5–5.1)
Sodium: 138 mmol/L (ref 135–145)

## 2021-10-31 LAB — CBC
HCT: 35.5 % — ABNORMAL LOW (ref 39.0–52.0)
Hemoglobin: 11.8 g/dL — ABNORMAL LOW (ref 13.0–17.0)
MCH: 32.2 pg (ref 26.0–34.0)
MCHC: 33.2 g/dL (ref 30.0–36.0)
MCV: 97 fL (ref 80.0–100.0)
Platelets: 137 10*3/uL — ABNORMAL LOW (ref 150–400)
RBC: 3.66 MIL/uL — ABNORMAL LOW (ref 4.22–5.81)
RDW: 13.2 % (ref 11.5–15.5)
WBC: 8 10*3/uL (ref 4.0–10.5)
nRBC: 0 % (ref 0.0–0.2)

## 2021-10-31 MED ORDER — GUAIFENESIN ER 600 MG PO TB12: 600.0000 mg | ORAL_TABLET | Freq: Two times a day (BID) | ORAL | 0 refills | Status: DC

## 2021-10-31 MED ORDER — DOXYCYCLINE HYCLATE 100 MG PO TBEC: 100.0000 mg | DELAYED_RELEASE_TABLET | Freq: Two times a day (BID) | ORAL | 0 refills | Status: DC

## 2021-10-31 MED ORDER — HYDROCHLOROTHIAZIDE 25 MG PO TABS: 25.0000 mg | ORAL_TABLET | Freq: Every day | ORAL | 1 refills | Status: DC

## 2021-10-31 NOTE — Plan of Care (Signed)
?  Problem: Education: ?Goal: Knowledge of General Education information will improve ?Description: Including pain rating scale, medication(s)/side effects and non-pharmacologic comfort measures ?Outcome: Progressing ?  ?Problem: Health Behavior/Discharge Planning: ?Goal: Ability to manage health-related needs will improve ?Outcome: Progressing ?  ?Problem: Clinical Measurements: ?Goal: Ability to maintain clinical measurements within normal limits will improve ?Outcome: Progressing ?Goal: Will remain free from infection ?Outcome: Progressing ?Goal: Diagnostic test results will improve ?Outcome: Progressing ?Goal: Respiratory complications will improve ?Outcome: Progressing ?Goal: Cardiovascular complication will be avoided ?Outcome: Progressing ?  ?Problem: Activity: ?Goal: Ability to tolerate increased activity will improve ?Outcome: Progressing ?  ?Problem: Clinical Measurements: ?Goal: Ability to maintain a body temperature in the normal range will improve ?Outcome: Progressing ?  ?Problem: Respiratory: ?Goal: Ability to maintain adequate ventilation will improve ?Outcome: Progressing ?Goal: Ability to maintain a clear airway will improve ?Outcome: Progressing ?  ?

## 2021-10-31 NOTE — Progress Notes (Signed)
DISCHARGE NOTE: ? ?Pt given discharge instructions, and verbalized understanding. Pt waiting on ride.  ?

## 2021-10-31 NOTE — Discharge Summary (Signed)
?Physician Discharge Summary ?  ?Patient: Manuel Stafford. MRN: [id number] DOB: 08-Oct-1945  ?Admit date:     10/29/2021  ?Discharge date: 10/31/21  ?Discharge Physician: Geradine Girt  ? ?PCP: [name], MD  ? ?Recommendations at discharge:  ? ? Finish abx for pna ? ?Discharge Diagnoses: ?Principal Problem: ?  Sepsis due to pneumonia [facility]) ?Active Problems: ?  Acute kidney injury superimposed on chronic kidney disease ([place]) ?  Dyslipidemia ?  Benign essential HTN ?  Type 2 diabetes mellitus without complications ([place]) ? ? ? ?Hospital Course: ?76 y.o. Caucasian male with medical history significant for hypertension, dyslipidemia, type 2 diabetes mellitus and GERD, who presented to the ER with acute onset of rigors and chills that lasted about an hour today with a temperature over 104 with associated generalized weakness and fatigue.  He has been coughing lately.  Found to have a PNA. ? ?Assessment and Plan: ?* Sepsis due to pneumonia ([place]) ?- likely community-acquired bacterial pneumonia ?-  strep pna negative ?-BC NGTD ?-  antibiotic therapy with IV Rocephin and Zithromax--change to doxy ?No need for O2 ? ?Acute kidney injury superimposed on chronic kidney disease ([place]) ?-baseline 1.4-1.5 ?- hold off nephrotoxins. ? ?Dyslipidemia ?- continue statin therapy. ? ?Benign essential HTN ?- resume home meds ? ?Type 2 diabetes mellitus without complications ([place]) ?-  Resume home meds ? ? ? ? ?  ? ? ?Disposition: Home ?Diet recommendation:  ?Discharge Diet Orders (From admission, onward)  ? ?  Start     Ordered  ? 10/31/21 0000  Diet - low sodium heart healthy       ? 10/31/21 1009  ? ?  ?  ? ?  ? ?Cardiac diet ?DISCHARGE MEDICATION: ?Allergies as of 10/31/2021   ?No Known Allergies ?  ? ?  ?Medication List  ?  ? ?TAKE these medications   ? ?aspirin 81 MG tablet ?Take 81 mg by mouth daily. ?  ?cetirizine 10 MG tablet ?Commonly known as: ZYRTEC ?TAKE 1 TABLET BY MOUTH EVERY DAY ?  ?cyproheptadine 4 MG  tablet ?Commonly known as: PERIACTIN ?TAKE ONE TABLET 3 HOURS PRIOR TO INTERCOURSE ?  ?doxycycline 100 MG EC tablet ?Commonly known as: DORYX ?Take 1 tablet (100 mg total) by mouth 2 (two) times daily. ?  ?FreeStyle Libre 2 Sensor Misc ?USE AS DIRECTED. CONTINUOUS BLOOD SENSOR. ?  ?guaiFENesin 600 MG 12 hr tablet ?Commonly known as: [place] ?Take 1 tablet (600 mg total) by mouth 2 (two) times daily. ?  ?hydrochlorothiazide 25 MG tablet ?Commonly known as: HYDRODIURIL ?Take 1 tablet (25 mg total) by mouth daily. ?Start taking on: Nov 03, 2021 ?What changed: These instructions start on Nov 03, 2021. If you are unsure what to do until then, ask your doctor or other care provider. ?  ?metFORMIN 1000 MG tablet ?Commonly known as: GLUCOPHAGE ?TAKE 1 TABLET (1,000 MG TOTAL) BY MOUTH 2 (TWO) TIMES DAILY WITH A MEAL. ?  ?MULTIPLE VITAMIN PO ?Take by mouth daily. ?  ?Ozempic (0.25 or 0.5 MG/DOSE) 2 MG/1.5ML Sopn ?Generic drug: Semaglutide(0.25 or 0.'5MG'$ /DOS) ?Inject 0.5 mg into the skin once a week. ?  ?pantoprazole 40 MG tablet ?Commonly known as: PROTONIX ?Take 40 mg by mouth daily. ?  ?pioglitazone 15 MG tablet ?Commonly known as: ACTOS ?TAKE 1 TABLET BY MOUTH EVERY DAY ?  ?rOPINIRole 0.25 MG tablet ?Commonly known as: REQUIP ?Take by mouth daily. ?  ?rosuvastatin 20 MG tablet ?Commonly known as: CRESTOR ?TAKE 1 TABLET  BY MOUTH EVERY DAY ?  ?Saw Palmetto Berry 160 MG Caps ?Take 2 tablets by mouth daily. ?  ?sildenafil 100 MG tablet ?Commonly known as: VIAGRA ?Take 100 mg by mouth daily as needed for erectile dysfunction. ?  ?sildenafil 20 MG tablet ?Commonly known as: REVATIO ?Take 20 mg by mouth daily. ?  ?tadalafil 20 MG tablet ?Commonly known as: CIALIS ?TAKE 0.5-1 TABLET BY MOUTH EVERY OTHER DAY AS NEEDED FOR ERECTILE DYSFUNCTION ?  ?tadalafil 5 MG tablet ?Commonly known as: CIALIS ?Take 1 tablet (5 mg total) by mouth daily. ?  ?telmisartan 40 MG tablet ?Commonly known as: MICARDIS ?TAKE 1 TABLET BY MOUTH EVERY DAY ?   ?tiZANidine 4 MG tablet ?Commonly known as: ZANAFLEX ?TAKE 1 TABLET BY MOUTH EVERYDAY AT BEDTIME ?  ?traMADol 50 MG tablet ?Commonly known as: ULTRAM ?TAKE 1 TABLET BY MOUTH 3 TIMES A DAY AS NEEDED FOR BACK OR LEG PAIN ?  ?valACYclovir 1000 MG tablet ?Commonly known as: VALTREX ?Take 1 tablet (1,000 mg total) by mouth 3 (three) times daily. ?  ?Vitamin D3 50 MCG (2000 UT) capsule ?Take 2,000 Units by mouth daily. ?  ? ?  ? ? ?Discharge Exam: ?Danley Danker Weights  ? 10/29/21 1845  ?Weight: 98.9 kg  ? ? ? ?Condition at discharge: good ? ?The results of significant diagnostics from this hospitalization (including imaging, microbiology, ancillary and laboratory) are listed below for reference.  ? ?Imaging Studies: ?DG Chest 2 View ? ?Result Date: 10/29/2021 ?CLINICAL DATA:  Suspected sepsis. Fever and chills. Right lower quadrant pain. EXAM: CHEST - 2 VIEW COMPARISON:  June 22, 2013 FINDINGS: There appears to be opacity and 1 of the posterior lung bases only seen on the lateral view. The heart, hila, mediastinum, lungs, and pleura are otherwise unremarkable. IMPRESSION: Opacity in 1 of the lung bases posteriorly only seen on the lateral view. Pneumonia not excluded. Recommend short-term follow-up imaging to ensure resolution. Electronically Signed   By: Dorise Bullion III M.D.   On: 10/29/2021 19:18   ? ?Microbiology: ?Results for orders placed or performed during the hospital encounter of 10/29/21  ?Culture, blood (Routine x 2)     Status: None (Preliminary result)  ? Collection Time: 10/29/21  6:50 PM  ? Specimen: Left Antecubital; Blood  ?Result Value Ref Range Status  ? Specimen Description LEFT ANTECUBITAL  Final  ? Special Requests   Final  ?  BOTTLES DRAWN AEROBIC AND ANAEROBIC Blood Culture adequate volume  ? Culture   Final  ?  NO GROWTH 2 DAYS ?Performed at [facility], [address], [place], [place] [address] ?  ? Report Status PENDING  Incomplete  ?Culture, blood (Routine x 2)     Status: None  (Preliminary result)  ? Collection Time: 10/29/21  7:57 PM  ? Specimen: BLOOD  ?Result Value Ref Range Status  ? Specimen Description BLOOD BLOOD RIGHT FOREARM  Final  ? Special Requests   Final  ?  BOTTLES DRAWN AEROBIC AND ANAEROBIC Blood Culture results may not be optimal due to an excessive volume of blood received in culture bottles  ? Culture   Final  ?  NO GROWTH 2 DAYS ?Performed at [facility], [address] [place], [place] [address] ?  ? Report Status PENDING  Incomplete  ?Resp Panel by RT-PCR (Flu A&B, Covid) Nasopharyngeal Swab     Status: None  ? Collection Time: 10/29/21  7:57 PM  ? Specimen: Nasopharyngeal Swab; Nasopharyngeal(NP) swabs in vial transport medium  ?  Result Value Ref Range Status  ? SARS Coronavirus 2 by RT PCR NEGATIVE NEGATIVE Final  ?  Comment: (NOTE) ?SARS-CoV-2 target nucleic acids are NOT DETECTED. ? ?The SARS-CoV-2 RNA is generally detectable in upper respiratory ?specimens during the acute phase of infection. The lowest ?concentration of SARS-CoV-2 viral copies this assay can detect is ?138 copies/mL. A negative result does not preclude SARS-Cov-2 ?infection and should not be used as the sole basis for treatment or ?other patient management decisions. A negative result may occur with  ?improper specimen collection/handling, submission of specimen other ?than nasopharyngeal swab, presence of viral mutation(s) within the ?areas targeted by this assay, and inadequate number of viral ?copies(<138 copies/mL). A negative result must be combined with ?clinical observations, patient history, and epidemiological ?information. The expected result is Negative. ? ?Fact Sheet for Patients:  ?EntrepreneurPulse.com.au ? ?Fact Sheet for Healthcare Providers:  ?IncredibleEmployment.be ? ?This test is no t yet approved or cleared by the [place] FDA and  ?has been authorized for detection and/or diagnosis of SARS-CoV-2 by ?FDA under an Emergency  Use Authorization (EUA). This EUA will remain  ?in effect (meaning this test can be used) for the duration of the ?COVID-19 declaration under Section 564(b)(1) of the Act, 21 ?U.S.C.section 360bbb-3(b)(1), unless t

## 2021-11-03 LAB — CULTURE, BLOOD (ROUTINE X 2)
Culture: NO GROWTH
Culture: NO GROWTH
Special Requests: ADEQUATE

## 2021-11-06 ENCOUNTER — Telehealth: Payer: Self-pay

## 2021-11-06 NOTE — Progress Notes (Signed)
Per Clinical pharmacist, please check patient assistance application for Ozempic.  I reach out to [employer] to get a update on patient assistance application for Ozempic. Per [employer] , patient did reach out to them this morning and was inform of what was missing which was -  page 3 the top box where it says patient- Legal representative authorization. He needs to complete that section and sign where it says signature.Per [employer] that is all that is needed to complete to process the application.   Patient is aware of missing information, and stated he will complete his part on page 3 at his appointment with his PCP on 11/07/2021.Notified Clinical pharmacist.   Anderson [place] Clinical Pharmacist Assistant [phone]

## 2021-11-07 ENCOUNTER — Encounter: Payer: Self-pay | Admitting: Family Medicine

## 2021-11-07 ENCOUNTER — Ambulatory Visit (INDEPENDENT_AMBULATORY_CARE_PROVIDER_SITE_OTHER): Payer: HMO | Admitting: Family Medicine

## 2021-11-07 VITALS — BP 103/62 | HR 78 | Resp 16 | Ht 73.0 in | Wt 218.0 lb

## 2021-11-07 DIAGNOSIS — G4733 Obstructive sleep apnea (adult) (pediatric): Secondary | ICD-10-CM

## 2021-11-07 DIAGNOSIS — E7849 Other hyperlipidemia: Secondary | ICD-10-CM

## 2021-11-07 DIAGNOSIS — E1169 Type 2 diabetes mellitus with other specified complication: Secondary | ICD-10-CM

## 2021-11-07 DIAGNOSIS — Z8719 Personal history of other diseases of the digestive system: Secondary | ICD-10-CM

## 2021-11-07 DIAGNOSIS — J189 Pneumonia, unspecified organism: Secondary | ICD-10-CM

## 2021-11-07 DIAGNOSIS — I10 Essential (primary) hypertension: Secondary | ICD-10-CM

## 2021-11-07 NOTE — Progress Notes (Signed)
Established patient visit  I,[name],acting as a scribe for [name], MD.,have documented all relevant documentation on the behalf of [name], MD,as directed by  [name], MD while in the presence of [name], MD.   Patient: Manuel Stafford.   DOB: [birth year]   76 y.o. Male  MRN: [id number] Visit Date: 11/07/2021  Today's healthcare provider: [name], MD   Chief Complaint  Patient presents with   Follow-up   Diabetes   Hyperlipidemia   Hypertension   Subjective    HPI  This is a transition of care from follow-up for about 10 days ago for pneumonia at the hospital.  Better but is still weak.  He has a lot of sinus drainage.  Concerned about anemia  Comes in today for follow-up.  20 pound weight loss since he became a weight over last year. He is weak at times and is getting over pneumonia.  He does have a lot of sinus symptoms. he does have a history of diverticulitis.  Diabetes Mellitus Type II, follow-up  Lab Results  Component Value Date   HGBA1C 6.3 (H) 07/27/2021   HGBA1C 6.6 (A) 02/16/2021   HGBA1C 6.5 (H) 09/12/2020   Last seen for diabetes 3 months ago.  Management since then includes continuing the same treatment. Home blood sugar records: fasting range: 115-125 Most Recent Eye Exam: 07/05/2021  --------------------------------------------------------------------------------------------------- Hypertension, follow-up  BP Readings from Last 3 Encounters:  11/07/21 103/62  10/31/21 132/77  08/23/21 (!) 143/74   Wt Readings from Last 3 Encounters:  11/07/21 218 lb (98.9 kg)  10/29/21 218 lb (98.9 kg)  08/23/21 220 lb (99.8 kg)     He was last seen for hypertension 3 months ago.  Management since that visit includes; well controlled. Outside blood pressures are normal.  --------------------------------------------------------------------------------------------------- Lipid/Cholesterol,  follow-up  Last Lipid Panel: Lab Results  Component Value Date   CHOL 121 03/16/2020   LDLCALC 55 03/16/2020   HDL 29 (L) 03/16/2020   TRIG 227 (H) 03/16/2020    He was last seen for this 03/16/2020.  Management since that visit includes; on rosuvastatin 20 mg.  Last metabolic panel Lab Results  Component Value Date   GLUCOSE 99 10/31/2021   NA 138 10/31/2021   K 3.9 10/31/2021   BUN 20 10/31/2021   CREATININE 1.38 (H) 10/31/2021   EGFR 49 (L) 07/27/2021   GFRNONAA 53 (L) 10/31/2021   CALCIUM 8.8 (L) 10/31/2021   AST 27 10/29/2021   ALT 24 10/29/2021   The ASCVD Risk score (Arnett DK, et al., 2019) failed to calculate for the following reasons:   The valid total cholesterol range is 130 to 320 mg/dL  ---------------------------------------------------------------------------------------------------   Medications: Outpatient Medications Prior to Visit  Medication Sig   aspirin 81 MG tablet Take 81 mg by mouth daily.    cetirizine (ZYRTEC) 10 MG tablet TAKE 1 TABLET BY MOUTH EVERY DAY   Cholecalciferol (VITAMIN D3) 2000 UNITS capsule Take 2,000 Units by mouth daily.    Continuous Blood Gluc Sensor (FREESTYLE LIBRE 2 SENSOR) MISC USE AS DIRECTED. CONTINUOUS BLOOD SENSOR.   guaiFENesin (MUCINEX) 600 MG 12 hr tablet Take 1 tablet (600 mg total) by mouth 2 (two) times daily.   hydrochlorothiazide (HYDRODIURIL) 25 MG tablet Take 1 tablet (25 mg total) by mouth daily.   metFORMIN (GLUCOPHAGE) 1000 MG tablet TAKE 1 TABLET (1,000 MG TOTAL) BY MOUTH 2 (TWO) TIMES DAILY WITH A  MEAL.   MULTIPLE VITAMIN PO Take by mouth daily.    pantoprazole (PROTONIX) 40 MG tablet Take 40 mg by mouth daily.   pioglitazone (ACTOS) 15 MG tablet TAKE 1 TABLET BY MOUTH EVERY DAY   rOPINIRole (REQUIP) 0.25 MG tablet Take by mouth daily.    rosuvastatin (CRESTOR) 20 MG tablet TAKE 1 TABLET BY MOUTH EVERY DAY   Saw Palmetto, Serenoa repens, (SAW PALMETTO BERRY) 160 MG CAPS Take 2 tablets by mouth daily.     Semaglutide,0.25 or 0.5MG/DOS, (OZEMPIC, 0.25 OR 0.5 MG/DOSE,) 2 MG/1.5ML SOPN Inject 0.5 mg into the skin once a week.   sildenafil (REVATIO) 20 MG tablet Take 20 mg by mouth daily.   sildenafil (VIAGRA) 100 MG tablet Take 100 mg by mouth daily as needed for erectile dysfunction.   tadalafil (CIALIS) 20 MG tablet TAKE 0.5-1 TABLET BY MOUTH EVERY OTHER DAY AS NEEDED FOR ERECTILE DYSFUNCTION   tadalafil (CIALIS) 5 MG tablet Take 1 tablet (5 mg total) by mouth daily.   telmisartan (MICARDIS) 40 MG tablet TAKE 1 TABLET BY MOUTH EVERY DAY   tiZANidine (ZANAFLEX) 4 MG tablet TAKE 1 TABLET BY MOUTH EVERYDAY AT BEDTIME   traMADol (ULTRAM) 50 MG tablet TAKE 1 TABLET BY MOUTH 3 TIMES A DAY AS NEEDED FOR BACK OR LEG PAIN   valACYclovir (VALTREX) 1000 MG tablet Take 1 tablet (1,000 mg total) by mouth 3 (three) times daily.   cyproheptadine (PERIACTIN) 4 MG tablet TAKE ONE TABLET 3 HOURS PRIOR TO INTERCOURSE   [DISCONTINUED] doxycycline (DORYX) 100 MG EC tablet Take 1 tablet (100 mg total) by mouth 2 (two) times daily. (Patient not taking: Reported on 11/07/2021)   No facility-administered medications prior to visit.    Review of Systems  Constitutional:  Negative for appetite change, chills and fever.  Respiratory:  Negative for chest tightness, shortness of breath and wheezing.   Cardiovascular:  Negative for chest pain and palpitations.  Gastrointestinal:  Negative for abdominal pain, nausea and vomiting.   Last hemoglobin A1c Lab Results  Component Value Date   HGBA1C 6.0 (H) 11/07/2021       Objective    BP 103/62 (BP Location: Left Arm, Patient Position: Sitting, Cuff Size: Large)   Pulse 78   Resp 16   Ht 6' 1" (1.854 m)   Wt 218 lb (98.9 kg)   SpO2 99%   BMI 28.76 kg/m  BP Readings from Last 3 Encounters:  11/07/21 103/62  10/31/21 132/77  08/23/21 (!) 143/74   Wt Readings from Last 3 Encounters:  11/07/21 218 lb (98.9 kg)  10/29/21 218 lb (98.9 kg)  08/23/21 220 lb (99.8  kg)      Physical Exam Vitals reviewed.  Constitutional:      Appearance: Normal appearance. He is not diaphoretic.  HENT:     Head: Normocephalic and atraumatic.     Right Ear: External ear normal.     Left Ear: External ear normal.  Eyes:     General: No scleral icterus.    Conjunctiva/sclera: Conjunctivae normal.  Neck:     Vascular: No carotid bruit.  Cardiovascular:     Rate and Rhythm: Normal rate and regular rhythm.     Pulses: Normal pulses.     Heart sounds: Normal heart sounds.  Pulmonary:     Effort: Pulmonary effort is normal.     Breath sounds: Normal breath sounds.  Abdominal:     Palpations: Abdomen is soft.  Lymphadenopathy:     Cervical:  No cervical adenopathy.  Skin:    General: Skin is warm and dry.  Neurological:     General: No focal deficit present.     Mental Status: He is alert and oriented to person, place, and time.  Psychiatric:        Mood and Affect: Mood normal.        Behavior: Behavior normal.        Thought Content: Thought content normal.        Judgment: Judgment normal.      No results found for any visits on 11/07/21.  Assessment & Plan     1. Pneumonia due to infectious organism, unspecified laterality, unspecified part of lung Patient clinically improving from pneumonia.  Repeat chest x-ray when he follows up in 6 weeks. - CBC w/Diff/Platelet  2. Type 2 diabetes mellitus with other specified complication, without long-term current use of insulin (HCC) 1C less than 7.  Finish Ozempic and then stop it.  Lost 20 pounds since his wife passed - Hemoglobin A1c - Comprehensive Metabolic Panel (CMET)  3. Benign essential HTN Good blood pressure control.  Watch for hypotension with all his weight loss. - Comprehensive Metabolic Panel (CMET)  4. Other hyperlipidemia  - Lipid panel - Comprehensive Metabolic Panel (CMET)  5. Obstructive sleep apnea syndrome CPAP  6. History of diverticulitis Follow clinically.   Return  in about 23 days (around 11/30/2021).      I, [name], MD, have reviewed all documentation for this visit. The documentation on 11/10/21 for the exam, diagnosis, procedures, and orders are all accurate and complete.    Richard Cranford Mon, MD  [facility] [phone] (phone) [phone] (fax)  [place]

## 2021-11-07 NOTE — Progress Notes (Signed)
Error

## 2021-11-07 NOTE — Patient Instructions (Signed)
[employer] [place]!! ?

## 2021-11-08 LAB — COMPREHENSIVE METABOLIC PANEL
ALT: 21 IU/L (ref 0–44)
AST: 18 IU/L (ref 0–40)
Albumin/Globulin Ratio: 1.5 (ref 1.2–2.2)
Albumin: 4.3 g/dL (ref 3.7–4.7)
Alkaline Phosphatase: 128 IU/L — ABNORMAL HIGH (ref 44–121)
BUN/Creatinine Ratio: 13 (ref 10–24)
BUN: 21 mg/dL (ref 8–27)
Bilirubin Total: 0.4 mg/dL (ref 0.0–1.2)
CO2: 23 mmol/L (ref 20–29)
Calcium: 9.7 mg/dL (ref 8.6–10.2)
Chloride: 102 mmol/L (ref 96–106)
Creatinine, Ser: 1.62 mg/dL — ABNORMAL HIGH (ref 0.76–1.27)
Globulin, Total: 2.8 g/dL (ref 1.5–4.5)
Glucose: 108 mg/dL — ABNORMAL HIGH (ref 70–99)
Potassium: 4.7 mmol/L (ref 3.5–5.2)
Sodium: 141 mmol/L (ref 134–144)
Total Protein: 7.1 g/dL (ref 6.0–8.5)
eGFR: 43 mL/min/{1.73_m2} — ABNORMAL LOW (ref >59)

## 2021-11-08 LAB — CBC WITH DIFFERENTIAL/PLATELET
Basophils Absolute: 0 10*3/uL (ref 0.0–0.2)
Basos: 1 %
EOS (ABSOLUTE): 0.2 10*3/uL (ref 0.0–0.4)
Eos: 2 %
Hematocrit: 43.4 % (ref 37.5–51.0)
Hemoglobin: 14.5 g/dL (ref 13.0–17.7)
Immature Grans (Abs): 0 10*3/uL (ref 0.0–0.1)
Immature Granulocytes: 1 %
Lymphocytes Absolute: 2.5 10*3/uL (ref 0.7–3.1)
Lymphs: 29 %
MCH: 31.7 pg (ref 26.6–33.0)
MCHC: 33.4 g/dL (ref 31.5–35.7)
MCV: 95 fL (ref 79–97)
Monocytes Absolute: 0.8 10*3/uL (ref 0.1–0.9)
Monocytes: 9 %
Neutrophils Absolute: 5.2 10*3/uL (ref 1.4–7.0)
Neutrophils: 58 %
Platelets: 235 10*3/uL (ref 150–450)
RBC: 4.58 x10E6/uL (ref 4.14–5.80)
RDW: 12.3 % (ref 11.6–15.4)
WBC: 8.8 10*3/uL (ref 3.4–10.8)

## 2021-11-08 LAB — LIPID PANEL
Chol/HDL Ratio: 3.8 ratio (ref 0.0–5.0)
Cholesterol, Total: 107 mg/dL (ref 100–199)
HDL: 28 mg/dL — ABNORMAL LOW (ref >39)
LDL Chol Calc (NIH): 53 mg/dL (ref 0–99)
Triglycerides: 149 mg/dL (ref 0–149)
VLDL Cholesterol Cal: 26 mg/dL (ref 5–40)

## 2021-11-08 LAB — HEMOGLOBIN A1C
Est. average glucose Bld gHb Est-mCnc: 126 mg/dL
Hgb A1c MFr Bld: 6 % — ABNORMAL HIGH (ref 4.8–5.6)

## 2021-11-10 ENCOUNTER — Other Ambulatory Visit: Payer: Self-pay | Admitting: Family Medicine

## 2021-11-13 ENCOUNTER — Ambulatory Visit (INDEPENDENT_AMBULATORY_CARE_PROVIDER_SITE_OTHER): Payer: HMO | Admitting: Family Medicine

## 2021-11-13 ENCOUNTER — Encounter: Payer: Self-pay | Admitting: Family Medicine

## 2021-11-13 ENCOUNTER — Ambulatory Visit: Admission: RE | Admit: 2021-11-13 | Payer: HMO | Source: Ambulatory Visit

## 2021-11-13 VITALS — BP 121/71 | HR 87 | Resp 16 | Wt 217.0 lb

## 2021-11-13 DIAGNOSIS — K5792 Diverticulitis of intestine, part unspecified, without perforation or abscess without bleeding: Secondary | ICD-10-CM

## 2021-11-13 DIAGNOSIS — R1084 Generalized abdominal pain: Secondary | ICD-10-CM

## 2021-11-13 MED ORDER — DOXYCYCLINE HYCLATE 100 MG PO TABS: 100.0000 mg | ORAL_TABLET | Freq: Two times a day (BID) | ORAL | 1 refills | Status: DC

## 2021-11-13 NOTE — Telephone Encounter (Signed)
Requested Prescriptions  Pending Prescriptions Disp Refills  . pioglitazone (ACTOS) 15 MG tablet [Pharmacy Med Name: PIOGLITAZONE HCL 15 MG TABLET] 90 tablet 0    Sig: TAKE 1 TABLET BY MOUTH EVERY DAY     Endocrinology:  Diabetes - Glitazones - pioglitazone Passed - 11/10/2021  9:30 PM      Passed - HBA1C is between 0 and 7.9 and within 180 days    Hgb A1c MFr Bld  Date Value Ref Range Status  11/07/2021 6.0 (H) 4.8 - 5.6 % Final    Comment:             Prediabetes: 5.7 - 6.4          Diabetes: >6.4          Glycemic control for adults with diabetes: <7.0          Passed - Valid encounter within last 6 months    Recent Outpatient Visits          6 days ago Pneumonia due to infectious organism, unspecified laterality, unspecified part of lung   [facility] [name], MD   3 months ago [place], Retia Passe., MD   3 months ago Type 2 diabetes mellitus with other specified complication, without long-term current use of insulin [facility])   [facility] [name], MD   6 months ago Type 2 diabetes mellitus with other specified complication, without long-term current use of insulin [facility])   [facility] [name], MD   9 months ago Type 2 diabetes mellitus with diabetic polyneuropathy, without long-term current use of insulin [facility])   [facility] [name], MD      Future Appointments            Today [name], MD [facility], [place]   In 2 weeks [name], MD [facility], [place]   In 9 months Hollice Espy, MD [facility]

## 2021-11-13 NOTE — Progress Notes (Unsigned)
Established patient visit  I,[name],acting as a scribe for Manuel Durie, MD.,have documented all relevant documentation on the behalf of Manuel Durie, MD,as directed by  Manuel Durie, MD while in the presence of Manuel Durie, MD.   Patient: Manuel Stafford.   DOB: 1945/07/03   76 y.o. Male  MRN: [id number] Visit Date: 11/13/2021  Today's healthcare provider: [name], MD   Chief Complaint  Patient presents with   Flank Pain   Subjective    HPI  Patient is here for left sided flank pain. Patient has ahd left sided flank pain for around 3 years. Patient states pain has worsened. Pain is now radiating from his side to his back.  The pain is actually gotten a little bit worse over the past few days.  There are no aggravating or alleviating circumstances. Medications: Outpatient Medications Prior to Visit  Medication Sig   aspirin 81 MG tablet Take 81 mg by mouth daily.    cetirizine (ZYRTEC) 10 MG tablet TAKE 1 TABLET BY MOUTH EVERY DAY   Cholecalciferol (VITAMIN D3) 2000 UNITS capsule Take 2,000 Units by mouth daily.    Continuous Blood Gluc Sensor (FREESTYLE LIBRE 2 SENSOR) MISC USE AS DIRECTED. CONTINUOUS BLOOD SENSOR.   guaiFENesin (MUCINEX) 600 MG 12 hr tablet Take 1 tablet (600 mg total) by mouth 2 (two) times daily.   hydrochlorothiazide (HYDRODIURIL) 25 MG tablet Take 1 tablet (25 mg total) by mouth daily.   metFORMIN (GLUCOPHAGE) 1000 MG tablet TAKE 1 TABLET (1,000 MG TOTAL) BY MOUTH 2 (TWO) TIMES DAILY WITH A MEAL.   MULTIPLE VITAMIN PO Take by mouth daily.    pantoprazole (PROTONIX) 40 MG tablet Take 40 mg by mouth daily.   pioglitazone (ACTOS) 15 MG tablet TAKE 1 TABLET BY MOUTH EVERY DAY   rOPINIRole (REQUIP) 0.25 MG tablet Take by mouth daily.    rosuvastatin (CRESTOR) 20 MG tablet TAKE 1 TABLET BY MOUTH EVERY DAY   Saw Palmetto, Serenoa repens, (SAW PALMETTO BERRY) 160 MG CAPS Take 2 tablets by mouth daily.     Semaglutide,0.25 or 0.'5MG'$ /DOS, (OZEMPIC, 0.25 OR 0.5 MG/DOSE,) 2 MG/1.5ML SOPN Inject 0.5 mg into the skin once a week.   tadalafil (CIALIS) 5 MG tablet Take 1 tablet (5 mg total) by mouth daily.   telmisartan (MICARDIS) 40 MG tablet TAKE 1 TABLET BY MOUTH EVERY DAY   tiZANidine (ZANAFLEX) 4 MG tablet TAKE 1 TABLET BY MOUTH EVERYDAY AT BEDTIME   traMADol (ULTRAM) 50 MG tablet TAKE 1 TABLET BY MOUTH 3 TIMES A DAY AS NEEDED FOR BACK OR LEG PAIN   valACYclovir (VALTREX) 1000 MG tablet Take 1 tablet (1,000 mg total) by mouth 3 (three) times daily.   cyproheptadine (PERIACTIN) 4 MG tablet TAKE ONE TABLET 3 HOURS PRIOR TO INTERCOURSE   [DISCONTINUED] sildenafil (REVATIO) 20 MG tablet Take 20 mg by mouth daily. (Patient not taking: Reported on 11/13/2021)   [DISCONTINUED] sildenafil (VIAGRA) 100 MG tablet Take 100 mg by mouth daily as needed for erectile dysfunction. (Patient not taking: Reported on 11/13/2021)   [DISCONTINUED] tadalafil (CIALIS) 20 MG tablet TAKE 0.5-1 TABLET BY MOUTH EVERY OTHER DAY AS NEEDED FOR ERECTILE DYSFUNCTION (Patient not taking: Reported on 11/13/2021)   No facility-administered medications prior to visit.    Review of Systems  Constitutional:  Negative for appetite change, chills and fever.  Respiratory:  Negative for chest tightness, shortness of breath and wheezing.   Cardiovascular:  Negative  for chest pain and palpitations.  Gastrointestinal:  Negative for abdominal pain, nausea and vomiting.      Objective    BP 121/71 (BP Location: Right Arm, Patient Position: Sitting, Cuff Size: Large)   Pulse 87   Resp 16   Wt 217 lb (98.4 kg)   SpO2 93%   BMI 28.63 kg/m    Physical Exam Vitals reviewed.  Constitutional:      General: He is not in acute distress.    Appearance: He is well-developed.  HENT:     Head: Normocephalic and atraumatic.     Right Ear: Hearing normal.     Left Ear: Hearing normal.     Nose: Nose normal.  Eyes:     General: Lids are normal.  No scleral icterus.       Right eye: No discharge.        Left eye: No discharge.     Conjunctiva/sclera: Conjunctivae normal.  Cardiovascular:     Rate and Rhythm: Normal rate and regular rhythm.     Heart sounds: Normal heart sounds.  Pulmonary:     Effort: Pulmonary effort is normal. No respiratory distress.  Abdominal:     Palpations: Abdomen is soft.     Tenderness: There is abdominal tenderness.     Comments: Tenderness on the left lower  quadrant without guarding or rebound  Skin:    Findings: No lesion or rash.  Neurological:     General: No focal deficit present.     Mental Status: He is alert and oriented to person, place, and time.  Psychiatric:        Mood and Affect: Mood normal.        Speech: Speech normal.        Behavior: Behavior normal.        Thought Content: Thought content normal.        Judgment: Judgment normal.      No results found for any visits on 11/13/21.  Assessment & Plan     1. Generalized abdominal pain Patient is not a complainer so with worsening pain will obtain CT scan. - CT Abdomen Pelvis W Contrast  2. Diverticulitis Doxycycline to cover for possible diverticulitis pending CT - CT Abdomen Pelvis W Contrast   No follow-ups on file.      I, Manuel Durie, MD, have reviewed all documentation for this visit. The documentation on 11/14/21 for the exam, diagnosis, procedures, and orders are all accurate and complete.    Manuel Roseland Cranford Mon, MD  [facility] [phone] (phone) [phone] (fax)  [place]

## 2021-11-14 ENCOUNTER — Ambulatory Visit
Admission: RE | Admit: 2021-11-14 | Discharge: 2021-11-14 | Disposition: A | Discharge status: Home / Self Care | Payer: HMO | Source: Ambulatory Visit | Attending: Family Medicine | Admitting: Family Medicine

## 2021-11-14 ENCOUNTER — Other Ambulatory Visit: Payer: Self-pay | Admitting: *Deleted

## 2021-11-14 DIAGNOSIS — R1084 Generalized abdominal pain: Secondary | ICD-10-CM | POA: Insufficient documentation

## 2021-11-14 DIAGNOSIS — K5792 Diverticulitis of intestine, part unspecified, without perforation or abscess without bleeding: Secondary | ICD-10-CM | POA: Insufficient documentation

## 2021-11-14 DIAGNOSIS — R1032 Left lower quadrant pain: Secondary | ICD-10-CM

## 2021-11-14 MED ORDER — IOHEXOL 300 MG/ML  SOLN
80.0000 mL | Freq: Once | INTRAMUSCULAR | Status: AC | PRN
  Administered 2021-11-14: 80 mL via INTRAVENOUS

## 2021-11-15 ENCOUNTER — Ambulatory Visit
Admission: RE | Admit: 2021-11-15 | Discharge: 2021-11-15 | Disposition: A | Discharge status: Home / Self Care | Payer: HMO | Source: Ambulatory Visit | Attending: Otolaryngology | Admitting: Otolaryngology

## 2021-11-15 DIAGNOSIS — R059 Cough, unspecified: Secondary | ICD-10-CM | POA: Diagnosis not present

## 2021-11-15 DIAGNOSIS — R1314 Dysphagia, pharyngoesophageal phase: Secondary | ICD-10-CM | POA: Insufficient documentation

## 2021-11-15 DIAGNOSIS — R1313 Dysphagia, pharyngeal phase: Secondary | ICD-10-CM | POA: Insufficient documentation

## 2021-11-15 DIAGNOSIS — R1310 Dysphagia, unspecified: Secondary | ICD-10-CM | POA: Insufficient documentation

## 2021-11-15 NOTE — Therapy (Signed)
[facility] [place], [place], [address] Phone: [phone]   Fax:     Modified Barium Swallow  Patient Details  Name: Manuel Stafford. MRN: [id number] Date of Birth: October 22, 1945 No data recorded  Encounter Date: 11/15/2021   End of Session - 11/15/21 1819     Visit Number 1    Number of Visits 1    Date for SLP Re-Evaluation 11/15/21    SLP Start Time 1250    SLP Stop Time  1350    SLP Time Calculation (min) 60 min    Activity Tolerance Patient tolerated treatment well             Past Medical History:  Diagnosis Date   Back pain    lower   Diabetes mellitus without complication ([place])    Patient takes Metformin.   Elevated lipids    GERD (gastroesophageal reflux disease)    Hemorrhoids 2017   Hyperlipidemia    Hypertension    Kidney stone 2017   Parkinsonian features     Past Surgical History:  Procedure Laterality Date   CIRCUMCISION     COLONOSCOPY  2007   COLONOSCOPY WITH PROPOFOL N/A 08/22/2016   Procedure: COLONOSCOPY WITH PROPOFOL;  Surgeon: Christene Lye, MD;  Location: [facility];  Service: Endoscopy;  Laterality: N/A;   CYSTOSCOPY WITH STENT PLACEMENT Right 09/18/2015   Procedure: CYSTOSCOPY WITH STENT PLACEMENT;  Surgeon: Hollice Espy, MD;  Location: [facility];  Service: Urology;  Laterality: Right;   CYSTOSCOPY WITH URETEROSCOPY, STONE BASKETRY AND STENT PLACEMENT Right 10/03/2015   Procedure: CYSTOSCOPY WITH URETEROSCOPY, STONE BASKETRY AND STENT PLACEMENT;  Surgeon: Hollice Espy, MD;  Location: [facility];  Service: Urology;  Laterality: Right;   EXTRACORPOREAL SHOCK WAVE LITHOTRIPSY Right 09/15/2015   Procedure: EXTRACORPOREAL SHOCK WAVE LITHOTRIPSY (ESWL);  Surgeon: Nickie Retort, MD;  Location: [facility];  Service: Urology;  Laterality: Right;   HERNIA REPAIR     ROTATOR CUFF REPAIR Right 06/2012    There were no vitals filed for this visit.      11/15/21 1700  SLP Visit Information  SLP Received On 11/15/21  Subjective  Subjective Pt reports he coughs with every meal, coughs up pieces of solid food  Patient/Family Stated Goal figure out why he's having trouble swallowing  Pain Assessment  Pain Assessment No/denies pain  General Information  Date of Onset 11/15/21 (pt reports onset of symptoms ~2 years ago)  HPI Patient is a 76 y.o. male with past medical history including GERD, HTN, DM II, dyslipidemia referrred for MBS by ENT Dr. Carloyn Manner. Pt with recent hospital admission for  sepsis due to pneumonia, d/c 10/31/21. CXR 10/29/21: "Opacity in 1 of the lung bases posteriorly only seen on the lateral view. Pneumonia not excluded."  Type of Study MBS-Modified Barium Swallow Study  Previous Swallow Assessment none on file  Diet Prior to this Study Regular;Thin liquids  Temperature Spikes Noted No  Respiratory Status Room air  History of Recent Intubation No  Behavior/Cognition Alert;Cooperative;Pleasant mood  Oral Cavity Assessment WFL  Oral Care Completed by SLP No  Oral Cavity - Dentition Adequate natural dentition  Vision Functional for self feeding  Self-Feeding Abilities Able to feed self  Patient Positioning Upright in chair  Baseline Vocal Quality Normal  Volitional Cough Strong  Volitional Swallow Able to elicit  Anatomy Other (Comment) ("Bulky anterior bridging osteophyte at C3-4 and C4-5" per radiologist)  Oral Motor/Sensory Function  Overall Oral Motor/Sensory Function WFL  Oral Preparation/Oral Phase  Oral Phase WFL  Pharyngeal Phase  Pharyngeal Phase Impaired  Pharyngeal - Nectar  Pharyngeal- Nectar Teaspoon Reduced epiglottic inversion;Reduced airway/laryngeal closure;Penetration/Aspiration during swallow;Penetration/Apiration after swallow;Pharyngeal residue - pyriform;Pharyngeal residue - cp segment  Pharyngeal Material enters airway, CONTACTS cords and then ejected out  Pharyngeal- Nectar Cup Reduced  epiglottic inversion;Reduced airway/laryngeal closure;Penetration/Aspiration during swallow;Penetration/Apiration after swallow;Pharyngeal residue - pyriform;Pharyngeal residue - cp segment  Pharyngeal Material enters airway, CONTACTS cords and then ejected out  Pharyngeal - Thin  Pharyngeal- Thin Teaspoon Reduced epiglottic inversion;Reduced airway/laryngeal closure;Penetration/Aspiration during swallow;Penetration/Apiration after swallow;Pharyngeal residue - pyriform;Pharyngeal residue - cp segment  Pharyngeal Material enters airway, CONTACTS cords and then ejected out  Pharyngeal- Thin Cup Reduced epiglottic inversion;Reduced airway/laryngeal closure;Penetration/Aspiration during swallow;Penetration/Apiration after swallow;Pharyngeal residue - pyriform;Pharyngeal residue - cp segment  Pharyngeal Material enters airway, CONTACTS cords and then ejected out;Material enters airway, passes BELOW cords then ejected out (aspiration of residue x1)  Pharyngeal - Solids  Pharyngeal- Puree Reduced epiglottic inversion;Reduced airway/laryngeal closure;Penetration/Aspiration during swallow;Penetration/Apiration after swallow;Pharyngeal residue - pyriform;Pharyngeal residue - cp segment  Pharyngeal Material enters airway, CONTACTS cords and then ejected out  Pharyngeal- Regular Reduced epiglottic inversion;Reduced airway/laryngeal closure;Penetration/Aspiration during swallow;Penetration/Apiration after swallow;Pharyngeal residue - pyriform;Pharyngeal residue - cp segment  Pharyngeal Material enters airway, CONTACTS cords and then ejected out  Cervical Esophageal Phase  Cervical Esophageal Phase Impaired  Cervical Esophageal Phase - Nectar  Nectar Cup Reduced cricopharyngeal relaxation;Prominent cricopharyngeal segment  Cervical Esophageal Phase - Thin  Thin Cup Reduced cricopharyngeal relaxation;Prominent cricopharyngeal segment  Cervical Esophageal Phase - Solids  Puree Reduced cricopharyngeal  relaxation;Prominent cricopharyngeal segment  Regular Reduced cricopharyngeal relaxation;Prominent cricopharyngeal segment  Clinical Impression  Clinical Impression  Patient presents with severe structural pharyngeal and pharyngoesophageal dysphagia, and moderate-to-severe aspiration risk.  Oral stage is within normal limits. Pharyngeal swallow inititiation occurs at the level of the base of tongue or valleculae. Base of tongue retraction, hyolaryngeal excursion and pharyngeal strength appear within normal limits, but due to structural changes pt is unable to fully strip bolus through the pharynx and pharyngoesophageal segment. Per radiologist: "Bulky anterior bridging osteophyte at C3-4 and C4-5." These osteophytes protrude into the pharyngeal space and impede epiglottic deflection as well as create narrowing, reduced opening of the pharyngoesophageal segment.  Pt clears 30-70% of each bolus (residue worse with thicker consistencies), with severe residue remaining in the pyriform sinuses. Deep laryngeal penetration occurs during the swallow with all consistencies, and there is also deep laryngeal penetration to the level of the vocal cords of residue post-swallow. Several maneuvers and repositioning were attempted including: chin tuck, head turn L and R, partially reclining, Mendelsohn maneuver, supraglottic swallow, texture alterations, without observable improvements in function or reduction in residue/penetration. Fortunately, pt is sensate to penetration and one instance of aspiration, which occurred from residue between swallows. Pt has an effective cough and is able to clear the airway, however has repeated penetration as he is unable to move ejected material through the lower pharynx with subsequent swallow. Most effective strategy was for pt to swallow repeatedly, immediate cough/clear throat, reswallow, and then forcefully eject or "hock" residue to expectorate. The pharyngoesophageal segment is  prominent, however given position/prominence of osteophyte, question whether dilation would yield functional benefit; GI consult is recommended for further assessment. Neurosurgery consult may also be considered. Patient reports symptoms ongoing over 2 years, recent PNA admission earlier this month, but otherwise denies pulmonary complications. Education provided to pt regarding severity of aspiration risk, choking, as well  as pt's increased likelihood of developing aspiration pneumonia during periods of acute illness with depletion of functional reserve. Given level of effort required  (4-5 swallows, cough, reswallow, hock for each bolus), anticipate pt may have difficulty maintaining adequate nutrition/hydration. Patient does appear to have adequate cognition and ability to return demonstration of strategies, which may reduce but do not eliminate aspiration risk. If pt elects to continue oral diet accepting of aspiration risk, recommend mechanical soft diet with chopped or ground meats (well moistened), thin liquids with multiple swallows, immediate throat clear, reswallow, "hock" to expectorate residue. Advised meds crushed due to potential for choking, however pt reports he swallows whole.     SLP Visit Diagnosis Dysphagia, pharyngeal phase (R13.13);Dysphagia, pharyngoesophageal phase (R13.14)  Impact on safety and function Moderate aspiration risk;Severe aspiration risk;Risk for inadequate nutrition/hydration  Swallow Evaluation Recommendations  Recommended Consults Consider GI evaluation;Other (Comment) (consider neurosurgery consult)  SLP Diet Recommendations Other (Comment) (pt is at risk for aspiration of any consistency)  Treatment Plan  Oral Care Recommendations Oral care before and after PO  Treatment Recommendations Other (Comment) (consider repeat MBS if GI or neurosurgery intervention completed)  Prognosis  Prognosis for Safe Diet Advancement Guarded  Barriers to Reach Goals Severity of  deficits  Individuals Consulted  Consulted and Agree with Results and Recommendations Patient  Report Sent to  Referring physician  Progression Toward Goals  Progression toward goals Goals met, education completed, patient discharged from SLP  SLP Time Calculation  SLP Start Time (ACUTE ONLY) 1250  SLP Stop Time (ACUTE ONLY) 1350  SLP Time Calculation (min) (ACUTE ONLY) 60 min  SLP Evaluations  $ SLP Speech Visit 1 Visit  SLP Evaluations  $Outpatient MBS Swallow 1 Procedure  $Swallowing Treatment 1 Procedure         Dysphagia, pharyngeal phase  Dysphagia, pharyngoesophageal phase        Problem List Patient Active Problem List   Diagnosis Date Noted   Sepsis due to pneumonia ([place]) 10/29/2021   Dyslipidemia 10/29/2021   Acute kidney injury superimposed on chronic kidney disease ([place]) 10/29/2021   Type 2 diabetes mellitus without complications ([place]) 36/11/7701   Long term (current) use of non-steroidal anti-inflammatories ([place]) 07/13/2019   Shoulder pain 03/21/2016   Hydronephrosis with urinary obstruction due to ureteral calculus 09/26/2015   Microscopic hematuria 09/26/2015   Hypertensive left ventricular hypertrophy 09/13/2015   Nephrolithiasis 08/29/2015   Right ureteral stone 08/29/2015   Abnormal ECG 08/22/2015   Benign essential HTN 08/22/2015   Combined fat and carbohydrate induced hyperlipemia 08/22/2015   Breathlessness on exertion 08/22/2015   Lung nodules 04/28/2015   Allergic rhinitis 12/31/2014   Diabetes ([place]) 12/31/2014   Erectile dysfunction 12/31/2014   Fatty liver disease, nonalcoholic 40/35/2481   Acid reflux 12/31/2014   BP (high blood pressure) 12/31/2014   HLD (hyperlipidemia) 12/31/2014   Lumbosacral spondylosis without myelopathy 12/31/2014   Adiposity 12/31/2014   Herpes zona 12/31/2014   Disorder of shoulder 12/31/2014   Apnea, sleep 12/31/2014   Avitaminosis D 12/31/2014   DDD (degenerative disc disease), lumbar 11/30/2013    Back ache 11/30/2013   Deneise Lever, MS, CCC-SLP Speech-Language Pathologist [phone]   Aliene Altes, [place] 11/15/2021, 6:23 PM  [facility] [place], [place], [address] Phone: [phone]   Fax:     Name: Manuel Stafford. MRN: [id number] Date of Birth: 07/25/1945

## 2021-11-28 DIAGNOSIS — R1032 Left lower quadrant pain: Secondary | ICD-10-CM | POA: Diagnosis not present

## 2021-11-30 ENCOUNTER — Ambulatory Visit (INDEPENDENT_AMBULATORY_CARE_PROVIDER_SITE_OTHER): Payer: HMO | Admitting: Family Medicine

## 2021-11-30 ENCOUNTER — Other Ambulatory Visit: Payer: Self-pay | Admitting: Family Medicine

## 2021-11-30 ENCOUNTER — Encounter: Payer: Self-pay | Admitting: Family Medicine

## 2021-11-30 VITALS — BP 116/65 | HR 69 | Resp 16 | Ht 73.0 in | Wt 222.0 lb

## 2021-11-30 DIAGNOSIS — G4733 Obstructive sleep apnea (adult) (pediatric): Secondary | ICD-10-CM

## 2021-11-30 DIAGNOSIS — Z6830 Body mass index (BMI) 30.0-30.9, adult: Secondary | ICD-10-CM

## 2021-11-30 DIAGNOSIS — E1169 Type 2 diabetes mellitus with other specified complication: Secondary | ICD-10-CM

## 2021-11-30 DIAGNOSIS — K760 Fatty (change of) liver, not elsewhere classified: Secondary | ICD-10-CM | POA: Diagnosis not present

## 2021-11-30 DIAGNOSIS — E119 Type 2 diabetes mellitus without complications: Secondary | ICD-10-CM

## 2021-11-30 DIAGNOSIS — Z0000 Encounter for general adult medical examination without abnormal findings: Secondary | ICD-10-CM

## 2021-11-30 DIAGNOSIS — E7849 Other hyperlipidemia: Secondary | ICD-10-CM

## 2021-11-30 DIAGNOSIS — E6609 Other obesity due to excess calories: Secondary | ICD-10-CM

## 2021-11-30 MED ORDER — GLUCOSE BLOOD VI STRP: 1.0000 | ORAL_STRIP | Freq: Every day | 4 refills | Status: AC

## 2021-11-30 MED ORDER — OZEMPIC (0.25 OR 0.5 MG/DOSE) 2 MG/1.5ML ~~LOC~~ SOPN: 0.5000 mg | PEN_INJECTOR | SUBCUTANEOUS | 5 refills | Status: DC

## 2021-11-30 NOTE — Patient Instructions (Addendum)
RESTART LOW DOSE OZEMPIC.

## 2021-11-30 NOTE — Progress Notes (Signed)
Annual Wellness Visit    I,[name],acting as a scribe for Manuel Durie, MD.,have documented all relevant documentation on the behalf of Manuel Durie, MD,as directed by  Manuel Durie, MD while in the presence of Manuel Durie, MD.   Patient: Manuel Stafford., Male    DOB: 11/05/1945, 76 y.o.   MRN: [id number] Visit Date: 11/30/2021  Today's Provider: [name], MD   Chief Complaint  Patient presents with   Medicare Wellness   Subjective    Manuel Stafford. is a 76 y.o. male who presents today for his Annual Wellness Visit.CPE He reports consuming a general diet. Home exercise routine includes walking. He generally feels well. He reports sleeping fairly well. He does not have additional problems to discuss today.   HPI    Medications: Outpatient Medications Prior to Visit  Medication Sig   aspirin 81 MG tablet Take 81 mg by mouth daily.    cetirizine (ZYRTEC) 10 MG tablet TAKE 1 TABLET BY MOUTH EVERY DAY   Cholecalciferol (VITAMIN D3) 2000 UNITS capsule Take 2,000 Units by mouth daily.    Continuous Blood Gluc Sensor (FREESTYLE LIBRE 2 SENSOR) MISC USE AS DIRECTED. CONTINUOUS BLOOD SENSOR.   hydrochlorothiazide (HYDRODIURIL) 25 MG tablet Take 1 tablet (25 mg total) by mouth daily.   metFORMIN (GLUCOPHAGE) 1000 MG tablet TAKE 1 TABLET (1,000 MG TOTAL) BY MOUTH 2 (TWO) TIMES DAILY WITH A MEAL.   MULTIPLE VITAMIN PO Take by mouth daily.    pantoprazole (PROTONIX) 40 MG tablet Take 40 mg by mouth daily.   pioglitazone (ACTOS) 15 MG tablet TAKE 1 TABLET BY MOUTH EVERY DAY   rOPINIRole (REQUIP) 0.25 MG tablet Take by mouth daily.    rosuvastatin (CRESTOR) 20 MG tablet TAKE 1 TABLET BY MOUTH EVERY DAY   Saw Palmetto, Serenoa repens, (SAW PALMETTO BERRY) 160 MG CAPS Take 2 tablets by mouth daily.    Semaglutide,0.25 or 0.'5MG'$ /DOS, (OZEMPIC, 0.25 OR 0.5 MG/DOSE,) 2 MG/1.5ML SOPN Inject 0.5 mg into the skin once a week.   tadalafil  (CIALIS) 5 MG tablet Take 1 tablet (5 mg total) by mouth daily.   telmisartan (MICARDIS) 40 MG tablet TAKE 1 TABLET BY MOUTH EVERY DAY   tiZANidine (ZANAFLEX) 4 MG tablet TAKE 1 TABLET BY MOUTH EVERYDAY AT BEDTIME   traMADol (ULTRAM) 50 MG tablet TAKE 1 TABLET BY MOUTH 3 TIMES A DAY AS NEEDED FOR BACK OR LEG PAIN   valACYclovir (VALTREX) 1000 MG tablet Take 1 tablet (1,000 mg total) by mouth 3 (three) times daily.   cyproheptadine (PERIACTIN) 4 MG tablet TAKE ONE TABLET 3 HOURS PRIOR TO INTERCOURSE   [DISCONTINUED] doxycycline (VIBRA-TABS) 100 MG tablet Take 1 tablet (100 mg total) by mouth 2 (two) times daily. (Patient not taking: Reported on 11/30/2021)   [DISCONTINUED] guaiFENesin (MUCINEX) 600 MG 12 hr tablet Take 1 tablet (600 mg total) by mouth 2 (two) times daily. (Patient not taking: Reported on 11/30/2021)   No facility-administered medications prior to visit.    No Known Allergies  Patient Care Team: [name], MD as PCP - General (Family Medicine) Thelma Comp, [place] as Consulting Physician (Optometry) Corey Skains, MD as Consulting Physician (Cardiology) Ralene Bathe, MD as Consulting Physician (Dermatology) Hollice Espy, MD as Consulting Physician (Urology) Barbette Merino, NP as Nurse Practitioner (Nurse Practitioner) Vladimir Crofts, MD as Consulting Physician (Neurology) Sunday Corn, Mervin Kung (Neurology) Caroline More, DPM as Consulting Physician (  Podiatry)  Review of Systems  All other systems reviewed and are negative.   Last hemoglobin A1c Lab Results  Component Value Date   HGBA1C 6.0 (H) 11/07/2021        Objective    Vitals: BP 116/65 (BP Location: Right Arm, Patient Position: Sitting, Cuff Size: Large)   Pulse 69   Resp 16   Ht '6\' 1"'$  (1.854 m)   Wt 222 lb (100.7 kg)   SpO2 97%   BMI 29.29 kg/m  BP Readings from Last 3 Encounters:  11/30/21 116/65  11/13/21 121/71  11/07/21 103/62   Wt Readings from Last 3 Encounters:   11/30/21 222 lb (100.7 kg)  11/13/21 217 lb (98.4 kg)  11/07/21 218 lb (98.9 kg)       Physical Exam Vitals reviewed.  Constitutional:      Appearance: He is well-developed. He is obese.  HENT:     Head: Normocephalic and atraumatic.     Right Ear: External ear normal.     Left Ear: External ear normal.     Nose: Nose normal.  Eyes:     Conjunctiva/sclera: Conjunctivae normal.     Pupils: Pupils are equal, round, and reactive to light.  Cardiovascular:     Rate and Rhythm: Normal rate and regular rhythm.     Heart sounds: Normal heart sounds.  Pulmonary:     Effort: Pulmonary effort is normal.     Breath sounds: Normal breath sounds.  Abdominal:     General: Bowel sounds are normal.     Palpations: Abdomen is soft.  Genitourinary:    Penis: Normal.      Testes: Normal.     Prostate: Normal.     Rectum: Normal.  Musculoskeletal:        General: Normal range of motion.     Cervical back: Normal range of motion and neck supple.  Skin:    General: Skin is warm and dry.  Neurological:     General: No focal deficit present.     Mental Status: He is alert and oriented to person, place, and time.  Psychiatric:        Mood and Affect: Mood normal.        Behavior: Behavior normal.        Thought Content: Thought content normal.        Judgment: Judgment normal.      Most recent functional status assessment:    10/29/2021   10:18 PM  In your present state of health, do you have any difficulty performing the following activities:  Doing errands, shopping? 0   Most recent fall risk assessment:    04/19/2021    1:32 PM  [place] in the past year? 0  Number falls in past yr: 0  Injury with Fall? 0    Most recent depression screenings:    04/19/2021    1:32 PM 11/15/2020    3:39 PM  PHQ 2/9 Scores  PHQ - 2 Score 3 0  PHQ- 9 Score 6 2   Most recent cognitive screening:    07/24/2016    2:19 PM  6CIT Screen  What Year? 0 points  What month? 0  points  What time? 0 points  Count back from 20 0 points  Months in reverse 0 points  Repeat phrase 2 points  Total Score 2 points   Most recent Audit-C alcohol use screening    04/19/2021    1:32 PM  Alcohol Use Disorder Test (AUDIT)  1. How often do you have a drink containing alcohol? 0  2. How many drinks containing alcohol do you have on a typical day when you are drinking? 0  3. How often do you have six or more drinks on one occasion? 0  AUDIT-C Score 0   A score of 3 or more in women, and 4 or more in men indicates increased risk for alcohol abuse, EXCEPT if all of the points are from question 1   No results found for any visits on 11/30/21.  Assessment & Plan     Annual wellness visit done today including the all of the following: Reviewed patient's Family Medical History Reviewed and updated list of patient's medical providers Assessment of cognitive impairment was done Assessed patient's functional ability Established a written schedule for health screening [place] Completed and Reviewed  Exercise Activities and Dietary recommendations  Goals      DIET - INCREASE WATER INTAKE     Recommend to drink at least 6-8 8oz glasses of water per day.     Prevent falls     Recommend to remove any items from the home that may cause slips or trips.        Immunization History  Administered Date(s) Administered   Fluad Quad(high Dose 65+) 03/11/2019, 03/14/2020, 04/12/2021   Hepatitis A 08/13/2006   Influenza, High Dose Seasonal PF 03/23/2015, 03/21/2016, 03/21/2017, 04/09/2018   PFIZER(Purple Top)SARS-COV-2 Vaccination 08/07/2019, 09/01/2019, 04/28/2020   Pfizer Covid-19 Vaccine Bivalent Booster 31yr & up 04/18/2021   Pneumococcal Conjugate-13 05/25/2014   Pneumococcal Polysaccharide-23 04/08/2012   Tdap 10/26/2005   Zoster, Live 08/13/2006, 04/17/2013    Health Maintenance  Topic Date Due   Zoster Vaccines- Shingrix (1 of 2) Never done    TETANUS/TDAP  10/27/2015   FOOT EXAM  07/12/2020   INFLUENZA VACCINE  01/23/2022   HEMOGLOBIN A1C  05/10/2022   OPHTHALMOLOGY EXAM  07/05/2022   Pneumonia Vaccine 76 Years old  Completed   COVID-19 Vaccine  Completed   Hepatitis C Screening  Completed   HPV VACCINES  Aged Out   COLONOSCOPY (Pts 45-467yrInsurance coverage will need to be confirmed)  Discontinued     Discussed health benefits of physical activity, and encouraged him to engage in regular exercise appropriate for his age and condition.    1. Encounter for Medicare annual wellness exam   2. Annual physical exam   3. Type 2 diabetes mellitus without complication, unspecified whether long term insulin use (HCC) Restart low-dose Ozempic - glucose blood test strip; 1 each by Other route daily. Use as instructed  Dispense: 100 each; Refill: 4  4. Type 2 diabetes mellitus with other specified complication, without long-term current use of insulin ([place]  5. Obstructive sleep apnea syndrome   6. Fatty liver disease, nonalcoholic   7. Other hyperlipidemia   8. Class 1 obesity due to excess calories with serious comorbidity and body mass index (BMI) of 30.0 to 30.9 in adult    Return in about 4 months (around 04/01/2022).     I, RiWilhemena DurieMD, have reviewed all documentation for this visit. The documentation on 12/09/21 for the exam, diagnosis, procedures, and orders are all accurate and complete.    Kapil Petropoulos GiCranford MonMD  [phone]) [phone])  [place]

## 2021-11-30 NOTE — Telephone Encounter (Signed)
Requested medication (s) are due for refill today: no  Requested medication (s) are on the active medication list: yes  Last refill:  today  Future visit scheduled: yes  Notes to clinic:  Product Backordered/Unavailable:2 MG/1.5 ML PRODUCT NO LONGER ON MARKET. PLEASE RESEND RX FOR 2 MG/3 ML PRODUCT.     Requested Prescriptions  Pending Prescriptions Disp Refills   OZEMPIC, 0.25 OR 0.5 MG/DOSE, 2 MG/3ML SOPN [Pharmacy Med Name: OZEMPIC 0.25-0.5 MG/DOSE PEN]  0    Sig: Please specify directions, refills and quantity     There is no refill protocol information for this order

## 2021-12-12 DIAGNOSIS — J84114 Acute interstitial pneumonitis: Secondary | ICD-10-CM | POA: Diagnosis not present

## 2021-12-12 DIAGNOSIS — R0789 Other chest pain: Secondary | ICD-10-CM | POA: Diagnosis not present

## 2021-12-12 DIAGNOSIS — R918 Other nonspecific abnormal finding of lung field: Secondary | ICD-10-CM | POA: Diagnosis not present

## 2021-12-12 DIAGNOSIS — Z20822 Contact with and (suspected) exposure to covid-19: Secondary | ICD-10-CM | POA: Diagnosis not present

## 2021-12-12 DIAGNOSIS — R0600 Dyspnea, unspecified: Secondary | ICD-10-CM | POA: Diagnosis not present

## 2021-12-13 DIAGNOSIS — Z20822 Contact with and (suspected) exposure to covid-19: Secondary | ICD-10-CM | POA: Diagnosis not present

## 2021-12-13 DIAGNOSIS — R0789 Other chest pain: Secondary | ICD-10-CM | POA: Diagnosis not present

## 2021-12-13 DIAGNOSIS — J84114 Acute interstitial pneumonitis: Secondary | ICD-10-CM | POA: Diagnosis not present

## 2021-12-13 NOTE — Progress Notes (Unsigned)
Established patient visit  I,Jolayne Branson,acting as a scribe for [name], MD.,have documented all relevant documentation on the behalf of [name], MD,as directed by  [name], MD while in the presence of [name], MD.   Patient: Manuel Stafford.   DOB: 08-12-1945   76 y.o. Male  MRN: [id number] Visit Date: 12/14/2021  Today's healthcare provider: [name], MD   Chief Complaint  Patient presents with   Hospitalization Follow-up   Subjective    HPI  Follow up ER visit  Patient was seen in ER for Pneumonia on 12/12/2021. He was treated for Pneumonia. Treatment for this included; patient was given prednisone, zpack, and albuterol inhaler. He reports good compliance with treatment. He reports this condition is Improved.  -----------------------------------------------------------------------------------------  -   Medications: Outpatient Medications Prior to Visit  Medication Sig   aspirin 81 MG tablet Take 81 mg by mouth daily.    cetirizine (ZYRTEC) 10 MG tablet TAKE 1 TABLET BY MOUTH EVERY DAY   Cholecalciferol (VITAMIN D3) 2000 UNITS capsule Take 2,000 Units by mouth daily.    Continuous Blood Gluc Sensor (FREESTYLE LIBRE 2 SENSOR) MISC USE AS DIRECTED. CONTINUOUS BLOOD SENSOR.   glucose blood test strip 1 each by Other route daily. Use as instructed   hydrochlorothiazide (HYDRODIURIL) 25 MG tablet Take 1 tablet (25 mg total) by mouth daily.   metFORMIN (GLUCOPHAGE) 1000 MG tablet TAKE 1 TABLET (1,000 MG TOTAL) BY MOUTH 2 (TWO) TIMES DAILY WITH A MEAL.   MULTIPLE VITAMIN PO Take by mouth daily.    OZEMPIC, 0.25 OR 0.5 MG/DOSE, 2 MG/3ML SOPN Please specify directions, refills and quantity   pantoprazole (PROTONIX) 40 MG tablet Take 40 mg by mouth daily.   pioglitazone (ACTOS) 15 MG tablet TAKE 1 TABLET BY MOUTH EVERY DAY   rOPINIRole (REQUIP) 0.25 MG tablet Take by mouth daily.    rosuvastatin (CRESTOR) 20 MG  tablet TAKE 1 TABLET BY MOUTH EVERY DAY   Saw Palmetto, Serenoa repens, (SAW PALMETTO BERRY) 160 MG CAPS Take 2 tablets by mouth daily.    tadalafil (CIALIS) 5 MG tablet Take 1 tablet (5 mg total) by mouth daily.   telmisartan (MICARDIS) 40 MG tablet TAKE 1 TABLET BY MOUTH EVERY DAY   tiZANidine (ZANAFLEX) 4 MG tablet TAKE 1 TABLET BY MOUTH EVERYDAY AT BEDTIME   traMADol (ULTRAM) 50 MG tablet TAKE 1 TABLET BY MOUTH 3 TIMES A DAY AS NEEDED FOR BACK OR LEG PAIN   valACYclovir (VALTREX) 1000 MG tablet Take 1 tablet (1,000 mg total) by mouth 3 (three) times daily.   [DISCONTINUED] cyproheptadine (PERIACTIN) 4 MG tablet TAKE ONE TABLET 3 HOURS PRIOR TO INTERCOURSE   No facility-administered medications prior to visit.    Review of Systems  {Labs  Heme  Chem  Endocrine  Serology  Results Review (optional):23779}   Objective    BP 116/68 (BP Location: Right Arm, Patient Position: Sitting, Cuff Size: Large)   Pulse 79   Resp 16   Wt 222 lb (100.7 kg)   SpO2 94%   BMI 29.29 kg/m  {Show previous vital signs (optional):23777}  Physical Exam  ***  No results found for any visits on 12/14/21.  Assessment & Plan     1. Pneumonitis  - Ambulatory referral to Pulmonology   Return in about 2 weeks (around 12/28/2021).      {provider attestation***:1}   [name], MD  [facility]  [phone] (phone) [phone] (fax)  [place]

## 2021-12-14 ENCOUNTER — Ambulatory Visit (INDEPENDENT_AMBULATORY_CARE_PROVIDER_SITE_OTHER): Payer: HMO | Admitting: Family Medicine

## 2021-12-14 ENCOUNTER — Encounter: Payer: Self-pay | Admitting: Family Medicine

## 2021-12-14 VITALS — BP 116/68 | HR 79 | Resp 16 | Wt 222.0 lb

## 2021-12-14 DIAGNOSIS — G4733 Obstructive sleep apnea (adult) (pediatric): Secondary | ICD-10-CM | POA: Diagnosis not present

## 2021-12-14 DIAGNOSIS — J189 Pneumonia, unspecified organism: Secondary | ICD-10-CM | POA: Diagnosis not present

## 2021-12-14 DIAGNOSIS — R1310 Dysphagia, unspecified: Secondary | ICD-10-CM

## 2021-12-14 DIAGNOSIS — J690 Pneumonitis due to inhalation of food and vomit: Secondary | ICD-10-CM | POA: Diagnosis not present

## 2021-12-20 DIAGNOSIS — I700 Atherosclerosis of aorta: Secondary | ICD-10-CM | POA: Diagnosis not present

## 2021-12-20 DIAGNOSIS — I119 Hypertensive heart disease without heart failure: Secondary | ICD-10-CM | POA: Diagnosis not present

## 2021-12-20 DIAGNOSIS — I10 Essential (primary) hypertension: Secondary | ICD-10-CM | POA: Diagnosis not present

## 2021-12-20 DIAGNOSIS — I2510 Atherosclerotic heart disease of native coronary artery without angina pectoris: Secondary | ICD-10-CM | POA: Diagnosis not present

## 2021-12-20 DIAGNOSIS — I25118 Atherosclerotic heart disease of native coronary artery with other forms of angina pectoris: Secondary | ICD-10-CM | POA: Diagnosis not present

## 2021-12-20 DIAGNOSIS — E782 Mixed hyperlipidemia: Secondary | ICD-10-CM | POA: Diagnosis not present

## 2021-12-21 ENCOUNTER — Ambulatory Visit: Payer: HMO | Admitting: Pulmonary Disease

## 2021-12-21 ENCOUNTER — Encounter: Payer: Self-pay | Admitting: Pulmonary Disease

## 2021-12-21 VITALS — BP 110/62 | HR 89 | Temp 97.8°F | Ht 72.0 in | Wt 219.0 lb

## 2021-12-21 DIAGNOSIS — J690 Pneumonitis due to inhalation of food and vomit: Secondary | ICD-10-CM

## 2021-12-21 DIAGNOSIS — R1319 Other dysphagia: Secondary | ICD-10-CM

## 2021-12-21 NOTE — Progress Notes (Unsigned)
I,Manuel Stafford,acting as a scribe for Manuel Durie, MD.,have documented all relevant documentation on the behalf of Manuel Durie, MD,as directed by  Manuel Durie, MD while in the presence of Manuel Durie, MD.   Established patient visit   Patient: Manuel Stafford.   DOB: 1946-04-01   76 y.o. Male  MRN: [id number] Visit Date: 12/25/2021  Today's healthcare provider: [name], MD   Chief Complaint  Patient presents with   Follow-up   Subjective    HPI  Pneumonia Follow-up: Patient following up on aspiration pneumonitis. Patient was seen 2 weeks ago. Treatment thus far includes  IV Rocephin, Zithromax and switch to Doxy when patient was admitted.   Pulmonary said that they thought it was completely a GI and neurosurgical problem.  She did not schedule him back for follow-up. The patient has also had some vertigo symptoms recently.  Has been helped some in the past by meclizine Medications: Outpatient Medications Prior to Visit  Medication Sig   aspirin 81 MG tablet Take 81 mg by mouth daily.    cetirizine (ZYRTEC) 10 MG tablet TAKE 1 TABLET BY MOUTH EVERY DAY   Cholecalciferol (VITAMIN D3) 2000 UNITS capsule Take 2,000 Units by mouth daily.    Continuous Blood Gluc Sensor (FREESTYLE LIBRE 2 SENSOR) MISC USE AS DIRECTED. CONTINUOUS BLOOD SENSOR.   glucose blood test strip 1 each by Other route daily. Use as instructed   hydrochlorothiazide (HYDRODIURIL) 25 MG tablet Take 1 tablet (25 mg total) by mouth daily.   metFORMIN (GLUCOPHAGE) 1000 MG tablet TAKE 1 TABLET (1,000 MG TOTAL) BY MOUTH 2 (TWO) TIMES DAILY WITH A MEAL.   MULTIPLE VITAMIN PO Take by mouth daily.    OZEMPIC, 0.25 OR 0.5 MG/DOSE, 2 MG/3ML SOPN Please specify directions, refills and quantity   pantoprazole (PROTONIX) 40 MG tablet Take 40 mg by mouth daily.   pioglitazone (ACTOS) 15 MG tablet TAKE 1 TABLET BY MOUTH EVERY DAY   rOPINIRole (REQUIP) 0.25 MG tablet Take by mouth  daily.    rosuvastatin (CRESTOR) 20 MG tablet TAKE 1 TABLET BY MOUTH EVERY DAY   Saw Palmetto, Serenoa repens, (SAW PALMETTO BERRY) 160 MG CAPS Take 2 tablets by mouth daily.    tadalafil (CIALIS) 5 MG tablet Take 1 tablet (5 mg total) by mouth daily.   telmisartan (MICARDIS) 40 MG tablet TAKE 1 TABLET BY MOUTH EVERY DAY   tiZANidine (ZANAFLEX) 4 MG tablet TAKE 1 TABLET BY MOUTH EVERYDAY AT BEDTIME   traMADol (ULTRAM) 50 MG tablet TAKE 1 TABLET BY MOUTH 3 TIMES A DAY AS NEEDED FOR BACK OR LEG PAIN   valACYclovir (VALTREX) 1000 MG tablet Take 1 tablet (1,000 mg total) by mouth 3 (three) times daily.   No facility-administered medications prior to visit.    Review of Systems  Last hemoglobin A1c Lab Results  Component Value Date   HGBA1C 6.0 (H) 11/07/2021       Objective    BP 137/63 (BP Location: Right Arm, Patient Position: Sitting, Cuff Size: Large)   Pulse 82   Temp 98.9 F (37.2 C) (Oral)   Resp 16   Wt 224 lb 11.2 oz (101.9 kg)   SpO2 98%   BMI 30.47 kg/m  BP Readings from Last 3 Encounters:  12/25/21 137/63  12/21/21 110/62  12/14/21 116/68   Wt Readings from Last 3 Encounters:  12/25/21 224 lb 11.2 oz (101.9 kg)  12/21/21 219 lb (99.3 kg)  12/14/21 222 lb (100.7 kg)      Physical Exam Vitals reviewed.  Constitutional:      General: He is not in acute distress.    Appearance: He is well-developed.  HENT:     Head: Normocephalic and atraumatic.     Right Ear: Hearing normal.     Left Ear: Hearing normal.     Nose: Nose normal.  Eyes:     General: Lids are normal. No scleral icterus.       Right eye: No discharge.        Left eye: No discharge.     Conjunctiva/sclera: Conjunctivae normal.  Cardiovascular:     Rate and Rhythm: Normal rate and regular rhythm.     Heart sounds: Normal heart sounds.  Pulmonary:     Effort: Pulmonary effort is normal. No respiratory distress.  Skin:    Findings: No lesion or rash.  Neurological:     General: No focal  deficit present.     Mental Status: He is alert and oriented to person, place, and time.  Psychiatric:        Mood and Affect: Mood normal.        Speech: Speech normal.        Behavior: Behavior normal.        Thought Content: Thought content normal.        Judgment: Judgment normal.       No results found for any visits on 12/25/21.  Assessment & Plan     1. Aspiration pneumonitis [facility]) Patient has appointment with GI in neurosurgery. He is not excited about all about use and thickened liquids as his source of nutrition  2. Pharyngoesophageal dysphagia Found and documented on barium swallow  3. Closed nondisplaced fracture of third cervical vertebra, unspecified fracture morphology, sequela This occurred when he was in college playing football.  Defer to neurosurgical expertise  4. Lumbosacral spondylosis without myelopathy  - Ambulatory referral to Neurosurgery  5. Vertigo Try meclizine as needed.  I think this is benign etiology. - meclizine (ANTIVERT) 25 MG tablet; Take 1 tablet (25 mg total) by mouth 3 (three) times daily as needed for dizziness.  Dispense: 90 tablet; Refill: 1  6. Hypertensive left ventricular hypertrophy, without heart failure Good control.  7. Obstructive sleep apnea syndrome On CPAP.   No follow-ups on file.      I, Manuel Durie, MD, have reviewed all documentation for this visit. The documentation on 12/27/21 for the exam, diagnosis, procedures, and orders are all accurate and complete.    Nikoloz Huy Cranford Mon, MD  [facility] [phone] (phone) [phone] (fax)  [place]

## 2021-12-21 NOTE — Patient Instructions (Signed)
Your recurrent pneumonias because of recurrent aspiration into the lungs.  Follow the directions of the speech pathologist given at the time of your swallow evaluation.  I would recommend that you make your appointment with the neurosurgeon while you are waiting for the gastroenterology appointment.  We will see you back as needed.

## 2021-12-21 NOTE — Progress Notes (Signed)
Subjective:    Patient ID: Manuel Patella., male    DOB: [birth year], 76 y.o.   MRN: [id number] Patient Care Team: [name], MD as PCP - General (Family Medicine) Thelma Comp, [place] as Consulting Physician (Optometry) Corey Skains, MD as Consulting Physician (Cardiology) Ralene Bathe, MD as Consulting Physician (Dermatology) Hollice Espy, MD as Consulting Physician (Urology) Barbette Merino, NP as Nurse Practitioner (Nurse Practitioner) Vladimir Crofts, MD as Consulting Physician (Neurology) Sunday Corn, Criss Alvine Hubbard [place]orn (Neurology) Caroline More, DPM as Consulting Physician (Podiatry)  Chief Complaint  Patient presents with   pulmonary consult    CXR 10/29/2021--    HPI Patient is a very pleasant 76 year old very remote former smoker who presents for evaluation of recurrent pneumonia.  He is kindly referred by Dr. Miguel Aschoff.  The patient states that over the last 1 to 2 months he has had 2 bouts of pneumonia.  He does not offer a history consistent with immune deficiency.  He does note issues of longstanding with dysphagia.  He states dysphagia started around the college years when he broke his neck playing football.  He was on traction and required a brace for months.  Over the years he had been able to manage his dysphagia however more recently, over the last 6 months, this has become more pronounced.  During the interview he has to clear his throat constantly and his voice sounds wet.  Clearly having difficulty clearing secretions.  He has not had gastroesophageal reflux symptoms.  No recent fevers, chills or sweats.   He chokes frequently when he is eating. Cough occurs only when he is eating. Food particles are sometimes expelled while coughing.  He states that he was advised by the speech pathologist who performed a swallow evaluation to eat smaller bites and chew the food properly.  When he does this he does better but he forgets and her use while  eating.  No other complaints voiced concerning this issue.  He does note that he has started to feel that he has lower extremity weakness.  Of note on 15 Nov 2021 he had a barium swallow and this showed frank aspiration, to paraphrase: "There was noted bulky anterior bridging of osteophytes at C3-4 and C4-5.  There are osteophytes protrude to the pharyngeal space impeding epiglottic deflection.  These osteophytes also create narrowing reducing the opening of the pharyngoesophageal segment.  There was penetration that occurred during the swallow of ALL CONSISTENCIES.  Recommendations were for GI consult as well as neurosurgery consult."  Patient has pending neurosurgery and GI consults.  Review of Systems A 10 point review of systems was performed and it is as noted above otherwise negative.  Past Medical History:  Diagnosis Date   Back pain    lower   Diabetes mellitus without complication ([place])    Patient takes Metformin.   Elevated lipids    GERD (gastroesophageal reflux disease)    Hemorrhoids 2017   Hyperlipidemia    Hypertension    Kidney stone 2017   Parkinsonian features    Past Surgical History:  Procedure Laterality Date   CIRCUMCISION     COLONOSCOPY  2007   COLONOSCOPY WITH PROPOFOL N/A 08/22/2016   Procedure: COLONOSCOPY WITH PROPOFOL;  Surgeon: Christene Lye, MD;  Location: [facility] END[facility]COPY;  Service: Endoscopy;  Laterality: N/A;   CYSTOSCOPY WITH STENT PLACEMENT Right 09/18/2015   Procedure: CYSTOSCOPY WITH STENT PLACEMENT;  Surgeon: Hollice Espy, MD;  Location: [facility] ORS;  Service: Urology;  Laterality: Right;   CYSTOSCOPY WITH URETEROSCOPY, STONE BASKETRY AND STENT PLACEMENT Right 10/03/2015   Procedure: CYSTOSCOPY WITH URETEROSCOPY, STONE BASKETRY AND STENT PLACEMENT;  Surgeon: Hollice Espy, MD;  Location: [facility] ORS;  Service: Urology;  Laterality: Right;   EXTRACORPOREAL SHOCK WAVE LITHOTRIPSY Right 09/15/2015   Procedure: EXTRACORPOREAL SHOCK WAVE LITHOTRIPSY  (ESWL);  Surgeon: Nickie Retort, MD;  Location: [facility] ORS;  Service: Urology;  Laterality: Right;   HERNIA REPAIR     ROTATOR CUFF REPAIR Right 06/2012   Patient Active Problem List   Diagnosis Date Noted   Dyslipidemia 10/29/2021   Acute kidney injury superimposed on chronic kidney disease ([place]) 10/29/2021   Type 2 diabetes mellitus without complications ([place]) 28/78/6767   Long term (current) use of non-steroidal anti-inflammatories ([place]) 07/13/2019   Shoulder pain 03/21/2016   Hydronephrosis with urinary obstruction due to ureteral calculus 09/26/2015   Microscopic hematuria 09/26/2015   Hypertensive left ventricular hypertrophy 09/13/2015   Nephrolithiasis 08/29/2015   Right ureteral stone 08/29/2015   Abnormal ECG 08/22/2015   Benign essential HTN 08/22/2015   Combined fat and carbohydrate induced hyperlipemia 08/22/2015   Breathlessness on exertion 08/22/2015   Lung nodules 04/28/2015   Allergic rhinitis 12/31/2014   Diabetes ([place]) 12/31/2014   Erectile dysfunction 12/31/2014   Fatty liver disease, nonalcoholic 20/94/7096   Acid reflux 12/31/2014   BP (high blood pressure) 12/31/2014   HLD (hyperlipidemia) 12/31/2014   Lumbosacral spondylosis without myelopathy 12/31/2014   Adiposity 12/31/2014   Herpes zona 12/31/2014   Disorder of shoulder 12/31/2014   Apnea, sleep 12/31/2014   Avitaminosis D 12/31/2014   DDD (degenerative disc disease), lumbar 11/30/2013   Back ache 11/30/2013   Family History  Problem Relation Age of Onset   Hypertension Mother    Diabetes Father    CAD Father    Diabetes Sister    Heart disease Sister    CAD Sister    Social History   Tobacco Use   Smoking status: Former    Packs/day: 2.00    Years: 15.00    Total pack years: 30.00    Types: Cigarettes    Quit date: 06/25/1974    Years since quitting: 47.5   Smokeless tobacco: Never  Substance Use Topics   Alcohol use: No   No Known Allergies  Current Meds  Medication Sig    aspirin 81 MG tablet Take 81 mg by mouth daily.    cetirizine (ZYRTEC) 10 MG tablet TAKE 1 TABLET BY MOUTH EVERY DAY   Cholecalciferol (VITAMIN D3) 2000 UNITS capsule Take 2,000 Units by mouth daily.    Continuous Blood Gluc Sensor (FREESTYLE LIBRE 2 SENSOR) MISC USE AS DIRECTED. CONTINUOUS BLOOD SENSOR.   glucose blood test strip 1 each by Other route daily. Use as instructed   hydrochlorothiazide (HYDRODIURIL) 25 MG tablet Take 1 tablet (25 mg total) by mouth daily.   metFORMIN (GLUCOPHAGE) 1000 MG tablet TAKE 1 TABLET (1,000 MG TOTAL) BY MOUTH 2 (TWO) TIMES DAILY WITH A MEAL.   MULTIPLE VITAMIN PO Take by mouth daily.    OZEMPIC, 0.25 OR 0.5 MG/DOSE, 2 MG/3ML SOPN Please specify directions, refills and quantity   pantoprazole (PROTONIX) 40 MG tablet Take 40 mg by mouth daily.   pioglitazone (ACTOS) 15 MG tablet TAKE 1 TABLET BY MOUTH EVERY DAY   rOPINIRole (REQUIP) 0.25 MG tablet Take by mouth daily.    rosuvastatin (CRESTOR) 20 MG tablet TAKE 1 TABLET BY MOUTH EVERY DAY   Saw Palmetto,  Serenoa repens, (SAW PALMETTO BERRY) 160 MG CAPS Take 2 tablets by mouth daily.    tadalafil (CIALIS) 5 MG tablet Take 1 tablet (5 mg total) by mouth daily.   telmisartan (MICARDIS) 40 MG tablet TAKE 1 TABLET BY MOUTH EVERY DAY   tiZANidine (ZANAFLEX) 4 MG tablet TAKE 1 TABLET BY MOUTH EVERYDAY AT BEDTIME   traMADol (ULTRAM) 50 MG tablet TAKE 1 TABLET BY MOUTH 3 TIMES A DAY AS NEEDED FOR BACK OR LEG PAIN   Immunization History  Administered Date(s) Administered   Fluad Quad(high Dose 65+) 03/11/2019, 03/14/2020, 04/12/2021   Hepatitis A 08/13/2006   Influenza, High Dose Seasonal PF 03/23/2015, 03/21/2016, 03/21/2017, 04/09/2018   PFIZER(Purple Top)SARS-COV-2 Vaccination 08/07/2019, 09/01/2019, 04/28/2020   Pfizer Covid-19 Vaccine Bivalent Booster 60yr & up 04/18/2021   Pneumococcal Conjugate-13 05/25/2014   Pneumococcal Polysaccharide-23 04/08/2012   Tdap 10/26/2005   Zoster, Live 08/13/2006,  04/17/2013       Objective:   Physical Exam BP 110/62 (BP Location: Left Arm, Cuff Size: Large)   Pulse 89   Temp 97.8 F (36.6 C) (Temporal)   Ht 6' (1.829 m)   Wt 219 lb (99.3 kg)   SpO2 96%   BMI 29.70 kg/m  GENERAL: Awake gentleman, no acute distress, fully ambulatory, well-groomed, no conversational dyspnea.  Voice sounds wet, patient has a clear throat constantly. HEAD: Normocephalic, atraumatic.  EYES: Pupils equal, round, reactive to light.  No scleral icterus.  MOUTH: Oral mucosa moist.  No thrush. NECK: Supple. No thyromegaly. Trachea midline. No JVD.  No adenopathy. PULMONARY: Good air entry bilaterally.  No adventitious sounds. CARDIOVASCULAR: S1 and S2. Regular rate and rhythm.  No rubs, murmurs or gallops heard. ABDOMEN: Obese otherwise benign. MUSCULOSKELETAL: No joint deformity, no clubbing, no edema.  NEUROLOGIC: No overt major deficit noted. SKIN: Intact,warm,dry. PSYCH: Mood and behavior normal.     Assessment & Plan:     ICD-10-CM   1. Recurrent aspiration pneumonia ([place]  J69.0    Needs evaluation by GI and neurosurgery Not a primary pulmonary issue Lungs are innocent bystander in this situation Follow speech pathology recommendations     2. Dysphagia causing pulmonary aspiration with swallowing  R13.19    Recurrent pneumonias are due to this Patient needs to follow speech pathology recommendations to the letter      Unfortunately the lung in this situation is an innocent bystander.  There is no medication to prevent episodes of pneumonia.  Key to prevent the pneumonia is to prevent the aspiration.  He has pending neurosurgery and GI evaluations and I encouraged him to follow these.  We will see the patient in follow-up on an as-needed basis.  CRenold Don MD Advanced Bronchoscopy PCCM [facility]    *This note was dictated using voice recognition software/Dragon.  Despite best efforts to proofread, errors can occur which  can change the meaning. Any transcriptional errors that result from this process are unintentional and may not be fully corrected at the time of dictation.

## 2021-12-25 ENCOUNTER — Ambulatory Visit (INDEPENDENT_AMBULATORY_CARE_PROVIDER_SITE_OTHER): Payer: HMO | Admitting: Family Medicine

## 2021-12-25 ENCOUNTER — Encounter: Payer: Self-pay | Admitting: Family Medicine

## 2021-12-25 VITALS — BP 137/63 | HR 82 | Temp 98.9°F | Resp 16 | Wt 224.7 lb

## 2021-12-25 DIAGNOSIS — I119 Hypertensive heart disease without heart failure: Secondary | ICD-10-CM

## 2021-12-25 DIAGNOSIS — I10 Essential (primary) hypertension: Secondary | ICD-10-CM

## 2021-12-25 DIAGNOSIS — R1314 Dysphagia, pharyngoesophageal phase: Secondary | ICD-10-CM | POA: Diagnosis not present

## 2021-12-25 DIAGNOSIS — J690 Pneumonitis due to inhalation of food and vomit: Secondary | ICD-10-CM

## 2021-12-25 DIAGNOSIS — G4733 Obstructive sleep apnea (adult) (pediatric): Secondary | ICD-10-CM

## 2021-12-25 DIAGNOSIS — R42 Dizziness and giddiness: Secondary | ICD-10-CM

## 2021-12-25 DIAGNOSIS — E119 Type 2 diabetes mellitus without complications: Secondary | ICD-10-CM | POA: Diagnosis not present

## 2021-12-25 DIAGNOSIS — S12201S Unspecified nondisplaced fracture of third cervical vertebra, sequela: Secondary | ICD-10-CM

## 2021-12-25 DIAGNOSIS — M47817 Spondylosis without myelopathy or radiculopathy, lumbosacral region: Secondary | ICD-10-CM

## 2021-12-25 MED ORDER — MECLIZINE HCL 25 MG PO TABS: 25.0000 mg | ORAL_TABLET | Freq: Three times a day (TID) | ORAL | 1 refills | Status: DC | PRN

## 2021-12-31 ENCOUNTER — Other Ambulatory Visit: Payer: Self-pay | Admitting: Family Medicine

## 2021-12-31 DIAGNOSIS — J309 Allergic rhinitis, unspecified: Secondary | ICD-10-CM

## 2022-01-14 NOTE — Progress Notes (Signed)
01/15/22 11:28 AM   Manuel Stafford July 12, 1945 [id number]  Referring provider:  [name], MD [address] [place],  [place] [address]  No chief complaint on file.   Urological history  1. Nephrolithiasis -stone composition 65% calcium oxalate, 5% calcium phosphate, 30% uric acid -URS 2017  -constrast CT, 10/2021 - 3 mm left lower pole stone  2. ED -contributing factors of age, HTN, sleep apnea, diabetes, HLD and obesity -manage with tadalafil 20 mg, on-demand-dosing - IPSS 11/5 -  3. Ejaculatory disorder  - prescribed Periactin 4 mg, take 1 tablet 3 hours prior to intercourse  HPI: Manuel Stafford. is a 76 y.o.male who presets today for further evaluation of incontinence and ED.    He reports that his ED symptoms have slightly improved but his erections are still not as good as he wants them to be. He denies spontaneous erections.   On some nights, he reports that wakes up 4x nightly to urinate. He has urinary urgency and post-void dribbling.  He describes the post-void dribbling as very bothersome as he finds big wet stains on his pants.    Patient denies any modifying or aggravating factors.  Patient denies any gross hematuria, dysuria or suprapubic/flank pain. Patient denies any fevers, chills, nausea or vomiting.   UA 6-10 WBC's.    PVR 70 mL    IPSS     Row Name 01/15/22 1000         International Prostate Symptom Score   How often have you had the sensation of not emptying your bladder? Less than half the time     How often have you had to urinate less than every two hours? Less than half the time     How often have you found you stopped and started again several times when you urinated? Not at All     How often have you found it difficult to postpone urination? About half the time     How often have you had a weak urinary stream? Less than 1 in 5 times     How often have you had to strain to start urination? Not at All      How many times did you typically get up at night to urinate? 3 Times     Total IPSS Score 11       Quality of Life due to urinary symptoms   If you were to spend the rest of your life with your urinary condition just the way it is now how would you feel about that? Unhappy              Score:  1-7 Mild 8-19 Moderate 20-35 Severe   PMH: Past Medical History:  Diagnosis Date   Back pain    lower   Diabetes mellitus without complication ([place])    Patient takes Metformin.   Elevated lipids    GERD (gastroesophageal reflux disease)    Hemorrhoids 2017   Hyperlipidemia    Hypertension    Kidney stone 2017   Parkinsonian features     Surgical History: Past Surgical History:  Procedure Laterality Date   CIRCUMCISION     COLONOSCOPY  2007   COLONOSCOPY WITH PROPOFOL N/A 08/22/2016   Procedure: COLONOSCOPY WITH PROPOFOL;  Surgeon: Christene Lye, MD;  Location: [facility];  Service: Endoscopy;  Laterality: N/A;   CYSTOSCOPY WITH STENT PLACEMENT Right 09/18/2015   Procedure: CYSTOSCOPY WITH STENT PLACEMENT;  Surgeon: Hollice Espy,  MD;  Location: [facility];  Service: Urology;  Laterality: Right;   CYSTOSCOPY WITH URETEROSCOPY, STONE BASKETRY AND STENT PLACEMENT Right 10/03/2015   Procedure: CYSTOSCOPY WITH URETEROSCOPY, STONE BASKETRY AND STENT PLACEMENT;  Surgeon: Hollice Espy, MD;  Location: [facility];  Service: Urology;  Laterality: Right;   EXTRACORPOREAL SHOCK WAVE LITHOTRIPSY Right 09/15/2015   Procedure: EXTRACORPOREAL SHOCK WAVE LITHOTRIPSY (ESWL);  Surgeon: Nickie Retort, MD;  Location: [facility];  Service: Urology;  Laterality: Right;   HERNIA REPAIR     ROTATOR CUFF REPAIR Right 06/2012    Home Medications:  Allergies as of 01/15/2022   No Known Allergies      Medication List        Accurate as of January 15, 2022 11:28 AM. If you have any questions, ask your nurse or doctor.          aspirin 81 MG tablet Take 81 mg by mouth daily.    cetirizine 10 MG tablet Commonly known as: ZYRTEC TAKE 1 TABLET BY MOUTH EVERY DAY   FreeStyle Libre 2 Sensor Misc USE AS DIRECTED. CONTINUOUS BLOOD SENSOR.   glucose blood test strip 1 each by Other route daily. Use as instructed   hydrochlorothiazide 25 MG tablet Commonly known as: HYDRODIURIL Take 1 tablet (25 mg total) by mouth daily.   meclizine 25 MG tablet Commonly known as: ANTIVERT Take 1 tablet (25 mg total) by mouth 3 (three) times daily as needed for dizziness.   metFORMIN 1000 MG tablet Commonly known as: GLUCOPHAGE TAKE 1 TABLET (1,000 MG TOTAL) BY MOUTH 2 (TWO) TIMES DAILY WITH A MEAL.   MULTIPLE VITAMIN PO Take by mouth daily.   Ozempic (0.25 or 0.5 MG/DOSE) 2 MG/3ML Sopn Generic drug: Semaglutide(0.25 or 0.5MG/DOS) Please specify directions, refills and quantity   pantoprazole 40 MG tablet Commonly known as: PROTONIX Take 40 mg by mouth daily.   pioglitazone 15 MG tablet Commonly known as: ACTOS TAKE 1 TABLET BY MOUTH EVERY DAY   rOPINIRole 0.25 MG tablet Commonly known as: REQUIP Take by mouth daily.   rosuvastatin 20 MG tablet Commonly known as: CRESTOR TAKE 1 TABLET BY MOUTH EVERY DAY   Saw Palmetto Berry 160 MG Caps Take 2 tablets by mouth daily.   sildenafil 20 MG tablet Commonly known as: REVATIO Take 3 to 5 tablets two hours before intercouse on an empty stomach.  Do not take with nitrates. Started by: Zara Council, PA-C   tadalafil 5 MG tablet Commonly known as: CIALIS Take 1 tablet (5 mg total) by mouth daily.   telmisartan 40 MG tablet Commonly known as: MICARDIS TAKE 1 TABLET BY MOUTH EVERY DAY   tiZANidine 4 MG tablet Commonly known as: ZANAFLEX TAKE 1 TABLET BY MOUTH EVERYDAY AT BEDTIME   traMADol 50 MG tablet Commonly known as: ULTRAM TAKE 1 TABLET BY MOUTH 3 TIMES A DAY AS NEEDED FOR BACK OR LEG PAIN   valACYclovir 1000 MG tablet Commonly known as: VALTREX Take 1 tablet (1,000 mg total) by mouth 3 (three)  times daily.   Vitamin D3 50 MCG (2000 UT) capsule Take 2,000 Units by mouth daily.        Allergies:  No Known Allergies  Family History: Family History  Problem Relation Age of Onset   Hypertension Mother    Diabetes Father    CAD Father    Diabetes Sister    Heart disease Sister    CAD Sister     Social History:  reports that he quit  smoking about 47 years ago. His smoking use included cigarettes. He has a 30.00 pack-year smoking history. He has never used smokeless tobacco. He reports that he does not drink alcohol and does not use drugs.   Physical Exam: BP 123/68   Pulse 93   Ht 6' 1"  (1.854 m)   Wt 217 lb (98.4 kg)   BMI 28.63 kg/m   Constitutional:  Alert and oriented, No acute distress. HEENT: [place] AT, moist mucus membranes.  Trachea midline Cardiovascular: No clubbing, cyanosis, or edema. Respiratory: Normal respiratory effort, no increased work of breathing. Neurologic: Grossly intact, no focal deficits, moving all 4 extremities. Psychiatric: Normal mood and affect.  Laboratory Data: Lab Results  Component Value Date   CREATININE 1.62 (H) 11/07/2021   Lab Results  Component Value Date   HGBA1C 6.0 (H) 11/07/2021   Component     Latest Ref Rng 11/07/2021  Glucose     70 - 99 mg/dL 108 (H)   BUN     8 - 27 mg/dL 21   Creatinine     0.76 - 1.27 mg/dL 1.62 (H)   BUN/Creatinine Ratio     10 - 24  13   Sodium     134 - 144 mmol/L 141   Potassium     3.5 - 5.2 mmol/L 4.7   Chloride     96 - 106 mmol/L 102   CO2     20 - 29 mmol/L 23   Calcium     8.6 - 10.2 mg/dL 9.7   Total Protein     6.0 - 8.5 g/dL 7.1   Albumin     3.7 - 4.7 g/dL 4.3   Globulin, Total     1.5 - 4.5 g/dL 2.8   Albumin/Globulin Ratio     1.2 - 2.2  1.5   Total Bilirubin     0.0 - 1.2 mg/dL 0.4   Alkaline Phosphatase     44 - 121 IU/L 128 (H)   AST     0 - 40 IU/L 18   ALT     0 - 44 IU/L 21   eGFR     >59 mL/min/1.73 43 (L)     Legend: (H) High (L)  Low  Component     Latest Ref Rng 11/07/2021  WBC     3.4 - 10.8 x10E3/uL 8.8   RBC     4.14 - 5.80 x10E6/uL 4.58   Hemoglobin     13.0 - 17.7 g/dL 14.5   HCT     37.5 - 51.0 % 43.4   MCV     79 - 97 fL 95   MCH     26.6 - 33.0 pg 31.7   MCHC     31.5 - 35.7 g/dL 33.4   RDW     11.6 - 15.4 % 12.3   Platelets     150 - 450 x10E3/uL 235   Neutrophils     Not Estab. % 58   Lymphs     Not Estab. % 29   Monocytes     Not Estab. % 9   Eos     Not Estab. % 2   Basos     Not Estab. % 1   NEUT#     1.4 - 7.0 x10E3/uL 5.2   Lymphocyte #     0.7 - 3.1 x10E3/uL 2.5   Monocytes Absolute     0.1 - 0.9 x10E3/uL 0.8   EOS (  ABSOLUTE)     0.0 - 0.4 x10E3/uL 0.2   Basophils Absolute     0.0 - 0.2 x10E3/uL 0.0   Immature Granulocytes     Not Estab. % 1   Immature Grans (Abs)     0.0 - 0.1 x10E3/uL 0.0   nRBC     0.0 - 0.2 %   Lymphocytes     %   Monocytes Relative     %   Monocyte #     0.1 - 1.0 K/uL   Eosinophil     %   Eosinophils Absolute     0.0 - 0.5 K/uL   Basophil     %   Abs Immature Granulocytes     0.00 - 0.07 K/uL     Pertinent Imaging/urinalysis: Results for orders placed or performed in visit on 01/15/22  BLADDER SCAN AMB NON-IMAGING  Result Value Ref Range   Scan Result 70 ml   Results for orders placed or performed during the hospital encounter of 01/15/22  Urinalysis, Complete w Microscopic  Result Value Ref Range   Color, Urine YELLOW YELLOW   APPearance CLEAR CLEAR   Specific Gravity, Urine 1.020 1.005 - 1.030   pH 5.0 5.0 - 8.0   Glucose, UA NEGATIVE NEGATIVE mg/dL   Hgb urine dipstick NEGATIVE NEGATIVE   Bilirubin Urine NEGATIVE NEGATIVE   Ketones, ur NEGATIVE NEGATIVE mg/dL   Protein, ur NEGATIVE NEGATIVE mg/dL   Nitrite NEGATIVE NEGATIVE   Leukocytes,Ua NEGATIVE NEGATIVE   Squamous Epithelial / LPF 0-5 0 - 5   WBC, UA 6-10 0 - 5 WBC/hpf   RBC / HPF 0-5 0 - 5 RBC/hpf   Bacteria, UA NONE SEEN NONE SEEN  I have reviewed the labs.     Assessment & Plan:   ED  - discussed taking tadalafil 5 mg daily and boosting with sildenafil 20 mg, on-demand-dosing, he will start taking 2 tablets of the sildenafil and increase slowly to 5 tablets if necessary  2. Nocturia  - He will discuss with his PCP for repeat sleep study to see if his sleep apnea is contributing to this.   3. BOO/ post void dribbling  -he is currently taking both tamsulosin 0.4 mg and tadalafil 5 mg daily and still experiencing what sound like significant post-void dribbling -explained that there is a concern for a median lobe of his prostate causing this and that medication typically is not effective in treating this issue and that a bladder outlet procedure would be more effective -he is in agreement and would like to pursue a cysto at this time to evaluate for a median lobe/BOO - Will have him follow-up with Dr Erlene Quan for diagnostic cystoscopy to evaluate the anatomy of the bladder and prostate   Return for cysto with Dr. Erlene Quan for BOO/post void dribbling in [place] .   [facility] [address] [place], [place] [address] [phone]  I,Kailey Littlejohn,acting as a scribe for [facility], PA-C.,have documented all relevant documentation on the behalf of Zilla Shartzer, PA-C,as directed by  [facility], PA-C while in the presence of Faith, PA-C.  I have reviewed the above documentation for accuracy and completeness, and I agree with the above.    Zara Council, PA-C

## 2022-01-15 ENCOUNTER — Ambulatory Visit (INDEPENDENT_AMBULATORY_CARE_PROVIDER_SITE_OTHER): Payer: HMO | Admitting: Urology

## 2022-01-15 ENCOUNTER — Other Ambulatory Visit
Admission: RE | Admit: 2022-01-15 | Discharge: 2022-01-15 | Disposition: A | Discharge status: Home / Self Care | Payer: HMO | Attending: Urology | Admitting: Urology

## 2022-01-15 ENCOUNTER — Other Ambulatory Visit: Payer: Self-pay

## 2022-01-15 ENCOUNTER — Encounter: Payer: Self-pay | Admitting: Urology

## 2022-01-15 VITALS — BP 123/68 | HR 93 | Ht 73.0 in | Wt 217.0 lb

## 2022-01-15 DIAGNOSIS — F5232 Male orgasmic disorder: Secondary | ICD-10-CM | POA: Diagnosis not present

## 2022-01-15 DIAGNOSIS — R32 Unspecified urinary incontinence: Secondary | ICD-10-CM | POA: Diagnosis not present

## 2022-01-15 DIAGNOSIS — N200 Calculus of kidney: Secondary | ICD-10-CM

## 2022-01-15 DIAGNOSIS — N528 Other male erectile dysfunction: Secondary | ICD-10-CM | POA: Diagnosis not present

## 2022-01-15 LAB — URINALYSIS, COMPLETE (UACMP) WITH MICROSCOPIC
Bacteria, UA: NONE SEEN
Bilirubin Urine: NEGATIVE
Glucose, UA: NEGATIVE mg/dL
Hgb urine dipstick: NEGATIVE
Ketones, ur: NEGATIVE mg/dL
Leukocytes,Ua: NEGATIVE
Nitrite: NEGATIVE
Protein, ur: NEGATIVE mg/dL
Specific Gravity, Urine: 1.02 (ref 1.005–1.030)
pH: 5 (ref 5.0–8.0)

## 2022-01-15 LAB — BLADDER SCAN AMB NON-IMAGING: Scan Result: 70

## 2022-01-15 MED ORDER — TADALAFIL 5 MG PO TABS: 5.0000 mg | ORAL_TABLET | Freq: Every day | ORAL | 3 refills | Status: DC

## 2022-01-15 MED ORDER — SILDENAFIL CITRATE 20 MG PO TABS: ORAL_TABLET | ORAL | 3 refills | Status: DC

## 2022-01-16 LAB — URINE CULTURE: Culture: NO GROWTH

## 2022-01-19 ENCOUNTER — Ambulatory Visit (INDEPENDENT_AMBULATORY_CARE_PROVIDER_SITE_OTHER): Payer: HMO | Admitting: Neurosurgery

## 2022-01-19 ENCOUNTER — Encounter: Payer: Self-pay | Admitting: Neurosurgery

## 2022-01-19 VITALS — BP 128/76 | Ht 73.0 in | Wt 217.0 lb

## 2022-01-19 DIAGNOSIS — M2578 Osteophyte, vertebrae: Secondary | ICD-10-CM | POA: Diagnosis not present

## 2022-01-19 DIAGNOSIS — R1319 Other dysphagia: Secondary | ICD-10-CM

## 2022-01-19 NOTE — Progress Notes (Signed)
Referring Physician:  [name], MD [address] [place],  [place] [address]  Primary Physician:  [name], MD  History of Present Illness: 01/19/2022 Manuel Stafford is a 76 y.o with a history of OSA, DM, GERD, HLD, HTN,  who is here today with a chief complaint of dysphagia.  Is been going on for several years however has worsened over the last 6 months and resulted in an episode of aspiration pneumonitis.  He has had a swallow study today and was recommended that he use thickened liquids.  He is here today for evaluation of anterior bridging osteophytes as a potential cause of his dysphagia.  He states that he seems to have trouble with larger food boluses and dry foods such as potato chips but is able to tolerate his own saliva.  He is also able to swallow his medication with ease.  He does have episodes of feeling as though food gets caught in his throat. While he denies any significant neck pain or radiating arm pain, he does have some limited range of motion of his neck particularly with extension.  The symptoms are causing a significant impact on the patient's life.   Review of Systems:  A 10 point review of systems is negative, except for the pertinent positives and negatives detailed in the HPI.  Past Medical History: Past Medical History:  Diagnosis Date   Back pain    lower   Diabetes mellitus without complication ([place])    Patient takes Metformin.   Elevated lipids    GERD (gastroesophageal reflux disease)    Hemorrhoids 2017   Hyperlipidemia    Hypertension    Kidney stone 2017   Parkinsonian features     Past Surgical History: Past Surgical History:  Procedure Laterality Date   CIRCUMCISION     COLONOSCOPY  2007   COLONOSCOPY WITH PROPOFOL N/A 08/22/2016   Procedure: COLONOSCOPY WITH PROPOFOL;  Surgeon: Christene Lye, MD;  Location: [facility];  Service: Endoscopy;  Laterality: N/A;   CYSTOSCOPY WITH STENT  PLACEMENT Right 09/18/2015   Procedure: CYSTOSCOPY WITH STENT PLACEMENT;  Surgeon: Hollice Espy, MD;  Location: [facility];  Service: Urology;  Laterality: Right;   CYSTOSCOPY WITH URETEROSCOPY, STONE BASKETRY AND STENT PLACEMENT Right 10/03/2015   Procedure: CYSTOSCOPY WITH URETEROSCOPY, STONE BASKETRY AND STENT PLACEMENT;  Surgeon: Hollice Espy, MD;  Location: [facility];  Service: Urology;  Laterality: Right;   EXTRACORPOREAL SHOCK WAVE LITHOTRIPSY Right 09/15/2015   Procedure: EXTRACORPOREAL SHOCK WAVE LITHOTRIPSY (ESWL);  Surgeon: Nickie Retort, MD;  Location: [facility];  Service: Urology;  Laterality: Right;   HERNIA REPAIR     ROTATOR CUFF REPAIR Right 06/2012    Allergies: Allergies as of 01/19/2022   (No Known Allergies)    Medications: Outpatient Encounter Medications as of 01/19/2022  Medication Sig   aspirin 81 MG tablet Take 81 mg by mouth daily.    cetirizine (ZYRTEC) 10 MG tablet TAKE 1 TABLET BY MOUTH EVERY DAY   Cholecalciferol (VITAMIN D3) 2000 UNITS capsule Take 2,000 Units by mouth daily.    Continuous Blood Gluc Sensor (FREESTYLE LIBRE 2 SENSOR) MISC USE AS DIRECTED. CONTINUOUS BLOOD SENSOR.   glucose blood test strip 1 each by Other route daily. Use as instructed   hydrochlorothiazide (HYDRODIURIL) 25 MG tablet Take 1 tablet (25 mg total) by mouth daily.   meclizine (ANTIVERT) 25 MG tablet Take 1 tablet (25 mg total) by mouth 3 (three) times daily  as needed for dizziness.   metFORMIN (GLUCOPHAGE) 1000 MG tablet TAKE 1 TABLET (1,000 MG TOTAL) BY MOUTH 2 (TWO) TIMES DAILY WITH A MEAL.   MULTIPLE VITAMIN PO Take by mouth daily.    OZEMPIC, 0.25 OR 0.5 MG/DOSE, 2 MG/3ML SOPN Please specify directions, refills and quantity   pantoprazole (PROTONIX) 40 MG tablet Take 40 mg by mouth daily.   pioglitazone (ACTOS) 15 MG tablet TAKE 1 TABLET BY MOUTH EVERY DAY   rOPINIRole (REQUIP) 0.25 MG tablet Take by mouth daily.    rosuvastatin (CRESTOR) 20 MG tablet TAKE 1 TABLET BY  MOUTH EVERY DAY   Saw Palmetto, Serenoa repens, (SAW PALMETTO BERRY) 160 MG CAPS Take 2 tablets by mouth daily.    sildenafil (REVATIO) 20 MG tablet Take 3 to 5 tablets two hours before intercouse on an empty stomach.  Do not take with nitrates.   tadalafil (CIALIS) 5 MG tablet Take 1 tablet (5 mg total) by mouth daily.   telmisartan (MICARDIS) 40 MG tablet TAKE 1 TABLET BY MOUTH EVERY DAY   tiZANidine (ZANAFLEX) 4 MG tablet TAKE 1 TABLET BY MOUTH EVERYDAY AT BEDTIME   traMADol (ULTRAM) 50 MG tablet TAKE 1 TABLET BY MOUTH 3 TIMES A DAY AS NEEDED FOR BACK OR LEG PAIN   valACYclovir (VALTREX) 1000 MG tablet Take 1 tablet (1,000 mg total) by mouth 3 (three) times daily.   No facility-administered encounter medications on file as of 01/19/2022.    Social History: Social History   Tobacco Use   Smoking status: Former    Packs/day: 2.00    Years: 15.00    Total pack years: 30.00    Types: Cigarettes    Quit date: 06/25/1974    Years since quitting: 47.6   Smokeless tobacco: Never  Vaping Use   Vaping Use: Never used  Substance Use Topics   Alcohol use: No   Drug use: No    Family Medical History: Family History  Problem Relation Age of Onset   Hypertension Mother    Diabetes Father    CAD Father    Diabetes Sister    Heart disease Sister    CAD Sister     Physical Examination: '@VITALWITHPAIN'$ @  General: Patient is well developed, well nourished, calm, collected, and in no apparent distress. Attention to examination is appropriate.  Psychiatric: Patient is non-anxious.  Head:  Pupils equal, round, and reactive to light.  ENT:  Oral mucosa appears well hydrated.  Neck:   Supple.   Respiratory: Patient is breathing without any difficulty.  Extremities: No edema.  Vascular: Palpable dorsal pedal pulses.  Skin:   On exposed skin, there are no abnormal skin lesions.  NEUROLOGICAL:     Awake, alert, oriented to person, place, and time.  Speech is clear and fluent. Fund  of knowledge is appropriate.   Cranial Nerves: Pupils equal round and reactive to light.  Facial tone is symmetric.  Facial sensation is symmetric. Shoulder shrug is symmetric.   ROM of spine: limited cervical extension Palpation of spine: Non TTP  Strength: Side Biceps Triceps Deltoid Interossei Grip Wrist Ext. Wrist Flex.  R '5 5 5 5 5 5 5  '$ L '5 5 5 5 5 5 5   '$ Side Iliopsoas Quads Hamstring PF DF EHL  R '5 5 5 5 5 5  '$ L '5 5 5 5 5 5   '$ Reflexes are 2+ and symmetric at the biceps, triceps, brachioradialis, patella and achilles.   Hoffman's is absent.  Clonus  is not present.  Toes are down-going.  Bilateral upper and lower extremity sensation is intact to light touch.    Gait is normal.    Medical Decision Making  Imaging: 11/15/21 swallow study FINDINGS: Real-time fluoroscopy of the swallowing function was performed with a speech pathologist present.   Multiple consistencies of barium were administered which included water, nectar, and Graham cracker.   Laryngeal penetration was seen which did initiate a cough response, one episode of tracheal aspiration which did initiate a cough response. Bulky anterior bridging osteophyte at C3-4 and C4-5.   IMPRESSION: 1. Modified barium swallow as described above.   Please refer to the Speech Pathologists report for complete details and recommendations.   This exam was performed by Tsosie Billing PA-C, and was supervised and interpreted by Dr. Posey Pronto.     Electronically Signed   By: Kathreen Devoid M.D.   On: 11/15/2021 15:29  Assessment and Plan: Manuel Stafford is a pleasant 76 y.o. male with progressive dysphagia and an abnormal swallow study.  He does have bridging osteophytes on his swallow study.  We will move forward with a CT of his neck for complete evaluation.  Should this show significant esophageal narrowing as a result of these bridging osteophytes, we will move forward with getting him scheduled to see Dr. Cari Caraway for  discussion of possible surgical intervention.  I will contact the patient with the results of his CT scan.  He was encouraged to call the office in the interim with any questions or concerns.  He expressed understanding was in agreement with this plan.   Thank you for involving me in the care of this patient.   I spent a total of 35 minutes in both face-to-face and non-face-to-face activities for this visit on the date of this encounter.   [facility]

## 2022-01-30 ENCOUNTER — Ambulatory Visit: Payer: HMO | Admitting: Urology

## 2022-01-30 VITALS — BP 132/64 | HR 90 | Ht 72.0 in | Wt 220.0 lb

## 2022-01-30 DIAGNOSIS — R1314 Dysphagia, pharyngoesophageal phase: Secondary | ICD-10-CM | POA: Diagnosis not present

## 2022-01-30 DIAGNOSIS — N138 Other obstructive and reflux uropathy: Secondary | ICD-10-CM | POA: Diagnosis not present

## 2022-01-30 DIAGNOSIS — R32 Unspecified urinary incontinence: Secondary | ICD-10-CM

## 2022-01-30 DIAGNOSIS — M2578 Osteophyte, vertebrae: Secondary | ICD-10-CM | POA: Diagnosis not present

## 2022-01-30 DIAGNOSIS — K219 Gastro-esophageal reflux disease without esophagitis: Secondary | ICD-10-CM | POA: Diagnosis not present

## 2022-01-30 DIAGNOSIS — Z8701 Personal history of pneumonia (recurrent): Secondary | ICD-10-CM | POA: Diagnosis not present

## 2022-01-30 DIAGNOSIS — N3943 Post-void dribbling: Secondary | ICD-10-CM

## 2022-01-30 LAB — URINALYSIS, COMPLETE
Bilirubin, UA: NEGATIVE
Glucose, UA: NEGATIVE
Ketones, UA: NEGATIVE
Leukocytes,UA: NEGATIVE
Nitrite, UA: NEGATIVE
Protein,UA: NEGATIVE
RBC, UA: NEGATIVE
Specific Gravity, UA: 1.01 (ref 1.005–1.030)
Urobilinogen, Ur: 0.2 mg/dL (ref 0.2–1.0)
pH, UA: 5 (ref 5.0–7.5)

## 2022-01-30 LAB — MICROSCOPIC EXAMINATION: Bacteria, UA: NONE SEEN

## 2022-01-30 MED ORDER — GEMTESA 75 MG PO TABS: 75.0000 mg | ORAL_TABLET | Freq: Every day | ORAL | 0 refills | Status: DC

## 2022-01-30 NOTE — Progress Notes (Signed)
   01/30/22 CC:  Chief Complaint  Patient presents with   Cysto    HPI: Manuel Stafford. is a 76 y.o. male with a personal history of nephrolithiasis, ED on tadalafil 20 mg, ejaculatory disorder on periactin 4 mg, and BOO/ post void dribbling on tamsulosin and tadalafil, who presents today for diagnostic cystoscopy.   S/p ureteroscopy in 2017. Recent imaging in 10/2021 visualized a 3 mm left lower pole stone.   I personally reviewed his CT scan from and measured prostate volume of 49 gm.   He reports urgency incontinence , urgency and frequency.   Vitals:   01/30/22 1357  BP: 132/64  Pulse: 90   NED. A&Ox3.   No respiratory distress   Abd soft, NT, ND Normal phallus with bilateral descended testicles  Cystoscopy Procedure Note  Patient identification was confirmed, informed consent was obtained, and patient was prepped using Betadine solution.  Lidocaine jelly was administered per urethral meatus.     Pre-Procedure: - Inspection reveals a normal caliber ureteral meatus.  Procedure: The flexible cystoscope was introduced without difficulty - No urethral strictures/lesions are present. - Enlarged prostate  - Mildly Elevated bladder neck - Bilateral ureteral orifices identified - Bladder mucosa  reveals no ulcers, tumors, or lesions - No bladder stones - No trabeculation  Retroflexion shows unremarkable    Post-Procedure: - Patient tolerated the procedure well   Assessment/ Plan: BOO/ post void dribbling  - Symptoms are more irritative in nature. He may benefit from an outlet procedure in the future but will treat his irritative symptoms with samples of Gemtesa.   No follow-ups on file.  I,Kailey Littlejohn,acting as a Education administrator for Hollice Espy, MD.,have documented all relevant documentation on the behalf of Hollice Espy, MD,as directed by  Hollice Espy, MD while in the presence of Hollice Espy, MD.

## 2022-01-31 ENCOUNTER — Ambulatory Visit
Admission: RE | Admit: 2022-01-31 | Discharge: 2022-01-31 | Disposition: A | Discharge status: Home / Self Care | Payer: HMO | Attending: Family Medicine | Admitting: Family Medicine

## 2022-01-31 ENCOUNTER — Ambulatory Visit
Admission: RE | Admit: 2022-01-31 | Discharge: 2022-01-31 | Disposition: A | Discharge status: Home / Self Care | Payer: HMO | Source: Ambulatory Visit | Attending: Family Medicine | Admitting: Family Medicine

## 2022-01-31 ENCOUNTER — Ambulatory Visit (INDEPENDENT_AMBULATORY_CARE_PROVIDER_SITE_OTHER): Payer: HMO | Admitting: Family Medicine

## 2022-01-31 ENCOUNTER — Encounter: Payer: Self-pay | Admitting: Family Medicine

## 2022-01-31 VITALS — BP 110/66 | HR 77 | Temp 97.9°F | Resp 16 | Wt 221.0 lb

## 2022-01-31 DIAGNOSIS — G4733 Obstructive sleep apnea (adult) (pediatric): Secondary | ICD-10-CM

## 2022-01-31 DIAGNOSIS — S12201S Unspecified nondisplaced fracture of third cervical vertebra, sequela: Secondary | ICD-10-CM | POA: Diagnosis not present

## 2022-01-31 DIAGNOSIS — J189 Pneumonia, unspecified organism: Secondary | ICD-10-CM | POA: Insufficient documentation

## 2022-01-31 DIAGNOSIS — R0602 Shortness of breath: Secondary | ICD-10-CM | POA: Diagnosis not present

## 2022-01-31 DIAGNOSIS — E6609 Other obesity due to excess calories: Secondary | ICD-10-CM

## 2022-01-31 DIAGNOSIS — R1314 Dysphagia, pharyngoesophageal phase: Secondary | ICD-10-CM

## 2022-01-31 DIAGNOSIS — I10 Essential (primary) hypertension: Secondary | ICD-10-CM

## 2022-01-31 DIAGNOSIS — Z6830 Body mass index (BMI) 30.0-30.9, adult: Secondary | ICD-10-CM | POA: Diagnosis not present

## 2022-01-31 DIAGNOSIS — R509 Fever, unspecified: Secondary | ICD-10-CM | POA: Diagnosis not present

## 2022-01-31 DIAGNOSIS — J690 Pneumonitis due to inhalation of food and vomit: Secondary | ICD-10-CM

## 2022-01-31 MED ORDER — DOXYCYCLINE HYCLATE 100 MG PO TABS: 100.0000 mg | ORAL_TABLET | Freq: Two times a day (BID) | ORAL | 0 refills | Status: DC

## 2022-01-31 NOTE — Progress Notes (Signed)
Established patient visit  I,[name],acting as a scribe for [name], MD.,have documented all relevant documentation on the behalf of [name], MD,as directed by  [name], MD while in the presence of [name], MD.   Patient: Manuel Stafford.   DOB: 05/22/1946   76 y.o. Male  MRN: [id number] Visit Date: 01/31/2022  Today's healthcare provider: [name], MD   Chief Complaint  Patient presents with   Fatigue   Fever   Subjective    HPI  Patient began feeling exteremly fatigued about one week ago. Patient has been having a low-grade fever on and off. Patient states he not feel well at all. Patient is also having shortness of breath.  At rest he is fine, he gets a little short of breath if he exerts himself.  He is feeling better today.  He has been off his CPAP for a while now.  Medications: Outpatient Medications Prior to Visit  Medication Sig   aspirin 81 MG tablet Take 81 mg by mouth daily.    cetirizine (ZYRTEC) 10 MG tablet TAKE 1 TABLET BY MOUTH EVERY DAY   Cholecalciferol (VITAMIN D3) 2000 UNITS capsule Take 2,000 Units by mouth daily.    Continuous Blood Gluc Sensor (FREESTYLE LIBRE 2 SENSOR) MISC USE AS DIRECTED. CONTINUOUS BLOOD SENSOR.   glucose blood test strip 1 each by Other route daily. Use as instructed   hydrochlorothiazide (HYDRODIURIL) 25 MG tablet Take 1 tablet (25 mg total) by mouth daily.   meclizine (ANTIVERT) 25 MG tablet Take 1 tablet (25 mg total) by mouth 3 (three) times daily as needed for dizziness.   metFORMIN (GLUCOPHAGE) 1000 MG tablet TAKE 1 TABLET (1,000 MG TOTAL) BY MOUTH 2 (TWO) TIMES DAILY WITH A MEAL.   MULTIPLE VITAMIN PO Take by mouth daily.    OZEMPIC, 0.25 OR 0.5 MG/DOSE, 2 MG/3ML SOPN Please specify directions, refills and quantity   pantoprazole (PROTONIX) 40 MG tablet Take 40 mg by mouth daily.   pioglitazone (ACTOS) 15 MG tablet TAKE 1 TABLET BY MOUTH EVERY DAY   rOPINIRole  (REQUIP) 0.25 MG tablet Take by mouth daily.    rosuvastatin (CRESTOR) 20 MG tablet TAKE 1 TABLET BY MOUTH EVERY DAY   Saw Palmetto, Serenoa repens, (SAW PALMETTO BERRY) 160 MG CAPS Take 2 tablets by mouth daily.    sildenafil (REVATIO) 20 MG tablet Take 3 to 5 tablets two hours before intercouse on an empty stomach.  Do not take with nitrates.   tadalafil (CIALIS) 5 MG tablet Take 1 tablet (5 mg total) by mouth daily.   tamsulosin (FLOMAX) 0.4 MG CAPS capsule Take 0.4 mg by mouth daily.   telmisartan (MICARDIS) 40 MG tablet TAKE 1 TABLET BY MOUTH EVERY DAY   tiZANidine (ZANAFLEX) 4 MG tablet TAKE 1 TABLET BY MOUTH EVERYDAY AT BEDTIME   traMADol (ULTRAM) 50 MG tablet TAKE 1 TABLET BY MOUTH 3 TIMES A DAY AS NEEDED FOR BACK OR LEG PAIN   valACYclovir (VALTREX) 1000 MG tablet Take 1 tablet (1,000 mg total) by mouth 3 (three) times daily.   Vibegron (GEMTESA) 75 MG TABS Take 75 mg by mouth daily.   No facility-administered medications prior to visit.    Review of Systems  Constitutional:  Positive for fatigue and fever. Negative for appetite change and chills.  Respiratory:  Positive for shortness of breath. Negative for chest tightness and wheezing.   Cardiovascular:  Negative for chest pain  and palpitations.  Gastrointestinal:  Negative for abdominal pain, nausea and vomiting.    Last hemoglobin A1c Lab Results  Component Value Date   HGBA1C 6.0 (H) 11/07/2021       Objective    BP 110/66 (BP Location: Left Arm, Patient Position: Sitting, Cuff Size: Large)   Pulse 77   Temp 97.9 F (36.6 C) (Oral)   Resp 16   Wt 221 lb (100.2 kg)   SpO2 100%   BMI 29.97 kg/m  BP Readings from Last 3 Encounters:  01/31/22 110/66  01/30/22 132/64  01/19/22 128/76   Wt Readings from Last 3 Encounters:  01/31/22 221 lb (100.2 kg)  01/30/22 220 lb (99.8 kg)  01/19/22 217 lb (98.4 kg)      Physical Exam Vitals reviewed.  Constitutional:      General: He is not in acute distress.     Appearance: He is well-developed.  HENT:     Head: Normocephalic and atraumatic.     Right Ear: Hearing normal.     Left Ear: Hearing normal.     Nose: Nose normal.  Eyes:     General: Lids are normal. No scleral icterus.       Right eye: No discharge.        Left eye: No discharge.     Conjunctiva/sclera: Conjunctivae normal.  Cardiovascular:     Rate and Rhythm: Normal rate and regular rhythm.     Heart sounds: Normal heart sounds.  Pulmonary:     Effort: Pulmonary effort is normal. No respiratory distress.  Skin:    Findings: No lesion or rash.  Neurological:     General: No focal deficit present.     Mental Status: He is alert and oriented to person, place, and time.  Psychiatric:        Mood and Affect: Mood normal.        Speech: Speech normal.        Behavior: Behavior normal.        Thought Content: Thought content normal.        Judgment: Judgment normal.       No results found for any visits on 01/31/22.  Assessment & Plan     1. Walking pneumonia I believe this is a recurrent aspiration pneumonitis from his swallowing problem. At this time obtain repeat chest x-ray and treat with doxycycline for 5 days. - DG Chest 2 View - doxycycline (VIBRA-TABS) 100 MG tablet; Take 1 tablet (100 mg total) by mouth 2 (two) times daily.  Dispense: 10 tablet; Refill: 0  2. Aspiration pneumonitis (HCC) Trying to address both neurosurgical issue in GI issue.  3. Pharyngoesophageal dysphagia   4. Closed nondisplaced fracture of third cervical vertebra, unspecified fracture morphology, sequela   5. Benign essential HTN Good control.  6. Class 1 obesity due to excess calories with serious comorbidity and body mass index (BMI) of 30.0 to 30.9 in adult Doing well with diet and exercise.  7. Obstructive sleep apnea syndrome Encourage patient to get back on his CPAP.  This may lower the risk of recurrence   No follow-ups on file.      I, [name], MD, have  reviewed all documentation for this visit. The documentation on 01/31/22 for the exam, diagnosis, procedures, and orders are all accurate and complete.    Aleta Manternach Cranford Mon, MD  [facility] [phone] (phone) [phone] (fax)  [place]

## 2022-02-02 ENCOUNTER — Encounter: Payer: Self-pay | Admitting: Family Medicine

## 2022-02-02 NOTE — Telephone Encounter (Signed)
Lets try prednisone '20mg'$  daily for 5 days.

## 2022-02-05 ENCOUNTER — Other Ambulatory Visit: Payer: Self-pay

## 2022-02-05 MED ORDER — PREDNISONE 20 MG PO TABS: 20.0000 mg | ORAL_TABLET | Freq: Every day | ORAL | 0 refills | Status: DC

## 2022-02-14 ENCOUNTER — Ambulatory Visit
Admission: RE | Admit: 2022-02-14 | Discharge: 2022-02-14 | Disposition: A | Discharge status: Home / Self Care | Payer: HMO | Source: Ambulatory Visit | Attending: Neurosurgery | Admitting: Neurosurgery

## 2022-02-14 DIAGNOSIS — M542 Cervicalgia: Secondary | ICD-10-CM | POA: Diagnosis not present

## 2022-02-14 DIAGNOSIS — M2578 Osteophyte, vertebrae: Secondary | ICD-10-CM | POA: Insufficient documentation

## 2022-02-14 DIAGNOSIS — R1319 Other dysphagia: Secondary | ICD-10-CM | POA: Diagnosis not present

## 2022-02-21 ENCOUNTER — Ambulatory Visit: Payer: HMO | Admitting: Urology

## 2022-02-21 ENCOUNTER — Encounter: Payer: Self-pay | Admitting: Urology

## 2022-02-21 VITALS — BP 138/71 | HR 71 | Ht 72.0 in | Wt 221.0 lb

## 2022-02-21 DIAGNOSIS — R251 Tremor, unspecified: Secondary | ICD-10-CM | POA: Diagnosis not present

## 2022-02-21 DIAGNOSIS — R350 Frequency of micturition: Secondary | ICD-10-CM | POA: Diagnosis not present

## 2022-02-21 DIAGNOSIS — N401 Enlarged prostate with lower urinary tract symptoms: Secondary | ICD-10-CM | POA: Diagnosis not present

## 2022-02-21 DIAGNOSIS — R32 Unspecified urinary incontinence: Secondary | ICD-10-CM

## 2022-02-21 DIAGNOSIS — F5101 Primary insomnia: Secondary | ICD-10-CM | POA: Diagnosis not present

## 2022-02-21 LAB — BLADDER SCAN AMB NON-IMAGING

## 2022-02-21 MED ORDER — GEMTESA 75 MG PO TABS: 75.0000 mg | ORAL_TABLET | Freq: Every day | ORAL | 3 refills | Status: DC

## 2022-02-21 NOTE — Progress Notes (Unsigned)
Referring Physician:  Loleta Dicker, [address] [place],  [place] [address]  Primary Physician:  [name], MD  History of Present Illness: 02/22/2022 Manuel Stafford is here today with a chief complaint of dysphagia due to a cervical osteophyte, this has worsened over the past 6 months.  Past Surgery: denies  Manuel Stafford. has no symptoms of cervical myelopathy.  The symptoms are causing a significant impact on the patient's life.   Review of Systems:  A 10 point review of systems is negative, except for the pertinent positives and negatives detailed in the HPI.  Past Medical History: Past Medical History:  Diagnosis Date   Back pain    lower   Diabetes mellitus without complication ([place])    Patient takes Metformin.   Elevated lipids    GERD (gastroesophageal reflux disease)    Hemorrhoids 2017   Hyperlipidemia    Hypertension    Kidney stone 2017   Parkinsonian features     Past Surgical History: Past Surgical History:  Procedure Laterality Date   CIRCUMCISION     COLONOSCOPY  2007   COLONOSCOPY WITH PROPOFOL N/A 08/22/2016   Procedure: COLONOSCOPY WITH PROPOFOL;  Surgeon: Christene Lye, MD;  Location: [facility];  Service: Endoscopy;  Laterality: N/A;   CYSTOSCOPY WITH STENT PLACEMENT Right 09/18/2015   Procedure: CYSTOSCOPY WITH STENT PLACEMENT;  Surgeon: Hollice Espy, MD;  Location: [facility];  Service: Urology;  Laterality: Right;   CYSTOSCOPY WITH URETEROSCOPY, STONE BASKETRY AND STENT PLACEMENT Right 10/03/2015   Procedure: CYSTOSCOPY WITH URETEROSCOPY, STONE BASKETRY AND STENT PLACEMENT;  Surgeon: Hollice Espy, MD;  Location: [facility];  Service: Urology;  Laterality: Right;   EXTRACORPOREAL SHOCK WAVE LITHOTRIPSY Right 09/15/2015   Procedure: EXTRACORPOREAL SHOCK WAVE LITHOTRIPSY (ESWL);  Surgeon: Nickie Retort, MD;  Location: [facility];  Service: Urology;  Laterality: Right;   HERNIA REPAIR      ROTATOR CUFF REPAIR Right 06/2012    Allergies: Allergies as of 02/22/2022   (No Known Allergies)    Medications: Current Meds  Medication Sig   aspirin 81 MG tablet Take 81 mg by mouth daily.    cetirizine (ZYRTEC) 10 MG tablet TAKE 1 TABLET BY MOUTH EVERY DAY   Cholecalciferol (VITAMIN D3) 2000 UNITS capsule Take 2,000 Units by mouth daily.    Continuous Blood Gluc Sensor (FREESTYLE LIBRE 2 SENSOR) MISC USE AS DIRECTED. CONTINUOUS BLOOD SENSOR.   doxycycline (VIBRA-TABS) 100 MG tablet Take 1 tablet (100 mg total) by mouth 2 (two) times daily.   glucose blood test strip 1 each by Other route daily. Use as instructed   hydrochlorothiazide (HYDRODIURIL) 25 MG tablet Take 1 tablet (25 mg total) by mouth daily.   meclizine (ANTIVERT) 25 MG tablet Take 1 tablet (25 mg total) by mouth 3 (three) times daily as needed for dizziness.   metFORMIN (GLUCOPHAGE) 1000 MG tablet TAKE 1 TABLET (1,000 MG TOTAL) BY MOUTH 2 (TWO) TIMES DAILY WITH A MEAL.   MULTIPLE VITAMIN PO Take by mouth daily.    OZEMPIC, 0.25 OR 0.5 MG/DOSE, 2 MG/3ML SOPN Please specify directions, refills and quantity   pantoprazole (PROTONIX) 40 MG tablet Take 40 mg by mouth daily.   pioglitazone (ACTOS) 15 MG tablet TAKE 1 TABLET BY MOUTH EVERY DAY   rOPINIRole (REQUIP) 0.25 MG tablet Take by mouth daily.    rosuvastatin (CRESTOR) 20 MG tablet TAKE 1 TABLET BY MOUTH EVERY DAY   Saw Palmetto,  Serenoa repens, (SAW PALMETTO BERRY) 160 MG CAPS Take 2 tablets by mouth daily.    sildenafil (REVATIO) 20 MG tablet Take 3 to 5 tablets two hours before intercouse on an empty stomach.  Do not take with nitrates.   tadalafil (CIALIS) 5 MG tablet Take 1 tablet (5 mg total) by mouth daily.   telmisartan (MICARDIS) 40 MG tablet TAKE 1 TABLET BY MOUTH EVERY DAY   tiZANidine (ZANAFLEX) 4 MG tablet TAKE 1 TABLET BY MOUTH EVERYDAY AT BEDTIME   traMADol (ULTRAM) 50 MG tablet TAKE 1 TABLET BY MOUTH 3 TIMES A DAY AS NEEDED FOR BACK OR LEG PAIN    valACYclovir (VALTREX) 1000 MG tablet Take 1 tablet (1,000 mg total) by mouth 3 (three) times daily.   Vibegron (GEMTESA) 75 MG TABS Take 75 mg by mouth daily.    Social History: Social History   Tobacco Use   Smoking status: Former    Packs/day: 2.00    Years: 15.00    Total pack years: 30.00    Types: Cigarettes    Quit date: 06/25/1974    Years since quitting: 47.6   Smokeless tobacco: Never  Vaping Use   Vaping Use: Never used  Substance Use Topics   Alcohol use: No   Drug use: No    Family Medical History: Family History  Problem Relation Age of Onset   Hypertension Mother    Diabetes Father    CAD Father    Diabetes Sister    Heart disease Sister    CAD Sister     Physical Examination: Vitals:   02/22/22 1408  BP: 134/74    General: Patient is well developed, well nourished, calm, collected, and in no apparent distress. Attention to examination is appropriate.  Neck:   Supple.  Full range of motion.  Respiratory: Patient is breathing without any difficulty.   NEUROLOGICAL:     Awake, alert, oriented to person, place, and time.  Speech is clear and fluent. His voice sound slightly garbled.  He has some difficulty swallowing. Fund of knowledge is appropriate.   Cranial Nerves: Pupils equal round and reactive to light.  Facial tone is symmetric.  Facial sensation is symmetric. Shoulder shrug is symmetric. Tongue protrusion is midline.  There is no pronator drift.  ROM of spine: full.    Strength: Side Biceps Triceps Deltoid Interossei Grip Wrist Ext. Wrist Flex.  R '5 5 5 5 5 5 5  '$ L '5 5 5 5 5 5 5   '$ Side Iliopsoas Quads Hamstring PF DF EHL  R '5 5 5 5 5 5  '$ L '5 5 5 5 5 5   '$ Reflexes are 1+ and symmetric at the biceps, triceps, brachioradialis, patella and achilles.   Hoffman's is absent.  Clonus is not present.  Toes are down-going.  Bilateral upper and lower extremity sensation is intact to light touch.    No evidence of dysmetria noted.  Gait is  normal.    Medical Decision Making  Imaging: CT C spine 02/14/22 IMPRESSION: 1. There are bulky anterior osteophytes at all levels, but most significantly C3-4, C4-5 and C5-6 with the greatest impression on the posterior cervical esophagus at C3-4 and C4-5 possibly explaining the patient's symptoms. 2. There is osteophytic bridging at C2-3 and C6-7, attempted bridging at other levels. 3. Additional posterior endplate spurring, for the most part is nonstenosing except at C6-7 where there is mild encroachment on the ventral cord surface. 4. Multilevel degenerative foraminal stenosis. 5. Degenerative discopathy  greatest at C6-7. 6. Emphysema. 7. Bilaterally ossified stylohyoid ligaments. This may be seen with Eagle's syndrome.     Electronically Signed   By: Telford Nab M.D.   On: 02/16/2022 03:38  Barium Esophagram 11/15/21 Bulky anterior bridging osteophyte at C3-4 and C4-5.   IMPRESSION: 1. Modified barium swallow as described above.   Please refer to the Speech Pathologists report for complete details and recommendations.   This exam was performed by Tsosie Billing PA-C, and was supervised and interpreted by Dr. Posey Pronto.     Electronically Signed   By: Kathreen Devoid M.D.   On: 11/15/2021 15:29 I have personally reviewed the images and agree with the above interpretation.  Assessment and Plan: Manuel Stafford is a pleasant 76 y.o. male with esophageal dysphagia due to cervical spondylosis from C3-C6 with severe osteophyte formation.  He has objective findings concerning for dysphagia and has had multiple bouts of aspiration pneumonia.  At this point, operative removal of the cervical osteophytes is the most appropriate step forward.  I discussed with the patient that this will involve an anterior cervical approach for drilling of the osteophytes and removal until the native vertebral body wall is reestablished.  I discussed the planned procedure at length with the  patient, including the risks, benefits, alternatives, and indications. The risks discussed include but are not limited to bleeding, infection, need for reoperation, vision loss, anesthetic complication, coma, paralysis, and even death. We also discussed the possibility of post-operative dysphagia, vocal cord paralysis. I also described in detail that improvement was not guaranteed.  The patient expressed understanding of these risks, and asked that we proceed with surgery. I described the surgery in layman's terms, and gave ample opportunity for questions, which were answered to the best of my ability.    I spent a total of 30 minutes in face-to-face and non-face-to-face activities related to this patient's care today.  Thank you for involving me in the care of this patient.      Keyshla Tunison K. Izora Ribas MD, [facility] Neurosurgery

## 2022-02-21 NOTE — Progress Notes (Signed)
02/21/2022 9:56 AM   Manuel Stafford 27-May-1946 [id number]  Referring provider: [name], MD [address] [place],  [place] [address]  Chief Complaint  Patient presents with   Urinary Incontinence    HPI: 76 year old male with a personal history of BPH and urinary frequency who presents today to discuss his urinary symptoms.  Since last visit, he underwent cystoscopy which was fairly unremarkable.  Given his symptoms are primarily irritative in nature, he has been on a trial of Gemtesa 75 mg with excellent response.  He is extremely pleased.  He gets up significantly less at night without frequency or urgency.  He has no side effects of the medication.     IPSS     Row Name 02/21/22 1359         International Prostate Symptom Score   How often have you had the sensation of not emptying your bladder? Less than 1 in 5     How often have you had to urinate less than every two hours? Less than 1 in 5 times     How often have you found you stopped and started again several times when you urinated? Not at All     How often have you found it difficult to postpone urination? Less than 1 in 5 times     How often have you had a weak urinary stream? Not at All     How often have you had to strain to start urination? Not at All     How many times did you typically get up at night to urinate? 1 Time     Total IPSS Score 4       Quality of Life due to urinary symptoms   If you were to spend the rest of your life with your urinary condition just the way it is now how would you feel about that? Delighted              Score:  1-7 Mild 8-19 Moderate 20-35 Severe  PMH: Past Medical History:  Diagnosis Date   Back pain    lower   Diabetes mellitus without complication ([place])    Patient takes Metformin.   Elevated lipids    GERD (gastroesophageal reflux disease)    Hemorrhoids 2017   Hyperlipidemia    Hypertension    Kidney stone 2017    Parkinsonian features     Surgical History: Past Surgical History:  Procedure Laterality Date   CIRCUMCISION     COLONOSCOPY  2007   COLONOSCOPY WITH PROPOFOL N/A 08/22/2016   Procedure: COLONOSCOPY WITH PROPOFOL;  Surgeon: Christene Lye, MD;  Location: [facility];  Service: Endoscopy;  Laterality: N/A;   CYSTOSCOPY WITH STENT PLACEMENT Right 09/18/2015   Procedure: CYSTOSCOPY WITH STENT PLACEMENT;  Surgeon: Hollice Espy, MD;  Location: [facility];  Service: Urology;  Laterality: Right;   CYSTOSCOPY WITH URETEROSCOPY, STONE BASKETRY AND STENT PLACEMENT Right 10/03/2015   Procedure: CYSTOSCOPY WITH URETEROSCOPY, STONE BASKETRY AND STENT PLACEMENT;  Surgeon: Hollice Espy, MD;  Location: [facility];  Service: Urology;  Laterality: Right;   EXTRACORPOREAL SHOCK WAVE LITHOTRIPSY Right 09/15/2015   Procedure: EXTRACORPOREAL SHOCK WAVE LITHOTRIPSY (ESWL);  Surgeon: Nickie Retort, MD;  Location: [facility];  Service: Urology;  Laterality: Right;   HERNIA REPAIR     ROTATOR CUFF REPAIR Right 06/2012    Home Medications:  Allergies as of 02/21/2022   No Known Allergies  Medication List        Accurate as of February 21, 2022 11:59 PM. If you have any questions, ask your nurse or doctor.          aspirin 81 MG tablet Take 81 mg by mouth daily.   cetirizine 10 MG tablet Commonly known as: ZYRTEC TAKE 1 TABLET BY MOUTH EVERY DAY   doxycycline 100 MG tablet Commonly known as: VIBRA-TABS Take 1 tablet (100 mg total) by mouth 2 (two) times daily.   FreeStyle Libre 2 Sensor Misc USE AS DIRECTED. CONTINUOUS BLOOD SENSOR.   Gemtesa 75 MG Tabs Generic drug: Vibegron Take 75 mg by mouth daily.   glucose blood test strip 1 each by Other route daily. Use as instructed   hydrochlorothiazide 25 MG tablet Commonly known as: HYDRODIURIL Take 1 tablet (25 mg total) by mouth daily.   meclizine 25 MG tablet Commonly known as: ANTIVERT Take 1 tablet (25 mg total) by mouth 3  (three) times daily as needed for dizziness.   metFORMIN 1000 MG tablet Commonly known as: GLUCOPHAGE TAKE 1 TABLET (1,000 MG TOTAL) BY MOUTH 2 (TWO) TIMES DAILY WITH A MEAL.   MULTIPLE VITAMIN PO Take by mouth daily.   Ozempic (0.25 or 0.5 MG/DOSE) 2 MG/3ML Sopn Generic drug: Semaglutide(0.25 or 0.'5MG'$ /DOS) Please specify directions, refills and quantity   pantoprazole 40 MG tablet Commonly known as: PROTONIX Take 40 mg by mouth daily.   pioglitazone 15 MG tablet Commonly known as: ACTOS TAKE 1 TABLET BY MOUTH EVERY DAY   predniSONE 20 MG tablet Commonly known as: DELTASONE Take 1 tablet (20 mg total) by mouth daily with breakfast.   rOPINIRole 0.25 MG tablet Commonly known as: REQUIP Take by mouth daily.   rosuvastatin 20 MG tablet Commonly known as: CRESTOR TAKE 1 TABLET BY MOUTH EVERY DAY   Saw Palmetto Berry 160 MG Caps Take 2 tablets by mouth daily.   sildenafil 20 MG tablet Commonly known as: REVATIO Take 3 to 5 tablets two hours before intercouse on an empty stomach.  Do not take with nitrates.   tadalafil 5 MG tablet Commonly known as: CIALIS Take 1 tablet (5 mg total) by mouth daily.   tamsulosin 0.4 MG Caps capsule Commonly known as: FLOMAX Take 0.4 mg by mouth daily.   telmisartan 40 MG tablet Commonly known as: MICARDIS TAKE 1 TABLET BY MOUTH EVERY DAY   tiZANidine 4 MG tablet Commonly known as: ZANAFLEX TAKE 1 TABLET BY MOUTH EVERYDAY AT BEDTIME   traMADol 50 MG tablet Commonly known as: ULTRAM TAKE 1 TABLET BY MOUTH 3 TIMES A DAY AS NEEDED FOR BACK OR LEG PAIN   valACYclovir 1000 MG tablet Commonly known as: VALTREX Take 1 tablet (1,000 mg total) by mouth 3 (three) times daily.   Vitamin D3 50 MCG (2000 UT) capsule Take 2,000 Units by mouth daily.        Allergies: No Known Allergies  Family History: Family History  Problem Relation Age of Onset   Hypertension Mother    Diabetes Father    CAD Father    Diabetes Sister     Heart disease Sister    CAD Sister     Social History:  reports that he quit smoking about 47 years ago. His smoking use included cigarettes. He has a 30.00 pack-year smoking history. He has never used smokeless tobacco. He reports that he does not drink alcohol and does not use drugs.   Physical Exam: BP 138/71   Pulse  71   Ht 6' (1.829 m)   Wt 221 lb (100.2 kg)   BMI 29.97 kg/m   Constitutional:  Alert and oriented, No acute distress. HEENT: [place] AT, moist mucus membranes.  Trachea midline, no masses. Cardiovascular: No clubbing, cyanosis, or edema. Respiratory: Normal respiratory effort, no increased work of breathing. Neurologic: Grossly intact, no focal deficits, moving all 4 extremities. Psychiatric: Normal mood and affect.  Laboratory Data: Lab Results  Component Value Date   WBC 8.8 11/07/2021   HGB 14.5 11/07/2021   HCT 43.4 11/07/2021   MCV 95 11/07/2021   PLT 235 11/07/2021    Lab Results  Component Value Date   CREATININE 1.62 (H) 11/07/2021    Lab Results  Component Value Date   PSA 0.3 10/14/2014    Lab Results  Component Value Date   HGBA1C 6.0 (H) 11/07/2021   Results for orders placed or performed in visit on 02/21/22  Bladder Scan (Post Void Residual) in office  Result Value Ref Range   Scan Result 78m      Assessment & Plan:    1. Benign prostatic hyperplasia with urinary frequency Symptoms now almost completely resolved with the Gemtesa and Flomax in combination Emptying bladder well, this is clearly ineffective combination for him Prescription sent to pharmacy, discussed possible concern about cost of medication; if prescription is prohibitively expensive would recommend shopping around on good Rx and or consider changing to OAB medication that is on his formulary.  He will look into this and get back to [place] it is an issue.  Otherwise, we will plan on seeing him in a year with IPSS/PVR. - Urinalysis, Complete - Bladder Scan (Post Void  Residual) in office   Return in about 1 year (around 02/22/2023) for 1 year follow up IPSS/PVR.  AHollice Espy MD  [facility] [address] [place] [phone]

## 2022-02-22 ENCOUNTER — Ambulatory Visit: Payer: HMO | Admitting: Neurosurgery

## 2022-02-22 ENCOUNTER — Encounter: Payer: Self-pay | Admitting: Neurosurgery

## 2022-02-22 VITALS — BP 134/74 | Ht 72.0 in | Wt 219.4 lb

## 2022-02-22 DIAGNOSIS — M47812 Spondylosis without myelopathy or radiculopathy, cervical region: Secondary | ICD-10-CM | POA: Diagnosis not present

## 2022-02-22 DIAGNOSIS — M2578 Osteophyte, vertebrae: Secondary | ICD-10-CM | POA: Diagnosis not present

## 2022-02-22 DIAGNOSIS — R1319 Other dysphagia: Secondary | ICD-10-CM

## 2022-02-22 NOTE — Patient Instructions (Signed)
Please see below for information in regards to your upcoming surgery:  Planned surgery: C3-6 osteophyte removal   Surgery date: 04/04/22 - you will find out your arrival time the business day before your surgery.   Blood thinners: stop aspirin 7 days prior, resume aspirin 14 days after Metformin - should be held for 2 days prior to surgery Ozempic injection - hold for 7 days prior to surgery   Pre-op appointment at [facility]: we will call you with a date/time for this. Pre-admit testing is located on the first floor of [facility], [place], [address].   Pre-op labs may be done at your pre-op appointment. You are not required to fast for these labs.    Should you need to change your pre-op appointment, please call Pre-admit testing at [phone].    If you have FMLA/disability paperwork, please drop it off or fax it to [phone], attention [place].   If you have any questions/concerns before or after surgery, you can reach [place] at [phone], or you can send a mychart message. If you have a concern after hours that cannot wait until normal business hours, you can call [phone] or [phone] and ask the answering service to page the neurosurgeon on call.   Appointments/FMLA & disability paperwork: [place] Nurse: Ophelia Shoulder  Medical assistant: Raquel Sarna Physician Assistant's: Cooper Render & Geronimo Boot Surgeon: Meade Maw, MD

## 2022-02-23 ENCOUNTER — Telehealth: Payer: Self-pay | Admitting: *Deleted

## 2022-02-23 NOTE — Telephone Encounter (Signed)
Tayvien Mccort KeyCyndia Skeeters - Rx #: [id number]  Sent to [facility] Gemtesa '75MG'$  tablets

## 2022-02-28 ENCOUNTER — Other Ambulatory Visit: Payer: Self-pay

## 2022-02-28 ENCOUNTER — Other Ambulatory Visit: Payer: Self-pay | Admitting: Family Medicine

## 2022-02-28 DIAGNOSIS — Z01818 Encounter for other preprocedural examination: Secondary | ICD-10-CM

## 2022-03-06 ENCOUNTER — Encounter: Payer: Self-pay | Admitting: Family Medicine

## 2022-03-06 ENCOUNTER — Ambulatory Visit (INDEPENDENT_AMBULATORY_CARE_PROVIDER_SITE_OTHER): Payer: HMO | Admitting: Family Medicine

## 2022-03-06 VITALS — BP 120/55 | HR 82 | Resp 16 | Wt 220.0 lb

## 2022-03-06 DIAGNOSIS — J189 Pneumonia, unspecified organism: Secondary | ICD-10-CM | POA: Diagnosis not present

## 2022-03-06 DIAGNOSIS — M5136 Other intervertebral disc degeneration, lumbar region: Secondary | ICD-10-CM | POA: Diagnosis not present

## 2022-03-06 DIAGNOSIS — I10 Essential (primary) hypertension: Secondary | ICD-10-CM | POA: Diagnosis not present

## 2022-03-06 DIAGNOSIS — N189 Chronic kidney disease, unspecified: Secondary | ICD-10-CM

## 2022-03-06 DIAGNOSIS — E119 Type 2 diabetes mellitus without complications: Secondary | ICD-10-CM

## 2022-03-06 DIAGNOSIS — J690 Pneumonitis due to inhalation of food and vomit: Secondary | ICD-10-CM | POA: Diagnosis not present

## 2022-03-06 DIAGNOSIS — K760 Fatty (change of) liver, not elsewhere classified: Secondary | ICD-10-CM

## 2022-03-06 DIAGNOSIS — N179 Acute kidney failure, unspecified: Secondary | ICD-10-CM

## 2022-03-06 DIAGNOSIS — E782 Mixed hyperlipidemia: Secondary | ICD-10-CM

## 2022-03-06 DIAGNOSIS — Z23 Encounter for immunization: Secondary | ICD-10-CM

## 2022-03-06 MED ORDER — DOXYCYCLINE HYCLATE 100 MG PO TABS: 100.0000 mg | ORAL_TABLET | Freq: Two times a day (BID) | ORAL | 1 refills | Status: DC

## 2022-03-06 NOTE — Progress Notes (Signed)
Established patient visit  I,[name],acting as a scribe for [name], MD.,have documented all relevant documentation on the behalf of [name], MD,as directed by  [name], MD while in the presence of [name], MD.   Patient: Manuel Stafford.   DOB: 1946-05-22   76 y.o. Male  MRN: [id number] Visit Date: 03/06/2022  Today's healthcare provider: [name], MD   Chief Complaint  Patient presents with   Follow-up   Subjective    HPI  He had a very quick response to doxycycline with last visit.  Like a refill on that just in case he gets an aspiration pneumonitis over the weekend.  This is being addressed as he is having surgery on his cervical spine to correct the swallowing difficulty that he is having.  He is seeing close notes from neurosurgery and GI. Overall he feels well.  Writing medications well and diabetes has not been a problem Follow up for Walking pneumonia:  The patient was last seen for this 1 months ago. Changes made at last visit include; At this time obtain repeat chest x-ray and treat with doxycycline for 5 days.  -----------------------------------------------------------------------------------------   Medications: Outpatient Medications Prior to Visit  Medication Sig   aspirin 81 MG tablet Take 81 mg by mouth daily.    cetirizine (ZYRTEC) 10 MG tablet TAKE 1 TABLET BY MOUTH EVERY DAY   Cholecalciferol (VITAMIN D3) 2000 UNITS capsule Take 2,000 Units by mouth daily.    Continuous Blood Gluc Sensor (FREESTYLE LIBRE 2 SENSOR) MISC USE AS DIRECTED. CONTINUOUS BLOOD SENSOR.   glucose blood test strip 1 each by Other route daily. Use as instructed   hydrochlorothiazide (HYDRODIURIL) 25 MG tablet Take 1 tablet (25 mg total) by mouth daily.   meclizine (ANTIVERT) 25 MG tablet Take 1 tablet (25 mg total) by mouth 3 (three) times daily as needed for dizziness.   metFORMIN (GLUCOPHAGE) 1000 MG tablet TAKE 1  TABLET (1,000 MG TOTAL) BY MOUTH 2 (TWO) TIMES DAILY WITH A MEAL.   MULTIPLE VITAMIN PO Take by mouth daily.    OZEMPIC, 0.25 OR 0.5 MG/DOSE, 2 MG/3ML SOPN Please specify directions, refills and quantity   pantoprazole (PROTONIX) 40 MG tablet Take 40 mg by mouth daily.   pioglitazone (ACTOS) 15 MG tablet TAKE 1 TABLET BY MOUTH EVERY DAY   rOPINIRole (REQUIP) 0.25 MG tablet Take by mouth daily.    rosuvastatin (CRESTOR) 20 MG tablet TAKE 1 TABLET BY MOUTH EVERY DAY   Saw Palmetto, Serenoa repens, (SAW PALMETTO BERRY) 160 MG CAPS Take 2 tablets by mouth daily.    sildenafil (REVATIO) 20 MG tablet Take 3 to 5 tablets two hours before intercouse on an empty stomach.  Do not take with nitrates.   tadalafil (CIALIS) 5 MG tablet Take 1 tablet (5 mg total) by mouth daily.   telmisartan (MICARDIS) 40 MG tablet TAKE 1 TABLET BY MOUTH EVERY DAY   tiZANidine (ZANAFLEX) 4 MG tablet TAKE 1 TABLET BY MOUTH EVERYDAY AT BEDTIME   traMADol (ULTRAM) 50 MG tablet TAKE 1 TABLET BY MOUTH 3 TIMES A DAY AS NEEDED FOR BACK OR LEG PAIN   valACYclovir (VALTREX) 1000 MG tablet Take 1 tablet (1,000 mg total) by mouth 3 (three) times daily.   Vibegron (GEMTESA) 75 MG TABS Take 75 mg by mouth daily.   [DISCONTINUED] doxycycline (VIBRA-TABS) 100 MG tablet Take 1 tablet (100 mg total) by mouth 2 (two) times daily. (  Patient not taking: Reported on 03/06/2022)   No facility-administered medications prior to visit.    Review of Systems  Constitutional:  Negative for appetite change, chills and fever.  Respiratory:  Negative for chest tightness, shortness of breath and wheezing.   Cardiovascular:  Negative for chest pain and palpitations.  Gastrointestinal:  Negative for abdominal pain, nausea and vomiting.    Last hemoglobin A1c Lab Results  Component Value Date   HGBA1C 6.0 (H) 11/07/2021       Objective    BP (!) 120/55 (BP Location: Right Arm, Patient Position: Sitting, Cuff Size: Large)   Pulse 82   Resp 16    Wt 220 lb (99.8 kg)   SpO2 93%   BMI 29.84 kg/m  BP Readings from Last 3 Encounters:  03/06/22 (!) 120/55  02/22/22 134/74  02/21/22 138/71   Wt Readings from Last 3 Encounters:  03/06/22 220 lb (99.8 kg)  02/22/22 219 lb 6.4 oz (99.5 kg)  02/21/22 221 lb (100.2 kg)      Physical Exam Vitals reviewed.  Constitutional:      General: He is not in acute distress.    Appearance: He is well-developed.  HENT:     Head: Normocephalic and atraumatic.     Right Ear: Hearing normal.     Left Ear: Hearing normal.     Nose: Nose normal.  Eyes:     General: Lids are normal. No scleral icterus.       Right eye: No discharge.        Left eye: No discharge.     Conjunctiva/sclera: Conjunctivae normal.  Cardiovascular:     Rate and Rhythm: Normal rate and regular rhythm.     Heart sounds: Normal heart sounds.  Pulmonary:     Effort: Pulmonary effort is normal. No respiratory distress.  Skin:    Findings: No lesion or rash.  Neurological:     General: No focal deficit present.     Mental Status: He is alert and oriented to person, place, and time.  Psychiatric:        Mood and Affect: Mood normal.        Speech: Speech normal.        Behavior: Behavior normal.        Thought Content: Thought content normal.        Judgment: Judgment normal.       No results found for any visits on 03/06/22.  Assessment & Plan     1. Walking pneumonia Doxy prescription is refilled just in case he develops another case of pneumonitis. Surgery on his neck will solve his issue. - doxycycline (VIBRA-TABS) 100 MG tablet; Take 1 tablet (100 mg total) by mouth 2 (two) times daily.  Dispense: 10 tablet; Refill: 1  2. Need for influenza vaccination  - Flu Vaccine QUAD High Dose(Fluad)  3. Benign essential HTN   4. Recurrent aspiration pneumonia ([place]) Above. 5. Fatty liver disease, nonalcoholic Doing well weight loss.  BMI down to down below 30  6. Type 2 diabetes mellitus without  complication, without long-term current use of insulin (HCC) A1c was 6.0.  7. DDD (degenerative disc disease), lumbar   8. Acute kidney injury superimposed on chronic kidney disease (HCC) GFR 43 with chronic kidney disease.  Nonsteroidals. Would stop metformin.  9. Mixed hyperlipidemia    No follow-ups on file.      I, [name], MD, have reviewed all documentation for this visit. The documentation on 03/10/22  for the exam, diagnosis, procedures, and orders are all accurate and complete.    Nyjai Graff Cranford Mon, MD  [facility] [phone] (phone) [phone] (fax)  [place]

## 2022-03-06 NOTE — Patient Instructions (Signed)
SEE [facility] OR DR. Quentin Cornwall IF YOU NEED ANYTHING.

## 2022-03-22 ENCOUNTER — Inpatient Hospital Stay: Admission: RE | Admit: 2022-03-22 | Payer: HMO | Source: Ambulatory Visit

## 2022-03-26 ENCOUNTER — Encounter
Admission: RE | Admit: 2022-03-26 | Discharge: 2022-03-26 | Disposition: A | Discharge status: Home / Self Care | Payer: HMO | Source: Ambulatory Visit | Attending: Neurosurgery | Admitting: Neurosurgery

## 2022-03-26 VITALS — BP 123/73 | HR 69 | Resp 14 | Ht 72.0 in | Wt 224.9 lb

## 2022-03-26 DIAGNOSIS — Z01818 Encounter for other preprocedural examination: Secondary | ICD-10-CM | POA: Diagnosis not present

## 2022-03-26 DIAGNOSIS — I10 Essential (primary) hypertension: Secondary | ICD-10-CM | POA: Diagnosis not present

## 2022-03-26 DIAGNOSIS — I4510 Unspecified right bundle-branch block: Secondary | ICD-10-CM | POA: Diagnosis not present

## 2022-03-26 DIAGNOSIS — K760 Fatty (change of) liver, not elsewhere classified: Secondary | ICD-10-CM

## 2022-03-26 DIAGNOSIS — E119 Type 2 diabetes mellitus without complications: Secondary | ICD-10-CM | POA: Diagnosis not present

## 2022-03-26 HISTORY — DX: Hypertensive heart disease without heart failure: I11.9

## 2022-03-26 HISTORY — DX: Atherosclerotic heart disease of native coronary artery without angina pectoris: I25.10

## 2022-03-26 HISTORY — DX: Other intervertebral disc degeneration, lumbar region: M51.36

## 2022-03-26 HISTORY — DX: Tremor, unspecified: R25.1

## 2022-03-26 HISTORY — DX: Pneumonitis due to inhalation of food and vomit: J69.0

## 2022-03-26 HISTORY — DX: Personal history of urinary calculi: Z87.442

## 2022-03-26 HISTORY — DX: Other intervertebral disc degeneration, lumbar region without mention of lumbar back pain or lower extremity pain: M51.369

## 2022-03-26 HISTORY — DX: Fatty (change of) liver, not elsewhere classified: K76.0

## 2022-03-26 HISTORY — DX: Other nonspecific abnormal finding of lung field: R91.8

## 2022-03-26 LAB — COMPREHENSIVE METABOLIC PANEL
ALT: 22 U/L (ref 0–44)
AST: 28 U/L (ref 15–41)
Albumin: 3.9 g/dL (ref 3.5–5.0)
Alkaline Phosphatase: 91 U/L (ref 38–126)
Anion gap: 6 (ref 5–15)
BUN: 23 mg/dL (ref 8–23)
CO2: 29 mmol/L (ref 22–32)
Calcium: 9.4 mg/dL (ref 8.9–10.3)
Chloride: 102 mmol/L (ref 98–111)
Creatinine, Ser: 1.38 mg/dL — ABNORMAL HIGH (ref 0.61–1.24)
GFR, Estimated: 53 mL/min — ABNORMAL LOW (ref >60)
Glucose, Bld: 95 mg/dL (ref 70–99)
Potassium: 3.9 mmol/L (ref 3.5–5.1)
Sodium: 137 mmol/L (ref 135–145)
Total Bilirubin: 0.8 mg/dL (ref 0.3–1.2)
Total Protein: 7.4 g/dL (ref 6.5–8.1)

## 2022-03-26 LAB — CBC
HCT: 41.9 % (ref 39.0–52.0)
Hemoglobin: 13.8 g/dL (ref 13.0–17.0)
MCH: 31.9 pg (ref 26.0–34.0)
MCHC: 32.9 g/dL (ref 30.0–36.0)
MCV: 97 fL (ref 80.0–100.0)
Platelets: 199 10*3/uL (ref 150–400)
RBC: 4.32 MIL/uL (ref 4.22–5.81)
RDW: 12.9 % (ref 11.5–15.5)
WBC: 7.5 10*3/uL (ref 4.0–10.5)
nRBC: 0 % (ref 0.0–0.2)

## 2022-03-26 LAB — SURGICAL PCR SCREEN
MRSA, PCR: NEGATIVE
Staphylococcus aureus: NEGATIVE

## 2022-03-26 LAB — TYPE AND SCREEN
ABO/RH(D): O POS
Antibody Screen: NEGATIVE

## 2022-03-26 NOTE — Patient Instructions (Addendum)
Your procedure is scheduled on:04-04-22 Wednesday Report to the Registration Desk on the 1st floor of the [facility].Then proceed to the 2nd floor Surgery Desk To find out your arrival time, please call [phone] between 1PM - 3PM on:04-04-22 Tuesday If your arrival time is 6:00 am, do not arrive prior to that time as the [facility] entrance doors do not open until 6:00 am.  REMEMBER: Instructions that are not followed completely may result in serious medical risk, up to and including death; or upon the discretion of your surgeon and anesthesiologist your surgery may need to be rescheduled.  Do not eat food after midnight the night before surgery.  No gum chewing, lozengers or hard candies.  You may however, drink Water up to 2 hours before you are scheduled to arrive for your surgery. Do not drink anything within 2 hours of your scheduled arrival time.  Type 1 and Type 2 diabetics should only drink water.  TAKE THESE MEDICATIONS THE MORNING OF SURGERY WITH A SIP OF WATER: -cetirizine (ZYRTEC)  -pantoprazole (PROTONIX) -take one the night before and one on the morning of surgery - helps to prevent nausea after surgery.)  Stop your metFORMIN (GLUCOPHAGE) 2 days prior to surgery-Last dose will be on 04-01-22 Sunday  Stop your OZEMPIC 7 days prior to surgery  Stop your 81 mg Aspirin 7 days prior to surgery-Last dose will be on 03-27-22 Tuesday  One week prior to surgery: Stop Anti-inflammatories (NSAIDS) such as Advil, Aleve, Ibuprofen, Motrin, Naproxen, Naprosyn and Aspirin based products such as Excedrin, Goodys Powder, BC Powder.You may however, take Tylenol/Tramadol if needed for pain up until the day of surgery.  Stop ANY OVER THE COUNTER supplements/vitamins 7 days prior to surgery (Multivitamin, vitamin D3, Saw Palmetto  No Alcohol for 24 hours before or after surgery.  No Smoking including e-cigarettes for 24 hours prior to surgery.  No chewable tobacco products for at  least 6 hours prior to surgery.  No nicotine patches on the day of surgery.  Do not use any "recreational" drugs for at least a week prior to your surgery.  Please be advised that the combination of cocaine and anesthesia may have negative outcomes, up to and including death. If you test positive for cocaine, your surgery will be cancelled.  On the morning of surgery brush your teeth with toothpaste and water, you may rinse your mouth with mouthwash if you wish. Do not swallow any toothpaste or mouthwash.  Use CHG Soap as directed on instruction sheet.  Do not wear jewelry, make-up, hairpins, clips or nail polish.  Do not wear lotions, powders, or perfumes.   Do not shave body from the neck down 48 hours prior to surgery just in case you cut yourself which could leave a site for infection.  Also, freshly shaved skin may become irritated if using the CHG soap.  Contact lenses, hearing aids and dentures may not be worn into surgery.  Do not bring valuables to the hospital. [facility] is not responsible for any missing/lost belongings or valuables.   Notify your doctor if there is any change in your medical condition (cold, fever, infection).  Wear comfortable clothing (specific to your surgery type) to the hospital.  After surgery, you can help prevent lung complications by doing breathing exercises.  Take deep breaths and cough every 1-2 hours. Your doctor may order a device called an Incentive Spirometer to help you take deep breaths. When coughing or sneezing, hold a pillow firmly against  your incision with both hands. This is called "splinting." Doing this helps protect your incision. It also decreases belly discomfort.  If you are being admitted to the hospital overnight, leave your suitcase in the car. After surgery it may be brought to your room.  If you are being discharged the day of surgery, you will not be allowed to drive home. You will need a responsible adult (18 years  or older) to drive you home and stay with you that night.   If you are taking public transportation, you will need to have a responsible adult (18 years or older) with you. Please confirm with your physician that it is acceptable to use public transportation.   Please call the [facility] Dept. at [phone] if you have any questions about these instructions.  Surgery Visitation Policy:  Patients undergoing a surgery or procedure may have two family members or support persons with them as long as the person is not COVID-19 positive or experiencing its symptoms.   Inpatient Visitation:    Visiting hours are 7 a.m. to 8 p.m. Up to four visitors are allowed at one time in a patient room, including children. The visitors may rotate out with other people during the day. One designated support person (adult) may remain overnight.

## 2022-03-27 ENCOUNTER — Ambulatory Visit: Payer: HMO | Admitting: Neurosurgery

## 2022-03-27 VITALS — BP 117/62 | HR 87 | Ht 72.0 in | Wt 223.4 lb

## 2022-03-27 DIAGNOSIS — R1319 Other dysphagia: Secondary | ICD-10-CM

## 2022-03-27 NOTE — Progress Notes (Signed)
Referring Physician:  [name], MD No address on file  Primary Physician:  [name], MD  History of Present Illness: 03/27/2022 He is here today to answer additional questions for surgery.  02/22/2022 [name] is here today with a chief complaint of dysphagia due to a cervical osteophyte, this has worsened over the past 6 months.  Past Surgery: denies  [name]. has no symptoms of cervical myelopathy.  The symptoms are causing a significant impact on the patient's life.   Review of Systems:  A 10 point review of systems is negative, except for the pertinent positives and negatives detailed in the HPI.  Past Medical History: Past Medical History:  Diagnosis Date   Abnormal EKG    Back pain    lower   Coronary artery disease    DDD (degenerative disc disease), lumbar    Diabetes mellitus without complication ([place])    Patient takes Metformin.   Elevated lipids    Fatty liver disease, nonalcoholic    GERD (gastroesophageal reflux disease)    Hemorrhoids 2017   History of kidney stones    Hyperlipidemia    Hypertension    Hypertensive left ventricular hypertrophy    Lung nodules    Parkinsonian features    Recurrent aspiration pneumonia (HCC)    Sleep apnea    does not use cpap   Tremor     Past Surgical History: Past Surgical History:  Procedure Laterality Date   CIRCUMCISION     COLONOSCOPY  2007   COLONOSCOPY WITH PROPOFOL N/A 08/22/2016   Procedure: COLONOSCOPY WITH PROPOFOL;  Surgeon: Christene Lye, MD;  Location: [facility] ENDOSCOPY;  Service: Endoscopy;  Laterality: N/A;   CYSTOSCOPY WITH STENT PLACEMENT Right 09/18/2015   Procedure: CYSTOSCOPY WITH STENT PLACEMENT;  Surgeon: Hollice Espy, MD;  Location: [facility] ORS;  Service: Urology;  Laterality: Right;   CYSTOSCOPY WITH URETEROSCOPY, STONE BASKETRY AND STENT PLACEMENT Right 10/03/2015   Procedure: CYSTOSCOPY WITH URETEROSCOPY, STONE BASKETRY AND  STENT PLACEMENT;  Surgeon: Hollice Espy, MD;  Location: [facility] ORS;  Service: Urology;  Laterality: Right;   EXTRACORPOREAL SHOCK WAVE LITHOTRIPSY Right 09/15/2015   Procedure: EXTRACORPOREAL SHOCK WAVE LITHOTRIPSY (ESWL);  Surgeon: Nickie Retort, MD;  Location: [facility] ORS;  Service: Urology;  Laterality: Right;   HERNIA REPAIR     ROTATOR CUFF REPAIR Right 06/2012    Allergies: Allergies as of 03/27/2022   (No Known Allergies)    Medications: No outpatient medications have been marked as taking for the 03/27/22 encounter (Office Visit) with Meade Maw, MD.    Social History: Social History   Tobacco Use   Smoking status: Former    Packs/day: 2.00    Years: 15.00    Total pack years: 30.00    Types: Cigarettes    Quit date: 06/25/1974    Years since quitting: 47.7   Smokeless tobacco: Never  Vaping Use   Vaping Use: Never used  Substance Use Topics   Alcohol use: Yes    Comment: occ wine   Drug use: No    Family Medical History: Family History  Problem Relation Age of Onset   Hypertension Mother    Diabetes Father    CAD Father    Diabetes Sister    Heart disease Sister    CAD Sister     Physical Examination: Vitals:   03/27/22 1509  BP: 117/62  Pulse: 87    General: Patient is well developed,  well nourished, calm, collected, and in no apparent distress. Attention to examination is appropriate.  Neck:   Supple.  Full range of motion.  Respiratory: Patient is breathing without any difficulty.   NEUROLOGICAL:     Awake, alert, oriented to person, place, and time.  Speech is clear and fluent. His voice sound slightly garbled.  He has some difficulty swallowing. Fund of knowledge is appropriate.   Cranial Nerves: Pupils equal round and reactive to light.  Facial tone is symmetric.  Facial sensation is symmetric. Shoulder shrug is symmetric. Tongue protrusion is midline.  There is no pronator drift.  ROM of spine: full.    Strength: Side Biceps  Triceps Deltoid Interossei Grip Wrist Ext. Wrist Flex.  R '5 5 5 5 5 5 5  '$ L '5 5 5 5 5 5 5   '$ Side Iliopsoas Quads Hamstring PF DF EHL  R '5 5 5 5 5 5  '$ L '5 5 5 5 5 5   '$ Reflexes are 1+ and symmetric at the biceps, triceps, brachioradialis, patella and achilles.   Hoffman's is absent.  Clonus is not present.  Toes are down-going.  Bilateral upper and lower extremity sensation is intact to light touch.    No evidence of dysmetria noted.  Gait is normal.    Medical Decision Making  Imaging: CT C spine 02/14/22 IMPRESSION: 1. There are bulky anterior osteophytes at all levels, but most significantly C3-4, C4-5 and C5-6 with the greatest impression on the posterior cervical esophagus at C3-4 and C4-5 possibly explaining the patient's symptoms. 2. There is osteophytic bridging at C2-3 and C6-7, attempted bridging at other levels. 3. Additional posterior endplate spurring, for the most part is nonstenosing except at C6-7 where there is mild encroachment on the ventral cord surface. 4. Multilevel degenerative foraminal stenosis. 5. Degenerative discopathy greatest at C6-7. 6. Emphysema. 7. Bilaterally ossified stylohyoid ligaments. This may be seen with Eagle's syndrome.     Electronically Signed   By: Telford Nab M.D.   On: 02/16/2022 03:38  Barium Esophagram 11/15/21 Bulky anterior bridging osteophyte at C3-4 and C4-5.   IMPRESSION: 1. Modified barium swallow as described above.   Please refer to the Speech Pathologists report for complete details and recommendations.   This exam was performed by Tsosie Billing PA-C, and was supervised and interpreted by Dr. Posey Pronto.     Electronically Signed   By: Kathreen Devoid M.D.   On: 11/15/2021 15:29 I have personally reviewed the images and agree with the above interpretation.  Assessment and Plan: Manuel Stafford is a pleasant 76 y.o. male with esophageal dysphagia due to cervical spondylosis from C3-C6 with severe osteophyte  formation.  He has objective findings concerning for dysphagia and has had multiple bouts of aspiration pneumonia.  At this point, operative removal of the cervical osteophytes is the most appropriate step forward.  I discussed with the patient that this will involve an anterior cervical approach for drilling of the osteophytes and removal until the native vertebral body wall is reestablished.  I discussed the planned procedure at length with the patient, including the risks, benefits, alternatives, and indications. The risks discussed include but are not limited to bleeding, infection, need for reoperation, vision loss, anesthetic complication, coma, paralysis, and even death. We also discussed the possibility of post-operative dysphagia, vocal cord paralysis. I also described in detail that improvement was not guaranteed.  The patient expressed understanding of these risks, and asked that we proceed with surgery. I described the surgery  in layman's terms, and gave ample opportunity for questions, which were answered to the best of my ability.    I spent a total of 30 minutes in face-to-face and non-face-to-face activities related to this patient's care today.  Thank you for involving me in the care of this patient.      Lianni Kanaan K. Izora Ribas MD, [facility] Neurosurgery

## 2022-03-27 NOTE — Progress Notes (Signed)
  Perioperative Services Pre-Admission/Anesthesia Testing    Date: 03/27/22  Name: [facility]. MRN:   [id number]  Re: GLP-1 clearance and provider recommendations   Planned Surgical Procedure(s):    Case: [id number] Date/Time: 04/04/22 1207   Procedure: C3-6 OSTEOPHYTE REMOVAL-ANTERIOR CERVICAL DECOMPRESSION/DISCECTOMY FUSION 3 LEVELS   Anesthesia type: Choice   Pre-op diagnosis:      Cervical spondylosis without myelopathy M47.812     Cervical osteophyte M25.78     Esophageal dysphagia  R13.19   Location: [facility] OR ROOM 03 / [facility]   Surgeons: Meade Maw, MD   Clinical Notes:  Patient is scheduled for the above procedure on 10/11/203 with Dr. Meade Maw, MD. In review of his medication reconciliation it was noted that patient is on a prescribed GLP-1 medication. Per guidelines issued by the American Society of Anesthesiologists (ASA), it is recommended that these medications be held for 7 days prior to the patient undergoing any type of elective surgical procedure. The patient is taking the following GLP-1 medication:  '[x]'$  SEMAGLUTIDE (Ozempic, Rybelsus)  '[]'$  EXENATIDE (Bydureon, Byetta) '[]'$  LIRAGLUTIDE (Victoza, Saxenda)  '[]'$  LIXISENATIDE (Adalyxin) '[]'$  DULAGLUTIDE (Trulicity)    '[]'$  OTHER GLP-1 medication: _______________  Jeralene Huff out to prescribing provider Rosanna Randy, MD) to make them aware of the guidelines from anesthesia. Given that this patient takes the prescribed GLP-1 medication for his  diabetes diagnosis, rather than for weight loss, recommendations from the prescribing provider were solicited. Prescribing provider made aware of the following so that informed decision/POC can be developed for this patient that may be taking medications belonging to these drug classes:  Oral GLP-1 medications will be held 1 day prior to surgery.  Injectable GLP-1 medications will be held 7 days prior to surgery.  Metformin is routinely held 48 hours  prior to surgery due to renal concerns, potential need for contrasted imaging perioperatively, and the potential for tissue hypoxia leading to drug induced lactic acidosis.  All SGLT2i medications are held 72 hours prior to surgery as they can be associated with the increased potential for developing euglycemic diabetic ketoacidosis (EDKA).   Impression and Plan:  Melinda R Maryan Char. is on a prescribed GLP-1 medication, which induces the known side effect of decreased gastric emptying. Efforts are bring made to mitigate the risk of perioperative hyperglycemic events, as elevated blood glucose levels have been found to contribute to intra/postoperative complications. Additionally, hyperglycemic extremes can potentially necessitate the postponing of a patient's elective case in order to better optimize perioperative glycemic control, again with the aforementioned guidelines in place. With this in mind, recommendations have been sought from the prescribing provider, who has cleared patient to proceed with holding the prescribed GLP-1 as per the guidelines from the ASA.   Provider recommending: no further recommendations received from the prescribing provider.  Copy of signed clearance and recommendations placed on patient's chart for inclusion in their medical record and for review by the surgical/anesthetic team on the day of his procedure.   Honor Loh, MSN, APRN, FNP-C, CEN [facility]  Peri-operative Services Nurse Practitioner Phone: [phone] 03/27/22 4:59 PM  NOTE: This note has been prepared using Dragon dictation software. Despite my best ability to proofread, there is always the potential that unintentional transcriptional errors may still occur from this process.

## 2022-03-27 NOTE — H&P (View-Only) (Signed)
Referring Physician:  Jerrol Stafford., MD No address on file  Primary Physician:  Manuel Stafford., MD  History of Present Illness: 03/27/2022 He is here today to answer additional questions for surgery.  02/22/2022 [name] is here today with a chief complaint of dysphagia due to a cervical osteophyte, this has worsened over the past 6 months.  Past Surgery: denies  Manuel Stafford. has no symptoms of cervical myelopathy.  The symptoms are causing a significant impact on the patient's life.   Review of Systems:  A 10 point review of systems is negative, except for the pertinent positives and negatives detailed in the HPI.  Past Medical History: Past Medical History:  Diagnosis Date   Abnormal EKG    Back pain    lower   Coronary artery disease    DDD (degenerative disc disease), lumbar    Diabetes mellitus without complication ([place])    Patient takes Metformin.   Elevated lipids    Fatty liver disease, nonalcoholic    GERD (gastroesophageal reflux disease)    Hemorrhoids 2017   History of kidney stones    Hyperlipidemia    Hypertension    Hypertensive left ventricular hypertrophy    Lung nodules    Parkinsonian features    Recurrent aspiration pneumonia (HCC)    Sleep apnea    does not use cpap   Tremor     Past Surgical History: Past Surgical History:  Procedure Laterality Date   CIRCUMCISION     COLONOSCOPY  2007   COLONOSCOPY WITH PROPOFOL N/A 08/22/2016   Procedure: COLONOSCOPY WITH PROPOFOL;  Surgeon: Manuel Lye, MD;  Location: [facility] END[facility];  Service: Endoscopy;  Laterality: N/A;   CYSTOSCOPY WITH STENT PLACEMENT Right 09/18/2015   Procedure: CYSTOSCOPY WITH STENT PLACEMENT;  Surgeon: Manuel Espy, MD;  Location: [facility] ORS;  Service: Urology;  Laterality: Right;   CYSTOSCOPY WITH URETEROSCOPY, STONE BASKETRY AND STENT PLACEMENT Right 10/03/2015   Procedure: CYSTOSCOPY WITH URETEROSCOPY, STONE BASKETRY AND  STENT PLACEMENT;  Surgeon: Manuel Espy, MD;  Location: [facility] ORS;  Service: Urology;  Laterality: Right;   EXTRACORPOREAL SHOCK WAVE LITHOTRIPSY Right 09/15/2015   Procedure: EXTRACORPOREAL SHOCK WAVE LITHOTRIPSY (ESWL);  Surgeon: Manuel Retort, MD;  Location: [facility] ORS;  Service: Urology;  Laterality: Right;   HERNIA REPAIR     ROTATOR CUFF REPAIR Right 06/2012    Allergies: Allergies as of 03/27/2022   (No Known Allergies)    Medications: No outpatient medications have been marked as taking for the 03/27/22 encounter (Office Visit) with Manuel Maw, MD.    Social History: Social History   Tobacco Use   Smoking status: Former    Packs/day: 2.00    Years: 15.00    Total pack years: 30.00    Types: Cigarettes    Quit date: 06/25/1974    Years since quitting: 47.7   Smokeless tobacco: Never  Vaping Use   Vaping Use: Never used  Substance Use Topics   Alcohol use: Yes    Comment: occ wine   Drug use: No    Family Medical History: Family History  Problem Relation Age of Onset   Hypertension Mother    Diabetes Father    CAD Father    Diabetes Sister    Heart disease Sister    CAD Sister     Physical Examination: Vitals:   03/27/22 1509  BP: 117/62  Pulse: 87    General: Patient is well developed,  well nourished, calm, collected, and in no apparent distress. Attention to examination is appropriate.  Neck:   Supple.  Full range of motion.  Respiratory: Patient is breathing without any difficulty.   NEUROLOGICAL:     Awake, alert, oriented to person, place, and time.  Speech is clear and fluent. His voice sound slightly garbled.  He has some difficulty swallowing. Fund of knowledge is appropriate.   Cranial Nerves: Pupils equal round and reactive to light.  Facial tone is symmetric.  Facial sensation is symmetric. Shoulder shrug is symmetric. Tongue protrusion is midline.  There is no pronator drift.  ROM of spine: full.    Strength: Side Biceps  Triceps Deltoid Interossei Grip Wrist Ext. Wrist Flex.  R '5 5 5 5 5 5 5  '$ L '5 5 5 5 5 5 5   '$ Side Iliopsoas Quads Hamstring PF DF EHL  R '5 5 5 5 5 5  '$ L '5 5 5 5 5 5   '$ Reflexes are 1+ and symmetric at the biceps, triceps, brachioradialis, patella and achilles.   Hoffman's is absent.  Clonus is not present.  Toes are down-going.  Bilateral upper and lower extremity sensation is intact to light touch.    No evidence of dysmetria noted.  Gait is normal.    Medical Decision Making  Imaging: CT C spine 02/14/22 IMPRESSION: 1. There are bulky anterior osteophytes at all levels, but most significantly C3-4, C4-5 and C5-6 with the greatest impression on the posterior cervical esophagus at C3-4 and C4-5 possibly explaining the patient's symptoms. 2. There is osteophytic bridging at C2-3 and C6-7, attempted bridging at other levels. 3. Additional posterior endplate spurring, for the most part is nonstenosing except at C6-7 where there is mild encroachment on the ventral cord surface. 4. Multilevel degenerative foraminal stenosis. 5. Degenerative discopathy greatest at C6-7. 6. Emphysema. 7. Bilaterally ossified stylohyoid ligaments. This may be seen with Eagle's syndrome.     Electronically Signed   By: Manuel Stafford M.D.   On: 02/16/2022 03:38  Barium Esophagram 11/15/21 Bulky anterior bridging osteophyte at C3-4 and C4-5.   IMPRESSION: 1. Modified barium swallow as described above.   Please refer to the Speech Pathologists report for complete details and recommendations.   This exam was performed by Manuel Billing PA-C, and was supervised and interpreted by Manuel Stafford.     Electronically Signed   By: Manuel Stafford M.D.   On: 11/15/2021 15:29 I have personally reviewed the images and agree with the above interpretation.  Assessment and Plan: [name] is a pleasant 76 y.o. male with esophageal dysphagia due to cervical spondylosis from C3-C6 with severe osteophyte  formation.  He has objective findings concerning for dysphagia and has had multiple bouts of aspiration pneumonia.  At this point, operative removal of the cervical osteophytes is the most appropriate step forward.  I discussed with the patient that this will involve an anterior cervical approach for drilling of the osteophytes and removal until the native vertebral body wall is reestablished.  I discussed the planned procedure at length with the patient, including the risks, benefits, alternatives, and indications. The risks discussed include but are not limited to bleeding, infection, need for reoperation, vision loss, anesthetic complication, coma, paralysis, and even death. We also discussed the possibility of post-operative dysphagia, vocal cord paralysis. I also described in detail that improvement was not guaranteed.  The patient expressed understanding of these risks, and asked that we proceed with surgery. I described the surgery  in layman's terms, and gave ample opportunity for questions, which were answered to the best of my ability.    I spent a total of 30 minutes in face-to-face and non-face-to-face activities related to this patient's care today.  Thank you for involving me in the care of this patient.      [facility] K. Izora Ribas MD, [facility] Neurosurgery

## 2022-03-29 ENCOUNTER — Encounter: Payer: Self-pay | Admitting: Neurosurgery

## 2022-03-29 NOTE — Progress Notes (Signed)
Perioperative Services  Pre-Admission/Anesthesia Testing Clinical Review  Date: 03/30/22  Patient Demographics:  Name: Manuel Stafford [facility]. DOB:   1946-06-14 MRN:   [id number]  Planned Surgical Procedure(s):    Case: [id number] Date/Time: 04/04/22 1207   Procedure: C3-6 OSTEOPHYTE REMOVAL-ANTERIOR CERVICAL DECOMPRESSION/DISCECTOMY FUSION 3 LEVELS   Anesthesia type: Choice   Pre-op diagnosis:      Cervical spondylosis without myelopathy M47.812     Cervical osteophyte M25.78     Esophageal dysphagia  R13.19   Location: [facility] OR ROOM 03 / [place] [facility]   Surgeons: Meade Maw, MD     NOTE: Available PAT nursing documentation and vital signs have been reviewed. Clinical nursing staff has updated patient's PMH/PSHx, current medication list, and drug allergies/intolerances to ensure comprehensive history available to assist in medical decision making as it pertains to the aforementioned surgical procedure and anticipated anesthetic course. Extensive review of available clinical information performed. [facility] and PSHx updated with any diagnoses/procedures that  may have been inadvertently omitted during his intake with the pre-admission testing department's nursing staff.  Clinical Discussion:  Manuel Stafford. is a 76 y.o. male who is submitted for pre-surgical anesthesia review and clearance prior to him undergoing the above procedure. Patient is a Former Smoker (30 pack years; quit 06/1974). Pertinent PMH includes: CAD, hypertensive heart disease, diastolic dysfunction, RBBB, aortic atherosclerosis, HTN, HLD, T2DM, GERD (on daily PPI), lung nodules, recurrent aspiration pneumonia, ED (on PDE5i), OA, cervical osteophytes, lumbar DDD, lumbar spondylosis, chronic back pain, parkinsonian features, RLS, essential tremors, anxiety, depression.  Patient is followed by cardiology Nehemiah Massed, MD). He was last seen in the cardiology clinic on 12/20/2021; notes  reviewed. At the time of his clinic visit, patient reported to be doing well overall from a cardiovascular perspective.  He denied any chest pain, shortness of breath, PND, orthopnea, palpitations, significant peripheral edema, vertiginous symptoms, or presyncope/syncope.  Patient with a past medical history significant for cardiovascular diagnoses.  TTE performed on 05/31/2021 revealed a normal left ventricular systolic function with an EF of >55%. Left ventricular diastolic Doppler parameters consistent with abnormal relaxation (G1DD).  There was mild biatrial and RIGHT ventricular enlargement.  Right ventricular systolic function normal.  There was trivial to mild mitral, tricuspid, and pulmonary valve regurgitation.  There was no evidence of a significant transvalvular gradient to suggest stenosis.  Myocardial perfusion imaging study performed on 05/31/2021 revealed normal left ventricular systolic function with a hyperdynamic LVEF of 68%.  There were no regional wall motion abnormalities.  There was no evidence of stress-induced myocardial ischemia or arrhythmia; no scintigraphic evidence of scar.  Study determined to be normal and low risk.  Blood pressure reasonably controlled at 130/80 on currently prescribed diuretic (HCTZ) and ARB (telmisartan) therapies.  Patient is on rosuvastatin for his HLD diagnosis and ASCVD prevention.  T2DM well-controlled on currently prescribed regimen; last HgbA1c was 6.0% when checked on 11/07/2021.  Due to patient's cardiovascular history, it is important to note that he is on a PDE5i (tadalafil) for and erectile dysfunction diagnosis. Patient does not have an OSAH diagnosis.Functional capacity, as defined by DASI, is documented as being >/= 4 METS.  No changes were made to his medication regimen.  Patient to follow-up with outpatient cardiology in 1 year or sooner if needed.  Manuel R Maciah Schweigert. is scheduled for an elective C3-6 OSTEOPHYTE REMOVAL-ANTERIOR CERVICAL  DECOMPRESSION/DISCECTOMY FUSION 3 LEVELS on 04/04/2022 with Dr. Meade Maw, MD. Given patient's past medical history significant for  cardiovascular diagnoses, presurgical cardiac clearance was sought by the PAT team. Per cardiology, "this patient is optimized for surgery and may proceed with the planned procedural course with a LOW risk of significant perioperative cardiovascular complications". In review of his medication reconciliation, it is noted that patient is currently on prescribed daily antiplatelet therapy. He has been instructed on recommendations for holding his daily low-dose ASA for 3 days prior to his procedure with plans to restart as soon as postoperative bleeding risk felt to be minimized by his attending surgeon. The patient has been instructed that his last dose of his anticoagulant will be on 03/31/2022.  Patient denies previous perioperative complications with anesthesia in the past. In review of the available records, it is noted that patient underwent a general anesthetic course here at [facility] in 07/2016 without documented complications.      03/27/2022    3:09 PM 03/26/2022   11:27 AM 03/06/2022   11:05 AM  Vitals with BMI  Height '6\' 0"'$  '6\' 0"'$    Weight 223 lbs 6 oz 224 lbs 14 oz 220 lbs  BMI 30.29 95.28 41.32  Systolic 440 102 725  Diastolic 62 73 55  Pulse 87 69 82    Providers/Specialists:   NOTE: Primary physician provider listed below. Patient may have been seen by APP or partner within same practice.   PROVIDER ROLE / SPECIALTY LAST Dola Factor, MD Neurosurgery (Surgeon) 03/27/2022  [name], MD Primary Care Provider 03/06/2022  Serafina Royals, MD Cardiology 12/20/2021  Jennings Books, MD Neurology 02/21/2022   Allergies:  Patient has no known allergies.  Current Home Medications:   No current facility-administered medications for this encounter.    aspirin 81 MG tablet    cetirizine (ZYRTEC) 10 MG tablet   Cholecalciferol (VITAMIN D3) 2000 UNITS capsule   Continuous Blood Gluc Sensor (FREESTYLE LIBRE 2 SENSOR) MISC   doxycycline (VIBRA-TABS) 100 MG tablet   hydrochlorothiazide (HYDRODIURIL) 25 MG tablet   meclizine (ANTIVERT) 25 MG tablet   metFORMIN (GLUCOPHAGE) 1000 MG tablet   MULTIPLE VITAMIN PO   OZEMPIC, 0.25 OR 0.5 MG/DOSE, 2 MG/3ML SOPN   pantoprazole (PROTONIX) 40 MG tablet   pioglitazone (ACTOS) 15 MG tablet   rOPINIRole (REQUIP) 0.25 MG tablet   rosuvastatin (CRESTOR) 20 MG tablet   Saw Palmetto, Serenoa repens, (SAW PALMETTO BERRY) 160 MG CAPS   tadalafil (CIALIS) 20 MG tablet   tadalafil (CIALIS) 5 MG tablet   telmisartan (MICARDIS) 40 MG tablet   tiZANidine (ZANAFLEX) 4 MG tablet   traMADol (ULTRAM) 50 MG tablet   History:   Past Medical History:  Diagnosis Date   Anxiety    Aortic atherosclerosis (HCC)    Cervical osteophyte    Chronic lower back pain    Coronary artery disease    DDD (degenerative disc disease), lumbar    Depression    Diastolic dysfunction 36/64/4034   a.) TTE 05/31/2021: EF >55%, mild BAE, mild RVE, triv TR/PR, mild MR, G1DD.   Erectile dysfunction    a.) on PDE5i (tadalafil)   Fatty liver disease, nonalcoholic    GERD (gastroesophageal reflux disease)    Hemorrhoids 2017   History of kidney stones    Hyperlipidemia    Hypertension    Hypertensive left ventricular hypertrophy    Left inguinal hernia    Lumbar spondylolysis    Lung nodules    OSA (obstructive sleep apnea)    a.) does not require  nocturnal PAP therapy   Osteoarthritis    Parkinsonian features    RBBB (right bundle branch block)    Recurrent aspiration pneumonia (HCC)    RLS (restless legs syndrome)    a.) on ropinirole   T2DM (type 2 diabetes mellitus) ([place])    Tremor    Past Surgical History:  Procedure Laterality Date   CIRCUMCISION     COLONOSCOPY  2007   COLONOSCOPY WITH PROPOFOL N/A 08/22/2016   Procedure: COLONOSCOPY  WITH PROPOFOL;  Surgeon: Christene Lye, MD;  Location: [facility] END[facility]COPY;  Service: Endoscopy;  Laterality: N/A;   CYSTOSCOPY WITH STENT PLACEMENT Right 09/18/2015   Procedure: CYSTOSCOPY WITH STENT PLACEMENT;  Surgeon: Hollice Espy, MD;  Location: [facility] ORS;  Service: Urology;  Laterality: Right;   CYSTOSCOPY WITH URETEROSCOPY, STONE BASKETRY AND STENT PLACEMENT Right 10/03/2015   Procedure: CYSTOSCOPY WITH URETEROSCOPY, STONE BASKETRY AND STENT PLACEMENT;  Surgeon: Hollice Espy, MD;  Location: [facility] ORS;  Service: Urology;  Laterality: Right;   EXTRACORPOREAL SHOCK WAVE LITHOTRIPSY Right 09/15/2015   Procedure: EXTRACORPOREAL SHOCK WAVE LITHOTRIPSY (ESWL);  Surgeon: Nickie Retort, MD;  Location: [facility] ORS;  Service: Urology;  Laterality: Right;   HERNIA REPAIR     ROTATOR CUFF REPAIR Right 06/2012   Family History  Problem Relation Age of Onset   Hypertension Mother    Diabetes Father    CAD Father    Diabetes Sister    Heart disease Sister    CAD Sister    Social History   Tobacco Use   Smoking status: Former    Packs/day: 2.00    Years: 15.00    Total pack years: 30.00    Types: Cigarettes    Quit date: 06/25/1974    Years since quitting: 47.7   Smokeless tobacco: Never  Vaping Use   Vaping Use: Never used  Substance Use Topics   Alcohol use: Yes    Comment: occ wine   Drug use: No    Pertinent Clinical Results:  LABS: Labs reviewed: Acceptable for surgery.  Component Date Value Ref Range Status   WBC 03/26/2022 7.5  4.0 - 10.5 K/uL Final   RBC 03/26/2022 4.32  4.22 - 5.81 MIL/uL Final   Hemoglobin 03/26/2022 13.8  13.0 - 17.0 g/dL Final   HCT 03/26/2022 41.9  39.0 - 52.0 % Final   MCV 03/26/2022 97.0  80.0 - 100.0 fL Final   MCH 03/26/2022 31.9  26.0 - 34.0 pg Final   MCHC 03/26/2022 32.9  30.0 - 36.0 g/dL Final   RDW 03/26/2022 12.9  11.5 - 15.5 % Final   Platelets 03/26/2022 199  150 - 400 K/uL Final   nRBC 03/26/2022 0.0  0.0 - 0.2 % Final   Performed  at [facility], [place], [place] [address]   Sodium 03/26/2022 137  135 - 145 mmol/L Final   Potassium 03/26/2022 3.9  3.5 - 5.1 mmol/L Final   Chloride 03/26/2022 102  98 - 111 mmol/L Final   CO2 03/26/2022 29  22 - 32 mmol/L Final   Glucose, Bld 03/26/2022 95  70 - 99 mg/dL Final   Glucose reference range applies only to samples taken after fasting for at least 8 hours.   BUN 03/26/2022 23  8 - 23 mg/dL Final   Creatinine, Ser 03/26/2022 1.38 (H)  0.61 - 1.24 mg/dL Final   Calcium 03/26/2022 9.4  8.9 - 10.3 mg/dL Final   Total Protein 03/26/2022 7.4  6.5 - 8.1  g/dL Final   Albumin 03/26/2022 3.9  3.5 - 5.0 g/dL Final   AST 03/26/2022 28  15 - 41 U/L Final   ALT 03/26/2022 22  0 - 44 U/L Final   Alkaline Phosphatase 03/26/2022 91  38 - 126 U/L Final   Total Bilirubin 03/26/2022 0.8  0.3 - 1.2 mg/dL Final   GFR, Estimated 03/26/2022 53 (L)  >60 mL/min Final   Comment: (NOTE) Calculated using the CKD-EPI Creatinine Equation (2021)    Anion gap 03/26/2022 6  5 - 15 Final   Performed at [facility], [address] [place], [place] [address]   ABO/RH(D) 03/26/2022 O POS   Final   Antibody Screen 03/26/2022 NEG   Final   Sample Expiration 03/26/2022 04/09/2022,2359   Final   Extend sample reason 03/26/2022    Final                   Value:NO TRANSFUSIONS OR PREGNANCY IN THE PAST 3 MONTHS Performed at [facility], [address] [place], [place] [address]    MRSA, PCR 03/26/2022 NEGATIVE  NEGATIVE Final   Staphylococcus aureus 03/26/2022 NEGATIVE  NEGATIVE Final   Comment: (NOTE) The Xpert SA Assay (FDA approved for NASAL specimens in patients 63 years of age and older), is one component of a comprehensive surveillance program. It is not intended to diagnose infection nor to guide or monitor treatment. Performed at [facility], [address] [place], [place] [address]     ECG: Date: 03/26/2022 Time ECG obtained: 1215  PM Rate: 65 bpm Rhythm:  Sinus rhythm with first-degree AV block; RBBB Axis (leads I and aVF): Normal Intervals: PR 280 ms. QRS 150 ms. QTc 463 ms. ST segment and T wave changes: No evidence of acute ST segment elevation or depression Comparison: Previous tracing obtained on 10/29/2021 revealed junctional tachycardia at a rate of 105 bpm   IMAGING / PROCEDURES: CT CERVICAL SPINE WO CONTRAST performed on 02/14/2022 There are bulky anterior osteophytes at all levels, but most significantly C3-4, C4-5 and C5-6 with the greatest impression on the posterior cervical esophagus at C3-4 and C4-5 possibly explaining the patient's symptoms. There is osteophytic bridging at C2-3 and C6-7, attempted bridging at other levels. Additional posterior endplate spurring, for the most part is nonstenosing except at C6-7 where there is mild encroachment on the ventral cord surface. Multilevel degenerative foraminal stenosis. Degenerative discopathy greatest at C6-7. Emphysema. Bilaterally ossified stylohyoid ligaments. This may be seen with Eagle's syndrome.  CT ABDOMEN PELVIS W CONTRAST performed on 11/14/2021 Left-sided colonic diverticulosis without evidence of acute diverticulitis. Moderate volume of formed stool throughout the colon. Correlate for constipation. Probable hepatic steatosis with morphologic changes suggestive of cirrhosis. Hyperenhancing 14 mm focus in segment IVb along the gallbladder fossa, which may represent focal fatty sparing but is incompletely evaluated and a discrete hepatic lesion is not excluded. More definitive characterization by nonemergent hepatic protocol MRI with and without contrast is recommended. Nonobstructive 3 mm left lower pole renal stone. Small fat containing left inguinal hernia. Aortic atherosclerosis   MYOCARDIAL PERFUSION IMAGING STUDY (LEXISCAN) performed on 05/31/2021 Normal left ventricular systolic function with a hyper dynamic LVEF of 68% Baseline ECG  revealed normal sinus rhythm with a RBBB Normal myocardial thickening and wall motion Left ventricular cavity size normal SPECT images demonstrate homogenous tracer distribution throughout the myocardium No evidence of stress-induced myocardial ischemia or arrhythmia Normal myocardial perfusion without evidence of myocardial ischemia.  Indeterminate risk due to baseline  ECG changes.  TRANSTHORACIC ECHOCARDIOGRAM performed on 05/31/2021 Normal left ventricular systolic function with an EF of >55% Left ventricular diastolic Doppler parameters consistent with abnormal relaxation (G1DD). Mild biatrial enlargement Mild right ventricular enlargement Trivial tricuspid and pulmonary valve regurgitation Mild mitral valve regurgitation Normal transvalvular gradients; no valvular stenosis No pericardial effusion  Impression and Plan:  Manuel R Maryan Char. has been referred for pre-anesthesia review and clearance prior to him undergoing the planned anesthetic and procedural courses. Available labs, pertinent testing, and imaging results were personally reviewed by me. This patient has been appropriately cleared by cardiology with an overall LOW risk of significant perioperative cardiovascular complications.  Based on clinical review performed today (03/30/22), barring any significant acute changes in the patient's overall condition, it is anticipated that he will be able to proceed with the planned surgical intervention. Any acute changes in clinical condition may necessitate his procedure being postponed and/or cancelled. Patient will meet with anesthesia team (MD and/or CRNA) on the day of his procedure for preoperative evaluation/assessment. Questions regarding anesthetic course will be fielded at that time.   Pre-surgical instructions were reviewed with the patient during his PAT appointment and questions were fielded by PAT clinical staff. Patient was advised that if any questions or concerns arise  prior to his procedure then he should return a call to PAT and/or his surgeon's office to discuss.  Honor Loh, MSN, APRN, FNP-C, CEN [facility]  Peri-operative Services Nurse Practitioner Phone: [phone] Fax: [phone] 03/30/22 1:47 PM  NOTE: This note has been prepared using Dragon dictation software. Despite my best ability to proofread, there is always the potential that unintentional transcriptional errors may still occur from this process.

## 2022-04-04 ENCOUNTER — Other Ambulatory Visit: Payer: Self-pay

## 2022-04-04 ENCOUNTER — Encounter: Payer: Self-pay | Admitting: Neurosurgery

## 2022-04-04 ENCOUNTER — Inpatient Hospital Stay: Payer: HMO

## 2022-04-04 ENCOUNTER — Encounter
Admission: RE | Disposition: A | Discharge status: Home / Self Care | Payer: Self-pay | Source: Ambulatory Visit | Attending: Neurosurgery

## 2022-04-04 ENCOUNTER — Inpatient Hospital Stay: Payer: HMO | Admitting: Urgent Care

## 2022-04-04 ENCOUNTER — Inpatient Hospital Stay
Admission: RE | Admit: 2022-04-04 | Discharge: 2022-04-05 | DRG: 517 | Disposition: A | Discharge status: Home / Self Care | Payer: HMO | Source: Ambulatory Visit | Attending: Neurosurgery | Admitting: Neurosurgery

## 2022-04-04 DIAGNOSIS — Z7984 Long term (current) use of oral hypoglycemic drugs: Secondary | ICD-10-CM

## 2022-04-04 DIAGNOSIS — M5136 Other intervertebral disc degeneration, lumbar region: Secondary | ICD-10-CM | POA: Diagnosis present

## 2022-04-04 DIAGNOSIS — I700 Atherosclerosis of aorta: Secondary | ICD-10-CM | POA: Diagnosis present

## 2022-04-04 DIAGNOSIS — N529 Male erectile dysfunction, unspecified: Secondary | ICD-10-CM | POA: Diagnosis present

## 2022-04-04 DIAGNOSIS — G2581 Restless legs syndrome: Secondary | ICD-10-CM | POA: Diagnosis present

## 2022-04-04 DIAGNOSIS — Z79899 Other long term (current) drug therapy: Secondary | ICD-10-CM

## 2022-04-04 DIAGNOSIS — M549 Dorsalgia, unspecified: Secondary | ICD-10-CM | POA: Diagnosis present

## 2022-04-04 DIAGNOSIS — M2578 Osteophyte, vertebrae: Principal | ICD-10-CM | POA: Diagnosis present

## 2022-04-04 DIAGNOSIS — Z8701 Personal history of pneumonia (recurrent): Secondary | ICD-10-CM

## 2022-04-04 DIAGNOSIS — R1314 Dysphagia, pharyngoesophageal phase: Secondary | ICD-10-CM | POA: Diagnosis present

## 2022-04-04 DIAGNOSIS — E785 Hyperlipidemia, unspecified: Secondary | ICD-10-CM | POA: Diagnosis present

## 2022-04-04 DIAGNOSIS — M47812 Spondylosis without myelopathy or radiculopathy, cervical region: Secondary | ICD-10-CM | POA: Diagnosis present

## 2022-04-04 DIAGNOSIS — F419 Anxiety disorder, unspecified: Secondary | ICD-10-CM | POA: Diagnosis present

## 2022-04-04 DIAGNOSIS — F32A Depression, unspecified: Secondary | ICD-10-CM | POA: Diagnosis present

## 2022-04-04 DIAGNOSIS — M1990 Unspecified osteoarthritis, unspecified site: Secondary | ICD-10-CM | POA: Diagnosis present

## 2022-04-04 DIAGNOSIS — E1165 Type 2 diabetes mellitus with hyperglycemia: Secondary | ICD-10-CM | POA: Diagnosis present

## 2022-04-04 DIAGNOSIS — Z7985 Long-term (current) use of injectable non-insulin antidiabetic drugs: Secondary | ICD-10-CM | POA: Diagnosis not present

## 2022-04-04 DIAGNOSIS — I119 Hypertensive heart disease without heart failure: Secondary | ICD-10-CM | POA: Diagnosis present

## 2022-04-04 DIAGNOSIS — I2510 Atherosclerotic heart disease of native coronary artery without angina pectoris: Secondary | ICD-10-CM | POA: Diagnosis present

## 2022-04-04 DIAGNOSIS — R1319 Other dysphagia: Secondary | ICD-10-CM

## 2022-04-04 DIAGNOSIS — I4510 Unspecified right bundle-branch block: Secondary | ICD-10-CM | POA: Diagnosis present

## 2022-04-04 DIAGNOSIS — R918 Other nonspecific abnormal finding of lung field: Secondary | ICD-10-CM | POA: Diagnosis present

## 2022-04-04 DIAGNOSIS — J439 Emphysema, unspecified: Secondary | ICD-10-CM | POA: Diagnosis present

## 2022-04-04 DIAGNOSIS — Z87442 Personal history of urinary calculi: Secondary | ICD-10-CM

## 2022-04-04 DIAGNOSIS — Z981 Arthrodesis status: Secondary | ICD-10-CM | POA: Diagnosis not present

## 2022-04-04 DIAGNOSIS — Z833 Family history of diabetes mellitus: Secondary | ICD-10-CM

## 2022-04-04 DIAGNOSIS — Z8249 Family history of ischemic heart disease and other diseases of the circulatory system: Secondary | ICD-10-CM

## 2022-04-04 DIAGNOSIS — Z7982 Long term (current) use of aspirin: Secondary | ICD-10-CM

## 2022-04-04 DIAGNOSIS — K219 Gastro-esophageal reflux disease without esophagitis: Secondary | ICD-10-CM | POA: Diagnosis present

## 2022-04-04 DIAGNOSIS — M4800 Spinal stenosis, site unspecified: Secondary | ICD-10-CM | POA: Diagnosis present

## 2022-04-04 DIAGNOSIS — G8929 Other chronic pain: Secondary | ICD-10-CM | POA: Diagnosis present

## 2022-04-04 DIAGNOSIS — G250 Essential tremor: Secondary | ICD-10-CM | POA: Diagnosis present

## 2022-04-04 DIAGNOSIS — Z87891 Personal history of nicotine dependence: Secondary | ICD-10-CM

## 2022-04-04 DIAGNOSIS — Z01818 Encounter for other preprocedural examination: Secondary | ICD-10-CM

## 2022-04-04 HISTORY — DX: Obstructive sleep apnea (adult) (pediatric): G47.33

## 2022-04-04 HISTORY — DX: Restless legs syndrome: G25.81

## 2022-04-04 HISTORY — DX: Depression, unspecified: F32.A

## 2022-04-04 HISTORY — PX: ANTERIOR CERVICAL DECOMP/DISCECTOMY FUSION: SHX1161

## 2022-04-04 HISTORY — DX: Osteophyte, vertebrae: M25.78

## 2022-04-04 HISTORY — DX: Other chronic pain: G89.29

## 2022-04-04 HISTORY — DX: Low back pain, unspecified: M54.50

## 2022-04-04 HISTORY — DX: Unspecified osteoarthritis, unspecified site: M19.90

## 2022-04-04 HISTORY — DX: Atherosclerosis of aorta: I70.0

## 2022-04-04 HISTORY — DX: Unspecified right bundle-branch block: I45.10

## 2022-04-04 HISTORY — DX: Type 2 diabetes mellitus without complications: E11.9

## 2022-04-04 HISTORY — DX: Spondylolysis, lumbar region: M43.06

## 2022-04-04 HISTORY — DX: Male erectile dysfunction, unspecified: N52.9

## 2022-04-04 HISTORY — DX: Unilateral inguinal hernia, without obstruction or gangrene, not specified as recurrent: K40.90

## 2022-04-04 HISTORY — DX: Anxiety disorder, unspecified: F41.9

## 2022-04-04 LAB — GLUCOSE, CAPILLARY
Glucose-Capillary: 124 mg/dL — ABNORMAL HIGH (ref 70–99)
Glucose-Capillary: 120 mg/dL — ABNORMAL HIGH (ref 70–99)
Glucose-Capillary: 172 mg/dL — ABNORMAL HIGH (ref 70–99)
Glucose-Capillary: 213 mg/dL — ABNORMAL HIGH (ref 70–99)

## 2022-04-04 LAB — ABO/RH: ABO/RH(D): O POS

## 2022-04-04 SURGERY — ANTERIOR CERVICAL DECOMPRESSION/DISCECTOMY FUSION 3 LEVELS
Anesthesia: General | Site: Spine Cervical

## 2022-04-04 MED ORDER — SODIUM CHLORIDE 0.9% FLUSH
3.0000 mL | Freq: Two times a day (BID) | INTRAVENOUS | Status: DC
  Administered 2022-04-04: 3 mL via INTRAVENOUS

## 2022-04-04 MED ORDER — CELECOXIB 200 MG PO CAPS
200.0000 mg | ORAL_CAPSULE | Freq: Two times a day (BID) | ORAL | Status: DC
  Administered 2022-04-04: 200 mg via ORAL
  Filled 2022-04-04 (×3): qty 1

## 2022-04-04 MED ORDER — LORATADINE 10 MG PO TABS
10.0000 mg | ORAL_TABLET | Freq: Every day | ORAL | Status: DC
  Filled 2022-04-04: qty 1

## 2022-04-04 MED ORDER — ACETAMINOPHEN 325 MG PO TABS: 650.0000 mg | ORAL_TABLET | ORAL | Status: DC | PRN

## 2022-04-04 MED ORDER — DEXAMETHASONE SODIUM PHOSPHATE 10 MG/ML IJ SOLN
INTRAMUSCULAR | Status: AC
  Filled 2022-04-04: qty 1

## 2022-04-04 MED ORDER — ONDANSETRON HCL 4 MG PO TABS: 4.0000 mg | ORAL_TABLET | Freq: Four times a day (QID) | ORAL | Status: DC | PRN

## 2022-04-04 MED ORDER — ACETAMINOPHEN 10 MG/ML IV SOLN
INTRAVENOUS | Status: DC | PRN
  Administered 2022-04-04: 1000 mg via INTRAVENOUS

## 2022-04-04 MED ORDER — PROPOFOL 10 MG/ML IV BOLUS
INTRAVENOUS | Status: AC
  Filled 2022-04-04: qty 20

## 2022-04-04 MED ORDER — DEXAMETHASONE SODIUM PHOSPHATE 10 MG/ML IJ SOLN
INTRAMUSCULAR | Status: DC | PRN
  Administered 2022-04-04: 10 mg via INTRAVENOUS

## 2022-04-04 MED ORDER — SUCCINYLCHOLINE CHLORIDE 200 MG/10ML IV SOSY
PREFILLED_SYRINGE | INTRAVENOUS | Status: DC | PRN
  Administered 2022-04-04: 120 mg via INTRAVENOUS

## 2022-04-04 MED ORDER — KETAMINE HCL 50 MG/5ML IJ SOSY
PREFILLED_SYRINGE | INTRAMUSCULAR | Status: AC
  Filled 2022-04-04: qty 5

## 2022-04-04 MED ORDER — GLYCOPYRROLATE 0.2 MG/ML IJ SOLN
INTRAMUSCULAR | Status: AC
  Filled 2022-04-04: qty 1

## 2022-04-04 MED ORDER — MIDAZOLAM HCL 2 MG/2ML IJ SOLN
INTRAMUSCULAR | Status: DC | PRN
  Administered 2022-04-04: 2 mg via INTRAVENOUS

## 2022-04-04 MED ORDER — PHENOL 1.4 % MT LIQD: 1.0000 | OROMUCOSAL | Status: DC | PRN

## 2022-04-04 MED ORDER — OXYCODONE HCL 5 MG PO TABS
10.0000 mg | ORAL_TABLET | ORAL | Status: DC | PRN
  Administered 2022-04-05: 10 mg via ORAL
  Filled 2022-04-04: qty 2

## 2022-04-04 MED ORDER — SODIUM CHLORIDE 0.9 % IV SOLN: INTRAVENOUS | Status: DC

## 2022-04-04 MED ORDER — FENTANYL CITRATE (PF) 100 MCG/2ML IJ SOLN
INTRAMUSCULAR | Status: AC
  Filled 2022-04-04: qty 2

## 2022-04-04 MED ORDER — ORAL CARE MOUTH RINSE: 15.0000 mL | Freq: Once | OROMUCOSAL | Status: AC

## 2022-04-04 MED ORDER — PHENYLEPHRINE HCL-NACL 20-0.9 MG/250ML-% IV SOLN
INTRAVENOUS | Status: AC
  Filled 2022-04-04: qty 250

## 2022-04-04 MED ORDER — BUPIVACAINE-EPINEPHRINE (PF) 0.5% -1:200000 IJ SOLN
INTRAMUSCULAR | Status: AC
  Filled 2022-04-04: qty 30

## 2022-04-04 MED ORDER — PANTOPRAZOLE SODIUM 40 MG PO TBEC
40.0000 mg | DELAYED_RELEASE_TABLET | ORAL | Status: DC
  Filled 2022-04-04: qty 1

## 2022-04-04 MED ORDER — CEFAZOLIN SODIUM-DEXTROSE 2-4 GM/100ML-% IV SOLN
INTRAVENOUS | Status: AC
  Filled 2022-04-04: qty 100

## 2022-04-04 MED ORDER — METHOCARBAMOL 500 MG PO TABS
500.0000 mg | ORAL_TABLET | Freq: Four times a day (QID) | ORAL | Status: DC | PRN
  Administered 2022-04-04: 500 mg via ORAL
  Filled 2022-04-04: qty 1

## 2022-04-04 MED ORDER — CHLORHEXIDINE GLUCONATE 0.12 % MT SOLN
OROMUCOSAL | Status: AC
  Administered 2022-04-04: 15 mL via OROMUCOSAL
  Filled 2022-04-04: qty 15

## 2022-04-04 MED ORDER — BUPIVACAINE-EPINEPHRINE 0.5% -1:200000 IJ SOLN
INTRAMUSCULAR | Status: DC | PRN
  Administered 2022-04-04: 9 mL

## 2022-04-04 MED ORDER — LIDOCAINE HCL (CARDIAC) PF 100 MG/5ML IV SOSY
PREFILLED_SYRINGE | INTRAVENOUS | Status: DC | PRN
  Administered 2022-04-04: 100 mg via INTRAVENOUS

## 2022-04-04 MED ORDER — ACETAMINOPHEN 650 MG RE SUPP: 650.0000 mg | RECTAL | Status: DC | PRN

## 2022-04-04 MED ORDER — SURGIFLO WITH THROMBIN (HEMOSTATIC MATRIX KIT) OPTIME
TOPICAL | Status: DC | PRN
  Administered 2022-04-04: 1 via TOPICAL

## 2022-04-04 MED ORDER — PIOGLITAZONE HCL 15 MG PO TABS
15.0000 mg | ORAL_TABLET | ORAL | Status: DC
  Filled 2022-04-04: qty 1

## 2022-04-04 MED ORDER — 0.9 % SODIUM CHLORIDE (POUR BTL) OPTIME
TOPICAL | Status: DC | PRN
  Administered 2022-04-04: 500 mL

## 2022-04-04 MED ORDER — ONDANSETRON HCL 4 MG/2ML IJ SOLN: 4.0000 mg | Freq: Once | INTRAMUSCULAR | Status: DC | PRN

## 2022-04-04 MED ORDER — LIDOCAINE HCL (PF) 2 % IJ SOLN
INTRAMUSCULAR | Status: AC
  Filled 2022-04-04: qty 5

## 2022-04-04 MED ORDER — MENTHOL 3 MG MT LOZG: 1.0000 | LOZENGE | OROMUCOSAL | Status: DC | PRN

## 2022-04-04 MED ORDER — ROSUVASTATIN CALCIUM 10 MG PO TABS
20.0000 mg | ORAL_TABLET | Freq: Every day | ORAL | Status: DC
  Filled 2022-04-04: qty 2

## 2022-04-04 MED ORDER — PROPOFOL 10 MG/ML IV BOLUS
INTRAVENOUS | Status: DC | PRN
  Administered 2022-04-04: 120 mg via INTRAVENOUS

## 2022-04-04 MED ORDER — ONDANSETRON HCL 4 MG/2ML IJ SOLN
INTRAMUSCULAR | Status: AC
  Filled 2022-04-04: qty 2

## 2022-04-04 MED ORDER — SENNA 8.6 MG PO TABS
1.0000 | ORAL_TABLET | Freq: Two times a day (BID) | ORAL | Status: DC
  Administered 2022-04-04 – 2022-04-05 (×2): 8.6 mg via ORAL
  Filled 2022-04-04 (×2): qty 1

## 2022-04-04 MED ORDER — FENTANYL CITRATE (PF) 100 MCG/2ML IJ SOLN
INTRAMUSCULAR | Status: AC
  Administered 2022-04-04: 25 ug via INTRAVENOUS
  Filled 2022-04-04: qty 2

## 2022-04-04 MED ORDER — SODIUM CHLORIDE 0.9% FLUSH: 3.0000 mL | INTRAVENOUS | Status: DC | PRN

## 2022-04-04 MED ORDER — TADALAFIL 5 MG PO TABS
5.0000 mg | ORAL_TABLET | Freq: Every day | ORAL | Status: DC
  Filled 2022-04-04: qty 1

## 2022-04-04 MED ORDER — IRBESARTAN 150 MG PO TABS
150.0000 mg | ORAL_TABLET | Freq: Every day | ORAL | Status: DC
  Administered 2022-04-04 – 2022-04-05 (×2): 150 mg via ORAL
  Filled 2022-04-04 (×2): qty 1

## 2022-04-04 MED ORDER — FENTANYL CITRATE (PF) 100 MCG/2ML IJ SOLN
INTRAMUSCULAR | Status: DC | PRN
  Administered 2022-04-04: 100 ug via INTRAVENOUS
  Administered 2022-04-04: 50 ug via INTRAVENOUS

## 2022-04-04 MED ORDER — ACETAMINOPHEN 10 MG/ML IV SOLN: 1000.0000 mg | Freq: Once | INTRAVENOUS | Status: DC | PRN

## 2022-04-04 MED ORDER — PHENYLEPHRINE HCL-NACL 20-0.9 MG/250ML-% IV SOLN
INTRAVENOUS | Status: DC | PRN
  Administered 2022-04-04: 40 ug/min via INTRAVENOUS

## 2022-04-04 MED ORDER — OXYCODONE HCL 5 MG PO TABS
5.0000 mg | ORAL_TABLET | ORAL | Status: DC | PRN
  Administered 2022-04-04: 5 mg via ORAL
  Filled 2022-04-04: qty 1

## 2022-04-04 MED ORDER — FENTANYL CITRATE (PF) 100 MCG/2ML IJ SOLN
25.0000 ug | INTRAMUSCULAR | Status: DC | PRN
  Administered 2022-04-04: 25 ug via INTRAVENOUS

## 2022-04-04 MED ORDER — HYDROCHLOROTHIAZIDE 25 MG PO TABS
25.0000 mg | ORAL_TABLET | ORAL | Status: DC
  Administered 2022-04-05: 25 mg via ORAL
  Filled 2022-04-04: qty 1

## 2022-04-04 MED ORDER — SUCCINYLCHOLINE CHLORIDE 200 MG/10ML IV SOSY
PREFILLED_SYRINGE | INTRAVENOUS | Status: AC
  Filled 2022-04-04: qty 10

## 2022-04-04 MED ORDER — ROPINIROLE HCL 1 MG PO TABS
5.0000 mg | ORAL_TABLET | Freq: Every day | ORAL | Status: DC
  Administered 2022-04-04: 5 mg via ORAL
  Filled 2022-04-04: qty 5

## 2022-04-04 MED ORDER — SODIUM CHLORIDE 0.9 % IV SOLN: 250.0000 mL | INTRAVENOUS | Status: DC

## 2022-04-04 MED ORDER — HYDROMORPHONE HCL 1 MG/ML IJ SOLN: 0.5000 mg | INTRAMUSCULAR | Status: DC | PRN

## 2022-04-04 MED ORDER — ONDANSETRON HCL 4 MG/2ML IJ SOLN
INTRAMUSCULAR | Status: DC | PRN
  Administered 2022-04-04: 4 mg via INTRAVENOUS

## 2022-04-04 MED ORDER — ONDANSETRON HCL 4 MG/2ML IJ SOLN: 4.0000 mg | Freq: Four times a day (QID) | INTRAMUSCULAR | Status: DC | PRN

## 2022-04-04 MED ORDER — MIDAZOLAM HCL 2 MG/2ML IJ SOLN
INTRAMUSCULAR | Status: AC
  Filled 2022-04-04: qty 2

## 2022-04-04 MED ORDER — METFORMIN HCL 500 MG PO TABS
1000.0000 mg | ORAL_TABLET | Freq: Two times a day (BID) | ORAL | Status: DC
  Administered 2022-04-04 – 2022-04-05 (×2): 1000 mg via ORAL
  Filled 2022-04-04 (×2): qty 2

## 2022-04-04 MED ORDER — ENOXAPARIN SODIUM 30 MG/0.3ML IJ SOSY
30.0000 mg | PREFILLED_SYRINGE | INTRAMUSCULAR | Status: DC
  Administered 2022-04-05: 30 mg via SUBCUTANEOUS
  Filled 2022-04-04: qty 0.3

## 2022-04-04 MED ORDER — KETOROLAC TROMETHAMINE 15 MG/ML IJ SOLN
7.5000 mg | Freq: Four times a day (QID) | INTRAMUSCULAR | Status: DC
  Administered 2022-04-04 – 2022-04-05 (×3): 7.5 mg via INTRAVENOUS
  Filled 2022-04-04 (×3): qty 1

## 2022-04-04 MED ORDER — ACETAMINOPHEN 10 MG/ML IV SOLN
INTRAVENOUS | Status: AC
  Filled 2022-04-04: qty 100

## 2022-04-04 MED ORDER — CEFAZOLIN IN SODIUM CHLORIDE 2-0.9 GM/100ML-% IV SOLN
2.0000 g | Freq: Once | INTRAVENOUS | Status: AC
  Administered 2022-04-04: 2 g via INTRAVENOUS
  Filled 2022-04-04: qty 100

## 2022-04-04 MED ORDER — OXYCODONE HCL 5 MG PO TABS: 5.0000 mg | ORAL_TABLET | Freq: Once | ORAL | Status: DC | PRN

## 2022-04-04 MED ORDER — GLYCOPYRROLATE 0.2 MG/ML IJ SOLN
INTRAMUSCULAR | Status: DC | PRN
  Administered 2022-04-04: .1 mg via INTRAVENOUS

## 2022-04-04 MED ORDER — CHLORHEXIDINE GLUCONATE 0.12 % MT SOLN: 15.0000 mL | Freq: Once | OROMUCOSAL | Status: AC

## 2022-04-04 MED ORDER — KETAMINE HCL 10 MG/ML IJ SOLN
INTRAMUSCULAR | Status: DC | PRN
  Administered 2022-04-04: 30 mg via INTRAVENOUS

## 2022-04-04 MED ORDER — OXYCODONE HCL 5 MG/5ML PO SOLN: 5.0000 mg | Freq: Once | ORAL | Status: DC | PRN

## 2022-04-04 MED ORDER — METHOCARBAMOL 1000 MG/10ML IJ SOLN
500.0000 mg | Freq: Four times a day (QID) | INTRAVENOUS | Status: DC | PRN
  Filled 2022-04-04: qty 5

## 2022-04-04 SURGICAL SUPPLY — 47 items
AGENT HMST KT MTR STRL THRMB (HEMOSTASIS) ×1
BASIN KIT SINGLE STR (MISCELLANEOUS) ×1 IMPLANT
BULB RESERV EVAC DRAIN JP 100C (MISCELLANEOUS) IMPLANT
BUR NEURO DRILL SOFT 3.0X3.8M (BURR) ×1 IMPLANT
DERMABOND ADVANCED .7 DNX12 (GAUZE/BANDAGES/DRESSINGS) ×1 IMPLANT
DRAIN CHANNEL JP 10F RND 20C F (MISCELLANEOUS) IMPLANT
DRAPE C ARM PK CFD 31 SPINE (DRAPES) ×1 IMPLANT
DRAPE LAPAROTOMY 77X122 PED (DRAPES) ×1 IMPLANT
DRAPE MICROSCOPE SPINE 48X150 (DRAPES) ×1 IMPLANT
DRAPE SURG 17X11 SM STRL (DRAPES) ×1 IMPLANT
DRSG TEGADERM 2-3/8X2-3/4 SM (GAUZE/BANDAGES/DRESSINGS) IMPLANT
FEE INTRAOP CADWELL SUPPLY NCS (MISCELLANEOUS) IMPLANT
FEE INTRAOP MONITOR IMPULS NCS (MISCELLANEOUS) IMPLANT
GLOVE BIOGEL PI IND STRL 6.5 (GLOVE) ×1 IMPLANT
GLOVE BIOGEL PI IND STRL 8.5 (GLOVE) ×1 IMPLANT
GLOVE SURG SYN 6.5 ES PF (GLOVE) ×2 IMPLANT
GLOVE SURG SYN 6.5 PF PI (GLOVE) ×2 IMPLANT
GLOVE SURG SYN 8.5  E (GLOVE) ×3
GLOVE SURG SYN 8.5 E (GLOVE) ×3 IMPLANT
GLOVE SURG SYN 8.5 PF PI (GLOVE) ×3 IMPLANT
GOWN SRG LRG LVL 4 IMPRV REINF (GOWNS) ×2 IMPLANT
GOWN SRG XL LVL 3 NONREINFORCE (GOWNS) ×1 IMPLANT
GOWN STRL NON-REIN TWL XL LVL3 (GOWNS) ×1
GOWN STRL REIN LRG LVL4 (GOWNS) ×2
INTRAOP CADWELL SUPPLY FEE NCS (MISCELLANEOUS)
INTRAOP DISP SUPPLY FEE NCS (MISCELLANEOUS)
INTRAOP MONITOR FEE IMPULS NCS (MISCELLANEOUS)
INTRAOP MONITOR FEE IMPULSE (MISCELLANEOUS)
KIT TURNOVER KIT A (KITS) ×1 IMPLANT
MANIFOLD NEPTUNE II (INSTRUMENTS) ×1 IMPLANT
NS IRRIG 1000ML POUR BTL (IV SOLUTION) ×1 IMPLANT
PACK LAMINECTOMY NEURO (CUSTOM PROCEDURE TRAY) ×1 IMPLANT
PAD ARMBOARD 7.5X6 YLW CONV (MISCELLANEOUS) ×2 IMPLANT
PIN CASPAR 14 (PIN) ×1 IMPLANT
PIN CASPAR 14MM (PIN)
SPONGE GAUZE 2X2 8PLY STRL LF (GAUZE/BANDAGES/DRESSINGS) IMPLANT
SPONGE KITTNER 5P (MISCELLANEOUS) ×2 IMPLANT
STAPLER SKIN PROX 35W (STAPLE) IMPLANT
SURGIFLO W/THROMBIN 8M KIT (HEMOSTASIS) ×1 IMPLANT
SUT DVC VLOC 3-0 CL 6 P-12 (SUTURE) IMPLANT
SUT ETHILON 3-0 (SUTURE) IMPLANT
SUT V-LOC 90 ABS DVC 3-0 CL (SUTURE) ×1 IMPLANT
SUT VIC AB 3-0 SH 8-18 (SUTURE) ×1 IMPLANT
SYR 20ML LL LF (SYRINGE) ×1 IMPLANT
TAPE CLOTH 3X10 WHT NS LF (GAUZE/BANDAGES/DRESSINGS) ×3 IMPLANT
TRAP FLUID SMOKE EVACUATOR (MISCELLANEOUS) ×1 IMPLANT
TRAY FOLEY MTR SLVR 16FR STAT (SET/KITS/TRAYS/PACK) IMPLANT

## 2022-04-04 NOTE — Anesthesia Procedure Notes (Signed)
Procedure Name: Intubation Date/Time: 04/04/2022 9:44 AM  Performed by: Cammie Sickle, CRNAPre-anesthesia Checklist: Patient identified, Patient being monitored, Timeout performed, Emergency Drugs available and Suction available Patient Re-evaluated:Patient Re-evaluated prior to induction Oxygen Delivery Method: Circle system utilized Preoxygenation: Pre-oxygenation with 100% oxygen Induction Type: IV induction Ventilation: Oral airway inserted - appropriate to patient size, Two handed mask ventilation required and Mask ventilation with difficulty Laryngoscope Size: McGraph and 4 Grade View: Grade I Tube type: Oral Tube size: 7.5 mm Number of attempts: 1 Airway Equipment and Method: Stylet Placement Confirmation: ETT inserted through vocal cords under direct vision, positive ETCO2 and breath sounds checked- equal and bilateral Secured at: 22 cm Tube secured with: Tape Dental Injury: Teeth and Oropharynx as per pre-operative assessment

## 2022-04-04 NOTE — Interval H&P Note (Signed)
History and Physical Interval Note:  04/04/2022 9:02 AM  Manuel R Maryan Char.  has presented today for surgery, with the diagnosis of Cervical spondylosis without myelopathy M47.812 Cervical osteophyte M25.78 Esophageal dysphagia  R13.19.  The various methods of treatment have been discussed with the patient and family. After consideration of risks, benefits and other options for treatment, the patient has consented to  Procedure(s): C3-6 OSTEOPHYTE REMOVAL-ANTERIOR CERVICAL DECOMPRESSION/DISCECTOMY FUSION 3 LEVELS (N/A) as a surgical intervention.  The patient's history has been reviewed, patient examined, no change in status, stable for surgery.  I have reviewed the patient's chart and labs.  Questions were answered to the patient's satisfaction.    Heart sounds normal no MRG. Chest Clear to Auscultation Bilaterally.   [name]

## 2022-04-04 NOTE — Transfer of Care (Signed)
Immediate Anesthesia Transfer of Care Note  Patient: Manuel Stafford.  Procedure(s) Performed: C3-6 OSTEOPHYTE REMOVAL (Spine Cervical)  Patient Location: PACU  Anesthesia Type:General  Level of Consciousness: awake, alert  and oriented  Airway & Oxygen Therapy: Patient Spontanous Breathing and Patient connected to face mask oxygen  Post-op Assessment: Report given to RN, Post -op Vital signs reviewed and stable and Patient moving all extremities  Post vital signs: Reviewed and stable  Last Vitals:  Vitals Value Taken Time  BP 130/72 04/04/22 1145  Temp 36.7 C 04/04/22 1145  Pulse 81 04/04/22 1145  Resp    SpO2      Last Pain:  Vitals:   04/04/22 0838  TempSrc: Oral  PainSc: 0-No pain         Complications: No notable events documented.

## 2022-04-04 NOTE — Anesthesia Preprocedure Evaluation (Signed)
Anesthesia Evaluation  Patient identified by MRN, date of birth, ID band Patient awake    Reviewed: Allergy & Precautions, NPO status , Patient's Chart, lab work & pertinent test results  History of Anesthesia Complications Negative for: history of anesthetic complications  Airway Mallampati: III  TM Distance: >3 FB Neck ROM: Limited    Dental no notable dental hx. (+) Teeth Intact   Pulmonary neg sleep apnea, pneumonia, resolved, neg COPD, Patient abstained from smoking.Not current smoker, former smoker,  Patient has had aspiration pneumonia x 2 secondary to his current issue of bone spurs compressing esophagus. He has recovered completely from his most recent bout.   Pulmonary exam normal breath sounds clear to auscultation       Cardiovascular Exercise Tolerance: Good METShypertension, Pt. on medications (-) CAD and (-) Past MI (-) dysrhythmias  Rhythm:Regular Rate:Normal - Systolic murmurs    Neuro/Psych PSYCHIATRIC DISORDERS Anxiety Depression negative neurological ROS     GI/Hepatic GERD  Medicated,(+)     (-) substance abuse  ,   Patient has cervical spine bone spurs which impinge on esophagus, causing partial retention of food as confirmed on swallow study.   Endo/Other  diabetes Takes Ozempic, held for two weeks.  Renal/GU negative Renal ROS     Musculoskeletal   Abdominal   Peds  Hematology   Anesthesia Other Findings Past Medical History: No date: Anxiety No date: Aortic atherosclerosis (HCC) No date: Cervical osteophyte No date: Chronic lower back pain No date: Coronary artery disease No date: DDD (degenerative disc disease), lumbar No date: Depression 40/81/4481: Diastolic dysfunction     Comment:  a.) TTE 05/31/2021: EF >55%, mild BAE, mild RVE, triv               TR/PR, mild MR, G1DD. No date: Erectile dysfunction     Comment:  a.) on PDE5i (tadalafil) No date: Fatty liver disease,  nonalcoholic No date: GERD (gastroesophageal reflux disease) 2017: Hemorrhoids No date: History of kidney stones No date: Hyperlipidemia No date: Hypertension No date: Hypertensive left ventricular hypertrophy No date: Left inguinal hernia No date: Lumbar spondylolysis No date: Lung nodules No date: OSA (obstructive sleep apnea)     Comment:  a.) does not require nocturnal PAP therapy No date: Osteoarthritis No date: Parkinsonian features No date: RBBB (right bundle branch block) No date: Recurrent aspiration pneumonia (HCC) No date: RLS (restless legs syndrome)     Comment:  a.) on ropinirole No date: T2DM (type 2 diabetes mellitus) (HCC) No date: Tremor  Reproductive/Obstetrics                             Anesthesia Physical Anesthesia Plan  ASA: 2  Anesthesia Plan: General   Post-op Pain Management: Ofirmev IV (intra-op)* and Dilaudid IV   Induction: Intravenous  PONV Risk Score and Plan: 3 and Ondansetron and Dexamethasone  Airway Management Planned: Oral ETT  Additional Equipment: None  Intra-op Plan:   Post-operative Plan: Extubation in OR  Informed Consent: I have reviewed the patients History and Physical, chart, labs and discussed the procedure including the risks, benefits and alternatives for the proposed anesthesia with the patient or authorized representative who has indicated his/her understanding and acceptance.     Dental advisory given  Plan Discussed with: CRNA and Surgeon  Anesthesia Plan Comments: (Discussed risks of anesthesia with patient, including PONV, sore throat, lip/dental/eye damage, post operative cognitive dysfunction. Rare risks discussed as well, such as cardiorespiratory  and neurological sequelae, and allergic reactions. Discussed the role of CRNA in patient's perioperative care. Patient understands.)        Anesthesia Quick Evaluation

## 2022-04-04 NOTE — Op Note (Signed)
Indications: Manuel Stafford. is suffering from Cervical spondylosis without myelopathy M47.812, Cervical osteophyte M25.78, Esophageal dysphagia R13.19 . he failed conservative management, and elected to proceed with surgery.  Findings: large compressive osteophytes C3-4 and C4-5  Preoperative Diagnosis: Cervical spondylosis without myelopathy M47.812, Cervical osteophyte M25.78, Esophageal dysphagia R13.19  Postoperative Diagnosis: same   EBL: 100 ml IVF: see AR ml Drains: 1 placed Disposition: Extubated and Stable to PACU Complications: none  No foley catheter was placed.   Preoperative Note:   Risks of surgery discussed include: infection, bleeding, stroke, coma, death, paralysis, CSF leak, nerve/spinal cord injury, numbness, tingling, weakness, complex regional pain syndrome, recurrent stenosis and/or disc herniation, vascular injury, development of instability, neck/back pain, need for further surgery, persistent symptoms, development of deformity, and the risks of anesthesia. The patient understood these risks and agreed to proceed.  Operative Note:   Procedure:  1) Anterior cervical osteophyte removal C3-6   Procedure: After obtaining informed consent, the patient taken to the operating room, placed in supine position, general anesthesia induced.  The patient had a small shoulder roll placed behind their shoulders.  The patient received preop antibiotics and IV Decadron.  The patient had a neck incision outlined, was prepped and draped in usual sterile fashion. The incision was injected with local anesthetic.   An incision was opened, dissection taken down medial to the carotid artery and jugular vein, lateral to the trachea and esophagus.  The prevertebral fascia was identified, and a localizing x-ray demonstrated the correct level.    Very large osteophytes were noted at C3-4 and C4-5.  The handheld Cloward retractors were used to protect the esophagus.  The prevertebral  space was developed until the osteophytes were visualized fully.  While protecting the esophagus, the drill and rongeurs were used to remove the anterior osteophytes from C3 to C6 (inclusive) until the anterior portion of the cervical spine was flush and near the normal anatomic contour.  There was not residual bony compression of the esophagus noted.  The bone edges were waxed, then the area irrigated.  A drain was placed.       Wound was closed in 2 layers using interrupted inverted 3-0 Vicryl sutures.  The wound was dressed with dermabond, the head of bed at 30 degrees, taken to recovery room in stable condition.  No new postop neurological deficits were identified.  Sponge and pattie counts were correct at the end of the procedure.   I performed the entire procedure with the assistance of Geronimo Boot PA as an Pensions consultant. An assistant was required for this procedure due to the complexity.  The assistant provided assistance in tissue manipulation and suction, and was required for the successful and safe performance of the procedure. I performed the critical portions of the procedure.   Meade Maw MD

## 2022-04-04 NOTE — Anesthesia Postprocedure Evaluation (Signed)
Anesthesia Post Note  Patient: Manuel Stafford.  Procedure(s) Performed: C3-6 OSTEOPHYTE REMOVAL (Spine Cervical)  Patient location during evaluation: PACU Anesthesia Type: General Level of consciousness: awake and alert Pain management: pain level controlled Vital Signs Assessment: post-procedure vital signs reviewed and stable Respiratory status: spontaneous breathing, nonlabored ventilation, respiratory function stable and patient connected to nasal cannula oxygen Cardiovascular status: blood pressure returned to baseline and stable Postop Assessment: no apparent nausea or vomiting Anesthetic complications: no   No notable events documented.   Last Vitals:  Vitals:   04/04/22 1215 04/04/22 1230  BP: 130/65 132/71  Pulse: 78 78  Resp: 19 15  Temp:    SpO2: 93% 93%    Last Pain:  Vitals:   04/04/22 1242  TempSrc:   PainSc: 9                  Arita Miss

## 2022-04-05 ENCOUNTER — Encounter: Payer: Self-pay | Admitting: Neurosurgery

## 2022-04-05 MED ORDER — TRAMADOL HCL 50 MG PO TABS: ORAL_TABLET | ORAL | 0 refills | Status: DC

## 2022-04-05 MED ORDER — METHYLPREDNISOLONE 4 MG PO TBPK: ORAL_TABLET | ORAL | 0 refills | Status: DC

## 2022-04-05 NOTE — Progress Notes (Signed)
    Attending Progress Note  History: Rio R Ichael Pullara. is here for dysphagia and osteophyte removal.  POD1: Doing well. Drain 25  Physical Exam: Vitals:   04/05/22 0005 04/05/22 0332  BP: 121/62 106/68  Pulse: 70 75  Resp: 18 18  Temp: 98.1 F (36.7 C) 97.8 F (36.6 C)  SpO2: 97% 94%    AA Ox3 CNI  Strength:5/5 throughout BUE Swallowing pills  Data:  No results for input(s): "NA", "K", "CL", "CO2", "BUN", "CREATININE", "LABGLOM", "GLUCOSE", "CALCIUM" in the last 168 hours. No results for input(s): "AST", "ALT", "ALKPHOS" in the last 168 hours.  Invalid input(s): "TBILI"   No results for input(s): "WBC", "HGB", "HCT", "PLT" in the last 168 hours. No results for input(s): "APTT", "INR" in the last 168 hours.       Other tests/results: n/a  Assessment/Plan:  Manuel Stafford. Is doing well after osteophyte removal.  - mobilize - pain control - DVT prophylaxis   Meade Maw MD, [facility] Department of Neurosurgery

## 2022-04-05 NOTE — TOC Progression Note (Signed)
Transition of Care (TOC) - Progression Note    Patient Details  Name: Jamiel Goncalves. MRN: [id number] Date of Birth: 1945/12/11  Transition of Care [facility]) CM/SW [place], RN Phone Number: 04/05/2022, 8:48 AM  Clinical Narrative:       Transition of Care [facility]) Screening Note   Patient Details  Name: Arjen Deringer. Date of Birth: 13-Apr-1946   Transition of Care [facility]) CM/SW Contact:    Conception Oms, RN Phone Number: 04/05/2022, 8:48 AM    Transition of Care Department [facility]) has reviewed patient and no TOC needs have been identified at this time. We will continue to monitor patient advancement through interdisciplinary progression rounds. If new patient transition needs arise, please place a TOC consult.   Expected Discharge Plan: Home/Self Care Barriers to Discharge: Barriers Resolved  Expected Discharge Plan and Services Expected Discharge Plan: Home/Self Care       Living arrangements for the past 2 months: Single Family Home Expected Discharge Date: 04/05/22                                     Social Determinants of Health (SDOH) Interventions Housing Interventions: Patient Refused  Readmission Risk Interventions     No data to display

## 2022-04-05 NOTE — Progress Notes (Signed)
Discharge instructions reviewed w/ patient at time of discharge, all active LDA's removed and charted accordingly. Pt demonstrated and verbalized complete and thorough understanding of the information covered in the discharge paperwork.

## 2022-04-05 NOTE — Evaluation (Signed)
Occupational Therapy Evaluation Patient Details Name: Manuel Stafford. MRN: [id number] DOB: 06-Dec-1945 Today's Date: 04/05/2022   History of Present Illness pt is C3-6 OSTEOPHYTE REMOVAL 04/04/22   Clinical Impression   Chart reviewed, pt greeted in chair agreeable to OT evaluation. PTA pt is MOD I-I in ADL/IADL, no reported falls. Pt provided education re: completion of ADLs using optimal body mechanics for recovery. Pt lives alone, 2 STE. At this time, pt is performing ADL/IADL close to baseline. Pt had already dressed (pants, shirt, socks) prior to OT evaluation, all other ADLs performed with supervision-MOD I. Pt amb in hallway 200' with MOD I, no AD. No further OT needs identified at this time. OT will sign off at this time.      Recommendations for follow up therapy are one component of a multi-disciplinary discharge planning process, led by the attending physician.  Recommendations may be updated based on patient status, additional functional criteria and insurance authorization.   Follow Up Recommendations  No OT follow up    Assistance Recommended at Discharge Intermittent Supervision/Assistance  Patient can return home with the following Assistance with cooking/housework    Functional Status Assessment  Patient has had a recent decline in their functional status and demonstrates the ability to make significant improvements in function in a reasonable and predictable amount of time.  Equipment Recommendations  None recommended by OT    Recommendations for Other Services       Precautions / Restrictions Precautions Precautions: Fall;Cervical Precaution Comments: no brace needed      Mobility Bed Mobility               General bed mobility comments: Nt pt in recliner pre/post session, provided education on body mechanics    Transfers Overall transfer level: Modified independent                        Balance Overall balance assessment: Mild  deficits observed, not formally tested                                         ADL either performed or assessed with clinical judgement   ADL Overall ADL's : Needs assistance/impaired                                       General ADL Comments: LB dressing, toilet transfer, grooming standing at sink level with supervision-MOD I, amb 200' in hallway no AD with MOD I     Vision Patient Visual Report: No change from baseline       Perception     Praxis      Pertinent Vitals/Pain Pain Assessment Pain Assessment: No/denies pain     Hand Dominance     Extremity/Trunk Assessment Upper Extremity Assessment Upper Extremity Assessment: Overall WFL for tasks assessed   Lower Extremity Assessment Lower Extremity Assessment: Overall WFL for tasks assessed   Cervical / Trunk Assessment Cervical / Trunk Assessment: Neck Surgery   Communication Communication Communication: No difficulties   Cognition Arousal/Alertness: Awake/alert Behavior During Therapy: WFL for tasks assessed/performed Overall Cognitive Status: Within Functional Limits for tasks assessed  General Comments       Exercises     Shoulder Instructions      Home Living Family/patient expects to be discharged to:: Private residence Living Arrangements: Alone   Type of Home: House Home Access: Stairs to enter Technical brewer of Steps: 2   Home Layout: One level     Bathroom Shower/Tub: Astronomer Accessibility: Yes   Home Equipment: None          Prior Functioning/Environment Prior Level of Function : Independent/Modified Independent                        OT Problem List: Decreased activity tolerance;Decreased strength      OT Treatment/Interventions:      OT Goals(Current goals can be found in the care plan section) Acute Rehab OT Goals Patient Stated Goal: go home  asap OT Goal Formulation: With patient Time For Goal Achievement: 04/19/22 Potential to Achieve Goals: Good  OT Frequency:      Co-evaluation              AM-PAC OT "6 Clicks" Daily Activity     Outcome Measure Help from another person eating meals?: None Help from another person taking care of personal grooming?: None Help from another person toileting, which includes using toliet, bedpan, or urinal?: None Help from another person bathing (including washing, rinsing, drying)?: None Help from another person to put on and taking off regular upper body clothing?: None Help from another person to put on and taking off regular lower body clothing?: None 6 Click Score: 24   End of Session Nurse Communication: Mobility status  Activity Tolerance: Patient tolerated treatment well Patient left: in chair;with call bell/phone within reach;with nursing/sitter in room  OT Visit Diagnosis: Unsteadiness on feet (R26.81)                Time: 6222-9798 OT Time Calculation (min): 11 min Charges:  OT General Charges $OT Visit: 1 Visit OT Evaluation $OT Eval Low Complexity: 1 Low  Shanon Payor, OTD OTR/L  04/05/22, 8:42 AM

## 2022-04-05 NOTE — Plan of Care (Signed)
  Problem: Education: Goal: Ability to verbalize activity precautions or restrictions will improve Outcome: Completed/Met Goal: Knowledge of the prescribed therapeutic regimen will improve Outcome: Completed/Met Goal: Understanding of discharge needs will improve Outcome: Completed/Met   Problem: Activity: Goal: Ability to avoid complications of mobility impairment will improve Outcome: Completed/Met Goal: Ability to tolerate increased activity will improve Outcome: Completed/Met Goal: Will remain free from falls Outcome: Completed/Met   Problem: Bowel/Gastric: Goal: Gastrointestinal status for postoperative course will improve Outcome: Completed/Met   Problem: Clinical Measurements: Goal: Ability to maintain clinical measurements within normal limits will improve Outcome: Completed/Met Goal: Postoperative complications will be avoided or minimized Outcome: Completed/Met Goal: Diagnostic test results will improve Outcome: Completed/Met   Problem: Pain Management: Goal: Pain level will decrease Outcome: Completed/Met   Problem: Skin Integrity: Goal: Will show signs of wound healing Outcome: Completed/Met   Problem: Health Behavior/Discharge Planning: Goal: Identification of resources available to assist in meeting health care needs will improve Outcome: Completed/Met   Problem: Bladder/Genitourinary: Goal: Urinary functional status for postoperative course will improve Outcome: Completed/Met   Problem: Education: Goal: Knowledge of General Education information will improve Description: Including pain rating scale, medication(s)/side effects and non-pharmacologic comfort measures Outcome: Completed/Met   Problem: Health Behavior/Discharge Planning: Goal: Ability to manage health-related needs will improve Outcome: Completed/Met   Problem: Clinical Measurements: Goal: Ability to maintain clinical measurements within normal limits will improve Outcome:  Completed/Met Goal: Will remain free from infection Outcome: Completed/Met Goal: Diagnostic test results will improve Outcome: Completed/Met Goal: Respiratory complications will improve Outcome: Completed/Met Goal: Cardiovascular complication will be avoided Outcome: Completed/Met   Problem: Activity: Goal: Risk for activity intolerance will decrease Outcome: Completed/Met   Problem: Nutrition: Goal: Adequate nutrition will be maintained Outcome: Completed/Met   Problem: Coping: Goal: Level of anxiety will decrease Outcome: Completed/Met   Problem: Elimination: Goal: Will not experience complications related to bowel motility Outcome: Completed/Met Goal: Will not experience complications related to urinary retention Outcome: Completed/Met   Problem: Pain Managment: Goal: General experience of comfort will improve Outcome: Completed/Met   Problem: Safety: Goal: Ability to remain free from injury will improve Outcome: Completed/Met   Problem: Skin Integrity: Goal: Risk for impaired skin integrity will decrease Outcome: Completed/Met

## 2022-04-05 NOTE — Discharge Summary (Signed)
Physician Discharge Summary  Patient ID: Manuel Stafford. MRN: [id number] DOB/AGE: 1946-06-22 76 y.o.  Admit date: 04/04/2022 Discharge date: 04/05/2022  Admission Diagnoses:Principal Problem:   Esophageal dysphagia Active Problems:   Cervical spondylosis without myelopathy   Osteophyte of cervical spine  Discharge Diagnoses:  Principal Problem:   Esophageal dysphagia Active Problems:   Cervical spondylosis without myelopathy   Osteophyte of cervical spine   Discharged Condition: good  Hospital Course: Mr. Trudel presented with dysphagia due to large osteophytes of the spine.  He underwent removal which went well, then was stable for discharge on POD1.  Consults: None  Significant Diagnostic Studies: radiology: X-Ray: removal of osteophytes  Treatments: surgery: cervical osteophyte removal C3-6  Discharge Exam: Blood pressure 132/64, pulse 76, temperature 97.9 F (36.6 C), resp. rate 16, height 6' (1.829 m), weight 99.8 kg, SpO2 97 %. General appearance: alert and cooperative  Disposition: Discharge disposition: 01-Home or Self Care       Discharge Instructions     Discharge patient   Complete by: As directed    Discharge disposition: 01-Home or Self Care   Discharge patient date: 04/05/2022   Incentive spirometry RT   Complete by: As directed       Allergies as of 04/05/2022   No Known Allergies      Medication List     TAKE these medications    aspirin 81 MG tablet Take 81 mg by mouth daily.   cetirizine 10 MG tablet Commonly known as: ZYRTEC TAKE 1 TABLET BY MOUTH EVERY DAY What changed: when to take this   doxycycline 100 MG tablet Commonly known as: VIBRA-TABS Take 1 tablet (100 mg total) by mouth 2 (two) times daily. What changed:  when to take this reasons to take this   FreeStyle Libre 2 Sensor Misc USE AS DIRECTED. CONTINUOUS BLOOD SENSOR.   hydrochlorothiazide 25 MG tablet Commonly known as: HYDRODIURIL Take 1  tablet (25 mg total) by mouth daily. What changed: when to take this   meclizine 25 MG tablet Commonly known as: ANTIVERT Take 1 tablet (25 mg total) by mouth 3 (three) times daily as needed for dizziness.   metFORMIN 1000 MG tablet Commonly known as: GLUCOPHAGE TAKE 1 TABLET (1,000 MG TOTAL) BY MOUTH 2 (TWO) TIMES DAILY WITH A MEAL.   methylPREDNISolone 4 MG Tbpk tablet Commonly known as: MEDROL DOSEPAK Take as directed on box   MULTIPLE VITAMIN PO Take 1 tablet by mouth daily.   Ozempic (0.25 or 0.5 MG/DOSE) 2 MG/3ML Sopn Generic drug: Semaglutide(0.25 or 0.'5MG'$ /DOS) Please specify directions, refills and quantity What changed: See the new instructions.   pantoprazole 40 MG tablet Commonly known as: PROTONIX Take 40 mg by mouth every morning.   pioglitazone 15 MG tablet Commonly known as: ACTOS TAKE 1 TABLET BY MOUTH EVERY DAY What changed: when to take this   rOPINIRole 0.25 MG tablet Commonly known as: REQUIP Take 5 mg by mouth at bedtime. 5 tablets daily   rosuvastatin 20 MG tablet Commonly known as: CRESTOR TAKE 1 TABLET BY MOUTH EVERY DAY What changed: when to take this   Saw Palmetto Berry 160 MG Caps Take 2 tablets by mouth daily.   tadalafil 20 MG tablet Commonly known as: CIALIS Take 20 mg by mouth daily as needed for erectile dysfunction. What changed: Another medication with the same name was changed. Make sure you understand how and when to take each.   tadalafil 5 MG tablet Commonly known as: CIALIS Take 1  tablet (5 mg total) by mouth daily. What changed: when to take this   telmisartan 40 MG tablet Commonly known as: MICARDIS TAKE 1 TABLET BY MOUTH EVERY DAY What changed: when to take this   tiZANidine 4 MG tablet Commonly known as: ZANAFLEX TAKE 1 TABLET BY MOUTH EVERYDAY AT BEDTIME What changed: See the new instructions.   traMADol 50 MG tablet Commonly known as: ULTRAM TAKE 1 TABLET BY MOUTH 3 TIMES A DAY AS NEEDED FOR BACK OR LEG  PAIN What changed:  how much to take how to take this when to take this   Vitamin D3 50 MCG (2000 UT) capsule Take 2,000 Units by mouth daily.        Follow-up Information     Geronimo Boot, PA-C Follow up on 04/19/2022.   Specialty: Neurosurgery Why: 0830 Contact information: [address] [place] [address] [phone]                 Signed: Meade Maw 04/05/2022, 7:39 AM

## 2022-04-05 NOTE — Discharge Instructions (Addendum)
  Your surgeon has performed an operation on your cervical spine (neck) to relieve pressure on the esophagus. This involved making an incision in the front of your neck and removing the bone pressing on your esophagus.  The following are instructions to help in your recovery once you have been discharged from the hospital. Even if you feel well, it is important that you follow these activity guidelines. If you do not let your neck heal properly from the surgery, you can increase the chance of return of your symptoms and other complications.  OK to take NSAIDs after you finish your steriod taper.  Activity    No bending, lifting, or twisting ("BLT"). Avoid lifting objects heavier than 10 pounds (gallon milk jug).  Where possible, avoid household activities that involve lifting, bending, reaching, pushing, or pulling such as laundry, vacuuming, grocery shopping, and childcare. Try to arrange for help from friends and family for these activities while your back heals.  Increase physical activity slowly as tolerated.  Taking short walks is encouraged, but avoid strenuous exercise. Do not jog, run, bicycle, lift weights, or participate in any other exercises unless specifically allowed by your doctor.  Talk to your doctor before resuming sexual activity.  You should not drive until cleared by your doctor.  Until released by your doctor, you should not return to work or school.  You should rest at home and let your body heal.   You may shower two days after your surgery.  After showering, lightly dab your incision dry. Do not take a tub bath or go swimming until approved by your doctor at your follow-up appointment.    If you smoke, we strongly recommend that you quit.  Smoking has been proven to interfere with normal bone healing and will dramatically reduce the success rate of your surgery. Please contact [facility] (800-QUIT-NOW) and use the resources at www.[facility].com for assistance in  stopping smoking.  Surgical Incision   The steri-strips/glue should begin to peel away within about a week (it is fine if the steri-strips fall off before then).  Diet           You may return to your usual diet. However, you may experience discomfort when swallowing in the first month after your surgery. This is normal. You may find that softer foods are more comfortable for you to swallow. Be sure to stay hydrated.  When to Contact us  You may experience pain in your neck and/or pain between your shoulder blades. This is normal and should improve in the next few weeks with the help of pain medication, muscle relaxers, and rest. Some patients report that a warm compress on the back of the neck or between the shoulder blades helps.  However, should you experience any of the following, contact us immediately: New numbness or weakness Pain that is progressively getting worse, and is not relieved by your pain medication, muscle relaxers, rest, and warm compresses Bleeding, redness, swelling, pain, or drainage from surgical incision Chills or flu-like symptoms Fever greater than 101.0 F (38.3 C) Inability to eat, drink fluids, or take medications Problems with bowel or bladder functions Difficulty breathing or shortness of breath Warmth, tenderness, or swelling in your calf Contact Information During office hours (Monday-Friday 9 am to 5 pm), please call your physician at [phone] and ask for Berdine Addison After hours and weekends, please call [phone] and speak with the neurosurgeon on call For a life-threatening emergency, call 911

## 2022-04-05 NOTE — Progress Notes (Signed)
PT Cancellation Note  Patient Details Name: Manuel Stafford. MRN: [id number] DOB: 03-22-1946   Cancelled Treatment:    Reason Eval/Treat Not Completed: Other (comment). Pt being discharged. Per OT demonstrates ability to ambulate safely and doesn't present with acute PT needs at this time. Will dc in house. Please reach out if assistance required.   Deija Buhrman 04/05/2022, 8:52 AM Greggory Stallion, PT, DPT, GCS [phone]

## 2022-04-06 ENCOUNTER — Telehealth: Payer: Self-pay

## 2022-04-06 ENCOUNTER — Telehealth: Payer: Self-pay | Admitting: *Deleted

## 2022-04-06 NOTE — Patient Outreach (Signed)
  Care Coordination [facility] Note Transition Care Management Follow-up Telephone Call Date of discharge and from where: [facility] [phone] How have you been since you were released from the hospital? My throat is sore Any questions or concerns? No  Items Reviewed: Did the pt receive and understand the discharge instructions provided? Yes  Medications obtained and verified? Yes  Other? No  Any new allergies since your discharge? No  Dietary orders reviewed? No Do you have support at home? Yes   Home Care and Equipment/Supplies: Were home health services ordered? no If so, what is the name of the agency? N/a  Has the agency set up a time to come to the patient's home? not applicable Were any new equipment or medical supplies ordered?  No What is the name of the medical supply agency? N/a Were you able to get the supplies/equipment? not applicable Do you have any questions related to the use of the equipment or supplies? No  Functional Questionnaire: (I = Independent and D = Dependent) ADLs: I  Bathing/Dressing- I  Meal Prep- I  Eating- i  Maintaining continence- I  Transferring/Ambulation- I  Managing Meds- I  Follow up appointments reviewed:  PCP Hospital f/u appt confirmed? Yes  Scheduled to see [name] [phone] 8:00 . [facility] f/u appt confirmed? Yes  Geronimo Boot [phone]  0830 . Are transportation arrangements needed? No  If their condition worsens, is the pt aware to call PCP or go to the Emergency Dept.? Yes Was the patient provided with contact information for the PCP's office or ED? Yes Was to pt encouraged to call back with questions or concerns? Yes  SDOH assessments and interventions completed:   Yes  Care Coordination Interventions Activated:  Yes   Care Coordination Interventions:  No Care Coordination interventions needed at this time.   Encounter Outcome:  Pt. Visit Completed    [facility]  Management [phone]

## 2022-04-06 NOTE — Telephone Encounter (Signed)
I agree with everything you told him.

## 2022-04-06 NOTE — Telephone Encounter (Signed)
Surgery 04/05/22- C3-6 Osteophyte removal-patient calls office at this time- requesting a call back today from [place] in regards to questions he has about his surgery.

## 2022-04-06 NOTE — Telephone Encounter (Signed)
I spoke with Manuel Stafford. He inquired about driving. We discussed that he should not drive while taking narcotics   He mentioned that he has a little bit of swelling and asked if this is normal. We discussed this and discussed red flags that would warrant evaluation at the ER.  I encouraged him to call our office or send a mychart message with any questions or concerns.

## 2022-04-07 ENCOUNTER — Encounter: Payer: Self-pay | Admitting: Neurosurgery

## 2022-04-09 ENCOUNTER — Telehealth: Payer: Self-pay

## 2022-04-09 NOTE — Telephone Encounter (Signed)
I spoke with Manuel Stafford and relayed Marzetta Board and Dr Rhea Bleacher recommendations. We discussed red flags that would warrants evaluation at the ER such as chest pain, SOB at rest, severe SOB. He verbalized understanding.

## 2022-04-09 NOTE — Telephone Encounter (Signed)
Reviewed with Dr. Izora Ribas. He recommends that patient sleep sitting up. He should continue to improve with time. Would keep appointment with PCP on Wednesday.   Reviewed picture of incision. It looks good.   Review the red flags with him and let him know to go to ED if he has any of these.

## 2022-04-09 NOTE — Telephone Encounter (Signed)
Mr Bentsen called in this morning. He reports he cant lay down to sleep b/c it cuts off his breathing. The person staying with him told him it sounds like it does when he needs a CPAP - he doesn't breathe for about 5 seconds. States this is new.  He reports he is having trouble taking deep breaths and he is coughing and having a lot of drainage (it is clear). His voice does sound a little more "gargly" while we are talking compared to when we spoke on Friday, but he is not having trouble breathing or coughing while we are talking.  He denies any calf pain. He is scheduled to see his PCP on Wednesday 8am.

## 2022-04-09 NOTE — Telephone Encounter (Signed)
Incision looks good. See other message from today.

## 2022-04-10 NOTE — Progress Notes (Unsigned)
I,Sha'taria Tyson,acting as a Education administrator for [employer], PA-C.,have documented all relevant documentation on the behalf of [name], PA-C,as directed by  [name], PA-C while in the presence of [name], PA-C.   Established patient visit   Patient: Manuel Stafford.   DOB: 08-21-1945   76 y.o. Male  MRN: [id number] Visit Date: 04/11/2022  Today's healthcare provider: [name], PA-C   No chief complaint on file.  Subjective    HPI  ***  Medications: Outpatient Medications Prior to Visit  Medication Sig   aspirin 81 MG tablet Take 81 mg by mouth daily.    cetirizine (ZYRTEC) 10 MG tablet TAKE 1 TABLET BY MOUTH EVERY DAY (Patient taking differently: Take 10 mg by mouth every morning.)   Cholecalciferol (VITAMIN D3) 2000 UNITS capsule Take 2,000 Units by mouth daily.    Continuous Blood Gluc Sensor (FREESTYLE LIBRE 2 SENSOR) MISC USE AS DIRECTED. CONTINUOUS BLOOD SENSOR.   doxycycline (VIBRA-TABS) 100 MG tablet Take 1 tablet (100 mg total) by mouth 2 (two) times daily. (Patient taking differently: Take 100 mg by mouth 2 (two) times daily as needed (takes only if he has aspiration pneumonia).)   hydrochlorothiazide (HYDRODIURIL) 25 MG tablet Take 1 tablet (25 mg total) by mouth daily. (Patient taking differently: Take 25 mg by mouth every morning.)   meclizine (ANTIVERT) 25 MG tablet Take 1 tablet (25 mg total) by mouth 3 (three) times daily as needed for dizziness.   metFORMIN (GLUCOPHAGE) 1000 MG tablet TAKE 1 TABLET (1,000 MG TOTAL) BY MOUTH 2 (TWO) TIMES DAILY WITH A MEAL.   methylPREDNISolone (MEDROL DOSEPAK) 4 MG TBPK tablet Take as directed on box   MULTIPLE VITAMIN PO Take 1 tablet by mouth daily.   OZEMPIC, 0.25 OR 0.5 MG/DOSE, 2 MG/3ML SOPN Please specify directions, refills and quantity (Patient taking differently: Inject 0.5 mg as directed once a week. On Thursday's)   pantoprazole (PROTONIX) 40 MG tablet Take 40 mg by mouth every morning.    pioglitazone (ACTOS) 15 MG tablet TAKE 1 TABLET BY MOUTH EVERY DAY (Patient taking differently: Take 15 mg by mouth every morning.)   rOPINIRole (REQUIP) 0.25 MG tablet Take 5 mg by mouth at bedtime. 5 tablets daily   rosuvastatin (CRESTOR) 20 MG tablet TAKE 1 TABLET BY MOUTH EVERY DAY (Patient taking differently: Take 20 mg by mouth at bedtime.)   Saw Palmetto, Serenoa repens, (SAW PALMETTO BERRY) 160 MG CAPS Take 2 tablets by mouth daily.    tadalafil (CIALIS) 20 MG tablet Take 20 mg by mouth daily as needed for erectile dysfunction.   tadalafil (CIALIS) 5 MG tablet Take 1 tablet (5 mg total) by mouth daily. (Patient taking differently: Take 5 mg by mouth every morning.)   telmisartan (MICARDIS) 40 MG tablet TAKE 1 TABLET BY MOUTH EVERY DAY (Patient taking differently: Take 40 mg by mouth every morning.)   tiZANidine (ZANAFLEX) 4 MG tablet TAKE 1 TABLET BY MOUTH EVERYDAY AT BEDTIME (Patient taking differently: Take 4 mg by mouth at bedtime. TAKE 1 TABLET BY MOUTH EVERYDAY AT BEDTIME)   traMADol (ULTRAM) 50 MG tablet TAKE 1 TABLET BY MOUTH 3 TIMES A DAY AS NEEDED FOR BACK OR LEG PAIN   No facility-administered medications prior to visit.    Review of Systems  {Labs  Heme  Chem  Endocrine  Serology  Results Review (optional):23779}   Objective    There were no vitals taken for this visit. {Show previous vital signs (optional):23777}  Physical Exam  ***  No results found for any visits on 04/11/22.  Assessment & Plan     ***  No follow-ups on file.      {provider attestation***:1}   [name], PA-C  [facility] [phone] (phone) [phone] (fax)  [place]

## 2022-04-11 ENCOUNTER — Ambulatory Visit (INDEPENDENT_AMBULATORY_CARE_PROVIDER_SITE_OTHER): Payer: HMO | Admitting: Physician Assistant

## 2022-04-11 ENCOUNTER — Ambulatory Visit
Admission: RE | Admit: 2022-04-11 | Discharge: 2022-04-11 | Disposition: A | Discharge status: Home / Self Care | Payer: HMO | Source: Ambulatory Visit | Attending: Physician Assistant | Admitting: Physician Assistant

## 2022-04-11 ENCOUNTER — Ambulatory Visit
Admission: RE | Admit: 2022-04-11 | Discharge: 2022-04-11 | Disposition: A | Discharge status: Home / Self Care | Payer: HMO | Attending: Physician Assistant | Admitting: Physician Assistant

## 2022-04-11 ENCOUNTER — Encounter: Payer: Self-pay | Admitting: Physician Assistant

## 2022-04-11 VITALS — BP 131/68 | HR 85 | Ht 72.0 in | Wt 213.0 lb

## 2022-04-11 DIAGNOSIS — E1159 Type 2 diabetes mellitus with other circulatory complications: Secondary | ICD-10-CM | POA: Diagnosis not present

## 2022-04-11 DIAGNOSIS — R0602 Shortness of breath: Secondary | ICD-10-CM | POA: Diagnosis present

## 2022-04-11 DIAGNOSIS — N182 Chronic kidney disease, stage 2 (mild): Secondary | ICD-10-CM | POA: Diagnosis not present

## 2022-04-11 DIAGNOSIS — J0110 Acute frontal sinusitis, unspecified: Secondary | ICD-10-CM | POA: Diagnosis not present

## 2022-04-11 DIAGNOSIS — T148XXA Other injury of unspecified body region, initial encounter: Secondary | ICD-10-CM | POA: Diagnosis not present

## 2022-04-11 DIAGNOSIS — I152 Hypertension secondary to endocrine disorders: Secondary | ICD-10-CM

## 2022-04-11 DIAGNOSIS — E1122 Type 2 diabetes mellitus with diabetic chronic kidney disease: Secondary | ICD-10-CM

## 2022-04-11 LAB — POCT GLYCOSYLATED HEMOGLOBIN (HGB A1C): Hemoglobin A1C: 5.5 % (ref 4.0–5.6)

## 2022-04-11 MED ORDER — AZELASTINE HCL 0.1 % NA SOLN: 1.0000 | Freq: Two times a day (BID) | NASAL | 1 refills | Status: DC

## 2022-04-11 NOTE — Assessment & Plan Note (Signed)
Firm hematoma vs seroma along ant. Cervical incision. Incision is well approximated no associated erythema, purulence. Likely causing some pressure while lying flat-- recommended using bed to prop himself up while sleeping . Advised pt to monitor swelling-- if worsens see Dr Nelly Laurence office. Otherwise keep routine  Surgical postop appt.

## 2022-04-11 NOTE — Assessment & Plan Note (Signed)
Likely viral for now, encouraged fluids, continue antihistamine, rx azelastine If symptoms persist into next week advised f/u with office

## 2022-04-11 NOTE — Assessment & Plan Note (Addendum)
Initially elevated on repeat more controlled. Likely 2/2 to pain, post op. Continue current medications Managed with hctz 25 mg and telmisartan 40 mg  Reviewed last cmp, Cr at baseline of 1.38 F/u 6 mo

## 2022-04-11 NOTE — Assessment & Plan Note (Addendum)
May be 2/2 to sinusitis, lack of sleep, hematoma from incision  Lungs CTA Stat CXR ordered r/o pneumo d/t history of aspiration pneumo-- reviewed report lungs are clear Advised pt to monitor and f/u if persisting worsened Pt will sleep on an incline

## 2022-04-11 NOTE — Assessment & Plan Note (Addendum)
Managed with metformin 1000 mg bid, actos 15 mg, and ozempic 0.5 mg  Last a1c was 6.0% today 5.5% recommend d/c actos, pt agreeable On statin On acei/arb optho utd Foot exam completed today uacr ordered today F/u 4 mo since making changes today

## 2022-04-12 ENCOUNTER — Encounter: Payer: Self-pay | Admitting: *Deleted

## 2022-04-13 LAB — MICROALBUMIN / CREATININE URINE RATIO
Creatinine, Urine: 115.6 mg/dL
Microalb/Creat Ratio: 12 mg/g creat (ref 0–29)
Microalbumin, Urine: 14.4 ug/mL

## 2022-04-13 LAB — SPECIMEN STATUS REPORT

## 2022-04-17 ENCOUNTER — Encounter: Payer: Self-pay | Admitting: Physician Assistant

## 2022-04-17 ENCOUNTER — Telehealth: Payer: Self-pay

## 2022-04-17 ENCOUNTER — Encounter: Payer: HMO | Admitting: Orthopedic Surgery

## 2022-04-17 ENCOUNTER — Ambulatory Visit (INDEPENDENT_AMBULATORY_CARE_PROVIDER_SITE_OTHER): Payer: HMO | Admitting: Physician Assistant

## 2022-04-17 VITALS — BP 133/81 | HR 80 | Temp 98.2°F | Resp 14 | Wt 216.0 lb

## 2022-04-17 DIAGNOSIS — R0681 Apnea, not elsewhere classified: Secondary | ICD-10-CM | POA: Diagnosis not present

## 2022-04-17 DIAGNOSIS — R400 Somnolence: Secondary | ICD-10-CM | POA: Diagnosis not present

## 2022-04-17 NOTE — Progress Notes (Signed)
I,Roshena L Chambers,acting as a scribe for [employer], PA-C.,have documented all relevant documentation on the behalf of Manuel Kirschner, PA-C,as directed by  Manuel Kirschner, PA-C while in the presence of Manuel Kirschner, PA-C.   Established patient visit   Patient: Manuel Stafford.   DOB: 1945/09/23   76 y.o. Male  MRN: [id number] Visit Date: 04/17/2022  Today's healthcare provider: [name], PA-C   Chief Complaint  Patient presents with   Insomnia   Snoring   Subjective    Insomnia Primary symptoms: premature morning awakening.     Patient has trouble staying asleep. He states that he had a sleep study several years ago which showed sleep apnea. He was placed on a CPAP.  He was able to lose weight and stopped using his CPAP.    Pt has not used CPAP machine in years. He states he is fatigued throughout the day. He is not sleeping soundly. Girlfriend has witnessed apneic episodes, states they have lasted as long as 8 seconds.     04/17/2022    2:00 PM  Results of the Epworth flowsheet  Sitting and reading 3  Watching TV 3  Sitting, inactive in a public place (e.g. a theatre or a meeting) 1  As a passenger in a car for an hour without a break 1  Lying down to rest in the afternoon when circumstances permit 3  Sitting and talking to someone 1  Sitting quietly after a lunch without alcohol 2  In a car, while stopped for a few minutes in traffic 0  Total score 14     Outpatient Medications Prior to Visit  Medication Sig   aspirin 81 MG tablet Take 81 mg by mouth daily.    azelastine (ASTELIN) 0.1 % nasal spray Place 1 spray into both nostrils 2 (two) times daily. Use in each nostril as directed   Cholecalciferol (VITAMIN D3) 2000 UNITS capsule Take 2,000 Units by mouth daily.    Continuous Blood Gluc Sensor (FREESTYLE LIBRE 2 SENSOR) MISC USE AS DIRECTED. CONTINUOUS BLOOD SENSOR.   hydrochlorothiazide (HYDRODIURIL) 25 MG tablet Take 1 tablet (25 mg total)  by mouth daily. (Patient taking differently: Take 25 mg by mouth every morning.)   meclizine (ANTIVERT) 25 MG tablet Take 1 tablet (25 mg total) by mouth 3 (three) times daily as needed for dizziness.   metFORMIN (GLUCOPHAGE) 1000 MG tablet TAKE 1 TABLET (1,000 MG TOTAL) BY MOUTH 2 (TWO) TIMES DAILY WITH A MEAL.   MULTIPLE VITAMIN PO Take 1 tablet by mouth daily.   OZEMPIC, 0.25 OR 0.5 MG/DOSE, 2 MG/3ML SOPN Please specify directions, refills and quantity (Patient taking differently: Inject 0.5 mg as directed once a week. On Thursday's)   pantoprazole (PROTONIX) 40 MG tablet Take 40 mg by mouth every morning.   rOPINIRole (REQUIP) 0.25 MG tablet Take 5 mg by mouth at bedtime. 5 tablets daily   rosuvastatin (CRESTOR) 20 MG tablet TAKE 1 TABLET BY MOUTH EVERY DAY (Patient taking differently: Take 20 mg by mouth at bedtime.)   Saw Palmetto, Serenoa repens, (SAW PALMETTO BERRY) 160 MG CAPS Take 2 tablets by mouth daily.    tadalafil (CIALIS) 20 MG tablet Take 20 mg by mouth daily as needed for erectile dysfunction.   tadalafil (CIALIS) 5 MG tablet Take 1 tablet (5 mg total) by mouth daily. (Patient taking differently: Take 5 mg by mouth every morning.)   telmisartan (MICARDIS) 40 MG tablet TAKE 1 TABLET BY MOUTH  EVERY DAY (Patient taking differently: Take 40 mg by mouth every morning.)   tiZANidine (ZANAFLEX) 4 MG tablet TAKE 1 TABLET BY MOUTH EVERYDAY AT BEDTIME (Patient taking differently: Take 4 mg by mouth at bedtime. TAKE 1 TABLET BY MOUTH EVERYDAY AT BEDTIME)   traMADol (ULTRAM) 50 MG tablet TAKE 1 TABLET BY MOUTH 3 TIMES A DAY AS NEEDED FOR BACK OR LEG PAIN   [DISCONTINUED] cetirizine (ZYRTEC) 10 MG tablet TAKE 1 TABLET BY MOUTH EVERY DAY (Patient taking differently: Take 10 mg by mouth every morning.)   [DISCONTINUED] methylPREDNISolone (MEDROL DOSEPAK) 4 MG TBPK tablet Take as directed on box   No facility-administered medications prior to visit.    Review of Systems  Constitutional:   Negative for appetite change, chills and fever.  Respiratory:  Negative for chest tightness, shortness of breath and wheezing.   Cardiovascular:  Negative for chest pain and palpitations.  Gastrointestinal:  Negative for abdominal pain, nausea and vomiting.  Psychiatric/Behavioral:  The patient has insomnia.      Objective    Blood pressure 133/81, pulse 80, temperature 98.2 F (36.8 C), temperature source Oral, resp. rate 14, weight 216 lb (98 kg), SpO2 98 %.   Physical Exam Vitals reviewed.  Constitutional:      Appearance: He is not ill-appearing.  HENT:     Head: Normocephalic.  Eyes:     Conjunctiva/sclera: Conjunctivae normal.  Cardiovascular:     Rate and Rhythm: Normal rate.  Pulmonary:     Effort: Pulmonary effort is normal. No respiratory distress.  Neurological:     General: No focal deficit present.     Mental Status: He is alert and oriented to person, place, and time.  Psychiatric:        Mood and Affect: Mood normal.        Behavior: Behavior normal.      No results found for any visits on 04/17/22.  Assessment & Plan     Problem List Items Addressed This Visit       Other   Daytime sleepiness - Primary    Epworth score 14  Witnessed apenic episodes H/o sleep apnea dx with sleep study 10+ years ago pt is not compliant w/ cpap use for years Will order new sleep study w/ mask fitting if possible      Relevant Orders   PSG Sleep Study   Cpap titration   Other Visit Diagnoses     Apneic episode       Relevant Orders   PSG Sleep Study   Cpap titration        Return if symptoms worsen or fail to improve.      I, Manuel Kirschner, PA-C have reviewed all documentation for this visit. The documentation on  04/17/2022 for the exam, diagnosis, procedures, and orders are all accurate and complete.  Manuel Kirschner, PA-C [facility] [address] [place], [place], [address] Office: [phone] Fax: [place]

## 2022-04-17 NOTE — Assessment & Plan Note (Signed)
Epworth score 14  Witnessed apenic episodes H/o sleep apnea dx with sleep study 10+ years ago pt is not compliant w/ cpap use for years Will order new sleep study w/ mask fitting if possible

## 2022-04-17 NOTE — Progress Notes (Signed)
    Chronic Care Management Pharmacy Assistant   Name: Yuval Rubens.  MRN: [id number] DOB: 31-Oct-1945  Reason for Encounter: Medication Review/Patient assistance renewal for Ozempic.   Medications: Outpatient Encounter Medications as of 04/17/2022  Medication Sig   aspirin 81 MG tablet Take 81 mg by mouth daily.    azelastine (ASTELIN) 0.1 % nasal spray Place 1 spray into both nostrils 2 (two) times daily. Use in each nostril as directed   cetirizine (ZYRTEC) 10 MG tablet TAKE 1 TABLET BY MOUTH EVERY DAY (Patient taking differently: Take 10 mg by mouth every morning.)   Cholecalciferol (VITAMIN D3) 2000 UNITS capsule Take 2,000 Units by mouth daily.    Continuous Blood Gluc Sensor (FREESTYLE LIBRE 2 SENSOR) MISC USE AS DIRECTED. CONTINUOUS BLOOD SENSOR.   hydrochlorothiazide (HYDRODIURIL) 25 MG tablet Take 1 tablet (25 mg total) by mouth daily. (Patient taking differently: Take 25 mg by mouth every morning.)   meclizine (ANTIVERT) 25 MG tablet Take 1 tablet (25 mg total) by mouth 3 (three) times daily as needed for dizziness.   metFORMIN (GLUCOPHAGE) 1000 MG tablet TAKE 1 TABLET (1,000 MG TOTAL) BY MOUTH 2 (TWO) TIMES DAILY WITH A MEAL.   methylPREDNISolone (MEDROL DOSEPAK) 4 MG TBPK tablet Take as directed on box   MULTIPLE VITAMIN PO Take 1 tablet by mouth daily.   OZEMPIC, 0.25 OR 0.5 MG/DOSE, 2 MG/3ML SOPN Please specify directions, refills and quantity (Patient taking differently: Inject 0.5 mg as directed once a week. On Thursday's)   pantoprazole (PROTONIX) 40 MG tablet Take 40 mg by mouth every morning.   rOPINIRole (REQUIP) 0.25 MG tablet Take 5 mg by mouth at bedtime. 5 tablets daily   rosuvastatin (CRESTOR) 20 MG tablet TAKE 1 TABLET BY MOUTH EVERY DAY (Patient taking differently: Take 20 mg by mouth at bedtime.)   Saw Palmetto, Serenoa repens, (SAW PALMETTO BERRY) 160 MG CAPS Take 2 tablets by mouth daily.    tadalafil (CIALIS) 20 MG tablet Take 20 mg by mouth daily as  needed for erectile dysfunction.   tadalafil (CIALIS) 5 MG tablet Take 1 tablet (5 mg total) by mouth daily. (Patient taking differently: Take 5 mg by mouth every morning.)   telmisartan (MICARDIS) 40 MG tablet TAKE 1 TABLET BY MOUTH EVERY DAY (Patient taking differently: Take 40 mg by mouth every morning.)   tiZANidine (ZANAFLEX) 4 MG tablet TAKE 1 TABLET BY MOUTH EVERYDAY AT BEDTIME (Patient taking differently: Take 4 mg by mouth at bedtime. TAKE 1 TABLET BY MOUTH EVERYDAY AT BEDTIME)   traMADol (ULTRAM) 50 MG tablet TAKE 1 TABLET BY MOUTH 3 TIMES A DAY AS NEEDED FOR BACK OR LEG PAIN   No facility-administered encounter medications on file as of 04/17/2022.     Patient assistance renewal for Ozempic:  I received a task from Junius Argyle, CPP requesting that I start the renewal application for patient assistance on the medication Ozempic to continue assistance through year 2024.   Patient states he has started the renewal pap on his on, and would like if the clinical pharmacist to look over it. Patient states he has appointment at 1:00 pm today with his PCP, and will leave it at the front desk for the CPP.Notified the clinical pharmacist.    [place] Pharmacist Assistant [phone]

## 2022-04-18 ENCOUNTER — Ambulatory Visit: Payer: HMO | Admitting: Urology

## 2022-04-18 NOTE — Progress Notes (Unsigned)
   REFERRING PHYSICIAN:  [name], Md No address on file  DOS: 04/04/22 cervical osteophyte removal C3-6   HISTORY OF PRESENT ILLNESS: Manuel R Maryan Char. is 2 weeks status post cervical osteophyte removal C3-6. Given medrol dose pack and tramadol on discharge from the hospital.   Saw his PCP last week and he is getting scheduled for sleep study.   He is not taking tramadol. He is taking zanaflex prn (is on this for his lower back). He has no significant neck pain. He is still having some swallowing issues. He has discomfort when he coughs.    PHYSICAL EXAMINATION:  NEUROLOGICAL:  General: In no acute distress.   Awake, alert, oriented to person, place, and time.  Pupils equal round and reactive to light.  Facial tone is symmetric.    Strength: Side Biceps Triceps Deltoid Interossei Grip Wrist Ext. Wrist Flex.  R '5 5 5 5 5 5 5  '$ L '5 5 5 5 5 5 5   '$ Incision c/d/I, no erythema or signs of infection. He has mild area of swelling lateral aspect of incision. No tenderness.   Imaging:  Nothing new to review.   Assessment / Plan: Manuel R Hurbert Duran. is doing well s/p above surgery. Treatment options reviewed with patient and following plan made:   - We discussed activity escalation and I have advised the patient to lift up to 10 pounds until 6 weeks after surgery (until your follow up with Dr. Izora Ribas).   - Reviewed wound care.  - Continue current medications including prn zanaflex. - Discussed that swallowing should continue to improve.  - Follow up as scheduled in 4 weeks and prn.   Advised to contact the office if any questions or concerns arise.   Manuel Boot PA-C Dept of Neurosurgery

## 2022-04-19 ENCOUNTER — Encounter: Payer: Self-pay | Admitting: Orthopedic Surgery

## 2022-04-19 ENCOUNTER — Ambulatory Visit (INDEPENDENT_AMBULATORY_CARE_PROVIDER_SITE_OTHER): Payer: HMO | Admitting: Orthopedic Surgery

## 2022-04-19 VITALS — BP 130/72 | Temp 97.6°F | Ht 72.0 in | Wt 216.0 lb

## 2022-04-19 DIAGNOSIS — R1319 Other dysphagia: Secondary | ICD-10-CM

## 2022-04-19 DIAGNOSIS — Z09 Encounter for follow-up examination after completed treatment for conditions other than malignant neoplasm: Secondary | ICD-10-CM

## 2022-04-19 DIAGNOSIS — M47812 Spondylosis without myelopathy or radiculopathy, cervical region: Secondary | ICD-10-CM

## 2022-04-19 DIAGNOSIS — M2578 Osteophyte, vertebrae: Secondary | ICD-10-CM

## 2022-04-19 DIAGNOSIS — Z9889 Other specified postprocedural states: Secondary | ICD-10-CM

## 2022-04-27 NOTE — Telephone Encounter (Signed)
Hi Tommy Your referral was sent to [facility]. They are waling on approval from your insurance company. Hopefully you will get a call from them next week This is their contact information  Address: [address], [place], [place] [address] Phone: [phone]

## 2022-05-01 ENCOUNTER — Telehealth: Payer: Self-pay

## 2022-05-01 NOTE — Progress Notes (Signed)
I reach out to [employer] to check on the status of patient application renewal  for Ozempic.  Per [employer], Patient was re enroll with a end date of 06/25/2023.  Patient verbalized understanding.  [place] Pharmacist Assistant [phone]

## 2022-05-02 ENCOUNTER — Other Ambulatory Visit: Payer: Self-pay | Admitting: *Deleted

## 2022-05-02 DIAGNOSIS — G4733 Obstructive sleep apnea (adult) (pediatric): Secondary | ICD-10-CM

## 2022-05-02 NOTE — Telephone Encounter (Signed)
Hi Tommy  I have sent an order to [facility] for the home sleep study Phone: [phone]                                                                         Sarah (Referral Coordinator)

## 2022-05-04 ENCOUNTER — Other Ambulatory Visit: Payer: Self-pay | Admitting: Physician Assistant

## 2022-05-04 DIAGNOSIS — J0110 Acute frontal sinusitis, unspecified: Secondary | ICD-10-CM

## 2022-05-04 NOTE — Telephone Encounter (Signed)
Requested medication (s) are due for refill today:   Yes  Requested medication (s) are on the active medication list:   Yes  Future visit scheduled:   Yes   Last ordered: 04/11/2022 30 ml, 1 refill  Returned because a 90 day supply and a DX Code are being requested.   Requested Prescriptions  Pending Prescriptions Disp Refills   Azelastine HCl 137 MCG/SPRAY SOLN [Pharmacy Med Name: AZELASTINE 0.1% (137 MCG) SPRY]  1    Sig: PLACE 1 SPRAY INTO BOTH NOSTRILS 2 (TWO) TIMES DAILY. USE IN EACH NOSTRIL AS DIRECTED     Ear, Nose, and Throat: Nasal Preparations - Antiallergy Passed - 05/04/2022  8:30 AM      Passed - Valid encounter within last 12 months    Recent Outpatient Visits           2 weeks ago Daytime sleepiness   [facility] Thedore Mins, [address], PA-C   3 weeks ago Hypertension associated with diabetes [facility])   [facility] [name], PA-C   1 month ago Need for influenza vaccination   [facility] [name], MD   3 months ago Walking pneumonia   [facility] [name], MD   4 months ago Aspiration pneumonitis [facility])   [facility] [name], MD       Future Appointments             In 2 weeks Ralene Bathe, MD [place]   In 3 months Thedore Mins, Ria Comment, PA-C [facility], [place]   In 10 months Hollice Espy, MD [place]

## 2022-05-08 ENCOUNTER — Telehealth: Payer: Self-pay | Admitting: *Deleted

## 2022-05-08 NOTE — Telephone Encounter (Signed)
Copied from [place] [id number]. Topic: General - Other >> May 08, 2022 11:58 AM Sabas Sous wrote: Reason for CRM: Pt called and needs a sleep study, needs a call back from the clinic today he says. He does not want to go to [facility] because they are out of network.   Best contact: [phone]

## 2022-05-08 NOTE — Telephone Encounter (Unsigned)
Copied from [place] [id number]. Topic: General - Other >> May 08, 2022 11:58 AM Sabas Sous wrote: Reason for CRM: Pt called and needs a sleep study, needs a call back from the clinic today he says. He does not want to go to [facility] because they are out of network.   Best contact: [phone]

## 2022-05-08 NOTE — Telephone Encounter (Signed)
Please advise 

## 2022-05-09 ENCOUNTER — Other Ambulatory Visit: Payer: Self-pay | Admitting: Physician Assistant

## 2022-05-10 ENCOUNTER — Other Ambulatory Visit: Payer: Self-pay | Admitting: Family Medicine

## 2022-05-10 DIAGNOSIS — E1169 Type 2 diabetes mellitus with other specified complication: Secondary | ICD-10-CM

## 2022-05-10 NOTE — Telephone Encounter (Signed)
Requested Prescriptions  Pending Prescriptions Disp Refills   Continuous Blood Gluc Sensor (FREESTYLE LIBRE 2 SENSOR) MISC [Pharmacy Med Name: FREESTYLE LIBRE 2 SENSOR] 6 each 3    Sig: USE AS DIRECTED. CONTINUOUS BLOOD SENSOR.     Endocrinology: Diabetes - Testing Supplies Passed - 05/10/2022  1:59 PM      Passed - Valid encounter within last 12 months    Recent Outpatient Visits           3 weeks ago Daytime sleepiness   [facility] Thedore Mins, [place], PA-C   4 weeks ago Hypertension associated with diabetes [facility])   [facility] [name], PA-C   2 months ago Need for influenza vaccination   [facility] [name], MD   3 months ago Walking pneumonia   [facility] [name], MD   4 months ago Aspiration pneumonitis [facility])   [facility] [name], MD       Future Appointments             In 1 week Ralene Bathe, MD [place]   In 3 months Thedore Mins, Ria Comment, [place], [place]   In 9 months Hollice Espy, [place]

## 2022-05-11 ENCOUNTER — Encounter: Payer: Self-pay | Admitting: Physician Assistant

## 2022-05-15 ENCOUNTER — Encounter: Payer: Self-pay | Admitting: Neurosurgery

## 2022-05-15 ENCOUNTER — Ambulatory Visit (INDEPENDENT_AMBULATORY_CARE_PROVIDER_SITE_OTHER): Payer: HMO | Admitting: Neurosurgery

## 2022-05-15 VITALS — BP 134/70 | Ht 72.0 in | Wt 216.0 lb

## 2022-05-15 DIAGNOSIS — M47812 Spondylosis without myelopathy or radiculopathy, cervical region: Secondary | ICD-10-CM

## 2022-05-15 DIAGNOSIS — Z09 Encounter for follow-up examination after completed treatment for conditions other than malignant neoplasm: Secondary | ICD-10-CM

## 2022-05-15 DIAGNOSIS — M2578 Osteophyte, vertebrae: Secondary | ICD-10-CM

## 2022-05-15 DIAGNOSIS — R1319 Other dysphagia: Secondary | ICD-10-CM

## 2022-05-15 NOTE — Progress Notes (Signed)
   REFERRING PHYSICIAN:  [name], Md No address on file  DOS: 04/04/22 cervical osteophyte removal C3-6   HISTORY OF PRESENT ILLNESS: Manuel Stafford. is status post cervical osteophyte removal C3-6.  His swallowing has improved.  He is very happy with his improvements.  He has had some neck pain over the past few weeks.  PHYSICAL EXAMINATION:  NEUROLOGICAL:  General: In no acute distress.   Awake, alert, oriented to person, place, and time.  Pupils equal round and reactive to light.  Facial tone is symmetric.    Strength: Side Biceps Triceps Deltoid Interossei Grip Wrist Ext. Wrist Flex.  R '5 5 5 5 5 5 5  '$ L '5 5 5 5 5 5 5   '$ Incision c/d/I, no erythema or signs of infection. He has mild area of swelling lateral aspect of incision. No tenderness.   Imaging:  Nothing new to review.   Assessment / Plan: Manuel Stafford. is doing well s/p above surgery.  His voice and his swallowing have significantly improved since surgery.  I am very pleased with that.  We reviewed his activity limitations.  He is now able to lift 25 pounds and can start increasing his activity.  I will see him back in 6 weeks.     Meade Maw MD Dept of Neurosurgery

## 2022-05-23 ENCOUNTER — Ambulatory Visit: Payer: HMO | Admitting: Dermatology

## 2022-05-23 VITALS — BP 119/64 | HR 90

## 2022-05-23 DIAGNOSIS — L814 Other melanin hyperpigmentation: Secondary | ICD-10-CM

## 2022-05-23 DIAGNOSIS — Z1283 Encounter for screening for malignant neoplasm of skin: Secondary | ICD-10-CM | POA: Diagnosis not present

## 2022-05-23 DIAGNOSIS — L578 Other skin changes due to chronic exposure to nonionizing radiation: Secondary | ICD-10-CM

## 2022-05-23 DIAGNOSIS — L820 Inflamed seborrheic keratosis: Secondary | ICD-10-CM

## 2022-05-23 DIAGNOSIS — B353 Tinea pedis: Secondary | ICD-10-CM | POA: Diagnosis not present

## 2022-05-23 DIAGNOSIS — D229 Melanocytic nevi, unspecified: Secondary | ICD-10-CM

## 2022-05-23 DIAGNOSIS — Z79899 Other long term (current) drug therapy: Secondary | ICD-10-CM | POA: Diagnosis not present

## 2022-05-23 DIAGNOSIS — L821 Other seborrheic keratosis: Secondary | ICD-10-CM

## 2022-05-23 MED ORDER — KETOCONAZOLE 2 % EX CREA: 1.0000 | TOPICAL_CREAM | Freq: Every day | CUTANEOUS | 11 refills | Status: DC

## 2022-05-23 NOTE — Patient Instructions (Addendum)
Cryotherapy Aftercare  Wash gently with soap and water everyday.   Apply Vaseline and Band-Aid daily until healed.     Due to recent changes in healthcare laws, you may see results of your pathology and/or laboratory studies on MyChart before the doctors have had a chance to review them. We understand that in some cases there may be results that are confusing or concerning to you. Please understand that not all results are received at the same time and often the doctors may need to interpret multiple results in order to provide you with the best plan of care or course of treatment. Therefore, we ask that you please give us 2 business days to thoroughly review all your results before contacting the office for clarification. Should we see a critical lab result, you will be contacted sooner.   If You Need Anything After Your Visit  If you have any questions or concerns for your doctor, please call our main line at [phone] and press option 4 to reach your doctor's medical assistant. If no one answers, please leave a voicemail as directed and we will return your call as soon as possible. Messages left after 4 pm will be answered the following business day.   You may also send us a message via MyChart. We typically respond to MyChart messages within 1-2 business days.  For prescription refills, please ask your pharmacy to contact our office. Our fax number is [phone].  If you have an urgent issue when the clinic is closed that cannot wait until the next business day, you can page your doctor at the number below.    Please note that while we do our best to be available for urgent issues outside of office hours, we are not available 24/7.   If you have an urgent issue and are unable to reach us, you may choose to seek medical care at your doctor's office, retail clinic, urgent care center, or emergency room.  If you have a medical emergency, please immediately call 911 or go to the  emergency department.  Pager Numbers  - Dr. Kowalski: [phone]  - Dr. Moye: [phone]  - Dr. Stewart: [phone]  In the event of inclement weather, please call our main line at [phone] for an update on the status of any delays or closures.  Dermatology Medication Tips: Please keep the boxes that topical medications come in in order to help keep track of the instructions about where and how to use these. Pharmacies typically print the medication instructions only on the boxes and not directly on the medication tubes.   If your medication is too expensive, please contact our office at [phone] option 4 or send us a message through MyChart.   We are unable to tell what your co-pay for medications will be in advance as this is different depending on your insurance coverage. However, we may be able to find a substitute medication at lower cost or fill out paperwork to get insurance to cover a needed medication.   If a prior authorization is required to get your medication covered by your insurance company, please allow us 1-2 business days to complete this process.  Drug prices often vary depending on where the prescription is filled and some pharmacies may offer cheaper prices.  The website www.goodrx.com contains coupons for medications through different pharmacies. The prices here do not account for what the cost may be with help from insurance (it may be cheaper with your insurance), but the website can   give you the price if you did not use any insurance.  - You can print the associated coupon and take it with your prescription to the pharmacy.  - You may also stop by our office during regular business hours and pick up a GoodRx coupon card.  - If you need your prescription sent electronically to a different pharmacy, notify our office through [place] MyChart or by phone at [phone] option 4.     Si Usted Necesita Algo Despus de Su Visita  Tambin puede  enviarnos un mensaje a travs de MyChart. Por lo general respondemos a los mensajes de MyChart en el transcurso de 1 a 2 das hbiles.  Para renovar recetas, por favor pida a su farmacia que se ponga en contacto con nuestra oficina. Nuestro nmero de fax es el [phone].  Si tiene un asunto urgente cuando la clnica est cerrada y que no puede esperar hasta el siguiente da hbil, puede llamar/localizar a su doctor(a) al nmero que aparece a continuacin.   Por favor, tenga en cuenta que aunque hacemos todo lo posible para estar disponibles para asuntos urgentes fuera del horario de oficina, no estamos disponibles las 24 horas del da, los 7 das de la semana.   Si tiene un problema urgente y no puede comunicarse con nosotros, puede optar por buscar atencin mdica  en el consultorio de su doctor(a), en una clnica privada, en un centro de atencin urgente o en una sala de emergencias.  Si tiene una emergencia mdica, por favor llame inmediatamente al 911 o vaya a la sala de emergencias.  Nmeros de bper  - Dr. Kowalski: [phone]  - Dra. Moye: [phone]  - Dra. Stewart: [phone]  En caso de inclemencias del tiempo, por favor llame a nuestra lnea principal al [phone] para una actualizacin sobre el estado de cualquier retraso o cierre.  Consejos para la medicacin en dermatologa: Por favor, guarde las cajas en las que vienen los medicamentos de uso tpico para ayudarle a seguir las instrucciones sobre dnde y cmo usarlos. Las farmacias generalmente imprimen las instrucciones del medicamento slo en las cajas y no directamente en los tubos del medicamento.   Si su medicamento es muy caro, por favor, pngase en contacto con nuestra oficina llamando al [phone] y presione la opcin 4 o envenos un mensaje a travs de MyChart.   No podemos decirle cul ser su copago por los medicamentos por adelantado ya que esto es diferente dependiendo de la cobertura de su seguro.  Sin embargo, es posible que podamos encontrar un medicamento sustituto a menor costo o llenar un formulario para que el seguro cubra el medicamento que se considera necesario.   Si se requiere una autorizacin previa para que su compaa de seguros cubra su medicamento, por favor permtanos de 1 a 2 das hbiles para completar este proceso.  Los precios de los medicamentos varan con frecuencia dependiendo del lugar de dnde se surte la receta y alguna farmacias pueden ofrecer precios ms baratos.  El sitio web www.goodrx.com tiene cupones para medicamentos de diferentes farmacias. Los precios aqu no tienen en cuenta lo que podra costar con la ayuda del seguro (puede ser ms barato con su seguro), pero el sitio web puede darle el precio si no utiliz ningn seguro.  - Puede imprimir el cupn correspondiente y llevarlo con su receta a la farmacia.  - Tambin puede pasar por nuestra oficina durante el horario de atencin regular y recoger una tarjeta de cupones de GoodRx.  -   Si necesita que su receta se enve electrnicamente a una farmacia diferente, informe a nuestra oficina a travs de MyChart de [place] o por telfono llamando al [phone] y presione la opcin 4.  

## 2022-05-23 NOTE — Progress Notes (Signed)
Follow-Up Visit   Subjective  Manuel Stafford. is a 76 y.o. male who presents for the following: Other and Annual Exam (No history of skin cancer or abnormal moles - The patient presents for Total-Body Skin Exam (TBSE) for skin cancer screening and mole check.  The patient has spots, moles and lesions to be evaluated, some may be new or changing and the patient has concerns that these could be cancer./).  The following portions of the chart were reviewed this encounter and updated as appropriate:   Tobacco  Allergies  Meds  Problems  Med Hx  Surg Hx  Fam Hx     Review of Systems:  No other skin or systemic complaints except as noted in HPI or Assessment and Plan.  Objective  Well appearing patient in no apparent distress; mood and affect are within normal limits.  A full examination was performed including scalp, head, eyes, ears, nose, lips, neck, chest, axillae, abdomen, back, buttocks, bilateral upper extremities, bilateral lower extremities, hands, feet, fingers, toes, fingernails, and toenails. All findings within normal limits unless otherwise noted below.  Back x 1, left medial ankle x 1 (2) Erythematous stuck-on, waxy papule or plaque  Bilateral feet Scale   Assessment & Plan   Lentigines - Scattered tan macules - Due to sun exposure - Benign-appearing, observe - Recommend daily broad spectrum sunscreen SPF 30+ to sun-exposed areas, reapply every 2 hours as needed. - Call for any changes  Seborrheic Keratoses - Stuck-on, waxy, tan-brown papules and/or plaques  - Benign-appearing - Discussed benign etiology and prognosis. - Observe - Call for any changes  Melanocytic Nevi - Tan-brown and/or pink-flesh-colored symmetric macules and papules - Benign appearing on exam today - Observation - Call clinic for new or changing moles - Recommend daily use of broad spectrum spf 30+ sunscreen to sun-exposed areas.   Hemangiomas - Red papules - Discussed  benign nature - Observe - Call for any changes  Actinic Damage - Chronic condition, secondary to cumulative UV/sun exposure - diffuse scaly erythematous macules with underlying dyspigmentation - Recommend daily broad spectrum sunscreen SPF 30+ to sun-exposed areas, reapply every 2 hours as needed.  - Staying in the shade or wearing long sleeves, sun glasses (UVA+UVB protection) and wide brim hats (4-inch brim around the entire circumference of the hat) are also recommended for sun protection.  - Call for new or changing lesions.  Skin cancer screening performed today.  Inflamed seborrheic keratosis (2) Back x 1, left medial ankle x 1  Destruction of lesion - Back x 1, left medial ankle x 1 Complexity: simple   Destruction method: cryotherapy   Informed consent: discussed and consent obtained   Timeout:  patient name, date of birth, surgical site, and procedure verified Lesion destroyed using liquid nitrogen: Yes   Region frozen until ice ball extended beyond lesion: Yes   Outcome: patient tolerated procedure well with no complications   Post-procedure details: wound care instructions given    Tinea pedis of both feet Bilateral feet Chronic and persistent condition with duration or expected duration over one year. Condition is symptomatic / bothersome to patient. Not to goal. ketoconazole (NIZORAL) 2 % cream - Bilateral feet Apply 1 Application topically at bedtime.  Return in about 1 year (around 05/24/2023) for TBSE.  I, Ashok Cordia, CMA, am acting as scribe for Manuel Ser, MD . Documentation: I have reviewed the above documentation for accuracy and completeness, and I agree with the above.  Manuel Ser, MD

## 2022-05-28 ENCOUNTER — Ambulatory Visit
Admission: RE | Admit: 2022-05-28 | Discharge: 2022-05-28 | Disposition: A | Discharge status: Home / Self Care | Payer: HMO | Attending: Physician Assistant | Admitting: Physician Assistant

## 2022-05-28 ENCOUNTER — Encounter: Payer: Self-pay | Admitting: Physician Assistant

## 2022-05-28 ENCOUNTER — Ambulatory Visit
Admission: RE | Admit: 2022-05-28 | Discharge: 2022-05-28 | Disposition: A | Discharge status: Home / Self Care | Payer: HMO | Source: Ambulatory Visit | Attending: Physician Assistant | Admitting: Physician Assistant

## 2022-05-28 ENCOUNTER — Ambulatory Visit (INDEPENDENT_AMBULATORY_CARE_PROVIDER_SITE_OTHER): Payer: PPO | Admitting: Physician Assistant

## 2022-05-28 VITALS — BP 118/56 | HR 90 | Temp 98.0°F | Wt 228.0 lb

## 2022-05-28 DIAGNOSIS — R29898 Other symptoms and signs involving the musculoskeletal system: Secondary | ICD-10-CM | POA: Diagnosis present

## 2022-05-28 DIAGNOSIS — I959 Hypotension, unspecified: Secondary | ICD-10-CM

## 2022-05-28 NOTE — Assessment & Plan Note (Addendum)
Spinal vs vascular etiology Strength testing b/l lower extremities 5/5 but pt reports feels very fatigued after Given h/o of moderate- severe lumbar spinal stenosis, this is likely, will start with plain films and likely refer back to Dr Cari Caraway. H/o of mild atherosclerosis, will ref to vascular for r/o PAD

## 2022-05-28 NOTE — Assessment & Plan Note (Addendum)
Gradually progressing hypotension Encouraged increasing fluids Dec hctz dose to 12.5 mg ( half tablet)  Check bp at home, if >130/90 can restart hctz full dose

## 2022-05-28 NOTE — Progress Notes (Signed)
I,Connie R Striblin,acting as a Education administrator for [employer], PA-C.,have documented all relevant documentation on the behalf of [name], PA-C,as directed by  [name], PA-C while in the presence of [name], PA-C.   Established patient visit   Patient: Manuel Stafford.   DOB: [birth year]   76 y.o. Male  MRN: [id number] Visit Date: 05/28/2022  Today's healthcare provider: [name], PA-C   Chief Complaint  Patient presents with   Extremity Weakness   Subjective    HPI  Weakness in Lower Extremities   He reports chronic leg weakness. There was not an injury that may have caused the weakness.  The weakness  started about a month ago and is staying constant. The weakness does not radiate . The weakness is 8/10 in intensity, occurring constantly. Symptoms are worse in the: afternoon  Aggravating factors: walking Relieving factors: resting for longer periods of time  .  Pt reports has been ongoing but acutely worsening over the last month. Denies any numbness, pain, paresthesias. Reports cold feet at night.   He reports sensation is improved by standing w/ weight shifted or leaning forward.  H/o of spinal stenosis w/ injections denies lumbar spine surgery.  Medications: Outpatient Medications Prior to Visit  Medication Sig   aspirin 81 MG tablet Take 81 mg by mouth daily.    Azelastine HCl 137 MCG/SPRAY SOLN PLACE 1 SPRAY INTO BOTH NOSTRILS 2 (TWO) TIMES DAILY. USE IN EACH NOSTRIL AS DIRECTED   Cholecalciferol (VITAMIN D3) 2000 UNITS capsule Take 2,000 Units by mouth daily.    Continuous Blood Gluc Sensor (FREESTYLE LIBRE 2 SENSOR) MISC USE AS DIRECTED. CONTINUOUS BLOOD SENSOR.   hydrochlorothiazide (HYDRODIURIL) 25 MG tablet Take 1 tablet (25 mg total) by mouth daily. (Patient taking differently: Take 25 mg by mouth every morning.)   ketoconazole (NIZORAL) 2 % cream Apply 1 Application topically at bedtime.   meclizine (ANTIVERT) 25 MG tablet Take 1  tablet (25 mg total) by mouth 3 (three) times daily as needed for dizziness.   metFORMIN (GLUCOPHAGE) 1000 MG tablet TAKE 1 TABLET (1,000 MG TOTAL) BY MOUTH 2 (TWO) TIMES DAILY WITH A MEAL.   MULTIPLE VITAMIN PO Take 1 tablet by mouth daily.   OZEMPIC, 0.25 OR 0.5 MG/DOSE, 2 MG/3ML SOPN Please specify directions, refills and quantity (Patient taking differently: Inject 0.5 mg as directed once a week. On Thursday's)   pantoprazole (PROTONIX) 40 MG tablet TAKE 1 TABLET BY MOUTH EVERY DAY   rOPINIRole (REQUIP) 0.25 MG tablet Take 5 mg by mouth at bedtime. 5 tablets daily   rosuvastatin (CRESTOR) 20 MG tablet TAKE 1 TABLET BY MOUTH EVERY DAY (Patient taking differently: Take 20 mg by mouth at bedtime.)   Saw Palmetto, Serenoa repens, (SAW PALMETTO BERRY) 160 MG CAPS Take 2 tablets by mouth daily.    tadalafil (CIALIS) 5 MG tablet Take 1 tablet (5 mg total) by mouth daily. (Patient taking differently: Take 5 mg by mouth every morning.)   telmisartan (MICARDIS) 40 MG tablet TAKE 1 TABLET BY MOUTH EVERY DAY (Patient taking differently: Take 40 mg by mouth every morning.)   tiZANidine (ZANAFLEX) 4 MG tablet TAKE 1 TABLET BY MOUTH EVERYDAY AT BEDTIME (Patient taking differently: Take 4 mg by mouth at bedtime. TAKE 1 TABLET BY MOUTH EVERYDAY AT BEDTIME)   No facility-administered medications prior to visit.    Review of Systems  Constitutional:  Negative for fatigue and fever.  Respiratory:  Negative for cough and shortness  of breath.   Cardiovascular:  Negative for chest pain, palpitations and leg swelling.  Neurological:  Positive for weakness. Negative for dizziness and headaches.      Objective    Blood pressure (!) 118/56, pulse 90, temperature 98 F (36.7 C), temperature source Oral, weight 228 lb (103.4 kg), SpO2 98 %.   Physical Exam Vitals reviewed.  Constitutional:      Appearance: He is not ill-appearing.  HENT:     Head: Normocephalic.  Eyes:     Conjunctiva/sclera: Conjunctivae  normal.  Cardiovascular:     Rate and Rhythm: Normal rate.  Pulmonary:     Effort: Pulmonary effort is normal. No respiratory distress.  Musculoskeletal:     Comments: Strength 5/5 lower extremities   Neurological:     General: No focal deficit present.     Mental Status: He is alert and oriented to person, place, and time.  Psychiatric:        Mood and Affect: Mood normal.        Behavior: Behavior normal.     No results found for any visits on 05/28/22.  Assessment & Plan     Problem List Items Addressed This Visit       Cardiovascular and Mediastinum   Hypotension    Gradually progressing hypotension Encouraged increasing fluids Dec hctz dose to 12.5 mg ( half tablet)  Check bp at home, if >130/90 can restart hctz full dose        Other   Weakness of both lower extremities - Primary    Spinal vs vascular etiology Strength testing b/l lower extremities 5/5 but pt reports feels very fatigued after Given h/o of moderate- severe lumbar spinal stenosis, this is likely, will start with plain films and likely refer back to Dr Cari Caraway. H/o of mild atherosclerosis, will ref to vascular for r/o PAD      Relevant Orders   Ambulatory referral to Vascular Surgery   DG Lumbar Spine Complete     Return if symptoms worsen or fail to improve.      I, [name], PA-C have reviewed all documentation for this visit. The documentation on  05/28/2022 for the exam, diagnosis, procedures, and orders are all accurate and complete.  [name], PA-C [facility] [address] [place], [place], [address] Office: [phone] Fax: [place]

## 2022-05-29 ENCOUNTER — Other Ambulatory Visit: Payer: Self-pay | Admitting: Physician Assistant

## 2022-05-29 ENCOUNTER — Encounter: Payer: Self-pay | Admitting: Internal Medicine

## 2022-05-29 DIAGNOSIS — J0110 Acute frontal sinusitis, unspecified: Secondary | ICD-10-CM

## 2022-05-29 DIAGNOSIS — G4719 Other hypersomnia: Secondary | ICD-10-CM

## 2022-05-29 NOTE — Telephone Encounter (Signed)
Requested medications are due for refill today.  unsure  Requested medications are on the active medications list.  yes  Last refill. 05/04/2022 30 1 rf  Future visit scheduled.   yes  Notes to clinic.  Please review for refill.    Requested Prescriptions  Pending Prescriptions Disp Refills   Azelastine HCl 137 MCG/SPRAY SOLN [Pharmacy Med Name: AZELASTINE 0.1% (137 MCG) SPRY]  1    Sig: PLACE 1 SPRAY INTO BOTH NOSTRILS 2 (TWO) TIMES DAILY. USE IN EACH NOSTRIL AS DIRECTED     Ear, Nose, and Throat: Nasal Preparations - Antiallergy Passed - 05/29/2022  2:33 PM      Passed - Valid encounter within last 12 months    Recent Outpatient Visits           Yesterday Weakness of both lower extremities   [facility] Thedore Mins, [place], PA-C   1 month ago Daytime sleepiness   [facility] Thedore Mins, [place], PA-C   1 month ago Hypertension associated with diabetes [facility])   [facility] [name], PA-C   2 months ago Need for influenza vaccination   [facility] [name], MD   3 months ago Walking pneumonia   [facility] [name], MD       Future Appointments             In 2 months Thedore Mins, Ria Comment, PA-C [facility], [place]   In 9 months Hollice Espy, MD [facility] [place]   In 1 year Ralene Bathe, MD [place]

## 2022-05-31 NOTE — Procedures (Signed)
[place] Report Part I                                                                 Phone: [phone] Fax: [phone]  Patient Name: Manuel Stafford, Manuel Stafford. Acquisition Number: [id number]  Date of Birth: [birth year] Acquisition Date: 05/29/2022  Referring Physician: [name] PA-C     History: The patient is a 76 year old male who was referred for evaluation of possible sleep apnea with snoring and sleepiness. Medical History: OSA, diabetes, obesity, hypertension  Medications: aspirin, hydrochlorothiazide, ketoconazole, meclizine, metformin, vitamin D, ozempic, pantoprazole, ropinirole, rosuvastatin, tadalafil, telmisartan, tizanidine  Procedure: This routine overnight polysomnogram was performed on the Alice 4 or 5 using the standard diagnostic protocol. This included 6 channels of EEG, 2 channels of EOG, chin EMG, bilateral anterior tibialis EMG, nasal/oral thermister, PTAF (nasal pressure transducer), chest and abdominal wall movements, EKG, pulse oximetry and EtCO2 when appropriate.  Description: The total recording time was 443.5 minutes. The total sleep time was 316.5 minutes. There were a total of 119.5 minutes of wakefulness after sleep onset for a reducedsleep efficiency of 71.4%. The latency to sleep onset was short at 7.5 minutes. The R sleep onset latency was prolonged at 199.5 minutes. Sleep parameters, as a percentage of the total sleep time, demonstrated 13.7% of sleep was in N1 sleep, 70.3% N2, 0.0% N3 and 16.0% R sleep. There were a total of 43 arousals for an arousal index of 8.2 arousals per hour of sleep that was normal.  Respiratory monitoring demonstrated frequent mild to moderate degree of snoring in all positions. There were 192 apneas and hypopneas for an Apnea Hypopnea Index of 36.4 apneas and hypopneas per hour of sleep. The REM related apnea hypopnea index was 36.8/hr of REM sleep compared to a NREM AHI of 35.9/hr. The Respiratory  Disturbance Index, which includes 5 respiratory effort related arousals (RERAs), was 37.3 respiratory events per hour of sleep.  The average duration of the respiratory events was 29.6 seconds with a maximum duration of 74.0 seconds. The respiratory events occurred in all positions. The respiratory events were associated with peripheral oxygen desaturations on the average to 90%. The lowest oxygen desaturation associated with a respiratory event was 76%. Additionally, the baseline oxygen saturation during wakefulness was 93%, during NREM sleep averaged 94%, and during REM sleep averaged  91%. The total duration of oxygen < 90% was 20.2 minutes and <80% was 0.2 minutes.  Cardiac monitoring- did not demonstrate transient cardiac decelerations associated with the apneas. There were no significant cardiac rhythm irregularities.   Periodic limb movement monitoring- demonstrated that there were 30 periodic limb movements for a periodic limb movement index of 5.7 periodic limb movements per hour of sleep.   Impression: This routine overnight polysomnogram demonstrated significant obstructive sleep apnea with an overall Apnea Hypopnea Index of 36.4 apneas and hypopneas per hour of sleep, while the respiratory disturbance index, which includes RERAs, was 37.3/hr. The respiratory events were associated with peripheral oxygen desaturations on the average to 90% with the lowest desaturation to 76%.    There were few periodic limb movements that commonly are not significant. Clinical correlation would be suggested.   There was a reduced sleep efficiency increased awakeningsfailure to progress  into the deeper stages of sleep.These findings would appear to be due to the obstructive sleep apnea.  Recommendations:    A CPAP titration would be recommended due to the severity of the sleep apnea.    Allyne Gee, MD [facility] Diplomate ABMS Pulmonary Critical Care Medicine Sleep Medicine Electronically reviewed and  digitally signed    [place] Report Part II  Phone: [phone] Fax: [phone]  Patient last name Cihlar Neck Size 16.0   in. Acquisition [id number]  Patient first name [name] Weight 220.0 lbs. Started 05/29/2022 at 9:53:35 PM  Birth date July 06, 1945 Height 72.0 in. Stopped 05/30/2022 at 5:25:05 AM  Age 28 BMI 29.8 lb/in2 Duration 443.5  Study Type Adult      Juleen Starr - RPSGT, Margaretmary Eddy Sleep Data: Lights Out: 10:00:05 PM Sleep Onset: 10:07:35 PM  Lights On: 5:23:35 AM Sleep Efficiency: 71.4 %  Total Recording Time: 443.5 min Sleep Latency (from Lights Off) 7.5 min  Total Sleep Time (TST): 316.5 min R Latency (from Sleep Onset): 199.5 min  Sleep Period Time: 435.0 min Total number of awakenings: 17  Wake during sleep: 118.5 min Wake After Sleep Onset (WASO): 119.5 min   Sleep Data:         Arousal Summary: Stage  Latency from lights out (min) Latency from sleep onset (min) Duration (min) % Total Sleep Time  Normal values  N 1 7.5 0.0 43.5 13.7 (5%)  N 2 8.5 1.0 222.5 70.3 (50%)  N 3       0.0 0.0 (20%)  R 207.0 199.5 50.5 16.0 (25%)    Number Index  Spontaneous 22 4.2  Apneas & Hypopneas 21 4.0  RERAs 5 0.9       (Apneas & Hypopneas & RERAs)  (26) (4.9)  Limb Movement 0 0.0  Snore 0 0.0  TOTAL 48 9.1     Respiratory Data:  CA OA MA Apnea Hypopnea* A+ H RERA Total  Number 9 32 2 43 149 192 5 197  Mean Dur (sec) 19.4 20.0 24.0 20.0 32.5 29.7 24.0 29.6  Max Dur (sec) 28.0 37.5 25.5 37.5 74.0 74.0 27.5 74.0  Total Dur (min) 2.9 10.6 0.8 14.4 80.7 95.1 2.0 97.1  % of TST 0.9 3.4 0.3 4.5 25.5 30.0 0.6 30.7  Index (#/h TST) 1.7 6.1 0.4 8.2 28.2 36.4 0.9 37.3  *Hypopneas scored based on 4% or greater desaturation.  Sleep Stage:     Body Position Data:   REM NREM TST  AHI 36.8 35.9 36.4  RDI 36.8 37.0 37.3         Sleep (min) TST (%) REM (min) NREM (min) CA (#) OA (#) MA (#) HYP (#) AHI (#/h) RERA (#) RDI (#/h)  Desat (#)  Supine 136.0 42.97 1.3 134.'7 4 19 2 '$ 93 52.1 4 53.8 154  Non-Supine 180.50 57.03 49.20 131.30 5.00 13.00 0.00 56.00 24.60 1.00 24.93 103.00  Left: 148.6 46.95 49.2 99.4 0 3 0 37 16.2 1 16.6 64  Right: 31.9 10.08 0.0 31.'9 5 10 '$ 0 19 63.9 0 63.9 39  UP: 0.0 0.00 0.0 0.0 0 0 0 0 0.0 0 0.00 0       Snoring: Total number of snoring episodes  0  Total time with snoring    min (   % of sleep)   Oximetry Distribution:             WK REM NREM TOTAL  Average (%)  93 91 94 93  < 90% 1.5 16.2 2.5 20.2  < 80% 0.1 0.0 0.1 0.2  < 70% 0.1 0.0 0.0 0.1  # of Desaturations* 8 39 210 257  Desat Index (#/hour) 4.2 46.3 47.5 48.8  Desat Max (%) '10 16 20 20  '$ Desat Max Dur (sec) 52.0 96.0 90.0 96.0  Approx Min O2 during sleep 76  Approx min O2 during a respiratory event 76  Was Oxygen added (Y/N) and final rate No:   0 LPM  *Desaturations based on 3% or greater drop from baseline.   Cheyne Stokes Breathing: None Present   Hypoventilation: None Present    Heart Rate Summary:  Average Heart Rate During Sleep 54.7 bpm      Highest Heart Rate During Sleep (95th %) 68.0 bpm      Highest Heart Rate During Sleep 158 bpm (artifact)  Highest Heart Rate During Recording (TIB) 194 bpm (artifact)   Heart Rate Observations: Event Type # Events   Bradycardia 0 Lowest HR Scored: N/A  Sinus Tachycardia During Sleep 0 Highest HR Scored: N/A  Narrow Complex Tachycardia 0 Highest HR Scored: N/A  Wide Complex Tachycardia 0 Highest HR Scored: N/A  Asystole 0 Longest Pause: N/A  Atrial Fibrillation 0 Duration Longest Event: N/A  Other Arrythmias  No Type:    Periodic Limb Movement Data: (Primary legs unless otherwise noted) Total # Limb Movement 30 Limb Movement Index 5.7  Total # PLMS 30 PLMS Index 5.7  Total # PLMS Arousals    PLMS Arousal Index     Percentage Sleep Time with PLMS 14.41mn (4.5 % sleep)  Mean Duration limb movements (secs) 171.7

## 2022-06-03 ENCOUNTER — Encounter: Payer: Self-pay | Admitting: Dermatology

## 2022-06-04 ENCOUNTER — Other Ambulatory Visit: Payer: Self-pay | Admitting: Physician Assistant

## 2022-06-04 ENCOUNTER — Encounter: Payer: Self-pay | Admitting: Physician Assistant

## 2022-06-04 DIAGNOSIS — G4733 Obstructive sleep apnea (adult) (pediatric): Secondary | ICD-10-CM

## 2022-06-12 ENCOUNTER — Encounter (INDEPENDENT_AMBULATORY_CARE_PROVIDER_SITE_OTHER): Payer: Self-pay | Admitting: Nurse Practitioner

## 2022-06-12 ENCOUNTER — Ambulatory Visit (INDEPENDENT_AMBULATORY_CARE_PROVIDER_SITE_OTHER): Payer: PPO | Admitting: Nurse Practitioner

## 2022-06-12 VITALS — BP 146/80 | HR 82 | Resp 16 | Wt 229.8 lb

## 2022-06-12 DIAGNOSIS — N182 Chronic kidney disease, stage 2 (mild): Secondary | ICD-10-CM | POA: Diagnosis not present

## 2022-06-12 DIAGNOSIS — I739 Peripheral vascular disease, unspecified: Secondary | ICD-10-CM | POA: Diagnosis not present

## 2022-06-12 DIAGNOSIS — E1122 Type 2 diabetes mellitus with diabetic chronic kidney disease: Secondary | ICD-10-CM

## 2022-06-12 DIAGNOSIS — M5136 Other intervertebral disc degeneration, lumbar region: Secondary | ICD-10-CM

## 2022-06-14 ENCOUNTER — Other Ambulatory Visit: Payer: Self-pay | Admitting: Physician Assistant

## 2022-06-14 DIAGNOSIS — J0110 Acute frontal sinusitis, unspecified: Secondary | ICD-10-CM

## 2022-06-25 ENCOUNTER — Encounter (INDEPENDENT_AMBULATORY_CARE_PROVIDER_SITE_OTHER): Payer: Self-pay | Admitting: Nurse Practitioner

## 2022-06-25 NOTE — Progress Notes (Signed)
Subjective:    Patient ID: Manuel Stafford., male    DOB: 1946/06/11, 77 y.o.   MRN: [id number] Chief Complaint  Patient presents with   New Patient (Initial Visit)    Ref Thedore Mins consult bilateral leg weakness    Manuel Stafford is a 77 year old male presents today as a referral from [place] Drubel,MD in regards to pain in his lower extremity and weakness in his legs with walking.  He notes that this has happened in the last 6 months or so.  He notes that after sitting for a while his legs began to feel weak.  He notes that they actually feel fine once he actually gets up and moving.  However he notes that with going up inclines or up stairs his legs get weak again.  He does note that he has an upcoming referral to see Dr. Daun Peacock with neurosurgery as well.  He denies rest pain or any open wounds or ulcerations.    Review of Systems  Cardiovascular:  Negative for leg swelling.  Musculoskeletal:  Positive for arthralgias.  Neurological:  Positive for weakness.  All other systems reviewed and are negative.      Objective:   Physical Exam Vitals reviewed.  HENT:     Head: Normocephalic.  Cardiovascular:     Rate and Rhythm: Normal rate.     Pulses:          Dorsalis pedis pulses are 1+ on the right side and 1+ on the left side.  Pulmonary:     Effort: Pulmonary effort is normal.  Skin:    General: Skin is warm and dry.  Neurological:     Mental Status: He is alert and oriented to person, place, and time.  Psychiatric:        Mood and Affect: Mood normal.        Behavior: Behavior normal.        Thought Content: Thought content normal.        Judgment: Judgment normal.     BP (!) 146/80 (BP Location: Left Arm)   Pulse 82   Resp 16   Wt 229 lb 12.8 oz (104.2 kg)   BMI 31.17 kg/m   Past Medical History:  Diagnosis Date   Anxiety    Aortic atherosclerosis (HCC)    Cervical osteophyte    Chronic lower back pain    Coronary artery disease    DDD  (degenerative disc disease), lumbar    Depression    Diastolic dysfunction 79/39/0300   a.) TTE 05/31/2021: EF >55%, mild BAE, mild RVE, triv TR/PR, mild MR, G1DD.   Erectile dysfunction    a.) on PDE5i (tadalafil)   Fatty liver disease, nonalcoholic    GERD (gastroesophageal reflux disease)    Hemorrhoids 2017   History of kidney stones    Hyperlipidemia    Hypertension    Hypertensive left ventricular hypertrophy    Left inguinal hernia    Lumbar spondylolysis    Lung nodules    OSA (obstructive sleep apnea)    a.) does not require nocturnal PAP therapy   Osteoarthritis    Parkinsonian features    RBBB (right bundle branch block)    Recurrent aspiration pneumonia (HCC)    RLS (restless legs syndrome)    a.) on ropinirole   T2DM (type 2 diabetes mellitus) ([place])    Tremor     Social History   Socioeconomic History   Marital status: Widowed    Spouse name: Not  on file   Number of children: 2   Years of education: Not on file   Highest education level: Associate degree: occupational, Hotel manager, or vocational program  Occupational History   Occupation: retired  Tobacco Use   Smoking status: Former    Packs/day: 2.00    Years: 15.00    Total pack years: 30.00    Types: Cigarettes    Quit date: 06/25/1974    Years since quitting: 48.0   Smokeless tobacco: Never  Vaping Use   Vaping Use: Never used  Substance and Sexual Activity   Alcohol use: Yes    Comment: occ wine   Drug use: No   Sexual activity: Not Currently  Other Topics Concern   Not on file  Social History Narrative   Not on file   Social Determinants of Health   Financial Resource Strain: Low Risk  (03/14/2020)   Overall Financial Resource Strain (CARDIA)    Difficulty of Paying Living Expenses: Not hard at all  Food Insecurity: No Food Insecurity (04/04/2022)   Hunger Vital Sign    Worried About Running Out of Food in the Last Year: Never true    [place] in the Last Year: Never true   Transportation Needs: No Transportation Needs (04/04/2022)   PRAPARE - Hydrologist (Medical): No    Lack of Transportation (Non-Medical): No  Physical Activity: Sufficiently Active (03/14/2020)   Exercise Vital Sign    Days of Exercise per Week: 5 days    Minutes of Exercise per Session: 30 min  Stress: No Stress Concern Present (03/14/2020)   [place]    Feeling of Stress : Not at all  Social Connections: [place] (03/14/2020)   Social Connection and Isolation Panel [NHANES]    Frequency of Communication with Friends and Family: More than three times a week    Frequency of Social Gatherings with Friends and Family: More than three times a week    Attends Religious Services: More than 4 times per year    Active Member of [employer] or Organizations: Yes    Attends Archivist Meetings: More than 4 times per year    Marital Status: Married  Human resources officer Violence: Not At Risk (04/04/2022)   Humiliation, Afraid, Rape, and Kick questionnaire    Fear of Current or Ex-Partner: No    Emotionally Abused: No    Physically Abused: No    Sexually Abused: No    Past Surgical History:  Procedure Laterality Date   ANTERIOR CERVICAL DECOMP/DISCECTOMY FUSION N/A 04/04/2022   Procedure: C3-6 OSTEOPHYTE REMOVAL;  Surgeon: Meade Maw, MD;  Location: [facility] ORS;  Service: Neurosurgery;  Laterality: N/A;   CIRCUMCISION     COLONOSCOPY  2007   COLONOSCOPY WITH PROPOFOL N/A 08/22/2016   Procedure: COLONOSCOPY WITH PROPOFOL;  Surgeon: Christene Lye, MD;  Location: [facility] END[facility]COPY;  Service: Endoscopy;  Laterality: N/A;   CYSTOSCOPY WITH STENT PLACEMENT Right 09/18/2015   Procedure: CYSTOSCOPY WITH STENT PLACEMENT;  Surgeon: Hollice Espy, MD;  Location: [facility] ORS;  Service: Urology;  Laterality: Right;   CYSTOSCOPY WITH URETEROSCOPY, STONE BASKETRY AND STENT PLACEMENT Right  10/03/2015   Procedure: CYSTOSCOPY WITH URETEROSCOPY, STONE BASKETRY AND STENT PLACEMENT;  Surgeon: Hollice Espy, MD;  Location: [facility] ORS;  Service: Urology;  Laterality: Right;   EXTRACORPOREAL SHOCK WAVE LITHOTRIPSY Right 09/15/2015   Procedure: EXTRACORPOREAL SHOCK WAVE LITHOTRIPSY (ESWL);  Surgeon: Nickie Retort, MD;  Location: [facility] ORS;  Service: Urology;  Laterality: Right;   HERNIA REPAIR     ROTATOR CUFF REPAIR Right 06/2012    Family History  Problem Relation Age of Onset   Hypertension Mother    Diabetes Father    CAD Father    Diabetes Sister    Heart disease Sister    CAD Sister     No Known Allergies     Latest Ref Rng & Units 03/26/2022   12:09 PM 11/07/2021    9:01 AM 10/31/2021    3:02 AM  CBC  WBC 4.0 - 10.5 K/uL 7.5  8.8  8.0   Hemoglobin 13.0 - 17.0 g/dL 13.8  14.5  11.8   Hematocrit 39.0 - 52.0 % 41.9  43.4  35.5   Platelets 150 - 400 K/uL 199  235  137       CMP     Component Value Date/Time   NA 137 03/26/2022 1209   NA 141 11/07/2021 0901   K 3.9 03/26/2022 1209   K 4.3 07/10/2012 0858   CL 102 03/26/2022 1209   CO2 29 03/26/2022 1209   GLUCOSE 95 03/26/2022 1209   BUN 23 03/26/2022 1209   BUN 21 11/07/2021 0901   CREATININE 1.38 (H) 03/26/2022 1209   CALCIUM 9.4 03/26/2022 1209   PROT 7.4 03/26/2022 1209   PROT 7.1 11/07/2021 0901   ALBUMIN 3.9 03/26/2022 1209   ALBUMIN 4.3 11/07/2021 0901   AST 28 03/26/2022 1209   ALT 22 03/26/2022 1209   ALKPHOS 91 03/26/2022 1209   BILITOT 0.8 03/26/2022 1209   BILITOT 0.4 11/07/2021 0901   GFRNONAA 53 (L) 03/26/2022 1209   GFRAA 55 (L) 03/16/2020 0810     No results found.     Assessment & Plan:   1. [facility]) Given the patient's description of symptoms it is possible that his issues are more within the iliac level.  Will have the patient undergo ABIs as well as an aortoiliac duplex to evaluate for possible vascular patient's lower extremity leg pain.  2. DDD (degenerative  disc disease), lumbar This could be a possible component of the patient's weakness, defer further workup to neurosurgery  3. Type 2 diabetes mellitus with stage 2 chronic kidney disease, without long-term current use of insulin (HCC) Continue hypoglycemic medications as already ordered, these medications have been reviewed and there are no changes at this time.  Hgb A1C to be monitored as already arranged by primary service   Current Outpatient Medications on File Prior to Visit  Medication Sig Dispense Refill   aspirin 81 MG tablet Take 81 mg by mouth daily.      Cholecalciferol (VITAMIN D3) 2000 UNITS capsule Take 2,000 Units by mouth daily.      Continuous Blood Gluc Sensor (FREESTYLE LIBRE 2 SENSOR) MISC USE AS DIRECTED. CONTINUOUS BLOOD SENSOR. 6 each 3   hydrochlorothiazide (HYDRODIURIL) 25 MG tablet Take 1 tablet (25 mg total) by mouth daily. (Patient taking differently: Take 25 mg by mouth every morning.) 90 tablet 1   ketoconazole (NIZORAL) 2 % cream Apply 1 Application topically at bedtime. 60 g 11   meclizine (ANTIVERT) 25 MG tablet Take 1 tablet (25 mg total) by mouth 3 (three) times daily as needed for dizziness. 90 tablet 1   metFORMIN (GLUCOPHAGE) 1000 MG tablet TAKE 1 TABLET (1,000 MG TOTAL) BY MOUTH 2 (TWO) TIMES DAILY WITH A MEAL. 180 tablet 3   MULTIPLE VITAMIN PO Take 1 tablet by mouth  daily.     OZEMPIC, 0.25 OR 0.5 MG/DOSE, 2 MG/3ML SOPN Please specify directions, refills and quantity (Patient taking differently: Inject 0.5 mg as directed once a week. On Thursday's) 12 mL 5   pantoprazole (PROTONIX) 40 MG tablet TAKE 1 TABLET BY MOUTH EVERY DAY 90 tablet 1   rOPINIRole (REQUIP) 0.25 MG tablet Take 5 mg by mouth at bedtime. 5 tablets daily     rosuvastatin (CRESTOR) 20 MG tablet TAKE 1 TABLET BY MOUTH EVERY DAY (Patient taking differently: Take 20 mg by mouth at bedtime.) 90 tablet 1   Saw Palmetto, Serenoa repens, (SAW PALMETTO BERRY) 160 MG CAPS Take 2 tablets by mouth  daily.      tadalafil (CIALIS) 5 MG tablet Take 1 tablet (5 mg total) by mouth daily. (Patient taking differently: Take 5 mg by mouth every morning.) 30 tablet 3   telmisartan (MICARDIS) 40 MG tablet TAKE 1 TABLET BY MOUTH EVERY DAY (Patient taking differently: Take 40 mg by mouth every morning.) 90 tablet 3   tiZANidine (ZANAFLEX) 4 MG tablet TAKE 1 TABLET BY MOUTH EVERYDAY AT BEDTIME (Patient taking differently: Take 4 mg by mouth at bedtime. TAKE 1 TABLET BY MOUTH EVERYDAY AT BEDTIME) 90 tablet 1   No current facility-administered medications on file prior to visit.    There are no Patient Instructions on file for this visit. No follow-ups on file.   Kris Hartmann, NP

## 2022-06-26 ENCOUNTER — Encounter: Payer: Self-pay | Admitting: Internal Medicine

## 2022-06-26 ENCOUNTER — Ambulatory Visit (INDEPENDENT_AMBULATORY_CARE_PROVIDER_SITE_OTHER): Payer: PPO | Admitting: Neurosurgery

## 2022-06-26 ENCOUNTER — Encounter: Payer: Self-pay | Admitting: Neurosurgery

## 2022-06-26 VITALS — BP 175/82 | HR 70 | Temp 97.7°F | Wt 232.8 lb

## 2022-06-26 DIAGNOSIS — Z09 Encounter for follow-up examination after completed treatment for conditions other than malignant neoplasm: Secondary | ICD-10-CM

## 2022-06-26 DIAGNOSIS — R1319 Other dysphagia: Secondary | ICD-10-CM

## 2022-06-26 DIAGNOSIS — M2578 Osteophyte, vertebrae: Secondary | ICD-10-CM

## 2022-06-26 DIAGNOSIS — G4733 Obstructive sleep apnea (adult) (pediatric): Secondary | ICD-10-CM

## 2022-06-26 DIAGNOSIS — M47812 Spondylosis without myelopathy or radiculopathy, cervical region: Secondary | ICD-10-CM

## 2022-06-26 NOTE — Progress Notes (Signed)
   REFERRING PHYSICIAN:  [name], Md No address on file  DOS: 04/04/22 cervical osteophyte removal C3-6   HISTORY OF PRESENT ILLNESS: Manuel R Maryan Char. is status post cervical osteophyte removal C3-6.  His swallowing has improved.  He is very happy with his improvements.  Some neck pain in the mornings, but is otherwise doing relatively well.  His swallowing is 80 to 90% improved.   PHYSICAL EXAMINATION:  NEUROLOGICAL:  General: In no acute distress.   Awake, alert, oriented to person, place, and time.  Pupils equal round and reactive to light.  Facial tone is symmetric.    Strength: Side Biceps Triceps Deltoid Interossei Grip Wrist Ext. Wrist Flex.  R '5 5 5 5 5 5 5  '$ L '5 5 5 5 5 5 5   '$ Incision c/d/I, no erythema or signs of infection. He has mild area of swelling lateral aspect of incision. No tenderness.   Imaging:  Nothing new to review.   Assessment / Plan: Manuel Stafford. is doing well s/p above surgery.  His voice and his swallowing have significantly improved since surgery.  I am very pleased with that.  He has been having some trouble with his legs.  He is having ABIs checked later this month.  I have asked him to touch base with [place] after that.  If those are normal, we will proceed with MRI scan of the lumbar spine.    Meade Maw MD Dept of Neurosurgery

## 2022-07-02 NOTE — Procedures (Signed)
[place] Report Part I  Phone: [phone] Fax: [phone]  Patient Name: Kayston, Jodoin Acquisition Number: [id number]  Date of Birth: 11-16-1945 Acquisition Date: 06/26/2022  Referring Physician: [name], PA-C     History: The patient is a 77 year old male with obstructive sleep apnea for CPAP titration. Medical History: OSA, diabetes, obesity and hypertension.  Medications: aspirin, hydrochlorothiazide, ketoconazole, meclizine, vitamin D, Ozempic, pantoprazole, ropinrole, rosuvastatin, tadalafil, telmisartan and tizanidine.  Procedure: This routine overnight polysomnogram was performed on the [facility] 5 using the standard CPAP protocol. This included 6 channels of EEG, 2 channels of EOG, chin EMG, bilateral anterior tibialis EMG, nasal/oral thermistor, PTAF (nasal pressure transducer), chest and abdominal wall movements, EKG, and pulse oximetry.  Description: The total recording time was 365.5 minutes. The total sleep time was 208.5 minutes. There were a total of 152.3 minutes of wakefulness after sleep onset for a poorsleep efficiency of 57.0%. The latency to sleep onset was short at 4.7 minutes. The R sleep onset latency wasprolonged at 196.5 minutes. Sleep parameters, as a percentage of the total sleep time, demonstrated 26.4% of sleep was in N1 sleep, 59.7% N2, 2.4% N3 and 11.5% R sleep. There were a total of 256 arousals for an arousal index of 73.7 arousals per hour of sleep that was elevated.  Overall, there were a total of 131 respiratory events for a respiratory disturbance index, which includes apneas, hypopneas and RERAs (increased respiratory effort) of 37.7 respiratory events per hour of sleep during the pressure titration. CPAP was initiated at 4 cm H2O at lights out, 11:27 p.m. It was titrated in 1-2 cm increments, largely for central apneas,  to the final pressure of 20 cm H2O. Central apneas appeared controlled at 17 cm H2O but  reemerged at higher pressures. All sleep was in the supine position.  Additionally, the baseline oxygen saturation during wakefulness was 97%, during NREM sleep averaged 96%, and during REM sleep averaged 96%. The total duration of oxygen < 90% was 0.2 minutes.  Cardiac monitoring-  There were no significant cardiac rhythm irregularities.   Periodic limb movement monitoring- did not demonstrate periodic limb movements.   Impression: This patient's obstructive sleep apnea demonstrated significant improvement with the utilization of nasal CPAP, however central apneas emerged and persisted throughout the study. Approximately 88% of the respiratory events were central apneas or hypopneas.     Recommendations: Would recommend a BiPAP SV titration. The use of a non-benzodiazepine sleep aid is suggested for the study. Products containing diphenhydramine such as Benadryl or "PM" medications and medications which suppress REM sleep should be avoided.  A large ResMed N20 mask was used. Chin strap used during study- yes. Humidifier used during study- yes.     Allyne Gee, MD, [facility] Diplomate ABMS-Pulmonary, Critical Care and Sleep Medicine  Electronically reviewed and digitally signed  [place] CPAP/BIPAP Polysomnogram Report Part II Phone: [phone] Fax: [phone]  Patient last name Podolak Neck Size 16.0 in. Acquisition [id number]  Patient first name Treyshon Weight 220.0 lbs. Started 06/26/2022 at 11:19:55 PM  Birth date 1945/11/22 Height 72.0 in. Stopped 06/27/2022 at 6:49:13 AM  Age 69      Type Adult BMI 29.8 lb/in2 Duration 365.5  Report generated by Juleen Starr, RPSGT  Reviewed by: Richelle Ito. Henke, PhD, ABSM, FAASM Sleep Data: Lights Out: 11:27:43 PM Sleep Onset: 11:32:25 PM  Lights On: 5:33:13 AM Sleep Efficiency: 57.0 %  Total Recording Time: 365.5  min Sleep Latency (from Lights Off) 4.7 min  Total Sleep Time (TST): 208.5 min R Latency (from Sleep Onset): 196.5  min  Sleep Period Time: 355.5 min Total number of awakenings: 78  Wake during sleep: 147.0 min Wake After Sleep Onset (WASO): 152.3 min   Sleep Data:         Arousal Summary: Stage  Latency from lights out (min) Latency from sleep onset (min) Duration (min) % Total Sleep Time  Normal values  N 1 4.7 0.0 55.0 26.4 (5%)  N 2 6.2 1.5 124.5 59.7 (50%)  N 3 247.2 242.5 5.0 2.4 (20%)  R 201.2 196.5 24.0 11.5 (25%)    Number Index  Spontaneous 128 36.8  Apneas & Hypopneas 107 30.8  RERAs 1 0.3       (Apneas & Hypopneas & RERAs)  (108) (31.1)  Limb Movement 19 5.5  Snore 2 0.6  TOTAL 257 74.0     Respiratory Data:  CA OA MA Apnea Hypopnea* A+ H RERA Total  Number 93 7 8 108 12 120 1 121  Mean Dur (sec) 23.2 27.4 34.1 24.3 28.5 24.7 21.5 24.7  Max Dur (sec) 46.5 36.0 49.5 49.5 41.5 49.5 21.5 49.5  Total Dur (min) 35.9 3.2 4.5 43.7 5.7 49.4 0.4 49.7  % of TST 17.2 1.5 2.2 21.0 2.7 23.7 0.2 23.9  Index (#/h TST) 26.8 2.0 2.3 31.1 3.5 34.5 0.3 34.8  *Hypopneas scored based on 4% or greater desaturation.  Sleep Stage:         REM NREM TST  AHI 0.0 38.0 34.5  RDI 0.0 38.4 34.8    Sleep (min) TST (%) REM (min) NREM (min) CA (#) OA (#) MA (#) HYP (#) AHI (#/h) RERA (#) RDI (#/h) Desat (#)  Supine 208.5 100.00 24.0 184.5 93 '7 8 12 '$ 34.5 1 34.8 95  Non-Supine 0.00 0.00 0.00 0.00 0.00 0.00 0.00 0.00 0.00 0 0.00 0.00     Snoring: Total number of snoring episodes  2  Total time with snoring 0.1 min (0.0 % of sleep)   Oximetry Distribution:             WK REM NREM TOTAL  Average (%)   97 96 96 96  < 90% 0.2 0.0 0.0 0.2  < 80% 0.0 0.0 0.0 0.0  < 70% 0.0 0.0 0.0 0.0  # of Desaturations* 9 0 68 77  Desat Index (#/hour) 3.6 0.0 22.1 22.2  Desat Max (%) 9 0 12 12  Desat Max Dur (sec) 64.0 0.0 106.0 106.0  Approx Min O2 during sleep 87  Approx min O2 during a respiratory event 87  Was Oxygen added (Y/N) and final rate No:   0 LPM  *Desaturations based on 3% or greater  drop from baseline.   Cheyne Stokes Breathing: None Present    Heart Rate Summary:  Average Heart Rate During Sleep 66.2 bpm      Highest Heart Rate During Sleep (95th %) 255.0 bpm (artifact)  Highest Heart Rate During Sleep 255 bpm (artifact)  Highest Heart Rate During Recording (TIB) 255 bpm (artifact)   Heart Rate Observations: Event Type # Events   Bradycardia 0 Lowest HR Scored: N/A  Sinus Tachycardia During Sleep 0 Highest HR Scored: N/A  Narrow Complex Tachycardia 0 Highest HR Scored: N/A  Wide Complex Tachycardia 0 Highest HR Scored: N/A  Asystole 0 Longest Pause: N/A  Atrial Fibrillation 0 Duration Longest Event: N/A  Other Arrythmias  No Type:  Periodic Limb Movement Data: (Primary legs unless otherwise noted) Total # Limb Movement 30 Limb Movement Index 8.6  Total # PLMS    PLMS Index     Total # PLMS Arousals    PLMS Arousal Index     Percentage Sleep Time with PLMS   min (   % sleep)  Mean Duration limb movements (secs)       IPAP Level (cmH2O) EPAP Level (cmH2O) Total Duration (min) Sleep Duration (min) Sleep (%) REM (%) CA  #) OA # MA # HYP #) AHI (#/hr) RERAs # RERAs (#/hr) RDI (#/hr)  4 4 8.1 6.1 75.3 0.0 8 1 0 0 88.5 0 0.0 88.'5  6 6 '$ 21.9 11.5 52.5 0.0 '14 2 1 '$ 0 88.7 0 0.0 88.'7  8 8 '$ 7.2 5.4 75.0 0.0 8 0 1 0 100.0 0 0.0 100.0  10 10 38.0 11.2 29.5 0.0 9 0 2 1 64.3 0 0.0 64.3  12 0 2.6 0.0 0.0 0.0 0 0 0 0 0.0 0 0.0 0.0  12 12 35.2 20.0 56.8 0.0 '10 3 2 6 '$ 63.0 0 0.0 63.0  13 13 4.3 3.4 79.1 0.0 5 0 0 0 88.2 0 0.0 88.'2  14 14 '$ 14.9 8.4 56.4 0.0 '10 1 1 1 '$ 92.9 0 0.0 92.'9  16 16 '$ 30.9 25.9 83.8 0.0 6 0 0 3 20.8 1 2.3 23.'2  17 17 '$ 108.3 84.2 77.7 22.2 12 0 0 1 9.3 0 0.0 9.'3  18 18 '$ 34.0 22.1 65.0 0.0 5 0 0 0 13.6 0 0.0 13.'6  20 19 '$ 3.0 0.1 3.3 0.0 0 0 0 0 0.0 0 0.0 0.0  21 20 35.3 8.0 22.7 0.0 6 0 1 0 52.5 0 0.0 52.5

## 2022-07-05 ENCOUNTER — Encounter: Payer: Self-pay | Admitting: Physician Assistant

## 2022-07-05 DIAGNOSIS — H52213 Irregular astigmatism, bilateral: Secondary | ICD-10-CM | POA: Diagnosis not present

## 2022-07-05 DIAGNOSIS — E119 Type 2 diabetes mellitus without complications: Secondary | ICD-10-CM | POA: Diagnosis not present

## 2022-07-05 DIAGNOSIS — H2513 Age-related nuclear cataract, bilateral: Secondary | ICD-10-CM | POA: Diagnosis not present

## 2022-07-05 DIAGNOSIS — H524 Presbyopia: Secondary | ICD-10-CM | POA: Diagnosis not present

## 2022-07-05 DIAGNOSIS — H5203 Hypermetropia, bilateral: Secondary | ICD-10-CM | POA: Diagnosis not present

## 2022-07-05 DIAGNOSIS — G4731 Primary central sleep apnea: Secondary | ICD-10-CM | POA: Insufficient documentation

## 2022-07-05 NOTE — Telephone Encounter (Signed)
Please review.  KP

## 2022-07-11 DIAGNOSIS — E119 Type 2 diabetes mellitus without complications: Secondary | ICD-10-CM | POA: Diagnosis not present

## 2022-07-11 DIAGNOSIS — Z7984 Long term (current) use of oral hypoglycemic drugs: Secondary | ICD-10-CM | POA: Diagnosis not present

## 2022-07-11 DIAGNOSIS — Z87891 Personal history of nicotine dependence: Secondary | ICD-10-CM | POA: Diagnosis not present

## 2022-07-11 DIAGNOSIS — I10 Essential (primary) hypertension: Secondary | ICD-10-CM | POA: Diagnosis not present

## 2022-07-11 DIAGNOSIS — Z7985 Long-term (current) use of injectable non-insulin antidiabetic drugs: Secondary | ICD-10-CM | POA: Diagnosis not present

## 2022-07-13 ENCOUNTER — Other Ambulatory Visit: Payer: Self-pay | Admitting: Physician Assistant

## 2022-07-16 ENCOUNTER — Other Ambulatory Visit (INDEPENDENT_AMBULATORY_CARE_PROVIDER_SITE_OTHER): Payer: Self-pay | Admitting: Nurse Practitioner

## 2022-07-16 DIAGNOSIS — I739 Peripheral vascular disease, unspecified: Secondary | ICD-10-CM

## 2022-07-17 ENCOUNTER — Other Ambulatory Visit: Payer: Self-pay | Admitting: Physician Assistant

## 2022-07-17 ENCOUNTER — Telehealth: Payer: Self-pay | Admitting: Physician Assistant

## 2022-07-17 DIAGNOSIS — N528 Other male erectile dysfunction: Secondary | ICD-10-CM

## 2022-07-17 DIAGNOSIS — N200 Calculus of kidney: Secondary | ICD-10-CM

## 2022-07-17 DIAGNOSIS — F5232 Male orgasmic disorder: Secondary | ICD-10-CM

## 2022-07-17 MED ORDER — TADALAFIL 5 MG PO TABS: 5.0000 mg | ORAL_TABLET | Freq: Every day | ORAL | 3 refills | Status: DC

## 2022-07-17 NOTE — Telephone Encounter (Signed)
Refilled

## 2022-07-17 NOTE — Telephone Encounter (Signed)
[facility] faxed refill request for the following medications:   tadalafil (CIALIS) 5 MG tablet     Please advise

## 2022-07-18 ENCOUNTER — Ambulatory Visit (INDEPENDENT_AMBULATORY_CARE_PROVIDER_SITE_OTHER): Payer: PPO

## 2022-07-18 ENCOUNTER — Ambulatory Visit (INDEPENDENT_AMBULATORY_CARE_PROVIDER_SITE_OTHER): Payer: PPO | Admitting: Nurse Practitioner

## 2022-07-18 ENCOUNTER — Encounter (INDEPENDENT_AMBULATORY_CARE_PROVIDER_SITE_OTHER): Payer: Self-pay | Admitting: Nurse Practitioner

## 2022-07-18 VITALS — BP 137/75 | HR 69 | Ht 72.0 in | Wt 232.0 lb

## 2022-07-18 DIAGNOSIS — N182 Chronic kidney disease, stage 2 (mild): Secondary | ICD-10-CM | POA: Diagnosis not present

## 2022-07-18 DIAGNOSIS — M5136 Other intervertebral disc degeneration, lumbar region: Secondary | ICD-10-CM | POA: Diagnosis not present

## 2022-07-18 DIAGNOSIS — E1122 Type 2 diabetes mellitus with diabetic chronic kidney disease: Secondary | ICD-10-CM | POA: Diagnosis not present

## 2022-07-18 DIAGNOSIS — I739 Peripheral vascular disease, unspecified: Secondary | ICD-10-CM

## 2022-07-22 ENCOUNTER — Other Ambulatory Visit: Payer: Self-pay | Admitting: Urology

## 2022-07-22 DIAGNOSIS — N528 Other male erectile dysfunction: Secondary | ICD-10-CM

## 2022-07-22 DIAGNOSIS — F5232 Male orgasmic disorder: Secondary | ICD-10-CM

## 2022-07-22 DIAGNOSIS — N200 Calculus of kidney: Secondary | ICD-10-CM

## 2022-07-23 ENCOUNTER — Telehealth: Payer: Self-pay | Admitting: Physician Assistant

## 2022-07-23 DIAGNOSIS — N200 Calculus of kidney: Secondary | ICD-10-CM

## 2022-07-23 DIAGNOSIS — F5232 Male orgasmic disorder: Secondary | ICD-10-CM

## 2022-07-23 DIAGNOSIS — N528 Other male erectile dysfunction: Secondary | ICD-10-CM

## 2022-07-23 MED ORDER — TADALAFIL 5 MG PO TABS: 5.0000 mg | ORAL_TABLET | Freq: Every day | ORAL | 3 refills | Status: DC

## 2022-07-23 NOTE — Telephone Encounter (Signed)
Medication was sent to [facility]. A fax was sent in from [facility] on 07/22/2022.  [facility] faxed refill request for the following medications:  tadalafil (CIALIS) 20 MG tablet     Please advise

## 2022-07-30 ENCOUNTER — Other Ambulatory Visit: Payer: Self-pay | Admitting: Family Medicine

## 2022-07-30 ENCOUNTER — Encounter: Payer: Self-pay | Admitting: Physician Assistant

## 2022-07-30 DIAGNOSIS — N528 Other male erectile dysfunction: Secondary | ICD-10-CM

## 2022-07-30 DIAGNOSIS — N200 Calculus of kidney: Secondary | ICD-10-CM

## 2022-07-30 DIAGNOSIS — F5232 Male orgasmic disorder: Secondary | ICD-10-CM

## 2022-07-31 NOTE — Telephone Encounter (Signed)
Pt has called back again saying Manuel Stafford is tell ing him they have not received this prescription

## 2022-08-01 ENCOUNTER — Telehealth: Payer: Self-pay | Admitting: Physician Assistant

## 2022-08-01 NOTE — Telephone Encounter (Signed)
Confirmed with Manuel Stafford at pharmacy that this was received and patient should be on day 7 with 3 refills remaining. Patient is requesting a refill on 20 mg via mychart. Should rx be for 20 mg. Please advise

## 2022-08-01 NOTE — Telephone Encounter (Signed)
Responded to patient via mychart as this is a duplicate encounter

## 2022-08-01 NOTE — Telephone Encounter (Signed)
[facility] faxed refill request for the following medications:   tadalafil (CIALIS) 5 MG tablet     Please advise

## 2022-08-05 ENCOUNTER — Encounter (INDEPENDENT_AMBULATORY_CARE_PROVIDER_SITE_OTHER): Payer: Self-pay | Admitting: Nurse Practitioner

## 2022-08-05 NOTE — Progress Notes (Signed)
Subjective:    Patient ID: Manuel Patella., male    DOB: 1946/03/17, 77 y.o.   MRN: [id number] Chief Complaint  Patient presents with   Follow-up    ABI + aorta    Manuel Stafford is a 77 year old male presents today as a referral from [place] Drubel,MD in regards to pain in his lower extremity and weakness in his legs with walking.  He notes that this has happened in the last 6 months or so.  He notes that after sitting for a while his legs began to feel weak.  He notes that they actually feel fine once he actually gets up and moving.  However he notes that with going up inclines or up stairs his legs get weak again.  He recently saw Dr. Mcarthur Rossetti that advised that if his studies today were negative that evaluation of the lower spine would be indicated.  He denies rest pain or any open wounds or ulcerations.  Today noninvasive studies show an ABI of 1.22 on the left and 1.13 on the right.  The patient has triphasic tibial artery waveforms bilaterally with normal toe waveforms bilaterally.  Additional studies show the patient has normal flow throughout the aortoiliac system.  Very minimal atherosclerosis was noted.  No evidence of an abdominal aortic aneurysm.    Review of Systems  Cardiovascular:  Negative for leg swelling.  Musculoskeletal:  Positive for arthralgias.  Neurological:  Positive for weakness.  All other systems reviewed and are negative.      Objective:   Physical Exam Vitals reviewed.  HENT:     Head: Normocephalic.  Cardiovascular:     Rate and Rhythm: Normal rate.     Pulses:          Dorsalis pedis pulses are detected w/ Doppler on the right side and detected w/ Doppler on the left side.       Posterior tibial pulses are detected w/ Doppler on the right side and detected w/ Doppler on the left side.  Pulmonary:     Effort: Pulmonary effort is normal.  Skin:    General: Skin is warm and dry.  Neurological:     Mental Status: He is alert and oriented to  person, place, and time.  Psychiatric:        Mood and Affect: Mood normal.        Behavior: Behavior normal.        Thought Content: Thought content normal.        Judgment: Judgment normal.     BP 137/75   Pulse 69   Ht 6' (1.829 m)   Wt 232 lb (105.2 kg)   BMI 31.46 kg/m   Past Medical History:  Diagnosis Date   Anxiety    Aortic atherosclerosis (HCC)    Cervical osteophyte    Chronic lower back pain    Coronary artery disease    DDD (degenerative disc disease), lumbar    Depression    Diastolic dysfunction 0000000   a.) TTE 05/31/2021: EF >55%, mild BAE, mild RVE, triv TR/PR, mild MR, G1DD.   Erectile dysfunction    a.) on PDE5i (tadalafil)   Fatty liver disease, nonalcoholic    GERD (gastroesophageal reflux disease)    Hemorrhoids 2017   History of kidney stones    Hyperlipidemia    Hypertension    Hypertensive left ventricular hypertrophy    Left inguinal hernia    Lumbar spondylolysis    Lung nodules    OSA (  obstructive sleep apnea)    a.) does not require nocturnal PAP therapy   Osteoarthritis    Parkinsonian features    RBBB (right bundle branch block)    Recurrent aspiration pneumonia (HCC)    RLS (restless legs syndrome)    a.) on ropinirole   T2DM (type 2 diabetes mellitus) ([place])    Tremor     Social History   Socioeconomic History   Marital status: Widowed    Spouse name: Not on file   Number of children: 2   Years of education: Not on file   Highest education level: Associate degree: occupational, Hotel manager, or vocational program  Occupational History   Occupation: retired  Tobacco Use   Smoking status: Former    Packs/day: 2.00    Years: 15.00    Total pack years: 30.00    Types: Cigarettes    Quit date: 06/25/1974    Years since quitting: 48.1   Smokeless tobacco: Never  Vaping Use   Vaping Use: Never used  Substance and Sexual Activity   Alcohol use: Yes    Comment: occ wine   Drug use: No   Sexual activity: Not Currently   Other Topics Concern   Not on file  Social History Narrative   Not on file   Social Determinants of Health   Financial Resource Strain: Low Risk  (03/14/2020)   Overall Financial Resource Strain (CARDIA)    Difficulty of Paying Living Expenses: Not hard at all  Food Insecurity: No Food Insecurity (04/04/2022)   Hunger Vital Sign    Worried About Running Out of Food in the Last Year: Never true    [place] in the Last Year: Never true  Transportation Needs: No Transportation Needs (04/04/2022)   PRAPARE - Hydrologist (Medical): No    Lack of Transportation (Non-Medical): No  Physical Activity: Sufficiently Active (03/14/2020)   Exercise Vital Sign    Days of Exercise per Week: 5 days    Minutes of Exercise per Session: 30 min  Stress: No Stress Concern Present (03/14/2020)   Cable    Feeling of Stress : Not at all  Social Connections: [place] (03/14/2020)   Social Connection and Isolation Panel [NHANES]    Frequency of Communication with Friends and Family: More than three times a week    Frequency of Social Gatherings with Friends and Family: More than three times a week    Attends Religious Services: More than 4 times per year    Active Member of [employer] or Organizations: Yes    Attends Archivist Meetings: More than 4 times per year    Marital Status: Married  Human resources officer Violence: Not At Risk (04/04/2022)   Humiliation, Afraid, Rape, and Kick questionnaire    Fear of Current or Ex-Partner: No    Emotionally Abused: No    Physically Abused: No    Sexually Abused: No    Past Surgical History:  Procedure Laterality Date   ANTERIOR CERVICAL DECOMP/DISCECTOMY FUSION N/A 04/04/2022   Procedure: C3-6 OSTEOPHYTE REMOVAL;  Surgeon: Meade Maw, MD;  Location: [facility] ORS;  Service: Neurosurgery;  Laterality: N/A;   CIRCUMCISION     COLONOSCOPY   2007   COLONOSCOPY WITH PROPOFOL N/A 08/22/2016   Procedure: COLONOSCOPY WITH PROPOFOL;  Surgeon: Christene Lye, MD;  Location: [facility] ENDOSCOPY;  Service: Endoscopy;  Laterality: N/A;   CYSTOSCOPY WITH STENT PLACEMENT Right 09/18/2015  Procedure: CYSTOSCOPY WITH STENT PLACEMENT;  Surgeon: Hollice Espy, MD;  Location: [facility] ORS;  Service: Urology;  Laterality: Right;   CYSTOSCOPY WITH URETEROSCOPY, STONE BASKETRY AND STENT PLACEMENT Right 10/03/2015   Procedure: CYSTOSCOPY WITH URETEROSCOPY, STONE BASKETRY AND STENT PLACEMENT;  Surgeon: Hollice Espy, MD;  Location: [facility] ORS;  Service: Urology;  Laterality: Right;   EXTRACORPOREAL SHOCK WAVE LITHOTRIPSY Right 09/15/2015   Procedure: EXTRACORPOREAL SHOCK WAVE LITHOTRIPSY (ESWL);  Surgeon: Nickie Retort, MD;  Location: [facility] ORS;  Service: Urology;  Laterality: Right;   HERNIA REPAIR     ROTATOR CUFF REPAIR Right 06/2012    Family History  Problem Relation Age of Onset   Hypertension Mother    Diabetes Father    CAD Father    Diabetes Sister    Heart disease Sister    CAD Sister     No Known Allergies     Latest Ref Rng & Units 03/26/2022   12:09 PM 11/07/2021    9:01 AM 10/31/2021    3:02 AM  CBC  WBC 4.0 - 10.5 K/uL 7.5  8.8  8.0   Hemoglobin 13.0 - 17.0 g/dL 13.8  14.5  11.8   Hematocrit 39.0 - 52.0 % 41.9  43.4  35.5   Platelets 150 - 400 K/uL 199  235  137       CMP     Component Value Date/Time   NA 137 03/26/2022 1209   NA 141 11/07/2021 0901   K 3.9 03/26/2022 1209   K 4.3 07/10/2012 0858   CL 102 03/26/2022 1209   CO2 29 03/26/2022 1209   GLUCOSE 95 03/26/2022 1209   BUN 23 03/26/2022 1209   BUN 21 11/07/2021 0901   CREATININE 1.38 (H) 03/26/2022 1209   CALCIUM 9.4 03/26/2022 1209   PROT 7.4 03/26/2022 1209   PROT 7.1 11/07/2021 0901   ALBUMIN 3.9 03/26/2022 1209   ALBUMIN 4.3 11/07/2021 0901   AST 28 03/26/2022 1209   ALT 22 03/26/2022 1209   ALKPHOS 91 03/26/2022 1209   BILITOT 0.8 03/26/2022  1209   BILITOT 0.4 11/07/2021 0901   GFRNONAA 53 (L) 03/26/2022 1209   GFRAA 55 (L) 03/16/2020 0810     No results found.     Assessment & Plan:   1. Claudication [facility]) Recommend:  The patient has atypical pain symptoms for vascular disease and on exam I do not find evidence of vascular pathology that would explain the patient's symptoms.  Noninvasive studies do not identify significant vascular problems  I suspect the patient is c/o pseudoclaudication.  Patient should have an evaluation of the LS spine which I defer to  the Spine service.  The patient should continue walking and begin a more formal exercise program. The patient should continue his antiplatelet therapy and aggressive treatment of the lipid abnormalities.  Patient will follow-up with me on a PRN basis.  2. DDD (degenerative disc disease), lumbar Based on his noninvasive studies today, I suspect that the symptoms are derived from his lower back.  Patient is advised to follow with Dr. Cari Caraway for further evaluation.  3. Type 2 diabetes mellitus with stage 2 chronic kidney disease, without long-term current use of insulin (HCC) Continue hypoglycemic medications as already ordered, these medications have been reviewed and there are no changes at this time.  Hgb A1C to be monitored as already arranged by primary service   Current Outpatient Medications on File Prior to Visit  Medication Sig Dispense Refill   aspirin 81  MG tablet Take 81 mg by mouth daily.      Azelastine HCl 137 MCG/SPRAY SOLN PLACE 1 SPRAY INTO BOTH NOSTRILS 2 (TWO) TIMES DAILY. USE IN EACH NOSTRIL AS DIRECTED 90 mL 1   Cholecalciferol (VITAMIN D3) 2000 UNITS capsule Take 2,000 Units by mouth daily.      Continuous Blood Gluc Sensor (FREESTYLE LIBRE 2 SENSOR) MISC USE AS DIRECTED. CONTINUOUS BLOOD SENSOR. 6 each 3   hydrochlorothiazide (HYDRODIURIL) 25 MG tablet TAKE 1 TABLET BY MOUTH EVERY DAY 90 tablet 0   ketoconazole (NIZORAL) 2 % cream  Apply 1 Application topically at bedtime. 60 g 11   meclizine (ANTIVERT) 25 MG tablet Take 1 tablet (25 mg total) by mouth 3 (three) times daily as needed for dizziness. 90 tablet 1   metFORMIN (GLUCOPHAGE) 1000 MG tablet TAKE 1 TABLET (1,000 MG TOTAL) BY MOUTH 2 (TWO) TIMES DAILY WITH A MEAL. 180 tablet 3   MULTIPLE VITAMIN PO Take 1 tablet by mouth daily.     OZEMPIC, 0.25 OR 0.5 MG/DOSE, 2 MG/3ML SOPN Please specify directions, refills and quantity (Patient taking differently: Inject 0.5 mg as directed once a week. On Thursday's) 12 mL 5   pantoprazole (PROTONIX) 40 MG tablet TAKE 1 TABLET BY MOUTH EVERY DAY 90 tablet 1   rosuvastatin (CRESTOR) 20 MG tablet TAKE 1 TABLET BY MOUTH EVERY DAY 90 tablet 0   Saw Palmetto, Serenoa repens, (SAW PALMETTO BERRY) 160 MG CAPS Take 2 tablets by mouth daily.      telmisartan (MICARDIS) 40 MG tablet TAKE 1 TABLET BY MOUTH EVERY DAY (Patient taking differently: Take 40 mg by mouth every morning.) 90 tablet 3   tiZANidine (ZANAFLEX) 4 MG tablet TAKE 1 TABLET BY MOUTH EVERYDAY AT BEDTIME (Patient taking differently: Take 4 mg by mouth at bedtime. TAKE 1 TABLET BY MOUTH EVERYDAY AT BEDTIME) 90 tablet 1   No current facility-administered medications on file prior to visit.    There are no Patient Instructions on file for this visit. No follow-ups on file.   Kris Hartmann, NP

## 2022-08-07 ENCOUNTER — Other Ambulatory Visit: Payer: Self-pay | Admitting: Physician Assistant

## 2022-08-07 ENCOUNTER — Other Ambulatory Visit: Payer: Self-pay | Admitting: Urology

## 2022-08-07 DIAGNOSIS — N200 Calculus of kidney: Secondary | ICD-10-CM

## 2022-08-09 ENCOUNTER — Encounter: Payer: Self-pay | Admitting: Internal Medicine

## 2022-08-09 DIAGNOSIS — G4733 Obstructive sleep apnea (adult) (pediatric): Secondary | ICD-10-CM

## 2022-08-13 NOTE — Progress Notes (Unsigned)
I,Sha'taria Tyson,acting as a Education administrator for [employer], PA-C.,have documented all relevant documentation on the behalf of [name], PA-C,as directed by  [name], PA-C while in the presence of [name], PA-C.   Established patient visit   Patient: Manuel Stafford.   DOB: 28-Jul-1945   77 y.o. Male  MRN: [id number] Visit Date: 08/14/2022  Today's healthcare provider: [name], PA-C   No chief complaint on file.  Subjective    HPI  Diabetes Mellitus Type II, Follow-up  Lab Results  Component Value Date   HGBA1C 5.5 04/11/2022   HGBA1C 6.0 (H) 11/07/2021   HGBA1C 6.3 (H) 07/27/2021   Wt Readings from Last 3 Encounters:  07/18/22 232 lb (105.2 kg)  06/26/22 232 lb 12.8 oz (105.6 kg)  06/12/22 229 lb 12.8 oz (104.2 kg)   Last seen for diabetes 4 months ago.  Management since then includes recommend d/c actos, pt agreeable. Continue with with metformin 1000 mg bid, and ozempic He reports {excellent/good/fair/poor:19665} compliance with treatment. He {is/is not:21021397} having side effects. {document side effects if present:1} Symptoms: {Yes/No:20286} fatigue {Yes/No:20286} foot ulcerations  {Yes/No:20286} appetite changes {Yes/No:20286} nausea  {Yes/No:20286} paresthesia of the feet  {Yes/No:20286} polydipsia  {Yes/No:20286} polyuria {Yes/No:20286} visual disturbances   {Yes/No:20286} vomiting     Home blood sugar records: {diabetes glucometry results:16657}  Episodes of hypoglycemia? {Yes/No:20286} {enter symptoms and frequency of symptoms if yes:1}   Current insulin regiment: {enter 'none' or type of insulin and number of units taken with each dose of each insulin formulation that the patient is taking:1} Most Recent Eye Exam: Overdue since 07/05/22 Pertinent Labs: Lab Results  Component Value Date   CHOL 107 11/07/2021   HDL 28 (L) 11/07/2021   LDLCALC 53 11/07/2021   TRIG 149 11/07/2021   CHOLHDL 3.8 11/07/2021   Lab Results   Component Value Date   NA 137 03/26/2022   K 3.9 03/26/2022   CREATININE 1.38 (H) 03/26/2022   GFRNONAA 53 (L) 03/26/2022   LABMICR 14.4 04/11/2022   MICRALBCREAT 12 04/11/2022     ---------------------------------------------------------------------------------------------------   Medications: Outpatient Medications Prior to Visit  Medication Sig   aspirin 81 MG tablet Take 81 mg by mouth daily.    Azelastine HCl 137 MCG/SPRAY SOLN PLACE 1 SPRAY INTO BOTH NOSTRILS 2 (TWO) TIMES DAILY. USE IN EACH NOSTRIL AS DIRECTED   Cholecalciferol (VITAMIN D3) 2000 UNITS capsule Take 2,000 Units by mouth daily.    Continuous Blood Gluc Sensor (FREESTYLE LIBRE 2 SENSOR) MISC USE AS DIRECTED. CONTINUOUS BLOOD SENSOR.   hydrochlorothiazide (HYDRODIURIL) 25 MG tablet TAKE 1 TABLET BY MOUTH EVERY DAY   ketoconazole (NIZORAL) 2 % cream Apply 1 Application topically at bedtime.   meclizine (ANTIVERT) 25 MG tablet Take 1 tablet (25 mg total) by mouth 3 (three) times daily as needed for dizziness.   metFORMIN (GLUCOPHAGE) 1000 MG tablet TAKE 1 TABLET (1,000 MG TOTAL) BY MOUTH 2 (TWO) TIMES DAILY WITH A MEAL.   MULTIPLE VITAMIN PO Take 1 tablet by mouth daily.   OZEMPIC, 0.25 OR 0.5 MG/DOSE, 2 MG/3ML SOPN Please specify directions, refills and quantity (Patient taking differently: Inject 0.5 mg as directed once a week. On Thursday's)   pantoprazole (PROTONIX) 40 MG tablet TAKE 1 TABLET BY MOUTH EVERY DAY   rosuvastatin (CRESTOR) 20 MG tablet TAKE 1 TABLET BY MOUTH EVERY DAY   Saw Palmetto, Serenoa repens, (SAW PALMETTO BERRY) 160 MG CAPS Take 2 tablets by mouth daily.    tadalafil (  CIALIS) 5 MG tablet Take 1 tablet (5 mg total) by mouth daily.   tamsulosin (FLOMAX) 0.4 MG CAPS capsule TAKE 1 CAPSULE BY MOUTH EVERY DAY   telmisartan (MICARDIS) 40 MG tablet TAKE 1 TABLET BY MOUTH EVERY DAY (Patient taking differently: Take 40 mg by mouth every morning.)   tiZANidine (ZANAFLEX) 4 MG tablet TAKE 1 TABLET BY  MOUTH EVERYDAY AT BEDTIME (Patient taking differently: Take 4 mg by mouth at bedtime. TAKE 1 TABLET BY MOUTH EVERYDAY AT BEDTIME)   No facility-administered medications prior to visit.    Review of Systems  {Labs  Heme  Chem  Endocrine  Serology  Results Review (optional):23779}   Objective    There were no vitals taken for this visit. {Show previous vital signs (optional):23777}  Physical Exam  ***  No results found for any visits on 08/14/22.  Assessment & Plan     ***  No follow-ups on file.      {provider attestation***:1}   [name], PA-C  [place] [phone] (phone) [phone] (fax)  [place]

## 2022-08-14 ENCOUNTER — Ambulatory Visit (INDEPENDENT_AMBULATORY_CARE_PROVIDER_SITE_OTHER): Payer: PPO | Admitting: Physician Assistant

## 2022-08-14 DIAGNOSIS — Z5321 Procedure and treatment not carried out due to patient leaving prior to being seen by health care provider: Secondary | ICD-10-CM

## 2022-08-15 NOTE — Progress Notes (Signed)
Pt left before being seen

## 2022-08-23 NOTE — Procedures (Signed)
[place] Report Part I  Phone: [phone] Fax: [phone]  Patient Name: Manuel Stafford, [facility] R. Acquisition Number: [id number]  Date of Birth: Nov 22, 1945 Acquisition Date: 08/09/2022  Referring Physician: [name] PA-C     History: The patient is a 77 year old  . Medical History: OSA, diabetes, obesity, hypertension.  Medications: aspirin, hydrochlorothiazide, ketoconazole, meclizine, vitamin D, ozempic, pantoprazole, ropinrole, rosuvastatin, tadalafil, telmisartan, tizanidine.  Procedure: This routine overnight polysomnogram was performed on the [place] 5 using the standard BIPAP SV protocol. This included 6 channels of EEG, 2 channels of EOG, chin EMG, bilateral anterior tibialis EMG, nasal/oral thermistor, PTAF (nasal pressure transducer), chest and abdominal wall movements, EKG, and pulse oximetry.  Description: The total recording time was 463.0 minutes. The total sleep time was 379.0 minutes. There were a total of 80.5 minutes of wakefulness after sleep onset for a reducedsleep efficiency of 81.9%. The latency to sleep onset was shortat 3.5 minutes. The R sleep onset latency was within normal limits at 73.5 minutes. Sleep parameters, as a percentage of the total sleep time, demonstrated 5.5% of sleep was in N1 sleep, 77.4% N2, 0.0% N3 and 17.0% R sleep. There were a total of 13 arousals for an arousal index of 2.1 arousals per hour of sleep that was normal.  The patient did well at all pressures. There were no respiratory events during the pressure titration.  was initiated at the following settings at lights out, 9:40 p.m.: 15.0 - MAX EPAP, 8.0 MIN EPAP, 15.0 MAX pressure support , 3.0 MIN pressure support, rate auto. The pressure remained at this setting throughout the study.  Additionally, the baseline oxygen saturation during wakefulness was 94%, during NREM sleep averaged 92%, and during REM sleep averaged 92%. The total duration of oxygen < 90% was  11.4 minutes and <80% was 0.2 minutes.  Cardiac monitoring- Intermittent complete heart block was observed throughout the study.   Periodic limb movement monitoring- demonstrated that there were 23 periodic limb movements for a periodic limb movement index of 3.6 periodic limb movements per hour of sleep.   Impression: This patient's obstructive sleep apnea demonstrated significant improvement with the utilization of nasal BiPAP SV.   There were few periodic limb movements that commonly are not significant. Clinical correlation would be suggested.   Limited EKG monitoring demonstrated intermittent, complete heart block throughout the study. Cardiac evaluation is suggested if this is a new finding.  Recommendations: Would recommend utilization of nasal BiPAP SV at the following settings: 15.0 - MAX EPAP, 8.0 MIN EPAP, 15.0 MAX pressure support , 3.0 MIN pressure support, rate auto.  An AirFit N20 mask, size Medium, was used. Chin strap used during study- Yes. Humidifier used during study- Yes.     Allyne Gee, MD, [facility] Diplomate ABMS-Pulmonary, Critical Care and Sleep Medicine  Electronically reviewed and digitally signed  [facility] CPAP/BIPAP Polysomnogram Report Part II Phone: [phone] Fax: [phone]  Patient last name Manuel Stafford Neck Size 16.0 in. Acquisition [phone]  Patient first name Manuel R. Weight 220.0 lbs. Started 08/09/2022 at 9:33:48 PM  Birth date April 04, 1946 Height 72.0 in. Stopped 08/10/2022 at 5:26:06 AM  Age 16      Type Adult BMI 29.8 lb/in2 Duration 463.0  Christophe Louis - RPSGT, Laurina Bustle Reviewed by: Richelle Ito. Henke, PhD, ABSM, FAASM Sleep Data: Lights Out: 9:40:48 PM Sleep Onset: 9:44:18 PM  Lights On: 5:23:48 AM Sleep Efficiency: 81.9 %  Total Recording Time: 463.0 min Sleep  Latency (from Lights Off) 3.5 min  Total Sleep Time (TST): 379.0 min R Latency (from Sleep Onset): 73.5 min  Sleep Period Time: 459.0 min Total number of  awakenings: 11  Wake during sleep: 80.0 min Wake After Sleep Onset (WASO): 80.5 min   Sleep Data:         Arousal Summary: Stage  Latency from lights out (min) Latency from sleep onset (min) Duration (min) % Total Sleep Time  Normal values  N 1 3.5 0.0 21.0 5.5 (5%)  N 2 4.5 1.0 293.5 77.4 (50%)  N 3       0.0 0.0 (20%)  R 77.0 73.5 64.5 17.0 (25%)   Number Index  Spontaneous 13 2.1  Apneas & Hypopneas 0 0.0  RERAs 0 0.0       (Apneas & Hypopneas & RERAs)  (0) (0.0)  Limb Movement 0 0.0  Snore 0 0.0  TOTAL 13 2.1     Respiratory Data:  CA OA MA Apnea Hypopnea* A+ H RERA Total  Number 0 0 0 0 0 0 0 0  Mean Dur (sec) 0.0 0.0 0.0 0.0 0.0 0.0 0.0 0.0  Max Dur (sec) 0.0 0.0 0.0 0.0 0.0 0.0 0.0 0.0  Total Dur (min) 0.0 0.0 0.0 0.0 0.0 0.0 0.0 0.0  % of TST 0.0 0.0 0.0 0.0 0.0 0.0 0.0 0.0  Index (#/h TST) 0.0 0.0 0.0 0.0 0.0 0.0 0.0 0.0  *Hypopneas scored based on 4% or greater desaturation.  Sleep Stage:         REM NREM TST  AHI 0.0 0.0 0.0  RDI 0.0 0.0 0.0   Sleep (min) TST (%) REM (min) NREM (min) CA (#) OA (#) MA (#) HYP (#) AHI (#/h) RERA (#) RDI (#/h) Desat (#)  Supine 379.0 100.00 64.5 314.5 0 0 0 0 0.0 0 0.00 82  Non-Supine 0.00 0.00 0.00 0.00 0.00 0.00 0.00 0.00 0.00 0 0.00 0.00  Right: 0.0 0.00 0.0 0.0 0 0 0 0 0.0 0 0.00 0  UP: 0.0 0.00 0.0 0.0 0 0 0 0 0.0 0 0.00 0     Snoring: Total number of snoring episodes  0  Total time with snoring    min (   % of sleep)   Oximetry Distribution:             WK REM NREM TOTAL  Average (%)   94 92 92 93  < 90% 0.2 1.5 9.7 11.4  < 80% 0.2 0.0 0.0 0.2  < 70% 0.2 0.0 0.0 0.2  # of Desaturations* 0 1 81 82  Desat Index (#/hour) 0.0 0.9 15.5 13.0  Desat Max (%) 0 '3 9 9  '$ Desat Max Dur (sec) 0.0 32.0 111.0 111.0  Approx Min O2 during sleep 86  Approx min O2 during a respiratory event     Was Oxygen added (Y/N) and final rate :    LPM  *Desaturations based on 3% or greater drop from baseline.   Cheyne  Stokes Breathing: None Present    Heart Rate Summary:  Average Heart Rate During Sleep 66.2 bpm      Highest Heart Rate During Sleep (95th %) 71.0 bpm      Highest Heart Rate During Sleep 199 bpm (artifact)  Highest Heart Rate During Recording (TIB) 199 bpm (artifact)   Heart Rate Observations: Event Type # Events   Bradycardia 0 Lowest HR Scored: N/A  Sinus Tachycardia During Sleep 0 Highest HR Scored: N/A  Narrow  Complex Tachycardia 0 Highest HR Scored: N/A  Wide Complex Tachycardia 0 Highest HR Scored: N/A  Asystole 0 Longest Pause: N/A  Atrial Fibrillation 0 Duration Longest Event: N/A  Other Arrythmias   Type:   Periodic Limb Movement Data: (Primary legs unless otherwise noted) Total # Limb Movement 25 Limb Movement Index 4.0  Total # PLMS 23 PLMS Index 3.6  Total # PLMS Arousals    PLMS Arousal Index     Percentage Sleep Time with PLMS 13.24mn (3.5 % sleep)  Mean Duration limb movements (secs) 401.0    IPAP Level (cmH2O) EPAP Level (cmH2O) Total Duration (min) Sleep Duration (min) Sleep (%) REM (%) CA  #) OA # MA # HYP #) AHI (#/hr) RERAs # RERAs (#/hr) RDI (#/hr)  9 6 10.7 0.0 0.0 0.0 0 0 0 0 0.0 0 0.0 0.0  9 7 9.5 0.0 0.0 0.0 0 0 0 0 0.0 0 0.0 0.0  9 8 6.2 0.0 0.0 0.0 0 0 0 0 0.0 0 0.0 0.0  10 6 14.0 14.0 100.0 0.0 0 0 0 0 0.0 0 0.0 0.0  10 7 92.8 79.1 85.2 11.6 0 0 0 0 0.0 0 0.0 0.0  10 8 18.7 11.9 63.6 9.1 0 0 0 0 0.0 0 0.0 0.0  11 7 30.2 28.7 95.0 0.0 0 0 0 0 0.0 0 0.0 0.0  11 8 8.1 8.1 100.0 61.7 0 0 0 0 0.0 0 0.0 0.0  14 9 13.4 11.7 87.3 0.0 0 0 0 0 0.0 0 0.0 0.0  14 10 21.4 15.9 74.3 0.0 0 0 0 0 0.0 0 0.0 0.0  14 11 22.3 21.8 97.8 18.8 0 0 0 0 0.0 0 0.0 0.0  15 11 4.7 4.7 100.0 0.0 0 0 0 0 0.0 0 0.0 0.0  17 10 6.8 6.8 100.0 0.0 0 0 0 0 0.0 0 0.0 0.0  17 11 10.0 7.0 70.0 0.0 0 0 0 0 0.0 0 0.0 0.0  17 12 7.9 7.9 100.0 0.0 0 0 0 0 0.0 0 0.0 0.0  17 13 11.7 11.7 100.0 0.0 0 0 0 0 0.0 0 0.0 0.0  23 7 29.5 29.5 100.0 0.0 0 0 0 0 0.0 0 0.0 0.0  23 8 55.3 51.6  93.3 0.0 0 0 0 0 0.0 0 0.0 0.0  24 8 6.3 6.3 100.0 79.4 0 0 0 0 0.0 0 0.0 0.0  26 10 9.7 9.7 100.0 78.4 0 0 0 0 0.0 0 0.0 0.0  26 11 23.0 22.0 95.7 63.0 0 0 0 0 0.0 0 0.0 0.0

## 2022-08-29 ENCOUNTER — Ambulatory Visit: Payer: HMO | Admitting: Urology

## 2022-08-31 ENCOUNTER — Telehealth: Payer: Self-pay | Admitting: Physician Assistant

## 2022-08-31 DIAGNOSIS — E782 Mixed hyperlipidemia: Secondary | ICD-10-CM | POA: Diagnosis not present

## 2022-08-31 DIAGNOSIS — E1122 Type 2 diabetes mellitus with diabetic chronic kidney disease: Secondary | ICD-10-CM | POA: Diagnosis not present

## 2022-08-31 DIAGNOSIS — I2510 Atherosclerotic heart disease of native coronary artery without angina pectoris: Secondary | ICD-10-CM | POA: Diagnosis not present

## 2022-08-31 DIAGNOSIS — N5203 Combined arterial insufficiency and corporo-venous occlusive erectile dysfunction: Secondary | ICD-10-CM | POA: Diagnosis not present

## 2022-08-31 DIAGNOSIS — K760 Fatty (change of) liver, not elsewhere classified: Secondary | ICD-10-CM | POA: Diagnosis not present

## 2022-08-31 DIAGNOSIS — I25118 Atherosclerotic heart disease of native coronary artery with other forms of angina pectoris: Secondary | ICD-10-CM | POA: Diagnosis not present

## 2022-08-31 DIAGNOSIS — M2578 Osteophyte, vertebrae: Secondary | ICD-10-CM | POA: Diagnosis not present

## 2022-08-31 DIAGNOSIS — I10 Essential (primary) hypertension: Secondary | ICD-10-CM | POA: Diagnosis not present

## 2022-08-31 DIAGNOSIS — J690 Pneumonitis due to inhalation of food and vomit: Secondary | ICD-10-CM | POA: Diagnosis not present

## 2022-08-31 NOTE — Telephone Encounter (Signed)
[facility] pharmacy requesting prior authorization  [employer]: [id number] Name: Blatz Tadalafil '5mg'$  tablets

## 2022-09-04 ENCOUNTER — Telehealth: Payer: Self-pay | Admitting: Physician Assistant

## 2022-09-04 NOTE — Telephone Encounter (Signed)
[employer] prior authorization follow up [employer]: [id number] Name: Manuel Stafford '5mg'$  tablets

## 2022-09-04 NOTE — Telephone Encounter (Signed)
Status: PA Request Created: March 8th, 2024 [phone] Sent: March 12th, 2024

## 2022-09-04 NOTE — Telephone Encounter (Signed)
Duplicate encounter

## 2022-09-20 DIAGNOSIS — E1122 Type 2 diabetes mellitus with diabetic chronic kidney disease: Secondary | ICD-10-CM | POA: Diagnosis not present

## 2022-09-20 DIAGNOSIS — N132 Hydronephrosis with renal and ureteral calculous obstruction: Secondary | ICD-10-CM | POA: Diagnosis not present

## 2022-09-20 DIAGNOSIS — R0781 Pleurodynia: Secondary | ICD-10-CM | POA: Diagnosis not present

## 2022-09-20 DIAGNOSIS — J690 Pneumonitis due to inhalation of food and vomit: Secondary | ICD-10-CM | POA: Diagnosis not present

## 2022-09-20 DIAGNOSIS — I2510 Atherosclerotic heart disease of native coronary artery without angina pectoris: Secondary | ICD-10-CM | POA: Diagnosis not present

## 2022-09-20 DIAGNOSIS — R0789 Other chest pain: Secondary | ICD-10-CM | POA: Diagnosis not present

## 2022-09-20 DIAGNOSIS — E782 Mixed hyperlipidemia: Secondary | ICD-10-CM | POA: Diagnosis not present

## 2022-09-20 DIAGNOSIS — I10 Essential (primary) hypertension: Secondary | ICD-10-CM | POA: Diagnosis not present

## 2022-09-20 DIAGNOSIS — M2578 Osteophyte, vertebrae: Secondary | ICD-10-CM | POA: Diagnosis not present

## 2022-09-20 DIAGNOSIS — N1831 Chronic kidney disease, stage 3a: Secondary | ICD-10-CM | POA: Diagnosis not present

## 2022-09-20 DIAGNOSIS — I700 Atherosclerosis of aorta: Secondary | ICD-10-CM | POA: Diagnosis not present

## 2022-09-20 DIAGNOSIS — G4733 Obstructive sleep apnea (adult) (pediatric): Secondary | ICD-10-CM | POA: Diagnosis not present

## 2022-10-01 DIAGNOSIS — G4731 Primary central sleep apnea: Secondary | ICD-10-CM | POA: Diagnosis not present

## 2022-10-02 DIAGNOSIS — G4733 Obstructive sleep apnea (adult) (pediatric): Secondary | ICD-10-CM | POA: Diagnosis not present

## 2022-10-04 ENCOUNTER — Other Ambulatory Visit: Payer: Self-pay | Admitting: Physician Assistant

## 2022-10-12 IMAGING — CT CT ABD-PELV W/ CM
2 of 5 series · 15 of 46 positions shown, 17 images · IV contrast (APPLIED)
Comparison: CT the abdomen and pelvis 10/11/2017.

CLINICAL DATA: 75-year-old male with history of flank pain. Chronic
abdominal pain for 1 year, worsening over the past hour.

EXAM:
CT ABDOMEN AND PELVIS WITH CONTRAST
TECHNIQUE: Multidetector CT imaging of the abdomen and pelvis was performed
using the standard protocol following bolus administration of
intravenous contrast.
CONTRAST:  75mL OMNIPAQUE IOHEXOL 350 MG/ML SOLN

[Series 2: routine abd/pel with · axial · 0.95mm/px · z∈[-1044,-534]mm · 12 of 114 slices shown, 14 images]
[im 6/114  soft-tissue]
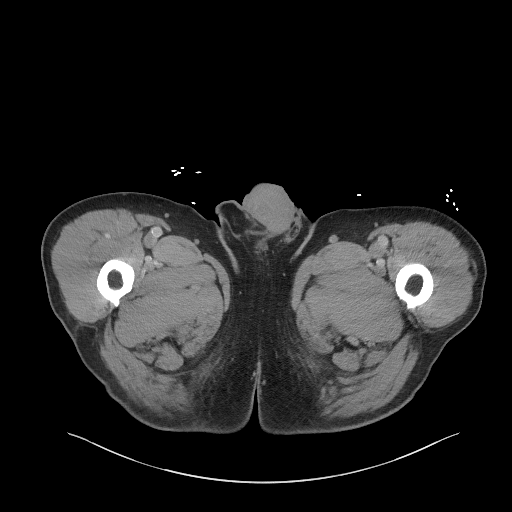
[im 6/114  bone]
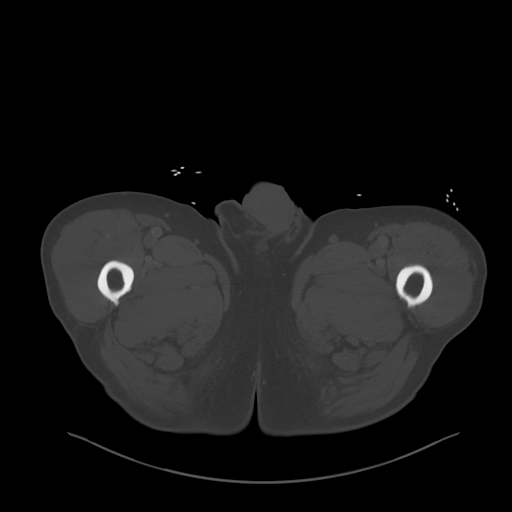
[im 17/114  soft-tissue]
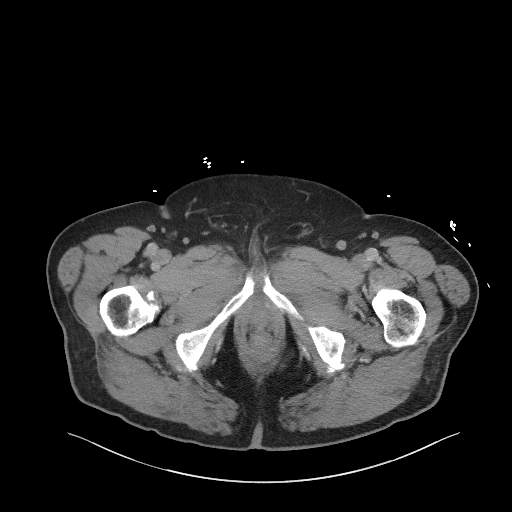
[im 23/114  soft-tissue]
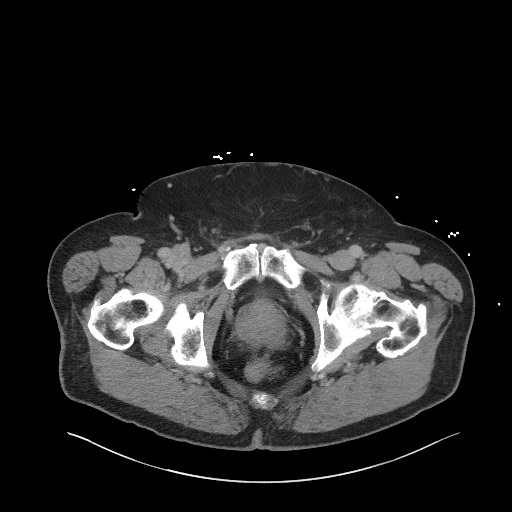
[im 34/114  soft-tissue]
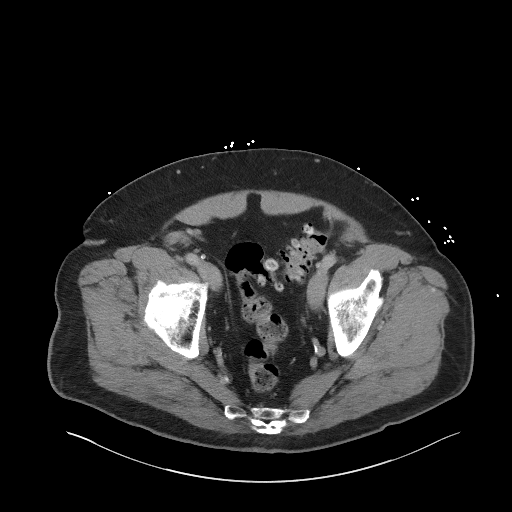
[im 46/114  soft-tissue]
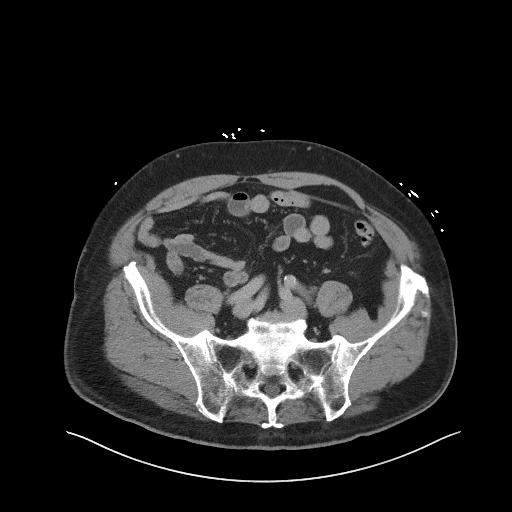
[im 51/114  soft-tissue]
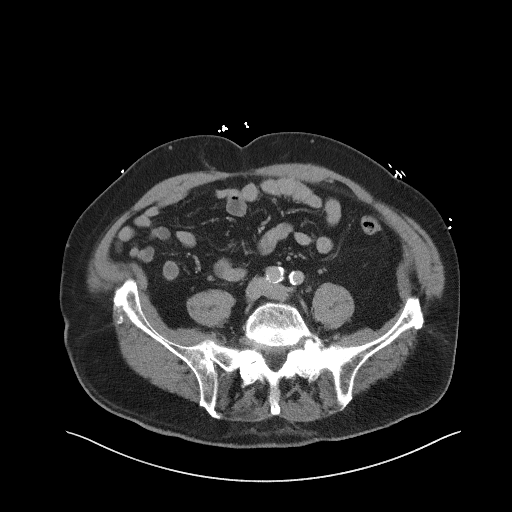
[im 63/114  soft-tissue]
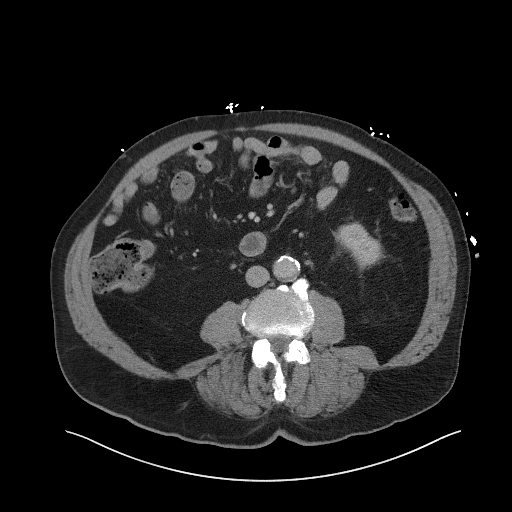
[im 68/114  soft-tissue]
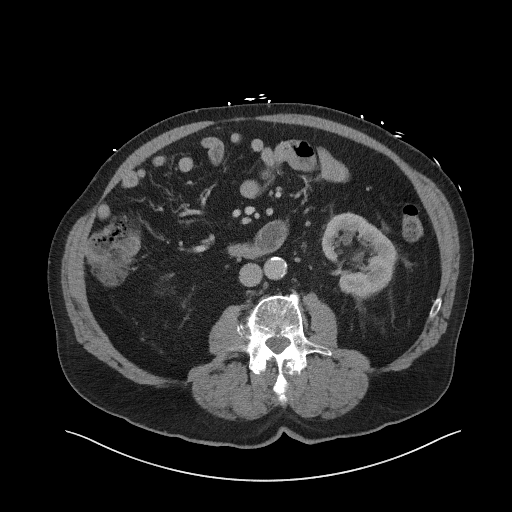
[im 80/114  soft-tissue]
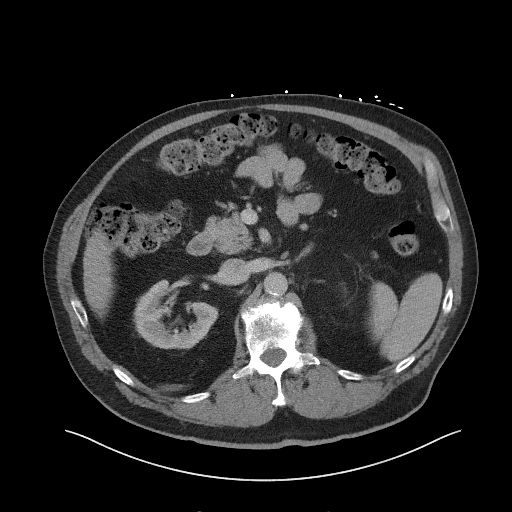
[im 80/114  bone]
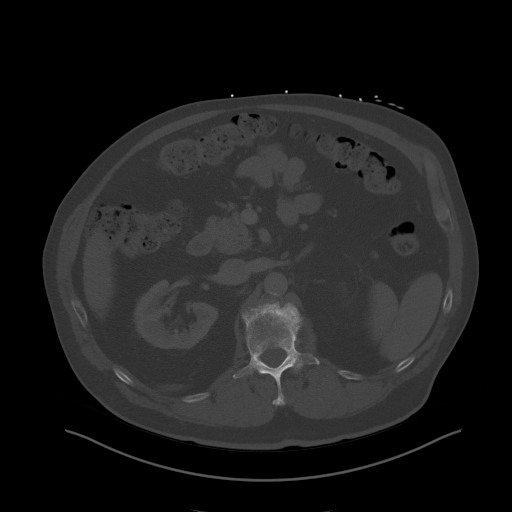
[im 91/114  soft-tissue]
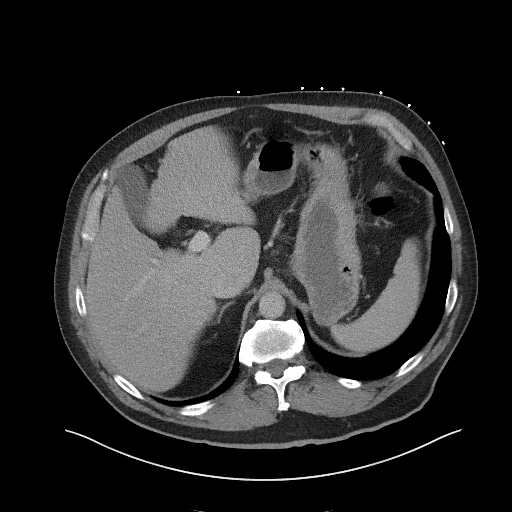
[im 97/114  soft-tissue]
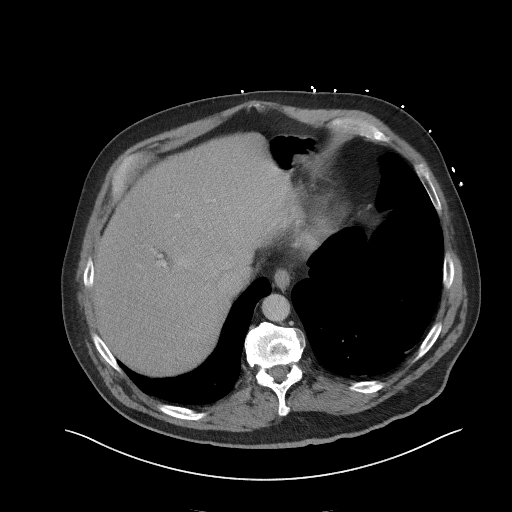
[im 108/114  soft-tissue]
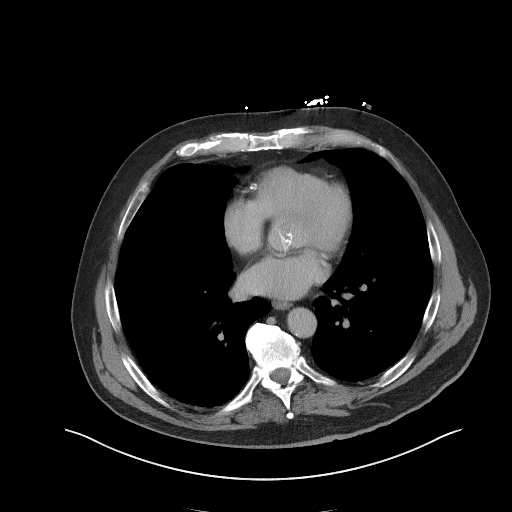

[Series 4: coronal st · coronal · 0.92mm/px · 3 of 115 slices shown]
[im 39/115  soft-tissue]
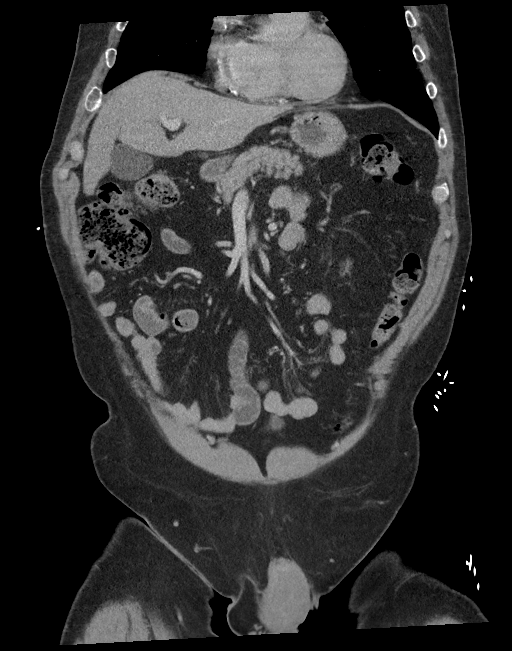
[im 51/115  soft-tissue]
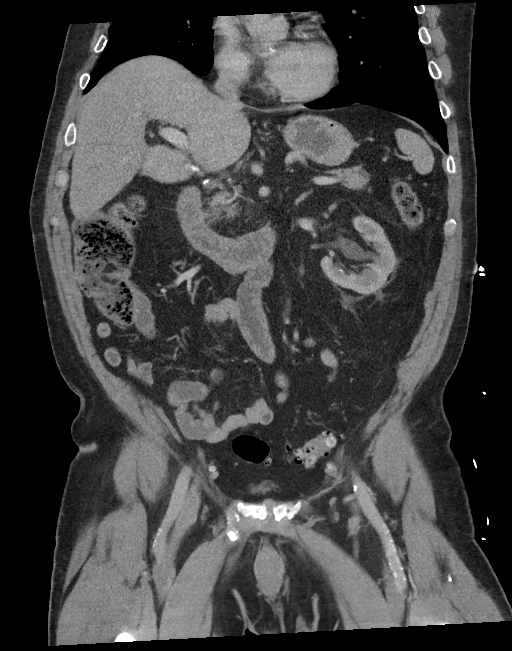
[im 64/115  soft-tissue]
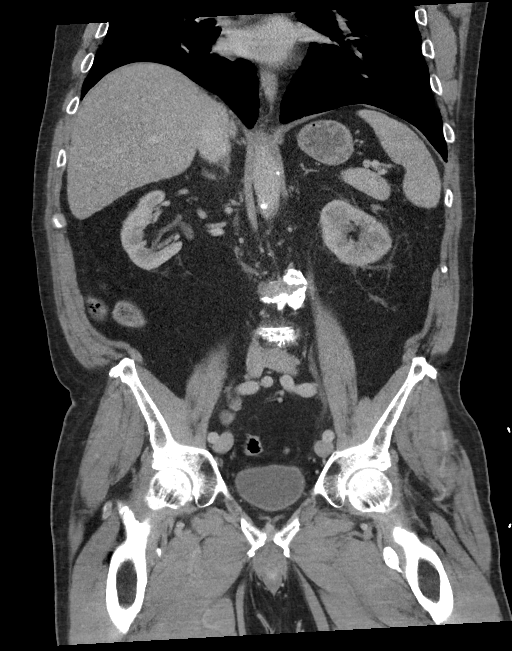

[15 of 46 positions shown; findings below may reference images not displayed]

FINDINGS: Lower chest: 5 mm right middle lobe pulmonary nodule (axial image 2
of series 6), stable dating back to 08/23/2015, considered
definitively benign. Atherosclerotic calcifications in the
descending thoracic aorta as well as the left anterior descending
and right coronary arteries. Severe calcifications of the aortic
valve.

Hepatobiliary: No suspicious cystic or solid hepatic lesions. No
intra or extrahepatic biliary ductal dilatation. Gallbladder is
normal in appearance.

Pancreas: No pancreatic mass. No pancreatic ductal dilatation. No
pancreatic or peripancreatic fluid collections or inflammatory
changes.

Spleen: Unremarkable.

Adrenals/Urinary Tract: 3 mm nonobstructive calculus in the lower
pole collecting system of the left kidney. 2 mm calculus at or
immediately beyond the left ureterovesicular junction (axial image
87 of series 2) along the left posterolateral wall of the urinary
bladder. Small amount of left perinephric soft tissue stranding.
Fullness in the left renal collecting system. Bilateral kidneys and
adrenal glands are otherwise normal in appearance. Urinary bladder
is otherwise normal in appearance.

Stomach/Bowel: The appearance of the stomach is normal. There is no
pathologic dilatation of small bowel or colon. Numerous colonic
diverticulae are noted, particularly in the sigmoid colon, without
surrounding inflammatory changes to suggest an acute diverticulitis
at this time. Normal appendix.

Vascular/Lymphatic: Aortic atherosclerosis, without evidence of
aneurysm or dissection in the abdominal or pelvic vasculature. No
lymphadenopathy noted in the abdomen or pelvis.

Reproductive: Prostate gland and seminal vesicles are unremarkable
in appearance.

Other: No significant volume of ascites.  No pneumoperitoneum.

Musculoskeletal: There are no aggressive appearing lytic or blastic
lesions noted in the visualized portions of the skeleton.
IMPRESSION: 1. 2 mm calculus along the posterolateral wall of the urinary
bladder at or immediately beyond the left ureterovesicular junction.
Very mild fullness of the left renal collecting system and left
perinephric soft tissue stranding. This likely represents a recently
passed calculus.
2. 3 mm nonobstructive calculus also noted in the lower pole
collecting system of left kidney.
3. Colonic diverticulosis without evidence of acute diverticulitis
at this time.
4. Aortic atherosclerosis, in addition to least 2 vessel coronary
artery disease. Assessment for potential risk factor modification,
dietary therapy or pharmacologic therapy may be warranted, if
clinically indicated.
5. There are calcifications of the aortic valve. Echocardiographic
correlation for evaluation of potential valvular dysfunction may be
warranted if clinically indicated.

## 2022-10-18 ENCOUNTER — Telehealth: Payer: Self-pay | Admitting: Physician Assistant

## 2022-10-18 NOTE — Telephone Encounter (Signed)
Copied from CRM [id number]. Topic: Medicare AWV >> Oct 18, 2022  9:40 AM Rushie Goltz wrote: Reason for CRM: Called patient to schedule Medicare Annual Wellness Visit (AWV). Unable to reach patient. Phone number wouldn't go through for home and cell number.  Last date of AWV: 11/30/2021  Please schedule an AWVS appointment at any time with [facility] ANNUAL WELLNESS VISIT.  If any questions, please contact me at [phone].    Thank you,  [facility] Support [facility] Direct dial  [phone]

## 2022-10-30 DIAGNOSIS — M1711 Unilateral primary osteoarthritis, right knee: Secondary | ICD-10-CM | POA: Diagnosis not present

## 2022-10-30 DIAGNOSIS — M25561 Pain in right knee: Secondary | ICD-10-CM | POA: Diagnosis not present

## 2022-10-30 DIAGNOSIS — I10 Essential (primary) hypertension: Secondary | ICD-10-CM | POA: Diagnosis not present

## 2022-10-30 DIAGNOSIS — N1831 Chronic kidney disease, stage 3a: Secondary | ICD-10-CM | POA: Diagnosis not present

## 2022-10-30 DIAGNOSIS — E1122 Type 2 diabetes mellitus with diabetic chronic kidney disease: Secondary | ICD-10-CM | POA: Diagnosis not present

## 2022-10-30 DIAGNOSIS — I2510 Atherosclerotic heart disease of native coronary artery without angina pectoris: Secondary | ICD-10-CM | POA: Diagnosis not present

## 2022-10-31 DIAGNOSIS — G4731 Primary central sleep apnea: Secondary | ICD-10-CM | POA: Diagnosis not present

## 2022-11-05 ENCOUNTER — Other Ambulatory Visit: Payer: Self-pay | Admitting: Physician Assistant

## 2022-11-15 DIAGNOSIS — E1122 Type 2 diabetes mellitus with diabetic chronic kidney disease: Secondary | ICD-10-CM | POA: Diagnosis not present

## 2022-11-15 DIAGNOSIS — R350 Frequency of micturition: Secondary | ICD-10-CM | POA: Diagnosis not present

## 2022-11-15 DIAGNOSIS — M25561 Pain in right knee: Secondary | ICD-10-CM | POA: Diagnosis not present

## 2022-11-15 DIAGNOSIS — N1831 Chronic kidney disease, stage 3a: Secondary | ICD-10-CM | POA: Diagnosis not present

## 2022-11-15 DIAGNOSIS — N410 Acute prostatitis: Secondary | ICD-10-CM | POA: Diagnosis not present

## 2022-11-21 DIAGNOSIS — N200 Calculus of kidney: Secondary | ICD-10-CM | POA: Diagnosis not present

## 2022-11-21 DIAGNOSIS — K219 Gastro-esophageal reflux disease without esophagitis: Secondary | ICD-10-CM | POA: Diagnosis not present

## 2022-11-21 DIAGNOSIS — E1122 Type 2 diabetes mellitus with diabetic chronic kidney disease: Secondary | ICD-10-CM | POA: Diagnosis not present

## 2022-11-21 DIAGNOSIS — E785 Hyperlipidemia, unspecified: Secondary | ICD-10-CM | POA: Diagnosis not present

## 2022-11-21 DIAGNOSIS — R809 Proteinuria, unspecified: Secondary | ICD-10-CM | POA: Diagnosis not present

## 2022-11-21 DIAGNOSIS — K759 Inflammatory liver disease, unspecified: Secondary | ICD-10-CM | POA: Diagnosis not present

## 2022-11-21 DIAGNOSIS — I10 Essential (primary) hypertension: Secondary | ICD-10-CM | POA: Diagnosis not present

## 2022-11-21 DIAGNOSIS — G4733 Obstructive sleep apnea (adult) (pediatric): Secondary | ICD-10-CM | POA: Diagnosis not present

## 2022-11-21 DIAGNOSIS — R8290 Unspecified abnormal findings in urine: Secondary | ICD-10-CM | POA: Diagnosis not present

## 2022-11-21 DIAGNOSIS — R3129 Other microscopic hematuria: Secondary | ICD-10-CM | POA: Diagnosis not present

## 2022-11-21 DIAGNOSIS — N1339 Other hydronephrosis: Secondary | ICD-10-CM | POA: Diagnosis not present

## 2022-11-21 DIAGNOSIS — E668 Other obesity: Secondary | ICD-10-CM | POA: Diagnosis not present

## 2022-11-22 ENCOUNTER — Other Ambulatory Visit: Payer: Self-pay | Admitting: Nephrology

## 2022-11-22 DIAGNOSIS — E1122 Type 2 diabetes mellitus with diabetic chronic kidney disease: Secondary | ICD-10-CM | POA: Diagnosis not present

## 2022-11-22 DIAGNOSIS — N200 Calculus of kidney: Secondary | ICD-10-CM

## 2022-11-22 DIAGNOSIS — N1832 Chronic kidney disease, stage 3b: Secondary | ICD-10-CM

## 2022-11-22 DIAGNOSIS — M2391 Unspecified internal derangement of right knee: Secondary | ICD-10-CM | POA: Diagnosis not present

## 2022-11-22 DIAGNOSIS — M1711 Unilateral primary osteoarthritis, right knee: Secondary | ICD-10-CM | POA: Diagnosis not present

## 2022-11-22 DIAGNOSIS — R8290 Unspecified abnormal findings in urine: Secondary | ICD-10-CM

## 2022-11-22 DIAGNOSIS — N1831 Chronic kidney disease, stage 3a: Secondary | ICD-10-CM | POA: Diagnosis not present

## 2022-11-22 DIAGNOSIS — M25461 Effusion, right knee: Secondary | ICD-10-CM | POA: Diagnosis not present

## 2022-11-26 ENCOUNTER — Other Ambulatory Visit: Payer: Self-pay | Admitting: Orthopedic Surgery

## 2022-11-26 DIAGNOSIS — M2391 Unspecified internal derangement of right knee: Secondary | ICD-10-CM

## 2022-11-26 DIAGNOSIS — M1711 Unilateral primary osteoarthritis, right knee: Secondary | ICD-10-CM

## 2022-11-26 DIAGNOSIS — M25461 Effusion, right knee: Secondary | ICD-10-CM

## 2022-12-01 DIAGNOSIS — G4731 Primary central sleep apnea: Secondary | ICD-10-CM | POA: Diagnosis not present

## 2022-12-03 ENCOUNTER — Ambulatory Visit
Admission: RE | Admit: 2022-12-03 | Discharge: 2022-12-03 | Disposition: A | Discharge status: Home / Self Care | Payer: PPO | Source: Ambulatory Visit | Attending: Nephrology | Admitting: Nephrology

## 2022-12-03 ENCOUNTER — Ambulatory Visit
Admission: RE | Admit: 2022-12-03 | Discharge: 2022-12-03 | Disposition: A | Discharge status: Home / Self Care | Payer: PPO | Source: Ambulatory Visit | Attending: Family Medicine | Admitting: Family Medicine

## 2022-12-03 DIAGNOSIS — R1084 Generalized abdominal pain: Secondary | ICD-10-CM | POA: Insufficient documentation

## 2022-12-03 DIAGNOSIS — K5792 Diverticulitis of intestine, part unspecified, without perforation or abscess without bleeding: Secondary | ICD-10-CM | POA: Diagnosis not present

## 2022-12-03 DIAGNOSIS — N1832 Chronic kidney disease, stage 3b: Secondary | ICD-10-CM | POA: Insufficient documentation

## 2022-12-03 DIAGNOSIS — R8290 Unspecified abnormal findings in urine: Secondary | ICD-10-CM | POA: Insufficient documentation

## 2022-12-03 DIAGNOSIS — N200 Calculus of kidney: Secondary | ICD-10-CM | POA: Diagnosis not present

## 2022-12-03 DIAGNOSIS — R1032 Left lower quadrant pain: Secondary | ICD-10-CM | POA: Diagnosis not present

## 2022-12-03 DIAGNOSIS — K5730 Diverticulosis of large intestine without perforation or abscess without bleeding: Secondary | ICD-10-CM | POA: Diagnosis not present

## 2022-12-03 DIAGNOSIS — K8020 Calculus of gallbladder without cholecystitis without obstruction: Secondary | ICD-10-CM | POA: Diagnosis not present

## 2022-12-03 DIAGNOSIS — N189 Chronic kidney disease, unspecified: Secondary | ICD-10-CM | POA: Diagnosis not present

## 2022-12-03 MED ORDER — IOHEXOL 9 MG/ML PO SOLN
1000.0000 mL | ORAL | Status: AC
  Administered 2022-12-03: 500 mL via ORAL

## 2022-12-07 ENCOUNTER — Ambulatory Visit
Admission: RE | Admit: 2022-12-07 | Discharge: 2022-12-07 | Disposition: A | Discharge status: Home / Self Care | Payer: PPO | Source: Ambulatory Visit | Attending: Orthopedic Surgery | Admitting: Orthopedic Surgery

## 2022-12-07 DIAGNOSIS — M1711 Unilateral primary osteoarthritis, right knee: Secondary | ICD-10-CM

## 2022-12-07 DIAGNOSIS — M2391 Unspecified internal derangement of right knee: Secondary | ICD-10-CM

## 2022-12-07 DIAGNOSIS — M25561 Pain in right knee: Secondary | ICD-10-CM | POA: Diagnosis not present

## 2022-12-07 DIAGNOSIS — M25461 Effusion, right knee: Secondary | ICD-10-CM

## 2022-12-07 DIAGNOSIS — M23221 Derangement of posterior horn of medial meniscus due to old tear or injury, right knee: Secondary | ICD-10-CM | POA: Diagnosis not present

## 2022-12-11 DIAGNOSIS — N1831 Chronic kidney disease, stage 3a: Secondary | ICD-10-CM | POA: Diagnosis not present

## 2022-12-11 DIAGNOSIS — I10 Essential (primary) hypertension: Secondary | ICD-10-CM | POA: Diagnosis not present

## 2022-12-11 DIAGNOSIS — I2510 Atherosclerotic heart disease of native coronary artery without angina pectoris: Secondary | ICD-10-CM | POA: Diagnosis not present

## 2022-12-11 DIAGNOSIS — E1122 Type 2 diabetes mellitus with diabetic chronic kidney disease: Secondary | ICD-10-CM | POA: Diagnosis not present

## 2022-12-11 DIAGNOSIS — J690 Pneumonitis due to inhalation of food and vomit: Secondary | ICD-10-CM | POA: Diagnosis not present

## 2022-12-11 DIAGNOSIS — Z0000 Encounter for general adult medical examination without abnormal findings: Secondary | ICD-10-CM | POA: Diagnosis not present

## 2022-12-11 DIAGNOSIS — N5203 Combined arterial insufficiency and corporo-venous occlusive erectile dysfunction: Secondary | ICD-10-CM | POA: Diagnosis not present

## 2022-12-20 DIAGNOSIS — M2391 Unspecified internal derangement of right knee: Secondary | ICD-10-CM | POA: Diagnosis not present

## 2023-01-02 DIAGNOSIS — G4731 Primary central sleep apnea: Secondary | ICD-10-CM | POA: Diagnosis not present

## 2023-01-06 ENCOUNTER — Other Ambulatory Visit: Payer: Self-pay | Admitting: Physician Assistant

## 2023-01-07 DIAGNOSIS — S83241A Other tear of medial meniscus, current injury, right knee, initial encounter: Secondary | ICD-10-CM | POA: Diagnosis not present

## 2023-01-08 ENCOUNTER — Other Ambulatory Visit: Payer: Self-pay | Admitting: Orthopedic Surgery

## 2023-01-09 ENCOUNTER — Other Ambulatory Visit: Payer: Self-pay | Admitting: Physician Assistant

## 2023-01-18 ENCOUNTER — Encounter: Payer: Self-pay | Admitting: Anesthesiology

## 2023-01-18 ENCOUNTER — Encounter: Payer: Self-pay | Admitting: Orthopedic Surgery

## 2023-01-21 ENCOUNTER — Encounter: Payer: Self-pay | Admitting: Orthopedic Surgery

## 2023-01-21 NOTE — Anesthesia Preprocedure Evaluation (Signed)
Anesthesia Evaluation    Airway        Dental   Pulmonary former smoker [facility]  Polysomnogram Report  History: The patient is a 77 year old  . Medical History: OSA, diabetes, obesity, hypertension.     , the baseline oxygen saturation during wakefulness was 94%, during NREM sleep averaged 92%, and during REM sleep averaged 92%. The total duration of oxygen < 90% was 11.4 minutes and <80% was 0.2 minutes.   Cardiac monitoring- Intermittent complete heart block was observed throughout the study.    Periodic limb movement monitoring- demonstrated that there were 23 periodic limb movements for a periodic limb movement index of 3.6 periodic limb movements per hour of sleep.    Impression: This patient's obstructive sleep apnea demonstrated significant improvement with the utilization of nasal BiPAP SV.    There were few periodic limb movements that commonly are not significant. Clinical correlation would be suggested.    Limited EKG monitoring demonstrated intermittent, complete heart block throughout the study. Cardiac evaluation is suggested if this is a new finding.   Recommendations: 1. Would recommend utilization of nasal BiPAP SV at the following settings: 15.0 - MAX EPAP, 8.0 MIN EPAP, 15.0 MAX pressure support , 3.0 MIN pressure support, rate auto.  2. An AirFit N20 mask, size Medium, was used. Chin strap used during study- Yes. Humidifier used during study- Yes.                Cardiovascular hypertension,   Cardiac monitoring- Intermittent complete heart block was observed throughout the study. Feb 2024   Neuro/Psych    GI/Hepatic   Endo/Other  diabetes    Renal/GU      Musculoskeletal   Abdominal   Peds  Hematology   Anesthesia Other Findings Hypertension GERD (gastroesophageal reflux disease) Chronic lower back pain  Hemorrhoids T2DM (type 2 diabetes mellitus) Parkinsonian  features Hyperlipidemia  Fatty liver disease, nonalcoholic DDD (degenerative disc disease), lumbar Lung nodules OSA (obstructive sleep apnea) History of kidney stones Hypertensive left ventricular hypertrophy Recurrent aspiration pneumonia  Coronary artery disease  Tremor (parkinsons) Aortic atherosclerosis (HCC) Anxiety Depression  Erectile dysfunction Lumbar spondylolysis  Cervical osteophyte Osteoarthritis  Stage 3 a CKD Diastolic dysfunction RLS (restless legs syndrome) RBBB (right bundle branch block) Left inguinal hernia     Reproductive/Obstetrics                              Anesthesia Physical Anesthesia Plan Anesthesia Quick Evaluation

## 2023-01-22 ENCOUNTER — Other Ambulatory Visit
Admission: RE | Admit: 2023-01-22 | Discharge: 2023-01-22 | Disposition: A | Discharge status: Home / Self Care | Payer: PPO | Attending: Urgent Care | Admitting: Urgent Care

## 2023-01-22 DIAGNOSIS — Z79899 Other long term (current) drug therapy: Secondary | ICD-10-CM | POA: Diagnosis not present

## 2023-01-22 DIAGNOSIS — Z01812 Encounter for preprocedural laboratory examination: Secondary | ICD-10-CM | POA: Insufficient documentation

## 2023-01-22 LAB — POTASSIUM: Potassium: 4.3 mmol/L (ref 3.5–5.1)

## 2023-01-24 DIAGNOSIS — E668 Other obesity: Secondary | ICD-10-CM | POA: Diagnosis not present

## 2023-01-24 DIAGNOSIS — G4733 Obstructive sleep apnea (adult) (pediatric): Secondary | ICD-10-CM | POA: Diagnosis not present

## 2023-01-24 DIAGNOSIS — N1339 Other hydronephrosis: Secondary | ICD-10-CM | POA: Diagnosis not present

## 2023-01-24 DIAGNOSIS — I10 Essential (primary) hypertension: Secondary | ICD-10-CM | POA: Diagnosis not present

## 2023-01-24 DIAGNOSIS — R3129 Other microscopic hematuria: Secondary | ICD-10-CM | POA: Diagnosis not present

## 2023-01-24 DIAGNOSIS — E785 Hyperlipidemia, unspecified: Secondary | ICD-10-CM | POA: Diagnosis not present

## 2023-01-24 DIAGNOSIS — K219 Gastro-esophageal reflux disease without esophagitis: Secondary | ICD-10-CM | POA: Diagnosis not present

## 2023-01-24 DIAGNOSIS — R809 Proteinuria, unspecified: Secondary | ICD-10-CM | POA: Diagnosis not present

## 2023-01-24 DIAGNOSIS — N200 Calculus of kidney: Secondary | ICD-10-CM | POA: Diagnosis not present

## 2023-01-24 DIAGNOSIS — K759 Inflammatory liver disease, unspecified: Secondary | ICD-10-CM | POA: Diagnosis not present

## 2023-01-24 DIAGNOSIS — R8290 Unspecified abnormal findings in urine: Secondary | ICD-10-CM | POA: Diagnosis not present

## 2023-01-24 DIAGNOSIS — E1122 Type 2 diabetes mellitus with diabetic chronic kidney disease: Secondary | ICD-10-CM | POA: Diagnosis not present

## 2023-01-25 ENCOUNTER — Ambulatory Visit: Admission: RE | Admit: 2023-01-25 | Payer: PPO | Source: Home / Self Care | Admitting: Orthopedic Surgery

## 2023-01-25 DIAGNOSIS — Z01818 Encounter for other preprocedural examination: Secondary | ICD-10-CM

## 2023-01-25 HISTORY — DX: Chronic kidney disease, stage 3a: N18.31

## 2023-01-25 HISTORY — DX: Obstructive sleep apnea (adult) (pediatric): G47.33

## 2023-01-25 HISTORY — DX: Atrioventricular block, complete: I44.2

## 2023-01-25 SURGERY — KNEE ARTHROSCOPY WITH MEDIAL MENISECTOMY
Anesthesia: Choice | Site: Knee | Laterality: Right

## 2023-01-29 DIAGNOSIS — R0602 Shortness of breath: Secondary | ICD-10-CM | POA: Diagnosis not present

## 2023-01-29 DIAGNOSIS — R002 Palpitations: Secondary | ICD-10-CM | POA: Diagnosis not present

## 2023-01-29 DIAGNOSIS — I119 Hypertensive heart disease without heart failure: Secondary | ICD-10-CM | POA: Diagnosis not present

## 2023-01-29 DIAGNOSIS — E782 Mixed hyperlipidemia: Secondary | ICD-10-CM | POA: Diagnosis not present

## 2023-01-29 DIAGNOSIS — N1831 Chronic kidney disease, stage 3a: Secondary | ICD-10-CM | POA: Diagnosis not present

## 2023-01-29 DIAGNOSIS — G4733 Obstructive sleep apnea (adult) (pediatric): Secondary | ICD-10-CM | POA: Diagnosis not present

## 2023-01-29 DIAGNOSIS — I2510 Atherosclerotic heart disease of native coronary artery without angina pectoris: Secondary | ICD-10-CM | POA: Diagnosis not present

## 2023-01-29 DIAGNOSIS — I700 Atherosclerosis of aorta: Secondary | ICD-10-CM | POA: Diagnosis not present

## 2023-01-29 DIAGNOSIS — E1122 Type 2 diabetes mellitus with diabetic chronic kidney disease: Secondary | ICD-10-CM | POA: Diagnosis not present

## 2023-01-29 DIAGNOSIS — I10 Essential (primary) hypertension: Secondary | ICD-10-CM | POA: Diagnosis not present

## 2023-01-30 DIAGNOSIS — N200 Calculus of kidney: Secondary | ICD-10-CM | POA: Diagnosis not present

## 2023-01-30 DIAGNOSIS — N1832 Chronic kidney disease, stage 3b: Secondary | ICD-10-CM | POA: Diagnosis not present

## 2023-01-30 DIAGNOSIS — R3129 Other microscopic hematuria: Secondary | ICD-10-CM | POA: Diagnosis not present

## 2023-01-30 DIAGNOSIS — K219 Gastro-esophageal reflux disease without esophagitis: Secondary | ICD-10-CM | POA: Diagnosis not present

## 2023-01-30 DIAGNOSIS — G4733 Obstructive sleep apnea (adult) (pediatric): Secondary | ICD-10-CM | POA: Diagnosis not present

## 2023-01-30 DIAGNOSIS — N400 Enlarged prostate without lower urinary tract symptoms: Secondary | ICD-10-CM | POA: Diagnosis not present

## 2023-01-30 DIAGNOSIS — E785 Hyperlipidemia, unspecified: Secondary | ICD-10-CM | POA: Diagnosis not present

## 2023-01-30 DIAGNOSIS — E1122 Type 2 diabetes mellitus with diabetic chronic kidney disease: Secondary | ICD-10-CM | POA: Diagnosis not present

## 2023-01-30 DIAGNOSIS — N1339 Other hydronephrosis: Secondary | ICD-10-CM | POA: Diagnosis not present

## 2023-01-30 DIAGNOSIS — R809 Proteinuria, unspecified: Secondary | ICD-10-CM | POA: Diagnosis not present

## 2023-01-30 DIAGNOSIS — K759 Inflammatory liver disease, unspecified: Secondary | ICD-10-CM | POA: Diagnosis not present

## 2023-01-30 DIAGNOSIS — E668 Other obesity: Secondary | ICD-10-CM | POA: Diagnosis not present

## 2023-02-07 DIAGNOSIS — G4731 Primary central sleep apnea: Secondary | ICD-10-CM | POA: Diagnosis not present

## 2023-02-13 DIAGNOSIS — I119 Hypertensive heart disease without heart failure: Secondary | ICD-10-CM | POA: Diagnosis not present

## 2023-02-13 DIAGNOSIS — R002 Palpitations: Secondary | ICD-10-CM | POA: Diagnosis not present

## 2023-02-13 DIAGNOSIS — I2510 Atherosclerotic heart disease of native coronary artery without angina pectoris: Secondary | ICD-10-CM | POA: Diagnosis not present

## 2023-02-15 ENCOUNTER — Other Ambulatory Visit: Payer: Self-pay | Admitting: Physician Assistant

## 2023-02-18 DIAGNOSIS — I10 Essential (primary) hypertension: Secondary | ICD-10-CM | POA: Diagnosis not present

## 2023-02-18 DIAGNOSIS — I2510 Atherosclerotic heart disease of native coronary artery without angina pectoris: Secondary | ICD-10-CM | POA: Diagnosis not present

## 2023-02-18 DIAGNOSIS — E782 Mixed hyperlipidemia: Secondary | ICD-10-CM | POA: Diagnosis not present

## 2023-02-18 DIAGNOSIS — I119 Hypertensive heart disease without heart failure: Secondary | ICD-10-CM | POA: Diagnosis not present

## 2023-02-18 DIAGNOSIS — I152 Hypertension secondary to endocrine disorders: Secondary | ICD-10-CM | POA: Diagnosis not present

## 2023-02-18 DIAGNOSIS — E1159 Type 2 diabetes mellitus with other circulatory complications: Secondary | ICD-10-CM | POA: Diagnosis not present

## 2023-02-18 DIAGNOSIS — G4733 Obstructive sleep apnea (adult) (pediatric): Secondary | ICD-10-CM | POA: Diagnosis not present

## 2023-02-18 DIAGNOSIS — I700 Atherosclerosis of aorta: Secondary | ICD-10-CM | POA: Diagnosis not present

## 2023-02-19 ENCOUNTER — Other Ambulatory Visit: Payer: Self-pay | Admitting: Orthopedic Surgery

## 2023-02-20 ENCOUNTER — Other Ambulatory Visit: Payer: Self-pay | Admitting: Physician Assistant

## 2023-02-21 DIAGNOSIS — S83241A Other tear of medial meniscus, current injury, right knee, initial encounter: Secondary | ICD-10-CM | POA: Diagnosis not present

## 2023-02-22 ENCOUNTER — Encounter: Payer: Self-pay | Admitting: Orthopedic Surgery

## 2023-02-22 ENCOUNTER — Encounter
Admission: RE | Admit: 2023-02-22 | Discharge: 2023-02-22 | Disposition: A | Discharge status: Home / Self Care | Payer: PPO | Source: Ambulatory Visit | Attending: Orthopedic Surgery | Admitting: Orthopedic Surgery

## 2023-02-22 VITALS — Ht 72.0 in | Wt 235.9 lb

## 2023-02-22 DIAGNOSIS — Z01818 Encounter for other preprocedural examination: Secondary | ICD-10-CM

## 2023-02-22 HISTORY — DX: Obstructive sleep apnea (adult) (pediatric): G47.33

## 2023-02-22 HISTORY — DX: Other tear of medial meniscus, current injury, unspecified knee, initial encounter: S83.249A

## 2023-02-22 NOTE — Progress Notes (Signed)
Perioperative / Anesthesia Services  Pre-Admission Testing Clinical Review / Pre-Operative Anesthesia Consult  Date: 02/22/23  Patient Demographics:  Name: Manuel Stafford. DOB:   April 29, 1946 MRN:   [id number]  Planned Surgical Procedure(s):    Case: [id number] Date/Time: 02/26/23 1208   Procedure: Right knee arthroscopic partial medial meniscectomy (Right: Knee)   Anesthesia type: Choice   Pre-op diagnosis: Tear of medial meniscus of right knee, current, unspecified tear type, initial encounter S83.241A   Location: [facility] OR ROOM 01 / [facility]   Surgeons: Signa Kell, MD     NOTE: Available PAT nursing documentation and vital signs have been reviewed. Clinical nursing staff has updated patient's PMH/PSHx, current medication list, and drug allergies/intolerances to ensure comprehensive history available to assist in medical decision making as it pertains to the aforementioned surgical procedure and anticipated anesthetic course. Extensive review of available clinical information personally performed. Manuel Stafford PMH and PSHx updated with any diagnoses/procedures that  may have been inadvertently omitted during his intake with the pre-admission testing department's nursing staff.  Clinical Discussion:  Manuel Stafford. is a 77 y.o. male who is submitted for pre-surgical anesthesia review and clearance prior to him undergoing the above procedure.Patient is a Former Smoker (30 pack years; quit 06/1974). Pertinent PMH includes: CAD, hypertensive heart disease, diastolic dysfunction, intermittent CHB, RBBB, aortic atherosclerosis, HTN, HLD, T2DM, GERD (on daily PPI), OSAH (requires nocturnal PAP therapy), lung nodules, recurrent aspiration pneumonia, ED (on PDE5i), OA, RIGHT medial meniscus tear, cervical osteophytes, cervical and lumbar DDD, lumbar spondylosis, chronic back pain, parkinsonian features, RLS, essential tremors, anxiety, depression.   Patient is followed  by cardiology Darrold Junker, MD). He was last seen in the cardiology clinic on 02/18/2023; notes reviewed. At the time of his clinic visit, patient doing well overall from a cardiovascular perspective. Patient denied any chest pain, shortness of breath, PND, orthopnea, palpitations, significant peripheral edema, weakness, fatigue, vertiginous symptoms, or presyncope/syncope. Patient with a past medical history significant for cardiovascular diagnoses. Documented physical exam was grossly benign, providing no evidence of acute exacerbation and/or decompensation of the patient's known cardiovascular conditions.  Long-term cardiac event monitor study performed on 01/29/2023 revealing a predominant underlying sinus rhythm at an average rate of 75 bpm; range 30-213 bpm.  Patient with 2 short runs of second-degree AV block type I down to a rate of as low as 30 bpm; short duration noted.  Overall PVC burden was 4%.  Most recent TTE was performed on 02/13/2023 revealing ventricular systolic function with an EF >55%.  There was mild biventricular and biatrial enlargement. Left ventricular diastolic Doppler parameters consistent with abnormal relaxation (G1DD).  There was trivial tricuspid and pulmonary, in addition to mild mitral valve regurgitation.  All transvalvular gradients were noted be normal providing no evidence suggestive of valvular stenosis.  RVSP 18.1 mmHg.  Aorta normal in size with no evidence of aneurysmal dilatation.  Most recent myocardial perfusion imaging study was also performed on 02/13/2023 revealing a normal left ventricular systolic function with EF of 61%.  There were no regional wall motion abnormalities.  There was no evidence of stress-induced myocardial ischemia or arrhythmia; no scintigraphic evidence of scar.  Study determined to be normal and low risk.  Blood pressure well controlled at 120/80 mmHg on currently prescribed diuretic (HCTZ) and ARB (telmisartan) therapies.  Patient is on  rosuvastatin for his HLD diagnosis and ASCVD prevention. T2DM well-controlled on currently prescribed regimen; last HgbA1c was 7.3% when checked on 12/11/2022. Due  to patient's cardiovascular history, it is important to note that he is on a PDE5i (tadalafil) for an erectile dysfunction diagnosis.  Patient does have an OSAH diagnosis that is currently being maintained with BiPAP therapy. Patient is able to complete all of his  ADL/IADLs without cardiovascular limitation.  Per the DASI, patient is able to achieve at least 4 METS of physical activity without experiencing any significant degree of angina/anginal equivalent symptoms. No changes were made to his medication regimen.  Patient to follow-up with outpatient cardiology in 1 year or sooner if needed.   Manuel Stafford. is scheduled for an elective RIGHT KNEE ARTHROSCOPIC PARTIAL MEDIAL MENISCECTOMY on 02/26/2023 with Dr. Signa Kell, MD.  Given patient's past medical history significant for cardiovascular diagnoses, presurgical cardiac clearance was sought by the PAT team.  Per cardiology, "overall unremarkable workup, including Holter monitor, echo, and Myoview.  Patient is at an overall LOW risk for knee surgery, and is cleared from a cardiovascular standpoint.  No need for any further workup at this time".  In review of his medication reconciliation, it is noted that patient is currently on prescribed daily antithrombotic therapy. He has been instructed on recommendations for holding his daily low dose ASA for 7 days prior to his procedure with plans to restart as soon as postoperative bleeding risk felt to be minimized by his attending surgeon. The patient has been instructed that his last dose of his ASA should be on 02/18/2023.  Patient denies previous perioperative complications with anesthesia in the past. In review of the available records, it is noted that patient underwent a general anesthetic course here at [facility] ([facility]) in 03/2022 without documented complications.      02/22/2023   10:00 AM 01/18/2023    9:16 AM 07/18/2022    8:34 AM  Vitals with BMI  Height 6\' 0"  6\' 0"  6\' 0"   Weight 235 lbs 14 oz 235 lbs 232 lbs  BMI 31.99 31.86 31.46  Systolic   137  Diastolic   75  Pulse   69    Providers/Specialists:   NOTE: Primary physician provider listed below. Patient may have been seen by APP or partner within same practice.   PROVIDER ROLE / SPECIALTY LAST Sarina Ser, MD Orthopedics (Surgeon) 02/21/2023  Bosie Clos, MD Primary Care Provider 12/11/2022  Marcina Millard, MD Cardiology 02/18/2023  Lorain Childes, MD Nephrology 01/30/2023   Allergies:  Patient has no known allergies.  Current Home Medications:   No current facility-administered medications for this encounter.    aspirin 81 MG tablet   Cholecalciferol (VITAMIN D3) 2000 UNITS capsule   Continuous Blood Gluc Sensor (FREESTYLE LIBRE 2 SENSOR) MISC   hydrochlorothiazide (HYDRODIURIL) 25 MG tablet   ketoconazole (NIZORAL) 2 % cream   meclizine (ANTIVERT) 25 MG tablet   metFORMIN (GLUCOPHAGE) 1000 MG tablet   MULTIPLE VITAMIN PO   pantoprazole (PROTONIX) 40 MG tablet   rosuvastatin (CRESTOR) 20 MG tablet   Saw Palmetto, Serenoa repens, (SAW PALMETTO BERRY) 160 MG CAPS   Semaglutide, 1 MG/DOSE, (OZEMPIC, 1 MG/DOSE,) 2 MG/1.5ML SOPN   tadalafil (CIALIS) 5 MG tablet   tamsulosin (FLOMAX) 0.4 MG CAPS capsule   telmisartan (MICARDIS) 40 MG tablet   tiZANidine (ZANAFLEX) 4 MG tablet   History:   Past Medical History:  Diagnosis Date   Anxiety    Aortic atherosclerosis (HCC)    Cervical osteophyte    Chronic lower back pain  Coronary artery disease    DDD (degenerative disc disease), cervical    DDD (degenerative disc disease), lumbar    Depression    Diastolic dysfunction 05/31/2021   a.) TTE 05/31/2021: EF >55%, mild BAE, mild RVE, triv TR/PR, mild MR, G1DD.   Erectile dysfunction     a.) on PDE5i (tadalafil)   Fatty liver disease, nonalcoholic    GERD (gastroesophageal reflux disease)    Hemorrhoids 2017   History of kidney stones    Hyperlipidemia    Hypertension    Hypertensive left ventricular hypertrophy    Intermittent complete heart block (HCC)    Left inguinal hernia    Long term (current) use of aspirin    Lumbar spondylolysis    Lung nodules    Medial meniscus tear    Obstructive sleep apnea treated with BiPAP    OSA treated with BiPAP    Osteoarthritis    Parkinsonian features    RBBB (right bundle branch block)    Recurrent aspiration pneumonia (HCC)    RLS (restless legs syndrome)    a.) on ropinirole   Sigmoid diverticulosis    Stage 3a chronic kidney disease (CKD) (HCC)    T2DM (type 2 diabetes mellitus) (HCC)    Tremor    Past Surgical History:  Procedure Laterality Date   ANTERIOR CERVICAL DECOMP/DISCECTOMY FUSION N/A 04/04/2022   Procedure: C3-6 OSTEOPHYTE REMOVAL;  Surgeon: Venetia Night, MD;  Location: [facility] ORS;  Service: Neurosurgery;  Laterality: N/A;   CIRCUMCISION     COLONOSCOPY  2007   COLONOSCOPY WITH PROPOFOL N/A 08/22/2016   Procedure: COLONOSCOPY WITH PROPOFOL;  Surgeon: Kieth Brightly, MD;  Location: [facility];  Service: Endoscopy;  Laterality: N/A;   CYSTOSCOPY WITH STENT PLACEMENT Right 09/18/2015   Procedure: CYSTOSCOPY WITH STENT PLACEMENT;  Surgeon: Vanna Scotland, MD;  Location: [facility]S;  Service: Urology;  Laterality: Right;   CYSTOSCOPY WITH URETEROSCOPY, STONE BASKETRY AND STENT PLACEMENT Right 10/03/2015   Procedure: CYSTOSCOPY WITH URETEROSCOPY, STONE BASKETRY AND STENT PLACEMENT;  Surgeon: Vanna Scotland, MD;  Location: [facility] ORS;  Service: Urology;  Laterality: Right;   EXTRACORPOREAL SHOCK WAVE LITHOTRIPSY Right 09/15/2015   Procedure: EXTRACORPOREAL SHOCK WAVE LITHOTRIPSY (ESWL);  Surgeon: Hildred Laser, MD;  Location: [facility] ORS;  Service: Urology;  Laterality: Right;   HERNIA REPAIR      ROTATOR CUFF REPAIR Right 06/2012   Family History  Problem Relation Age of Onset   Hypertension Mother    Diabetes Father    CAD Father    Diabetes Sister    Heart disease Sister    CAD Sister    Social History   Tobacco Use   Smoking status: Former    Current packs/day: 0.00    Average packs/day: 2.0 packs/day for 15.0 years (30.0 ttl pk-yrs)    Types: Cigarettes    Start date: 06/26/1959    Quit date: 06/25/1974    Years since quitting: 48.6   Smokeless tobacco: Never  Vaping Use   Vaping status: Never Used  Substance Use Topics   Alcohol use: Yes    Comment: occ wine   Drug use: No    Pertinent Clinical Results:  LABS:   Component 01/24/23  WBC 7.8  RBC 4.47  Hemoglobin 13.9  Hematocrit 41.2  MCV 92.2  MCH 31.1  MCHC 33.7  RDW 13.0  Platelets 187  MPV 11.1  Neutrophils Absolute 4,664  Lymphocytes Absolute 2,285  Monocytes Absolute 702  Eosinophils Absolute 117  Basophils Absolute 31  Neutrophils Relative 59.8  Lymphocytes 29.3  Monocytes 9.0  Eosinophils 1.5  Basophils Relative 0.4   Component Ref Range & Units 01/24/2023  Glucose 65 - 99 mg/dL 130 High   BUN 7 - 25 mg/dL 24  Creatinine 8.65 - 7.84 mg/dL 6.96 High   eGFR CKD-EPI CR 2021 > OR = 60 mL/min/1.58m2 44 Low   BUN/Creatinine Ratio 6 - 22 (calc) 15  Sodium 135 - 146 mmol/L 136  Potassium 3.5 - 5.3 mmol/L 4.6  Chloride 98 - 110 mmol/L 101  Bicarbonate (CO2) 20 - 32 mmol/L 24  Calcium 8.6 - 10.3 mg/dL 9.6  Phosphorus 2.1 - 4.3 mg/dL 3.2  Albumin 3.6 - 5.1 g/dL 4.1   Component Ref Range & Units 12/11/2022  Hemoglobin A1C 4.2 - 5.6 % 7.3 High   Average Blood Glucose (Calc) mg/dL 295  Resulting Agency [facility]  Narrative Performed by [facility] Normal Range:    4.2 - 5.6% Increased Risk:  5.7 - 6.4% Diabetes:        >= 6.5% Glycemic Control for adults with diabetes:  <7%   Specimen Collected: 12/11/22 11:57   Performed by: [facility] Last Resulted: 12/11/22 15:48  Received From: [facility]  Result Received: 12/11/22 16:34     ECG: Date: 01/29/2023 Time ECG obtained: 1505 AM Rate: 87 bpm Rhythm:  Sinus rhythm with 1st degree AVB with PACs; RBBB Axis (leads I and aVF): Normal Intervals: PR 87 ms. QRS 144 ms. QTc 495 ms. ST segment and T wave changes: No evidence of acute ST segment elevation or depression.   Comparison: Similar to previous tracing obtained on 03/26/2022   IMAGING / PROCEDURES: LONG TERM CARDIAC EVENT MONITOR STUDY performed on 01/29/2023 Predominant underlying sinus rhythm at an average rate of 75 bpm; range 30-213 bpm.   2 short runs of second-degree AV block type I down to a rate of as low as 30 bpm; short duration noted.   Overall PVC burden was 4%.  MYOCARDIAL PERFUSION IMAGING STUDY (LEXISCAN) performed on 02/13/2023 Normal left ventricular systolic function with a normal LVEF of 61% Normal myocardial thickening and wall motion Left ventricular cavity size normal SPECT images demonstrate homogenous tracer distribution throughout the myocardium No evidence of stress-induced myocardial ischemia or arrhythmia Normal low risk study  TRANSTHORACIC ECHOCARDIOGRAM performed on 02/13/2023 Left ventricular systolic function with EF of >55% Left ventricular diastolic Doppler parameters consistent with abnormal relaxation (G1DD). Mild biventricular enlargement Mild biatrial enlargement Trivial TR and PR Mild MR Normal gradients; no valvular stenosis  MR KNEE RIGHT WO CONTRAST performed on 12/07/2022 Irregular radial tear of the posterior horn of the medial meniscus, peripheral to the meniscal root. Underlying mild-to-moderate medial compartment degenerative chondrosis. No acute osseous findings. The lateral meniscus, cruciate and collateral ligaments are intact.  CT ABDOMEN PELVIS WO CONTRAST performed on 12/03/2022 No acute localizing process in the  abdomen or pelvis. Cholelithiasis. Nonobstructing left renal calculus. Sigmoid colon diverticulosis. Aortic atherosclerosis  CT CERVICAL SPINE WO CONTRAST performed on 02/14/2022 There are bulky anterior osteophytes at all levels, but most significantly C3-4, C4-5 and C5-6 with the greatest impression on the posterior cervical esophagus at C3-4 and C4-5 possibly explaining the patient's symptoms. There is osteophytic bridging at C2-3 and C6-7, attempted bridging at other levels. Additional posterior endplate spurring, for the most part is nonstenosing except at C6-7 where there is mild encroachment on the ventral cord surface. Multilevel degenerative foraminal stenosis.  Degenerative discopathy greatest at C6-7. Emphysema. Bilaterally ossified stylohyoid ligaments. This may be seen with Eagle's syndrome.    Impression and Plan:  Manuel R Francesca Vos. has been referred for pre-anesthesia review and clearance prior to him undergoing the planned anesthetic and procedural courses. Available labs, pertinent testing, and imaging results were personally reviewed by me in preparation for upcoming operative/procedural course. [facility] medical record has been updated following extensive record review and patient interview with PAT staff.   This patient has been appropriately cleared by cardiology with an overall LOW risk of experiencing significant perioperative cardiovascular complications. Based on clinical review performed today (02/22/23), barring any significant acute changes in the patient's overall condition, it is anticipated that he will be able to proceed with the planned surgical intervention. Any acute changes in clinical condition may necessitate his procedure being postponed and/or cancelled. Patient will meet with anesthesia team (MD and/or CRNA) on the day of his procedure for preoperative evaluation/assessment. Questions regarding anesthetic course will be fielded at that time.    Pre-surgical instructions were reviewed with the patient during his PAT appointment, and questions were fielded to satisfaction by PAT clinical staff. He has been instructed on which medications that he will need to hold prior to surgery, as well as the ones that have been deemed safe/appropriate to take on the day of his procedure. As part of the general education provided by PAT, patient made aware both verbally and in writing, that he would need to abstain from the use of any illegal substances during his perioperative course.  He was advised that failure to follow the provided instructions could necessitate case cancellation or result in serious perioperative complications up to and including death. Patient encouraged to contact PAT and/or his surgeon's office to discuss any questions or concerns that may arise prior to surgery; verbalized understanding.   Quentin Mulling, MSN, APRN, FNP-C, CEN [facility]  Peri-operative Services Nurse Practitioner Phone: [phone] Fax: [phone] Email: [email or web address] 02/22/23 11:42 AM  NOTE: This note has been prepared using Scientist, clinical (histocompatibility and immunogenetics). Despite my best ability to proofread, there is always the potential that unintentional transcriptional errors may still occur from this process.

## 2023-02-22 NOTE — Patient Instructions (Addendum)
Your procedure is scheduled on:02-26-23 Tuesday Report to the Registration Desk on the 1st floor of the [facility].Then proceed to the 2nd floor Surgery Desk To find out your arrival time, please call [phone] between 1PM - 3PM on:02-22-23 Friday If your arrival time is 6:00 am, do not arrive before that time as the [facility] entrance doors do not open until 6:00 am.  REMEMBER: Instructions that are not followed completely may result in serious medical risk, up to and including death; or upon the discretion of your surgeon and anesthesiologist your surgery may need to be rescheduled.  Do not eat food after midnight the night before surgery.  No gum chewing or hard candies.  You may however, drink Water up to 2 hours before you are scheduled to arrive for your surgery. Do not drink anything within 2 hours of your scheduled arrival time . In addition, your doctor has ordered for you to drink the provided:  Gatorade G2 Drinking this carbohydrate drink up to two hours before surgery helps to reduce insulin resistance and improve patient outcomes. Please complete drinking 2 hours before scheduled arrival time.  One week prior to surgery: Stop Anti-inflammatories (NSAIDS) such as Advil, Aleve, Ibuprofen, Motrin, Naproxen, Naprosyn and Aspirin based products such as Excedrin, Goody's Powder, BC Powder.You may however, continue to take Tylenol if needed for pain up until the day of surgery. Stop ANY OVER THE COUNTER supplements/vitamins NOW (02-22-23) until after surgery (Vitamin D3, Multivitamin and Saw Palmetto)   Continue taking all prescribed medications with the exception of the following: -Ozempic-Stop 7 days prior to surgery-Last dose was on 02-14-23-Do NOT take again until AFTER surgery -metFORMIN (GLUCOPHAGE)-Stop 2 days prior to surgery-Last dose will be on 02-23-23 Saturday -tadalafil (CIALIS)-Stop 2 days prior to surgery-Last dose will be on 02-23-23 Saturday   TAKE ONLY THESE  MEDICATIONS THE MORNING OF SURGERY WITH A SIP OF WATER: -tamsulosin (FLOMAX  -pantoprazole (PROTONIX)-take one the night before and one on the morning of surgery - helps to prevent nausea after surgery.)  Continue your 81 mg Aspirin as instructed by Dr Allena Katz in his office-Do NOT take the morning of surgery  No Alcohol for 24 hours before or after surgery.  No Smoking including e-cigarettes for 24 hours before surgery.  No chewable tobacco products for at least 6 hours before surgery.  No nicotine patches on the day of surgery.  Do not use any "recreational" drugs for at least a week (preferably 2 weeks) before your surgery.  Please be advised that the combination of cocaine and anesthesia may have negative outcomes, up to and including death. If you test positive for cocaine, your surgery will be cancelled.  On the morning of surgery brush your teeth with toothpaste and water, you may rinse your mouth with mouthwash if you wish. Do not swallow any toothpaste or mouthwash.  Use CHG Soap as directed on instruction sheet.  Do not wear jewelry, make-up, hairpins, clips or nail polish.  Do not wear lotions, powders, or perfumes.   Do not shave body hair from the neck down 48 hours before surgery.  Contact lenses, hearing aids and dentures may not be worn into surgery.  Do not bring valuables to the hospital. [facility] is not responsible for any missing/lost belongings or valuables.   Bring your Bi-PAP to the hospital  Notify your doctor if there is any change in your medical condition (cold, fever, infection).  Wear comfortable clothing (specific to your surgery type) to the  hospital.  After surgery, you can help prevent lung complications by doing breathing exercises.  Take deep breaths and cough every 1-2 hours. Your doctor may order a device called an Incentive Spirometer to help you take deep breaths. When coughing or sneezing, hold a pillow firmly against your incision with  both hands. This is called "splinting." Doing this helps protect your incision. It also decreases belly discomfort.  If you are being admitted to the hospital overnight, leave your suitcase in the car. After surgery it may be brought to your room.  In case of increased patient census, it may be necessary for you, the patient, to continue your postoperative care in the Same Day Surgery department.  If you are being discharged the day of surgery, you will not be allowed to drive home. You will need a responsible individual to drive you home and stay with you for 24 hours after surgery.   If you are taking public transportation, you will need to have a responsible individual with you.  Please call the Pre-admissions Testing Dept. at [phone] if you have any questions about these instructions.  Surgery Visitation Policy:  Patients having surgery or a procedure may have two visitors.  Children under the age of 37 must have an adult with them who is not the patient.     Preparing for Surgery with CHLORHEXIDINE GLUCONATE (CHG) Soap  Chlorhexidine Gluconate (CHG) Soap  o An antiseptic cleaner that kills germs and bonds with the skin to continue killing germs even after washing  o Used for showering the night before surgery and morning of surgery  Before surgery, you can play an important role by reducing the number of germs on your skin.  CHG (Chlorhexidine gluconate) soap is an antiseptic cleanser which kills germs and bonds with the skin to continue killing germs even after washing.  Please do not use if you have an allergy to CHG or antibacterial soaps. If your skin becomes reddened/irritated stop using the CHG.  1. Shower the NIGHT BEFORE SURGERY and the MORNING OF SURGERY with CHG soap.  2. If you choose to wash your hair, wash your hair first as usual with your normal shampoo.  3. After shampooing, rinse your hair and body thoroughly to remove the shampoo.  4. Use CHG as  you would any other liquid soap. You can apply CHG directly to the skin and wash gently with a scrungie or a clean washcloth.  5. Apply the CHG soap to your body only from the neck down. Do not use on open wounds or open sores. Avoid contact with your eyes, ears, mouth, and genitals (private parts). Wash face and genitals (private parts) with your normal soap.  6. Wash thoroughly, paying special attention to the area where your surgery will be performed.  7. Thoroughly rinse your body with warm water.  8. Do not shower/wash with your normal soap after using and rinsing off the CHG soap.  9. Pat yourself dry with a clean towel.  10. Wear clean pajamas to bed the night before surgery.  12. Place clean sheets on your bed the night of your first shower and do not sleep with pets.  13. Shower again with the CHG soap on the day of surgery prior to arriving at the hospital.  14. Do not apply any deodorants/lotions/powders.  15. Please wear clean clothes to the hospital.  How to Use an Incentive Spirometer An incentive spirometer is a tool that measures how well you are  filling your lungs with each breath. Learning to take long, deep breaths using this tool can help you keep your lungs clear and active. This may help to reverse or lessen your chance of developing breathing (pulmonary) problems, especially infection. You may be asked to use a spirometer: After a surgery. If you have a lung problem or a history of smoking. After a long period of time when you have been unable to move or be active. If the spirometer includes an indicator to show the highest number that you have reached, your health care provider or respiratory therapist will help you set a goal. Keep a log of your progress as told by your health care provider. What are the risks? Breathing too quickly may cause dizziness or cause you to pass out. Take your time so you do not get dizzy or light-headed. If you are in pain, you may  need to take pain medicine before doing incentive spirometry. It is harder to take a deep breath if you are having pain. How to use your incentive spirometer  Sit up on the edge of your bed or on a chair. Hold the incentive spirometer so that it is in an upright position. Before you use the spirometer, breathe out normally. Place the mouthpiece in your mouth. Make sure your lips are closed tightly around it. Breathe in slowly and as deeply as you can through your mouth, causing the piston or the ball to rise toward the top of the chamber. Hold your breath for 3-5 seconds, or for as long as possible. If the spirometer includes a coach indicator, use this to guide you in breathing. Slow down your breathing if the indicator goes above the marked areas. Remove the mouthpiece from your mouth and breathe out normally. The piston or ball will return to the bottom of the chamber. Rest for a few seconds, then repeat the steps 10 or more times. Take your time and take a few normal breaths between deep breaths so that you do not get dizzy or light-headed. Do this every 1-2 hours when you are awake. If the spirometer includes a goal marker to show the highest number you have reached (best effort), use this as a goal to work toward during each repetition. After each set of 10 deep breaths, cough a few times. This will help to make sure that your lungs are clear. If you have an incision on your chest or abdomen from surgery, place a pillow or a rolled-up towel firmly against the incision when you cough. This can help to reduce pain while taking deep breaths and coughing. General tips When you are able to get out of bed: Walk around often. Continue to take deep breaths and cough in order to clear your lungs. Keep using the incentive spirometer until your health care provider says it is okay to stop using it. If you have been in the hospital, you may be told to keep using the spirometer at home. Contact a  health care provider if: You are having difficulty using the spirometer. You have trouble using the spirometer as often as instructed. Your pain medicine is not giving enough relief for you to use the spirometer as told. You have a fever. Get help right away if: You develop shortness of breath. You develop a cough with bloody mucus from the lungs. You have fluid or blood coming from an incision site after you cough. Summary An incentive spirometer is a tool that can help you learn to  take long, deep breaths to keep your lungs clear and active. You may be asked to use a spirometer after a surgery, if you have a lung problem or a history of smoking, or if you have been inactive for a long period of time. Use your incentive spirometer as instructed every 1-2 hours while you are awake. If you have an incision on your chest or abdomen, place a pillow or a rolled-up towel firmly against your incision when you cough. This will help to reduce pain. Get help right away if you have shortness of breath, you cough up bloody mucus, or blood comes from your incision when you cough. This information is not intended to replace advice given to you by your health care provider. Make sure you discuss any questions you have with your health care provider. Document Revised: 08/31/2019 Document Reviewed: 08/31/2019 Elsevier Patient Education  2024 [employer]. .

## 2023-02-24 ENCOUNTER — Other Ambulatory Visit: Payer: Self-pay | Admitting: Physician Assistant

## 2023-02-26 ENCOUNTER — Telehealth: Payer: Self-pay

## 2023-02-26 ENCOUNTER — Other Ambulatory Visit: Payer: Self-pay

## 2023-02-26 ENCOUNTER — Ambulatory Visit: Payer: PPO | Admitting: Urgent Care

## 2023-02-26 ENCOUNTER — Encounter: Payer: Self-pay | Admitting: Orthopedic Surgery

## 2023-02-26 ENCOUNTER — Encounter
Admission: RE | Disposition: A | Discharge status: Home / Self Care | Payer: Self-pay | Source: Home / Self Care | Attending: Orthopedic Surgery

## 2023-02-26 ENCOUNTER — Ambulatory Visit
Admission: RE | Admit: 2023-02-26 | Discharge: 2023-02-26 | Disposition: A | Discharge status: Home / Self Care | Payer: PPO | Attending: Orthopedic Surgery | Admitting: Orthopedic Surgery

## 2023-02-26 DIAGNOSIS — M1711 Unilateral primary osteoarthritis, right knee: Secondary | ICD-10-CM | POA: Diagnosis not present

## 2023-02-26 DIAGNOSIS — I4510 Unspecified right bundle-branch block: Secondary | ICD-10-CM | POA: Diagnosis not present

## 2023-02-26 DIAGNOSIS — G8929 Other chronic pain: Secondary | ICD-10-CM | POA: Insufficient documentation

## 2023-02-26 DIAGNOSIS — Z7984 Long term (current) use of oral hypoglycemic drugs: Secondary | ICD-10-CM | POA: Diagnosis not present

## 2023-02-26 DIAGNOSIS — G4733 Obstructive sleep apnea (adult) (pediatric): Secondary | ICD-10-CM | POA: Insufficient documentation

## 2023-02-26 DIAGNOSIS — K219 Gastro-esophageal reflux disease without esophagitis: Secondary | ICD-10-CM | POA: Diagnosis not present

## 2023-02-26 DIAGNOSIS — Z87891 Personal history of nicotine dependence: Secondary | ICD-10-CM | POA: Insufficient documentation

## 2023-02-26 DIAGNOSIS — G2581 Restless legs syndrome: Secondary | ICD-10-CM | POA: Diagnosis not present

## 2023-02-26 DIAGNOSIS — I2510 Atherosclerotic heart disease of native coronary artery without angina pectoris: Secondary | ICD-10-CM | POA: Insufficient documentation

## 2023-02-26 DIAGNOSIS — S83241A Other tear of medial meniscus, current injury, right knee, initial encounter: Secondary | ICD-10-CM | POA: Diagnosis not present

## 2023-02-26 DIAGNOSIS — G20C Parkinsonism, unspecified: Secondary | ICD-10-CM | POA: Diagnosis not present

## 2023-02-26 DIAGNOSIS — I129 Hypertensive chronic kidney disease with stage 1 through stage 4 chronic kidney disease, or unspecified chronic kidney disease: Secondary | ICD-10-CM | POA: Insufficient documentation

## 2023-02-26 DIAGNOSIS — K760 Fatty (change of) liver, not elsewhere classified: Secondary | ICD-10-CM | POA: Diagnosis not present

## 2023-02-26 DIAGNOSIS — N1831 Chronic kidney disease, stage 3a: Secondary | ICD-10-CM | POA: Diagnosis not present

## 2023-02-26 DIAGNOSIS — E1122 Type 2 diabetes mellitus with diabetic chronic kidney disease: Secondary | ICD-10-CM | POA: Diagnosis not present

## 2023-02-26 DIAGNOSIS — X58XXXA Exposure to other specified factors, initial encounter: Secondary | ICD-10-CM | POA: Insufficient documentation

## 2023-02-26 DIAGNOSIS — E785 Hyperlipidemia, unspecified: Secondary | ICD-10-CM | POA: Insufficient documentation

## 2023-02-26 DIAGNOSIS — Z01818 Encounter for other preprocedural examination: Secondary | ICD-10-CM

## 2023-02-26 HISTORY — DX: Other cervical disc degeneration, unspecified cervical region: M50.30

## 2023-02-26 HISTORY — DX: Diverticulosis of large intestine without perforation or abscess without bleeding: K57.30

## 2023-02-26 HISTORY — PX: KNEE ARTHROSCOPY WITH MEDIAL MENISECTOMY: SHX5651

## 2023-02-26 HISTORY — DX: Long term (current) use of aspirin: Z79.82

## 2023-02-26 LAB — GLUCOSE, CAPILLARY
Glucose-Capillary: 113 mg/dL — ABNORMAL HIGH (ref 70–99)
Glucose-Capillary: 131 mg/dL — ABNORMAL HIGH (ref 70–99)

## 2023-02-26 SURGERY — ARTHROSCOPY, KNEE, WITH MEDIAL MENISCECTOMY
Anesthesia: General | Site: Knee | Laterality: Right

## 2023-02-26 MED ORDER — ORAL CARE MOUTH RINSE: 15.0000 mL | Freq: Once | OROMUCOSAL | Status: AC

## 2023-02-26 MED ORDER — LIDOCAINE-EPINEPHRINE 1 %-1:100000 IJ SOLN
INTRAMUSCULAR | Status: DC | PRN
  Administered 2023-02-26: 14 mL via INTRAMUSCULAR

## 2023-02-26 MED ORDER — LIDOCAINE HCL (CARDIAC) PF 100 MG/5ML IV SOSY
PREFILLED_SYRINGE | INTRAVENOUS | Status: DC | PRN
  Administered 2023-02-26: 100 mg via INTRAVENOUS

## 2023-02-26 MED ORDER — OXYCODONE HCL 5 MG/5ML PO SOLN: 5.0000 mg | Freq: Once | ORAL | Status: AC | PRN

## 2023-02-26 MED ORDER — KETAMINE HCL 10 MG/ML IJ SOLN
INTRAMUSCULAR | Status: DC | PRN
  Administered 2023-02-26: 30 mg via INTRAVENOUS
  Administered 2023-02-26: 20 mg via INTRAVENOUS

## 2023-02-26 MED ORDER — LACTATED RINGERS IV SOLN
INTRAVENOUS | Status: DC | PRN
  Administered 2023-02-26: 3001 mL

## 2023-02-26 MED ORDER — ONDANSETRON HCL 4 MG/2ML IJ SOLN
INTRAMUSCULAR | Status: AC
  Filled 2023-02-26: qty 2

## 2023-02-26 MED ORDER — RINGERS IRRIGATION IR SOLN
Status: DC | PRN
  Administered 2023-02-26: 3000 mL

## 2023-02-26 MED ORDER — BUPIVACAINE HCL (PF) 0.5 % IJ SOLN
INTRAMUSCULAR | Status: AC
  Filled 2023-02-26: qty 30

## 2023-02-26 MED ORDER — LIDOCAINE-EPINEPHRINE 1 %-1:100000 IJ SOLN
INTRAMUSCULAR | Status: AC
  Filled 2023-02-26: qty 1

## 2023-02-26 MED ORDER — ACETAMINOPHEN 500 MG PO TABS: 1000.0000 mg | ORAL_TABLET | Freq: Three times a day (TID) | ORAL | 2 refills | Status: AC

## 2023-02-26 MED ORDER — FENTANYL CITRATE (PF) 100 MCG/2ML IJ SOLN
INTRAMUSCULAR | Status: AC
  Filled 2023-02-26: qty 2

## 2023-02-26 MED ORDER — CHLORHEXIDINE GLUCONATE 0.12 % MT SOLN
15.0000 mL | Freq: Once | OROMUCOSAL | Status: AC
  Administered 2023-02-26: 15 mL via OROMUCOSAL

## 2023-02-26 MED ORDER — EPINEPHRINE PF 1 MG/ML IJ SOLN
INTRAMUSCULAR | Status: AC
  Filled 2023-02-26: qty 1

## 2023-02-26 MED ORDER — KETAMINE HCL 50 MG/5ML IJ SOSY
PREFILLED_SYRINGE | INTRAMUSCULAR | Status: AC
  Filled 2023-02-26: qty 5

## 2023-02-26 MED ORDER — OXYCODONE HCL 5 MG PO TABS
5.0000 mg | ORAL_TABLET | Freq: Once | ORAL | Status: AC | PRN
  Administered 2023-02-26: 5 mg via ORAL

## 2023-02-26 MED ORDER — SODIUM CHLORIDE 0.9 % IV SOLN: INTRAVENOUS | Status: DC

## 2023-02-26 MED ORDER — PROPOFOL 10 MG/ML IV BOLUS
INTRAVENOUS | Status: AC
  Filled 2023-02-26: qty 20

## 2023-02-26 MED ORDER — FENTANYL CITRATE (PF) 100 MCG/2ML IJ SOLN
25.0000 ug | INTRAMUSCULAR | Status: DC | PRN
  Administered 2023-02-26 (×2): 25 ug via INTRAVENOUS

## 2023-02-26 MED ORDER — FENTANYL CITRATE (PF) 100 MCG/2ML IJ SOLN
INTRAMUSCULAR | Status: DC | PRN
  Administered 2023-02-26: 50 ug via INTRAVENOUS
  Administered 2023-02-26: 25 ug via INTRAVENOUS

## 2023-02-26 MED ORDER — CEFAZOLIN SODIUM-DEXTROSE 2-4 GM/100ML-% IV SOLN
INTRAVENOUS | Status: AC
  Filled 2023-02-26: qty 100

## 2023-02-26 MED ORDER — PROPOFOL 10 MG/ML IV BOLUS
INTRAVENOUS | Status: DC | PRN
  Administered 2023-02-26: 150 mg via INTRAVENOUS

## 2023-02-26 MED ORDER — CEFAZOLIN SODIUM-DEXTROSE 2-4 GM/100ML-% IV SOLN
2.0000 g | INTRAVENOUS | Status: AC
  Administered 2023-02-26: 2 g via INTRAVENOUS

## 2023-02-26 MED ORDER — LIDOCAINE HCL (PF) 2 % IJ SOLN
INTRAMUSCULAR | Status: AC
  Filled 2023-02-26: qty 5

## 2023-02-26 MED ORDER — MIDAZOLAM HCL 2 MG/2ML IJ SOLN
INTRAMUSCULAR | Status: DC | PRN
  Administered 2023-02-26: 2 mg via INTRAVENOUS

## 2023-02-26 MED ORDER — ACETAMINOPHEN 10 MG/ML IV SOLN
INTRAVENOUS | Status: AC
  Filled 2023-02-26: qty 100

## 2023-02-26 MED ORDER — OXYCODONE HCL 5 MG PO TABS
ORAL_TABLET | ORAL | Status: AC
  Filled 2023-02-26: qty 1

## 2023-02-26 MED ORDER — ACETAMINOPHEN 10 MG/ML IV SOLN
INTRAVENOUS | Status: DC | PRN
  Administered 2023-02-26: 1000 mg via INTRAVENOUS

## 2023-02-26 MED ORDER — HYDROCODONE-ACETAMINOPHEN 5-325 MG PO TABS: 1.0000 | ORAL_TABLET | ORAL | 0 refills | Status: DC | PRN

## 2023-02-26 MED ORDER — CHLORHEXIDINE GLUCONATE 0.12 % MT SOLN
OROMUCOSAL | Status: AC
  Filled 2023-02-26: qty 15

## 2023-02-26 MED ORDER — MIDAZOLAM HCL 2 MG/2ML IJ SOLN
INTRAMUSCULAR | Status: AC
  Filled 2023-02-26: qty 2

## 2023-02-26 MED ORDER — DEXAMETHASONE SODIUM PHOSPHATE 10 MG/ML IJ SOLN
INTRAMUSCULAR | Status: AC
  Filled 2023-02-26: qty 1

## 2023-02-26 MED ORDER — IBUPROFEN 800 MG PO TABS: 800.0000 mg | ORAL_TABLET | Freq: Three times a day (TID) | ORAL | 0 refills | Status: AC

## 2023-02-26 MED ORDER — ONDANSETRON HCL 4 MG/2ML IJ SOLN
INTRAMUSCULAR | Status: DC | PRN
  Administered 2023-02-26: 4 mg via INTRAVENOUS

## 2023-02-26 MED ORDER — ASPIRIN 325 MG PO TBEC: 325.0000 mg | DELAYED_RELEASE_TABLET | Freq: Every day | ORAL | 0 refills | Status: AC

## 2023-02-26 MED ORDER — DEXAMETHASONE SODIUM PHOSPHATE 10 MG/ML IJ SOLN
INTRAMUSCULAR | Status: DC | PRN
  Administered 2023-02-26: 5 mg via INTRAVENOUS

## 2023-02-26 SURGICAL SUPPLY — 57 items
ADAPTER IRRIG TUBE 2 SPIKE SOL (ADAPTER) ×2 IMPLANT
ADPR TBG 2 SPK PMP STRL ASCP (ADAPTER) ×2
APL PRP STRL LF DISP 70% ISPRP (MISCELLANEOUS) ×1
BLADE FULL RADIUS 3.5 (BLADE) IMPLANT
BLADE SHAVER 4.5X7 STR FR (MISCELLANEOUS) IMPLANT
BLADE SURG SZ11 CARB STEEL (BLADE) ×1 IMPLANT
BNDG CMPR 5X6 CHSV STRCH STRL (GAUZE/BANDAGES/DRESSINGS) ×1
BNDG CMPR 6 X 5 YARDS HK CLSR (GAUZE/BANDAGES/DRESSINGS) ×1
BNDG COHESIVE 6X5 TAN ST LF (GAUZE/BANDAGES/DRESSINGS) ×1 IMPLANT
BNDG ELASTIC 6INX 5YD STR LF (GAUZE/BANDAGES/DRESSINGS) ×1 IMPLANT
BNDG ESMARCH 6 X 12 STRL LF (GAUZE/BANDAGES/DRESSINGS) ×1
BNDG ESMARCH 6X12 STRL LF (GAUZE/BANDAGES/DRESSINGS) ×1 IMPLANT
BUR BR 5.5 WIDE MOUTH (BURR) IMPLANT
CHLORAPREP W/TINT 26 (MISCELLANEOUS) ×1 IMPLANT
COOLER POLAR GLACIER W/PUMP (MISCELLANEOUS) ×1 IMPLANT
COVER LIGHT HANDLE STERIS (MISCELLANEOUS) ×2 IMPLANT
CUFF TOURN SGL QUICK 24 (TOURNIQUET CUFF)
CUFF TOURN SGL QUICK 34 (TOURNIQUET CUFF) ×1
CUFF TRNQT CYL 24X4X16.5-23 (TOURNIQUET CUFF) IMPLANT
CUFF TRNQT CYL 34X4.125X (TOURNIQUET CUFF) IMPLANT
CUFF TRNQT CYL 34X4X40X1 (TOURNIQUET CUFF) IMPLANT
DEVICE SUCT BLK HOLE OR FLOOR (MISCELLANEOUS) ×1 IMPLANT
DRAPE ARTHRO LIMB 89X125 STRL (DRAPES) ×2 IMPLANT
DRAPE IMP U-DRAPE 54X76 (DRAPES) ×1 IMPLANT
ELECT REM PT RETURN 9FT ADLT (ELECTROSURGICAL)
ELECTRODE REM PT RTRN 9FT ADLT (ELECTROSURGICAL) IMPLANT
GAUZE SPONGE 4X4 12PLY STRL (GAUZE/BANDAGES/DRESSINGS) ×1 IMPLANT
GLOVE BIOGEL PI IND STRL 8 (GLOVE) ×1 IMPLANT
GLOVE ORTHO TXT STRL SZ7.5 (GLOVE) ×1 IMPLANT
GLOVE SURG ORTHO 8.0 STRL STRW (GLOVE) ×2 IMPLANT
GOWN STRL REUS W/ TWL LRG LVL3 (GOWN DISPOSABLE) ×1 IMPLANT
GOWN STRL REUS W/ TWL XL LVL3 (GOWN DISPOSABLE) ×2 IMPLANT
GOWN STRL REUS W/TWL LRG LVL3 (GOWN DISPOSABLE) ×1
GOWN STRL REUS W/TWL XL LVL3 (GOWN DISPOSABLE) ×2
IV LACTATED RINGER IRRG 3000ML (IV SOLUTION) ×2
IV LR IRRIG 3000ML ARTHROMATIC (IV SOLUTION) ×4 IMPLANT
KIT TURNOVER KIT A (KITS) ×1 IMPLANT
MANIFOLD NEPTUNE II (INSTRUMENTS) ×2 IMPLANT
MAT ABSORB FLUID 56X50 GRAY (MISCELLANEOUS) ×2 IMPLANT
PACK ARTHROSCOPY KNEE (MISCELLANEOUS) ×1 IMPLANT
PAD ABD DERMACEA PRESS 5X9 (GAUZE/BANDAGES/DRESSINGS) ×2 IMPLANT
PAD WRAPON POLAR KNEE (MISCELLANEOUS) ×1 IMPLANT
PADDING CAST COTTON 6X4 STRL (CAST SUPPLIES) ×1 IMPLANT
SLEEVE REMOTE CONTROL 5X12 (DRAPES) IMPLANT
SUT ETHILON 3-0 FS-10 30 BLK (SUTURE) ×1
SUT MNCRL 4-0 (SUTURE) ×1
SUT MNCRL 4-0 27XMFL (SUTURE) ×1
SUT VIC AB 2-0 CT2 27 (SUTURE) ×1 IMPLANT
SUTURE EHLN 3-0 FS-10 30 BLK (SUTURE) ×1 IMPLANT
SUTURE MNCRL 4-0 27XMF (SUTURE) ×1 IMPLANT
TOWEL OR 17X26 4PK STRL BLUE (TOWEL DISPOSABLE) ×2 IMPLANT
TRAP FLUID SMOKE EVACUATOR (MISCELLANEOUS) ×1 IMPLANT
TUBE SET DOUBLEFLO INFLOW (TUBING) ×1 IMPLANT
TUBE SET DOUBLEFLO OUTFLOW (TUBING) ×1 IMPLANT
WAND WEREWOLF FLOW 90D (MISCELLANEOUS) IMPLANT
WATER STERILE IRR 500ML POUR (IV SOLUTION) ×1 IMPLANT
WRAPON POLAR PAD KNEE (MISCELLANEOUS) ×1

## 2023-02-26 NOTE — Op Note (Addendum)
Operative Note    SURGERY DATE: 02/26/2023   PRE-OP DIAGNOSIS:  1. Right medial meniscus tear 2. Right knee degenerative changes   POST-OP DIAGNOSIS:  1. Right medial meniscus tear 2. Right knee degenerative changes   PROCEDURES:  1. Right knee arthroscopy, partial medial meniscectomy   SURGEON: Rosealee Albee, MD  ASSISTANT: Sonny Dandy, PA   ANESTHESIA: Gen   ESTIMATED BLOOD LOSS: minimal   TOTAL IV FLUIDS: per anesthesia   INDICATION(S):  Manuel Stafford. is a 77 y.o. male with signs and symptoms as well as MRI finding of medial meniscus tear.  He had failed extensive nonoperative management without improvement in his symptoms.  After discussion of risks, benefits, and alternatives to surgery, the patient elected to proceed.   OPERATIVE FINDINGS:    Examination under anesthesia: A careful examination under anesthesia was performed.  Passive range of motion was: Hyperextension: 1.  Extension: 0.  Flexion: 130.  Lachman: normal. Pivot Shift: normal.  Posterior drawer: normal.  Varus stability in full extension: normal.  Varus stability in 30 degrees of flexion: normal.  Valgus stability in full extension: normal.  Valgus stability in 30 degrees of flexion: normal.   Intra-operative findings: A thorough arthroscopic examination of the knee was performed.  The findings are: 1. Suprapatellar pouch: Normal 2. Undersurface of median ridge: Grade 2 degenerative changes 3. Medial patellar facet: Grade 1 softening 4. Lateral patellar facet: Grade 1 softening 5. Trochlea: Grade 3-4 degenerative changes of central trochlea 6. Lateral gutter/popliteus tendon: Normal 7. Hoffa's fat pad: Inflamed 8. Medial gutter/plica: Normal 9. ACL: Normal 10. PCL: Normal 11. Medial meniscus: Complex tear with parrot-beak type tear of the posterior horn with full width radial component with extension along the posterior horn/capsule junction towards the meniscus root 12. Medial compartment  cartilage: Focal area of grade 4 degenerative change with diffuse surrounding grade 2-3 degenerative change to the femoral condyle; grade 1-2 degenerative changes to tibial plateau 13. Lateral meniscus: Normal 14. Lateral compartment cartilage: Grade 1 degenerative changes to the tibial plateau; normal lateral femoral condyle   OPERATIVE REPORT:     I identified Jakevion R Leo Grosser. in the pre-operative holding area. I marked the operative knee with my initials. I reviewed the risks and benefits of the proposed surgical intervention and the patient wished to proceed. The patient was transferred to the operative suite and placed in the supine position with all bony prominences padded.  Anesthesia was administered. Appropriate IV antibiotics were administered prior to incision. The extremity was then prepped and draped in standard fashion. A time out was performed confirming the correct extremity, correct patient, and correct procedure.   Arthroscopy portals were marked. Local anesthetic was injected to the planned portal sites. The anterolateral portal was established with an 11 blade.      The arthroscope was placed in the anterolateral portal and then into the suprapatellar pouch. Next, the medial portal was established under needle localization. A diagnostic knee scope was completed with the above findings. The medial meniscus tear was identified.   The MCL was pie-crusted to improve visualization of the posterior horn. The meniscal tear was debrided using an arthroscopic biter and an oscillating shaver until the meniscus had stable borders.  Arthroscopic fluid was removed from the joint.   The portals were closed with 3-0 Nylon suture. Sterile dressings included Xeroform, 4x4s, Sof-Rol, and Bias wrap. A Polarcare was placed.  The patient was then awakened and taken to the PACU hemodynamically stable  without complication.   Of note, assistance from a PA was essential to performing the surgery.  PA  was present for the entire surgery.  PA assisted with patient positioning, retraction, instrumentation, and wound closure. The surgery would have been more difficult and had longer operative time without PA assistance.     POSTOPERATIVE PLAN: The patient will be discharged home today once they meet PACU criteria. Aspirin 325 mg daily was prescribed for 2 weeks for DVT prophylaxis.  Physical therapy will start on POD#3-4. Weight-bearing as tolerated. Follow up in 2 weeks per protocol.

## 2023-02-26 NOTE — Anesthesia Procedure Notes (Signed)
Procedure Name: Intubation Date/Time: 02/26/2023 11:20 AM  Performed by: Berniece Pap, CRNAPre-anesthesia Checklist: Patient identified, Emergency Drugs available, Suction available and Patient being monitored Patient Re-evaluated:Patient Re-evaluated prior to induction Oxygen Delivery Method: Circle system utilized Preoxygenation: Pre-oxygenation with 100% oxygen Induction Type: IV induction Ventilation: Mask ventilation without difficulty LMA: LMA inserted LMA Size: 5.0 Tube type: Oral Number of attempts: 1 Placement Confirmation: positive ETCO2 and breath sounds checked- equal and bilateral Tube secured with: Tape Dental Injury: Teeth and Oropharynx as per pre-operative assessment

## 2023-02-26 NOTE — Transfer of Care (Signed)
Immediate Anesthesia Transfer of Care Note  Patient: Manuel Stafford.  Procedure(s) Performed: Right knee arthroscopic partial medial meniscectomy (Right: Knee)  Patient Location: PACU  Anesthesia Type:General  Level of Consciousness: awake, alert , and oriented  Airway & Oxygen Therapy: Patient Spontanous Breathing and Patient connected to face mask oxygen  Post-op Assessment: Report given to RN and Post -op Vital signs reviewed and stable  Post vital signs: Reviewed and stable  Last Vitals:  Vitals Value Taken Time  BP 95/55 02/26/23 1217  Temp 36.4 C 02/26/23 1217  Pulse 81 02/26/23 1220  Resp 14 02/26/23 1220  SpO2 97 % 02/26/23 1220  Vitals shown include unfiled device data.  Last Pain:  Vitals:   02/26/23 1042  TempSrc: Temporal  PainSc: 0-No pain         Complications: No notable events documented.

## 2023-02-26 NOTE — Discharge Instructions (Addendum)
Arthroscopic Knee Surgery - Partial Meniscectomy   Post-Op Instructions   1. Bracing or crutches: Crutches will be provided at the time of discharge from the surgery center if you do not already have them.   2. Ice: You may be provided with a device [facility]) that allows you to ice the affected area effectively. Otherwise you can ice manually.    3. Driving:  Plan on not driving for at least two weeks. Please note that you are advised NOT to drive while taking narcotic pain medications as you may be impaired and unsafe to drive.   4. Activity: Ankle pumps several times an hour while awake to prevent blood clots. Weight bearing: as tolerated. Use crutches for as needed (usually ~1 week or less) until pain allows you to ambulate without a limp. Bending and straightening the knee is unlimited. Elevate knee above heart level as much as possible for one week. Avoid standing more than 5 minutes (consecutively) for the first week.  Avoid long distance travel for 2 weeks.  5. Medications:  - You have been provided a prescription for narcotic pain medicine. After surgery, take 1-2 narcotic tablets every 4 hours if needed for severe pain.  - You may take up to 3000mg /day of tylenol (acetaminophen). You can take 1000mg  3x/day. Please check your narcotic. If you have acetaminophen in your narcotic (each tablet will be 325mg ), be careful not to exceed a total of 3000mg /day of acetaminophen.  - A prescription for anti-nausea medication will be provided in case the narcotic medicine or anesthesia causes nausea - take 1 tablet every 6 hours only if nauseated.  - Take ibuprofen 800 mg every 8 hours WITH food to reduce post-operative knee swelling. DO NOT STOP IBUPROFEN POST-OP UNTIL INSTRUCTED TO DO SO at first post-op office visit (10-14 days after surgery). However, please discontinue if you have any abdominal discomfort after taking this.  - Take enteric coated aspirin 325 mg once daily for 2 weeks to prevent  blood clots.    6. Bandages: The physical therapist should change the bandages at the first post-op appointment. If needed, the dressing supplies have been provided to you.   7. Physical Therapy: 1-2 times per week for 6 weeks. Therapy typically starts on post operative Day 3 or 4. You have been provided an order for physical therapy. The therapist will provide home exercises.   8. Work: May do light duty/desk job in approximately 1-2 weeks when off of narcotics, pain is well-controlled, and swelling has decreased. Labor intensive jobs may require 4-6 weeks to return.      9. Post-Op Appointments: Your first post-op appointment will be with Dr. Posey Pronto in approximately 2 weeks time.    If you find that they have not been scheduled please call the Orthopaedic Appointment front desk at [phone].  AMBULATORY SURGERY  DISCHARGE INSTRUCTIONS   The drugs that you were given will stay in your system until tomorrow so for the next 24 hours you should not:  Drive an automobile Make any legal decisions Drink any alcoholic beverage   You may resume regular meals tomorrow.  Today it is better to start with liquids and gradually work up to solid foods.  You may eat anything you prefer, but it is better to start with liquids, then soup and crackers, and gradually work up to solid foods.   Please notify your doctor immediately if you have any unusual bleeding, trouble breathing, redness and pain at the surgery site, drainage, fever,  or pain not relieved by medication.    Additional Instructions:        Please contact your physician with any problems or Same Day Surgery at [phone], Monday through Friday 6 am to 4 pm, or [place] at [facility] number at [phone].  POLAR CARE INFORMATION  [email or web address]  How to use Chili Cold Therapy System?  YouTube   [email or web address]  OPERATING INSTRUCTIONS  Start the product With dry  hands, connect the transformer to the electrical connection located on the top of the cooler. Next, plug the transformer into an appropriate electrical outlet. The unit will automatically start running at this point.  To stop the pump, disconnect electrical power.  Unplug to stop the product when not in use. Unplugging the Polar Care unit turns it off. Always unplug immediately after use. Never leave it plugged in while unattended. Remove pad.    FIRST ADD WATER TO FILL LINE, THEN ICE---Replace ice when existing ice is almost melted  1 Discuss Treatment with your [place] Practitioner and Use Only as Prescribed 2 Apply Insulation Barrier & Cold Therapy Pad 3 Check for Moisture 4 Inspect Skin Regularly  Tips and Trouble Shooting Usage Tips 1. Use cubed or chunked ice for optimal performance. 2. It is recommended to drain the Pad between uses. To drain the pad, hold the Pad upright with the hose pointed toward the ground. Depress the black plunger and allow water to drain out. 3. You may disconnect the Pad from the unit without removing the pad from the affected area by depressing the silver tabs on the hose coupling and gently pulling the hoses apart. The Pad and unit will seal itself and will not leak. Note: Some dripping during release is normal. 4. DO NOT RUN PUMP WITHOUT WATER! The pump in this unit is designed to run with water. Running the unit without water will cause permanent damage to the pump. 5. Unplug unit before removing lid.  TROUBLESHOOTING GUIDE Pump not running, Water not flowing to the pad, Pad is not getting cold 1. Make sure the transformer is plugged into the wall outlet. 2. Confirm that the ice and water are filled to the indicated levels. 3. Make sure there are no kinks in the pad. 4. Gently pull on the blue tube to make sure the tube/pad junction is straight. 5. Remove the pad from the treatment site and ll it while the pad is lying at; then reapply. 6.  Confirm that the pad couplings are securely attached to the unit. Listen for the double clicks (Figure 1) to confirm the pad couplings are securely attached.  Leaks    Note: Some condensation on the lines, controller, and pads is unavoidable, especially in warmer climates. 1. If using a Breg Polar Care Cold Therapy unit with a detachable Cold Therapy Pad, and a leak exists (other than condensation on the lines) disconnect the pad couplings. Make sure the silver tabs on the couplings are depressed before reconnecting the pad to the pump hose; then confirm both sides of the coupling are properly clicked in. 2. If the coupling continues to leak or a leak is detected in the pad itself, stop using it and call [place] at [phone].  Cleaning After use, empty and dry the unit with a soft cloth. Warm water and mild detergent may be used occasionally to clean the pump and tubes.  WARNING: The [place] can be cold enough to cause serious injury,  including full skin necrosis. Follow these Operating Instructions, and carefully read the Product Insert (see pouch on side of unit) and the Cold Therapy Pad Fitting Instructions (provided with each Cold Therapy Pad) prior to use.

## 2023-02-26 NOTE — Telephone Encounter (Signed)
Copied from CRM [id number]. Topic: General - Inquiry >> Feb 22, 2023  3:21 PM De Blanch wrote: Reason for CRM:Pt stated Novo Nordisk accidently mailed his Ozempic to the [facility] office. Asked if the office could call him when they receive it so he can pick it up.  Pt is a pt of Dr.Gillbert.  Please advise.

## 2023-02-26 NOTE — Anesthesia Postprocedure Evaluation (Signed)
Anesthesia Post Note  Patient: Manuel Stafford.  Procedure(s) Performed: Right knee arthroscopic partial medial meniscectomy (Right: Knee)  Patient location during evaluation: PACU Anesthesia Type: General Level of consciousness: awake and alert Pain management: pain level controlled Vital Signs Assessment: post-procedure vital signs reviewed and stable Respiratory status: spontaneous breathing, nonlabored ventilation, respiratory function stable and patient connected to nasal cannula oxygen Cardiovascular status: blood pressure returned to baseline and stable Postop Assessment: no apparent nausea or vomiting Anesthetic complications: no   No notable events documented.   Last Vitals:  Vitals:   02/26/23 1300 02/26/23 1321  BP: 127/70 137/78  Pulse: 70 71  Resp: 11 15  Temp:  (!) 36.3 C  SpO2: 91% 95%    Last Pain:  Vitals:   02/26/23 1321  TempSrc: Oral  PainSc: 4                  Cleda Mccreedy Cynthie Garmon

## 2023-02-26 NOTE — Telephone Encounter (Signed)
To date, no medication had been received for this patient.

## 2023-02-26 NOTE — H&P (Signed)
Paper H&P to be scanned into permanent record. H&P reviewed. No significant changes noted.  

## 2023-02-26 NOTE — Anesthesia Preprocedure Evaluation (Signed)
Anesthesia Evaluation  Patient identified by MRN, date of birth, ID band Patient awake    Reviewed: Allergy & Precautions, NPO status , Patient's Chart, lab work & pertinent test results  History of Anesthesia Complications Negative for: history of anesthetic complications  Airway Mallampati: III  TM Distance: <3 FB Neck ROM: full    Dental  (+) Chipped   Pulmonary neg shortness of breath, sleep apnea , former smoker   Pulmonary exam normal        Cardiovascular Exercise Tolerance: Good hypertension, (-) angina + CAD  (-) Past MI Normal cardiovascular exam+ dysrhythmias      Neuro/Psych  PSYCHIATRIC DISORDERS      negative neurological ROS     GI/Hepatic Neg liver ROS,GERD  Controlled,,  Endo/Other  diabetes, Type 2    Renal/GU Renal disease     Musculoskeletal   Abdominal   Peds  Hematology negative hematology ROS (+)   Anesthesia Other Findings Past Medical History: No date: Anxiety No date: Aortic atherosclerosis (HCC) No date: Cervical osteophyte No date: Chronic lower back pain No date: Coronary artery disease No date: DDD (degenerative disc disease), cervical No date: DDD (degenerative disc disease), lumbar No date: Depression 05/31/2021: Diastolic dysfunction     Comment:  a.) TTE 05/31/2021: EF >55%, mild BAE, mild RVE, triv               TR/PR, mild MR, G1DD. No date: Erectile dysfunction     Comment:  a.) on PDE5i (tadalafil) No date: Fatty liver disease, nonalcoholic No date: GERD (gastroesophageal reflux disease) 2017: Hemorrhoids No date: History of kidney stones No date: Hyperlipidemia No date: Hypertension No date: Hypertensive left ventricular hypertrophy No date: Intermittent complete heart block (HCC) No date: Left inguinal hernia No date: Long term (current) use of aspirin No date: Lumbar spondylolysis No date: Lung nodules No date: Medial meniscus tear No date: Obstructive  sleep apnea treated with BiPAP No date: OSA treated with BiPAP No date: Osteoarthritis No date: Parkinsonian features No date: RBBB (right bundle branch block) No date: Recurrent aspiration pneumonia (HCC) No date: RLS (restless legs syndrome)     Comment:  a.) on ropinirole No date: Sigmoid diverticulosis No date: Stage 3a chronic kidney disease (CKD) (HCC) No date: T2DM (type 2 diabetes mellitus) (HCC) No date: Tremor  Past Surgical History: 04/04/2022: ANTERIOR CERVICAL DECOMP/DISCECTOMY FUSION; N/A     Comment:  Procedure: C3-6 OSTEOPHYTE REMOVAL;  Surgeon: Venetia Night, MD;  Location: [facility] OR[facility];  Service: Neurosurgery;              Laterality: N/A; No date: CIRCUMCISION 2007: COLONOSCOPY 08/22/2016: COLONOSCOPY WITH PROPOFOL; N/A     Comment:  Procedure: COLONOSCOPY WITH PROPOFOL;  Surgeon:               Kieth Brightly, MD;  Location: [facility] END[facility];                Service: Endoscopy;  Laterality: N/A; 09/18/2015: CYSTOSCOPY WITH STENT PLACEMENT; Right     Comment:  Procedure: CYSTOSCOPY WITH STENT PLACEMENT;  Surgeon:               Vanna Scotland, MD;  Location: [facility] OR[facility];  Service:               Urology;  Laterality: Right; 10/03/2015: CYSTOSCOPY WITH URETEROSCOPY, STONE BASKETRY AND STENT  PLACEMENT; Right     Comment:  Procedure: CYSTOSCOPY  WITH URETEROSCOPY, STONE BASKETRY               AND STENT PLACEMENT;  Surgeon: Vanna Scotland, MD;                Location: [facility];  Service: Urology;  Laterality:               Right; 09/15/2015: EXTRACORPOREAL SHOCK WAVE LITHOTRIPSY; Right     Comment:  Procedure: EXTRACORPOREAL SHOCK WAVE LITHOTRIPSY (ESWL);              Surgeon: Hildred Laser, MD;  Location: [facility] OR[facility];                Service: Urology;  Laterality: Right; No date: HERNIA REPAIR 06/2012: ROTATOR CUFF REPAIR; Right     Reproductive/Obstetrics negative OB ROS                             Anesthesia  Physical Anesthesia Plan  ASA: 3  Anesthesia Plan: General LMA   Post-op Pain Management:    Induction: Intravenous  PONV Risk Score and Plan: Dexamethasone, Ondansetron, Midazolam and Treatment may vary due to age or medical condition  Airway Management Planned: LMA  Additional Equipment:   Intra-op Plan:   Post-operative Plan: Extubation in OR  Informed Consent: I have reviewed the patients History and Physical, chart, labs and discussed the procedure including the risks, benefits and alternatives for the proposed anesthesia with the patient or authorized representative who has indicated his/her understanding and acceptance.     Dental Advisory Given  Plan Discussed with: Anesthesiologist, CRNA and Surgeon  Anesthesia Plan Comments: (Patient consented for risks of anesthesia including but not limited to:  - adverse reactions to medications - damage to eyes, teeth, lips or other oral mucosa - nerve damage due to positioning  - sore throat or hoarseness - Damage to heart, brain, nerves, lungs, other parts of body or loss of life  Patient voiced understanding.)       Anesthesia Quick Evaluation

## 2023-02-27 ENCOUNTER — Encounter: Payer: Self-pay | Admitting: Orthopedic Surgery

## 2023-03-01 ENCOUNTER — Other Ambulatory Visit: Payer: Self-pay

## 2023-03-01 DIAGNOSIS — M25561 Pain in right knee: Secondary | ICD-10-CM | POA: Diagnosis not present

## 2023-03-01 DIAGNOSIS — N200 Calculus of kidney: Secondary | ICD-10-CM

## 2023-03-01 DIAGNOSIS — G8929 Other chronic pain: Secondary | ICD-10-CM | POA: Diagnosis not present

## 2023-03-05 ENCOUNTER — Ambulatory Visit: Payer: HMO | Admitting: Urology

## 2023-03-06 DIAGNOSIS — M25561 Pain in right knee: Secondary | ICD-10-CM | POA: Diagnosis not present

## 2023-03-06 DIAGNOSIS — G8929 Other chronic pain: Secondary | ICD-10-CM | POA: Diagnosis not present

## 2023-03-10 DIAGNOSIS — G4731 Primary central sleep apnea: Secondary | ICD-10-CM | POA: Diagnosis not present

## 2023-03-13 ENCOUNTER — Telehealth: Payer: Self-pay | Admitting: Family Medicine

## 2023-03-13 NOTE — Telephone Encounter (Signed)
Pt is calling in wanting to know if his Ozempic had been delivered to the office. Please advise.

## 2023-03-13 NOTE — Telephone Encounter (Signed)
Patient advised we didn't have any Ozempic for him.

## 2023-03-19 DIAGNOSIS — G8929 Other chronic pain: Secondary | ICD-10-CM | POA: Diagnosis not present

## 2023-03-19 DIAGNOSIS — M25561 Pain in right knee: Secondary | ICD-10-CM | POA: Diagnosis not present

## 2023-03-26 DIAGNOSIS — R002 Palpitations: Secondary | ICD-10-CM | POA: Diagnosis not present

## 2023-03-26 DIAGNOSIS — I10 Essential (primary) hypertension: Secondary | ICD-10-CM | POA: Diagnosis not present

## 2023-03-26 DIAGNOSIS — E1122 Type 2 diabetes mellitus with diabetic chronic kidney disease: Secondary | ICD-10-CM | POA: Diagnosis not present

## 2023-03-26 DIAGNOSIS — N5203 Combined arterial insufficiency and corporo-venous occlusive erectile dysfunction: Secondary | ICD-10-CM | POA: Diagnosis not present

## 2023-03-26 DIAGNOSIS — N1831 Chronic kidney disease, stage 3a: Secondary | ICD-10-CM | POA: Diagnosis not present

## 2023-03-26 DIAGNOSIS — G8929 Other chronic pain: Secondary | ICD-10-CM | POA: Diagnosis not present

## 2023-03-26 DIAGNOSIS — M25511 Pain in right shoulder: Secondary | ICD-10-CM | POA: Diagnosis not present

## 2023-03-26 DIAGNOSIS — Z23 Encounter for immunization: Secondary | ICD-10-CM | POA: Diagnosis not present

## 2023-03-26 DIAGNOSIS — I2510 Atherosclerotic heart disease of native coronary artery without angina pectoris: Secondary | ICD-10-CM | POA: Diagnosis not present

## 2023-03-27 DIAGNOSIS — G8929 Other chronic pain: Secondary | ICD-10-CM | POA: Diagnosis not present

## 2023-03-27 DIAGNOSIS — M25561 Pain in right knee: Secondary | ICD-10-CM | POA: Diagnosis not present

## 2023-03-28 ENCOUNTER — Telehealth: Payer: Self-pay | Admitting: Family Medicine

## 2023-03-28 NOTE — Telephone Encounter (Signed)
Called patient to let him know that his Ozempic was delivered and is ready for pick-up. Patient stated that he would pick it up this afternoon.

## 2023-04-09 ENCOUNTER — Ambulatory Visit: Payer: PPO | Admitting: Urology

## 2023-04-09 ENCOUNTER — Ambulatory Visit
Admission: RE | Admit: 2023-04-09 | Discharge: 2023-04-09 | Disposition: A | Discharge status: Home / Self Care | Payer: PPO | Source: Ambulatory Visit | Attending: Urology | Admitting: Urology

## 2023-04-09 ENCOUNTER — Ambulatory Visit
Admission: RE | Admit: 2023-04-09 | Discharge: 2023-04-09 | Disposition: A | Discharge status: Home / Self Care | Payer: PPO | Attending: Urology | Admitting: Urology

## 2023-04-09 VITALS — BP 152/74 | HR 76 | Ht 72.0 in | Wt 238.0 lb

## 2023-04-09 DIAGNOSIS — G8929 Other chronic pain: Secondary | ICD-10-CM | POA: Diagnosis not present

## 2023-04-09 DIAGNOSIS — N528 Other male erectile dysfunction: Secondary | ICD-10-CM

## 2023-04-09 DIAGNOSIS — R350 Frequency of micturition: Secondary | ICD-10-CM

## 2023-04-09 DIAGNOSIS — N401 Enlarged prostate with lower urinary tract symptoms: Secondary | ICD-10-CM | POA: Diagnosis not present

## 2023-04-09 DIAGNOSIS — N200 Calculus of kidney: Secondary | ICD-10-CM

## 2023-04-09 DIAGNOSIS — G4731 Primary central sleep apnea: Secondary | ICD-10-CM | POA: Diagnosis not present

## 2023-04-09 DIAGNOSIS — R3915 Urgency of urination: Secondary | ICD-10-CM

## 2023-04-09 DIAGNOSIS — M25561 Pain in right knee: Secondary | ICD-10-CM | POA: Diagnosis not present

## 2023-04-09 LAB — BLADDER SCAN AMB NON-IMAGING: Scan Result: 22

## 2023-04-09 NOTE — Progress Notes (Signed)
I,[name],acting as a scribe for Manuel Scotland, MD.,have documented all relevant documentation on the behalf of Manuel Scotland, MD,as directed by  Manuel Scotland, MD while in the presence of Manuel Scotland, MD.  04/09/2023 11:45 AM   Manuel Stafford. 1945/09/06 [id number]  Referring provider: Bosie Clos, MD [address] [place],  [place] [address]  Chief Complaint  Patient presents with   Nephrolithiasis    HPI: 77 year-old male with a personal history of nephrolithitis, erectile dysfunction, and irritative urinary symptoms, and kidney stones presents today for routine annual follow-up with KUB.   He last underwent ureteroscopy in 2017.  He uses Tadalafil as needed for erectile dysfunction .  He underwent cystoscopy last year for further evaluation of his prostate anatomy. He ultimately ended up being treated with [facility] and had an excellent result.  KUB today shows a punctate 3 mm left lower pole kidney stone, unchanged from last year. No new stones.   He states he is no longer taking Gemtesa. He is not having urinary symptoms anymore. He only takes Flomax.   He is in a new relationship and is wondering it there is something to use besides Viagra or Cialis.    IPSS     Row Name 04/09/23 1000         International Prostate Symptom Score   How often have you had the sensation of not emptying your bladder? Not at All     How often have you had to urinate less than every two hours? Less than half the time     How often have you found you stopped and started again several times when you urinated? Not at All     How often have you found it difficult to postpone urination? Less than half the time     How often have you had a weak urinary stream? Less than 1 in 5 times     How often have you had to strain to start urination? Not at All     How many times did you typically get up at night to urinate? 2 Times     Total IPSS Score 7       Quality of Life  due to urinary symptoms   If you were to spend the rest of your life with your urinary condition just the way it is now how would you feel about that? Mostly Satisfied            Score:  1-7 Mild 8-19 Moderate 20-35 Severe Results for orders placed or performed in visit on 04/09/23  Bladder Scan (Post Void Residual) in office  Result Value Ref Range   Scan Result 22 ml     PMH: Past Medical History:  Diagnosis Date   Anxiety    Aortic atherosclerosis (HCC)    Cervical osteophyte    Chronic lower back pain    Coronary artery disease    DDD (degenerative disc disease), cervical    DDD (degenerative disc disease), lumbar    Depression    Diastolic dysfunction 05/31/2021   a.) TTE 05/31/2021: EF >55%, mild BAE, mild RVE, triv TR/PR, mild MR, G1DD.   Erectile dysfunction    a.) on PDE5i (tadalafil)   Fatty liver disease, nonalcoholic    GERD (gastroesophageal reflux disease)    Hemorrhoids 2017   History of kidney stones    Hyperlipidemia    Hypertension    Hypertensive left ventricular hypertrophy    Intermittent  complete heart block (HCC)    Left inguinal hernia    Long term (current) use of aspirin    Lumbar spondylolysis    Lung nodules    Medial meniscus tear    Obstructive sleep apnea treated with BiPAP    OSA treated with BiPAP    Osteoarthritis    Parkinsonian features    RBBB (right bundle branch block)    Recurrent aspiration pneumonia (HCC)    RLS (restless legs syndrome)    a.) on ropinirole   Sigmoid diverticulosis    Stage 3a chronic kidney disease (CKD) (HCC)    T2DM (type 2 diabetes mellitus) (HCC)    Tremor     Surgical History: Past Surgical History:  Procedure Laterality Date   ANTERIOR CERVICAL DECOMP/DISCECTOMY FUSION N/A 04/04/2022   Procedure: C3-6 OSTEOPHYTE REMOVAL;  Surgeon: Venetia Night, MD;  Location: [facility];  Service: Neurosurgery;  Laterality: N/A;   CIRCUMCISION     COLONOSCOPY  2007   COLONOSCOPY WITH PROPOFOL N/A  08/22/2016   Procedure: COLONOSCOPY WITH PROPOFOL;  Surgeon: Kieth Brightly, MD;  Location: [facility];  Service: Endoscopy;  Laterality: N/A;   CYSTOSCOPY WITH STENT PLACEMENT Right 09/18/2015   Procedure: CYSTOSCOPY WITH STENT PLACEMENT;  Surgeon: Manuel Scotland, MD;  Location: [facility];  Service: Urology;  Laterality: Right;   CYSTOSCOPY WITH URETEROSCOPY, STONE BASKETRY AND STENT PLACEMENT Right 10/03/2015   Procedure: CYSTOSCOPY WITH URETEROSCOPY, STONE BASKETRY AND STENT PLACEMENT;  Surgeon: Manuel Scotland, MD;  Location: [facility];  Service: Urology;  Laterality: Right;   EXTRACORPOREAL SHOCK WAVE LITHOTRIPSY Right 09/15/2015   Procedure: EXTRACORPOREAL SHOCK WAVE LITHOTRIPSY (ESWL);  Surgeon: Hildred Laser, MD;  Location: [facility];  Service: Urology;  Laterality: Right;   HERNIA REPAIR     KNEE ARTHROSCOPY WITH MEDIAL MENISECTOMY Right 02/26/2023   Procedure: Right knee arthroscopic partial medial meniscectomy;  Surgeon: Signa Kell, MD;  Location: [facility];  Service: Orthopedics;  Laterality: Right;   ROTATOR CUFF REPAIR Right 06/2012    Home Medications:  Allergies as of 04/09/2023   No Known Allergies      Medication List        Accurate as of April 09, 2023 11:45 AM. If you have any questions, ask your nurse or doctor.          STOP taking these medications    HYDROcodone-acetaminophen 5-325 MG tablet Commonly known as: Norco   meclizine 25 MG tablet Commonly known as: ANTIVERT       TAKE these medications    acetaminophen 500 MG tablet Commonly known as: TYLENOL Take 2 tablets (1,000 mg total) by mouth every 8 (eight) hours.   aspirin 81 MG tablet Take 81 mg by mouth daily.   FreeStyle Libre 2 Sensor Misc USE AS DIRECTED. CONTINUOUS BLOOD SENSOR.   hydrochlorothiazide 25 MG tablet Commonly known as: HYDRODIURIL TAKE 1 TABLET BY MOUTH EVERY DAY What changed: when to take this   ketoconazole 2 % cream Commonly known as: NIZORAL Apply 1  Application topically at bedtime. What changed: additional instructions   metFORMIN 1000 MG tablet Commonly known as: GLUCOPHAGE TAKE 1 TABLET (1,000 MG TOTAL) BY MOUTH 2 (TWO) TIMES DAILY WITH A MEAL.   MULTIPLE VITAMIN PO Take 1 tablet by mouth daily.   Ozempic (1 MG/DOSE) 2 MG/1.5ML Sopn Generic drug: Semaglutide (1 MG/DOSE) Inject 1 mg into the skin once a week. Thursdays   pantoprazole 40 MG tablet Commonly known as: PROTONIX TAKE 1 TABLET BY MOUTH EVERY  DAY What changed: when to take this   rosuvastatin 20 MG tablet Commonly known as: CRESTOR TAKE 1 TABLET BY MOUTH EVERY DAY What changed: when to take this   Saw Palmetto Berry 160 MG Caps Take 2 tablets by mouth daily.   tadalafil 5 MG tablet Commonly known as: CIALIS Take 1 tablet (5 mg total) by mouth daily. What changed:  when to take this reasons to take this   tamsulosin 0.4 MG Caps capsule Commonly known as: FLOMAX TAKE 1 CAPSULE BY MOUTH EVERY DAY What changed: when to take this   telmisartan 40 MG tablet Commonly known as: MICARDIS TAKE 1 TABLET BY MOUTH EVERY DAY What changed: when to take this   tiZANidine 4 MG tablet Commonly known as: ZANAFLEX TAKE 1 TABLET BY MOUTH EVERYDAY AT BEDTIME What changed: See the new instructions.   Vitamin D3 50 MCG (2000 UT) capsule Take 2,000 Units by mouth daily.        Family History: Family History  Problem Relation Age of Onset   Hypertension Mother    Diabetes Father    CAD Father    Diabetes Sister    Heart disease Sister    CAD Sister     Social History:  reports that he quit smoking about 48 years ago. His smoking use included cigarettes. He started smoking about 63 years ago. He has a 30 pack-year smoking history. He has never used smokeless tobacco. He reports current alcohol use. He reports that he does not use drugs.   Physical Exam: BP (!) 152/74   Pulse 76   Ht 6' (1.829 m)   Wt 238 lb (108 kg)   BMI 32.28 kg/m   Constitutional:   Alert and oriented, No acute distress. HEENT: [place] AT, moist mucus membranes.  Trachea midline, no masses. Neurologic: Grossly intact, no focal deficits, moving all 4 extremities. Psychiatric: Normal mood and affect.   Pertinent Imaging: KUB today. Images personally reviewed and compared with previous. Awaiting final radiologic interpretation.    Assessment & Plan:    1. Nephrolithiasis  - KUB reviewed today. Overall stone burden is stable. Encouraged hydration and generalized stone prevention techniques. No further imaging needed next year unless he becomes symptomatic.   2. Urinary urgency  - No longer taking Gemtesa. Feels this symptom has resolved. However, he is still taking Flomax for his underlying BPH.  3. Erectile dysfunction  - Continue Tadalafil as needed. Reviewed the reasons why it may decrease in effectiveness over time including diabetes, damage to small blood vessels, etc. Went over instructions for maximum results including a 5 mg daily and then a 20 mg on demand dose on an empty stomach.   - Discussed options of using Trimix injections, penile pumps, and a penile implant. Explained for the implant procedure would refer to Dr. Lafonda Mosses. If he wants to pursue this further he will let us know.  He is not particularly interested in injections.  Will try to optimize the above if this fails, may consider implant.  Return in about 1 year (around 04/08/2024) for IPSS, PVR with Carollee Herter.  I have reviewed the above documentation for accuracy and completeness, and I agree with the above.   Manuel Scotland, MD    [facility] [address], [address] [place], [place] [address] [phone]  I spent 32 total minutes on the day of the encounter including pre-visit review of the medical record, face-to-face time with the patient, and post visit ordering of labs/imaging/tests.

## 2023-04-10 ENCOUNTER — Encounter: Payer: Self-pay | Admitting: Urology

## 2023-04-10 DIAGNOSIS — F5232 Male orgasmic disorder: Secondary | ICD-10-CM

## 2023-04-10 DIAGNOSIS — N200 Calculus of kidney: Secondary | ICD-10-CM

## 2023-04-10 DIAGNOSIS — N528 Other male erectile dysfunction: Secondary | ICD-10-CM

## 2023-04-10 MED ORDER — TADALAFIL 20 MG PO TABS: 20.0000 mg | ORAL_TABLET | Freq: Every day | ORAL | 11 refills | Status: DC | PRN

## 2023-04-10 MED ORDER — TADALAFIL 5 MG PO TABS
5.0000 mg | ORAL_TABLET | Freq: Every day | ORAL | 11 refills | Status: DC
Start: 2023-04-10 — End: 2024-04-22

## 2023-04-16 DIAGNOSIS — M25561 Pain in right knee: Secondary | ICD-10-CM | POA: Diagnosis not present

## 2023-04-16 DIAGNOSIS — G8929 Other chronic pain: Secondary | ICD-10-CM | POA: Diagnosis not present

## 2023-04-24 DIAGNOSIS — G8929 Other chronic pain: Secondary | ICD-10-CM | POA: Diagnosis not present

## 2023-04-24 DIAGNOSIS — M25561 Pain in right knee: Secondary | ICD-10-CM | POA: Diagnosis not present

## 2023-04-30 DIAGNOSIS — M25561 Pain in right knee: Secondary | ICD-10-CM | POA: Diagnosis not present

## 2023-04-30 DIAGNOSIS — G8929 Other chronic pain: Secondary | ICD-10-CM | POA: Diagnosis not present

## 2023-05-02 DIAGNOSIS — E538 Deficiency of other specified B group vitamins: Secondary | ICD-10-CM | POA: Diagnosis not present

## 2023-05-02 DIAGNOSIS — E559 Vitamin D deficiency, unspecified: Secondary | ICD-10-CM | POA: Diagnosis not present

## 2023-05-03 DIAGNOSIS — G4731 Primary central sleep apnea: Secondary | ICD-10-CM | POA: Diagnosis not present

## 2023-05-07 DIAGNOSIS — G8929 Other chronic pain: Secondary | ICD-10-CM | POA: Diagnosis not present

## 2023-05-07 DIAGNOSIS — M25561 Pain in right knee: Secondary | ICD-10-CM | POA: Diagnosis not present

## 2023-05-08 ENCOUNTER — Other Ambulatory Visit: Payer: Self-pay | Admitting: Physician Assistant

## 2023-05-08 DIAGNOSIS — E1169 Type 2 diabetes mellitus with other specified complication: Secondary | ICD-10-CM

## 2023-05-10 DIAGNOSIS — G4731 Primary central sleep apnea: Secondary | ICD-10-CM | POA: Diagnosis not present

## 2023-05-13 ENCOUNTER — Other Ambulatory Visit: Payer: Self-pay | Admitting: Physician Assistant

## 2023-05-13 DIAGNOSIS — E1169 Type 2 diabetes mellitus with other specified complication: Secondary | ICD-10-CM

## 2023-05-15 DIAGNOSIS — R3129 Other microscopic hematuria: Secondary | ICD-10-CM | POA: Diagnosis not present

## 2023-05-15 DIAGNOSIS — K219 Gastro-esophageal reflux disease without esophagitis: Secondary | ICD-10-CM | POA: Diagnosis not present

## 2023-05-15 DIAGNOSIS — K759 Inflammatory liver disease, unspecified: Secondary | ICD-10-CM | POA: Diagnosis not present

## 2023-05-15 DIAGNOSIS — N1339 Other hydronephrosis: Secondary | ICD-10-CM | POA: Diagnosis not present

## 2023-05-15 DIAGNOSIS — N1832 Chronic kidney disease, stage 3b: Secondary | ICD-10-CM | POA: Diagnosis not present

## 2023-05-15 DIAGNOSIS — E669 Obesity, unspecified: Secondary | ICD-10-CM | POA: Diagnosis not present

## 2023-05-15 DIAGNOSIS — G4733 Obstructive sleep apnea (adult) (pediatric): Secondary | ICD-10-CM | POA: Diagnosis not present

## 2023-05-15 DIAGNOSIS — N200 Calculus of kidney: Secondary | ICD-10-CM | POA: Diagnosis not present

## 2023-05-15 DIAGNOSIS — N400 Enlarged prostate without lower urinary tract symptoms: Secondary | ICD-10-CM | POA: Diagnosis not present

## 2023-05-15 DIAGNOSIS — R809 Proteinuria, unspecified: Secondary | ICD-10-CM | POA: Diagnosis not present

## 2023-05-15 DIAGNOSIS — E1122 Type 2 diabetes mellitus with diabetic chronic kidney disease: Secondary | ICD-10-CM | POA: Diagnosis not present

## 2023-05-15 DIAGNOSIS — E785 Hyperlipidemia, unspecified: Secondary | ICD-10-CM | POA: Diagnosis not present

## 2023-05-21 DIAGNOSIS — G8929 Other chronic pain: Secondary | ICD-10-CM | POA: Diagnosis not present

## 2023-05-21 DIAGNOSIS — M25561 Pain in right knee: Secondary | ICD-10-CM | POA: Diagnosis not present

## 2023-05-27 ENCOUNTER — Other Ambulatory Visit: Payer: Self-pay | Admitting: Physician Assistant

## 2023-05-27 ENCOUNTER — Other Ambulatory Visit: Payer: Self-pay | Admitting: Dermatology

## 2023-05-27 DIAGNOSIS — B353 Tinea pedis: Secondary | ICD-10-CM

## 2023-05-28 DIAGNOSIS — G8929 Other chronic pain: Secondary | ICD-10-CM | POA: Diagnosis not present

## 2023-05-28 DIAGNOSIS — M25561 Pain in right knee: Secondary | ICD-10-CM | POA: Diagnosis not present

## 2023-05-29 ENCOUNTER — Ambulatory Visit: Payer: HMO | Admitting: Dermatology

## 2023-06-07 DIAGNOSIS — N1832 Chronic kidney disease, stage 3b: Secondary | ICD-10-CM | POA: Diagnosis not present

## 2023-06-07 DIAGNOSIS — I700 Atherosclerosis of aorta: Secondary | ICD-10-CM | POA: Diagnosis not present

## 2023-06-07 DIAGNOSIS — I10 Essential (primary) hypertension: Secondary | ICD-10-CM | POA: Diagnosis not present

## 2023-06-07 DIAGNOSIS — E782 Mixed hyperlipidemia: Secondary | ICD-10-CM | POA: Diagnosis not present

## 2023-06-07 DIAGNOSIS — N1831 Chronic kidney disease, stage 3a: Secondary | ICD-10-CM | POA: Diagnosis not present

## 2023-06-07 DIAGNOSIS — K760 Fatty (change of) liver, not elsewhere classified: Secondary | ICD-10-CM | POA: Diagnosis not present

## 2023-06-07 DIAGNOSIS — E1122 Type 2 diabetes mellitus with diabetic chronic kidney disease: Secondary | ICD-10-CM | POA: Diagnosis not present

## 2023-06-09 DIAGNOSIS — G4731 Primary central sleep apnea: Secondary | ICD-10-CM | POA: Diagnosis not present

## 2023-08-01 ENCOUNTER — Ambulatory Visit: Payer: HMO | Admitting: Dermatology

## 2023-08-16 ENCOUNTER — Other Ambulatory Visit: Payer: Self-pay | Admitting: Physician Assistant

## 2023-09-10 ENCOUNTER — Other Ambulatory Visit: Payer: Self-pay | Admitting: Physician Assistant

## 2023-09-11 ENCOUNTER — Ambulatory Visit: Payer: HMO | Admitting: Dermatology

## 2023-10-14 ENCOUNTER — Ambulatory Visit: Admitting: Dermatology

## 2023-10-14 ENCOUNTER — Encounter: Payer: Self-pay | Admitting: Dermatology

## 2023-10-14 ENCOUNTER — Other Ambulatory Visit: Payer: Self-pay | Admitting: Urology

## 2023-10-14 DIAGNOSIS — C4491 Basal cell carcinoma of skin, unspecified: Secondary | ICD-10-CM

## 2023-10-14 DIAGNOSIS — W908XXA Exposure to other nonionizing radiation, initial encounter: Secondary | ICD-10-CM

## 2023-10-14 DIAGNOSIS — Z1283 Encounter for screening for malignant neoplasm of skin: Secondary | ICD-10-CM

## 2023-10-14 DIAGNOSIS — Z79899 Other long term (current) drug therapy: Secondary | ICD-10-CM

## 2023-10-14 DIAGNOSIS — L570 Actinic keratosis: Secondary | ICD-10-CM

## 2023-10-14 DIAGNOSIS — B353 Tinea pedis: Secondary | ICD-10-CM

## 2023-10-14 DIAGNOSIS — D1801 Hemangioma of skin and subcutaneous tissue: Secondary | ICD-10-CM

## 2023-10-14 DIAGNOSIS — Z7189 Other specified counseling: Secondary | ICD-10-CM

## 2023-10-14 DIAGNOSIS — D485 Neoplasm of uncertain behavior of skin: Secondary | ICD-10-CM

## 2023-10-14 DIAGNOSIS — L578 Other skin changes due to chronic exposure to nonionizing radiation: Secondary | ICD-10-CM

## 2023-10-14 DIAGNOSIS — C44719 Basal cell carcinoma of skin of left lower limb, including hip: Secondary | ICD-10-CM | POA: Diagnosis not present

## 2023-10-14 DIAGNOSIS — N200 Calculus of kidney: Secondary | ICD-10-CM

## 2023-10-14 DIAGNOSIS — D229 Melanocytic nevi, unspecified: Secondary | ICD-10-CM

## 2023-10-14 DIAGNOSIS — L711 Rhinophyma: Secondary | ICD-10-CM

## 2023-10-14 DIAGNOSIS — L821 Other seborrheic keratosis: Secondary | ICD-10-CM

## 2023-10-14 DIAGNOSIS — L719 Rosacea, unspecified: Secondary | ICD-10-CM

## 2023-10-14 DIAGNOSIS — L814 Other melanin hyperpigmentation: Secondary | ICD-10-CM

## 2023-10-14 DIAGNOSIS — D492 Neoplasm of unspecified behavior of bone, soft tissue, and skin: Secondary | ICD-10-CM

## 2023-10-14 HISTORY — DX: Basal cell carcinoma of skin, unspecified: C44.91

## 2023-10-14 MED ORDER — KETOCONAZOLE 2 % EX CREA: 1.0000 | TOPICAL_CREAM | Freq: Every day | CUTANEOUS | 4 refills | Status: DC

## 2023-10-14 NOTE — Progress Notes (Signed)
 Follow-Up Visit   Subjective  Manuel Stafford. is a 78 y.o. male who presents for the following: Skin Cancer Screening and Full Body Skin Exam  The patient presents for Total-Body Skin Exam (TBSE) for skin cancer screening and mole check. The patient has spots, moles and lesions to be evaluated, some may be new or changing and the patient may have concern these could be cancer.   The following portions of the chart were reviewed this encounter and updated as appropriate: medications, allergies, medical history  Review of Systems:  No other skin or systemic complaints except as noted in HPI or Assessment and Plan.  Objective  Well appearing patient in no apparent distress; mood and affect are within normal limits.  A full examination was performed including scalp, head, eyes, ears, nose, lips, neck, chest, axillae, abdomen, back, buttocks, bilateral upper extremities, bilateral lower extremities, hands, feet, fingers, toes, fingernails, and toenails. All findings within normal limits unless otherwise noted below.   Relevant physical exam findings are noted in the Assessment and Plan.  R ear mid anti helix x 1 Erythematous thin papules/macules with gritty scale.  L med ankle 1.5 x 1.0 cm red patch with hyperkeratosis   Assessment & Plan   SKIN CANCER SCREENING PERFORMED TODAY.  ACTINIC DAMAGE - Chronic condition, secondary to cumulative UV/sun exposure - diffuse scaly erythematous macules with underlying dyspigmentation - Recommend daily broad spectrum sunscreen SPF 30+ to sun-exposed areas, reapply every 2 hours as needed.  - Staying in the shade or wearing long sleeves, sun glasses (UVA+UVB protection) and wide brim hats (4-inch brim around the entire circumference of the hat) are also recommended for sun protection.  - Call for new or changing lesions.  LENTIGINES, SEBORRHEIC KERATOSES, HEMANGIOMAS - Benign normal skin lesions - Benign-appearing - Call for any  changes  MELANOCYTIC NEVI - Tan-brown and/or pink-flesh-colored symmetric macules and papules - Benign appearing on exam today - Observation - Call clinic for new or changing moles - Recommend daily use of broad spectrum spf 30+ sunscreen to sun-exposed areas.   ROSACEA with telangiectasias and rhinophyma Exam Mid face erythema with telangiectasias +/- scattered inflammatory papules Chronic and persistent condition with duration or expected duration over one year. Condition is symptomatic/ bothersome to patient. Not currently at goal. Rosacea is a chronic progressive skin condition usually affecting the face of adults, causing redness and/or acne bumps. It is treatable but not curable. It sometimes affects the eyes (ocular rosacea) as well. It may respond to topical and/or systemic medication and can flare with stress, sun exposure, alcohol, exercise, topical steroids (including hydrocortisone /cortisone 10) and some foods.  Daily application of broad spectrum spf 30+ sunscreen to face is recommended to reduce flares. Treatment Plan Counseling for BBL / IPL / Laser and Coordination of Care Discussed the treatment option of Broad Band Light (BBL) /Intense Pulsed Light (IPL)/ Laser for skin discoloration, including brown spots and redness.  Typically we recommend at least 1-3 treatment sessions about 5-8 weeks apart for best results.  Cannot have tanned skin when BBL performed, and regular use of sunscreen/photoprotection is advised after the procedure to help maintain results. The patient's condition may also require "maintenance treatments" in the future.  The fee for BBL / laser treatments is $350 per treatment session for the whole face.  A fee can be quoted for other parts of the body.  Insurance typically does not pay for BBL/laser treatments and therefore the fee is an out-of-pocket cost. Recommend prophylactic  valtrex  treatment. Once scheduled for procedure, will send Rx in prior to patient's  appointment.    EPIDERMAL INCLUSION CYST vs dermatofibroma vs nevus Exam: Subcutaneous nodule at 0.5 cm R mastoid Benign-appearing. Exam most consistent with an epidermal inclusion cyst. Discussed that a cyst is a benign growth that can grow over time and sometimes get irritated or inflamed. Recommend observation if it is not bothersome. Discussed option of surgical excision to remove it if it is growing, symptomatic, or other changes noted. Please call for new or changing lesions so they can be evaluated.  Purpura - Chronic; persistent and recurrent.  Treatable, but not curable. - Violaceous macules and patches - Benign - Related to trauma, age, sun damage and/or use of blood thinners, chronic use of topical and/or oral steroids - Observe - Can use OTC arnica containing moisturizer such as Dermend Bruise Formula if desired - Call for worsening or other concerns  AK (ACTINIC KERATOSIS) R ear mid anti helix x 1 Actinic keratoses are precancerous spots that appear secondary to cumulative UV radiation exposure/sun exposure over time. They are chronic with expected duration over 1 year. A portion of actinic keratoses will progress to squamous cell carcinoma of the skin. It is not possible to reliably predict which spots will progress to skin cancer and so treatment is recommended to prevent development of skin cancer.  Recommend daily broad spectrum sunscreen SPF 30+ to sun-exposed areas, reapply every 2 hours as needed.  Recommend staying in the shade or wearing long sleeves, sun glasses (UVA+UVB protection) and wide brim hats (4-inch brim around the entire circumference of the hat). Call for new or changing lesions.  Destruction of lesion - R ear mid anti helix x 1 Complexity: simple   Destruction method: cryotherapy   Informed consent: discussed and consent obtained   Timeout:  patient name, date of birth, surgical site, and procedure verified Lesion destroyed using liquid nitrogen: Yes    Region frozen until ice ball extended beyond lesion: Yes   Outcome: patient tolerated procedure well with no complications   Post-procedure details: wound care instructions given   NEOPLASM OF UNCERTAIN BEHAVIOR OF SKIN L med ankle Epidermal / dermal shaving  Lesion diameter (cm):  1.5 Informed consent: discussed and consent obtained   Timeout: patient name, date of birth, surgical site, and procedure verified   Procedure prep:  Patient was prepped and draped in usual sterile fashion Prep type:  Isopropyl alcohol Anesthesia: the lesion was anesthetized in a standard fashion   Anesthetic:  1% lidocaine  w/ epinephrine  1-100,000 buffered w/ 8.4% NaHCO3 Instrument used: flexible razor blade   Hemostasis achieved with: pressure, aluminum chloride and electrodesiccation   Outcome: patient tolerated procedure well   Post-procedure details: sterile dressing applied and wound care instructions given   Dressing type: bandage (Mupirocin 2% ointment)    Destruction of lesion Complexity: extensive   Destruction method: electrodesiccation and curettage   Informed consent: discussed and consent obtained   Timeout:  patient name, date of birth, surgical site, and procedure verified Procedure prep:  Patient was prepped and draped in usual sterile fashion Prep type:  Isopropyl alcohol Anesthesia: the lesion was anesthetized in a standard fashion   Anesthetic:  1% lidocaine  w/ epinephrine  1-100,000 buffered w/ 8.4% NaHCO3 Curettage performed in three different directions: Yes   Electrodesiccation performed over the curetted area: Yes   Lesion length (cm):  1.5 Lesion width (cm):  1.5 Margin per side (cm):  0.2 Final wound size (cm):  1.9 Hemostasis  achieved with:  pressure, aluminum chloride and electrodesiccation Outcome: patient tolerated procedure well with no complications   Post-procedure details: sterile dressing applied and wound care instructions given   Dressing type: bandage and  petrolatum   Specimen 1 - Surgical pathology Differential Diagnosis: D48.5 r/o ISK vs dermatofibroma vs SCC vs other  ED&C today Check Margins: No TINEA PEDIS OF BOTH FEET   Related Medications ketoconazole  (NIZORAL ) 2 % cream Apply 1 Application topically at bedtime. feet SKIN CANCER SCREENING   ACTINIC SKIN DAMAGE   LENTIGO   MELANOCYTIC NEVUS, UNSPECIFIED LOCATION   ROSACEA   COUNSELING AND COORDINATION OF CARE   MEDICATION MANAGEMENT    TINEA PEDIS Exam: Scaling and maceration web spaces and over distal and lateral soles. Chronic and persistent condition with duration or expected duration over one year. Condition is symptomatic / bothersome to patient. Not to goal. Treatment Plan: Start Ketoconazole  2% cream QHS.  Return in about 1 year (around 10/13/2024) for TBSE.  Arlinda Lais, CMA, am acting as scribe for Celine Collard, MD .  Documentation: I have reviewed the above documentation for accuracy and completeness, and I agree with the above.  Celine Collard, MD

## 2023-10-14 NOTE — Patient Instructions (Addendum)

## 2023-10-21 LAB — SURGICAL PATHOLOGY

## 2023-10-22 ENCOUNTER — Encounter: Payer: Self-pay | Admitting: Dermatology

## 2023-10-22 ENCOUNTER — Telehealth: Payer: Self-pay

## 2023-10-22 NOTE — Telephone Encounter (Signed)
-----   Message from Celine Collard sent at 10/22/2023  1:18 PM EDT ----- FINAL DIAGNOSIS        1. Skin, L med ankle :       BASAL CELL CARCINOMA, SUPERFICIAL AND NODULAR PATTERNS   Cancer = BCC Already treated Recheck next visit

## 2023-10-22 NOTE — Telephone Encounter (Signed)
 Advised pt of bx results/sh ?

## 2023-11-05 ENCOUNTER — Other Ambulatory Visit: Payer: Self-pay | Admitting: Physician Assistant

## 2023-12-10 ENCOUNTER — Other Ambulatory Visit: Payer: Self-pay | Admitting: Physician Assistant

## 2024-01-16 ENCOUNTER — Other Ambulatory Visit: Payer: Self-pay

## 2024-02-19 ENCOUNTER — Telehealth: Payer: Self-pay

## 2024-02-19 NOTE — Telephone Encounter (Signed)
 Copied from CRM #[id number]. Topic: General - Other >> Feb 18, 2024  1:44 PM Carlatta H wrote: Reason for CRM: [employer] will be faxing form for medical necessity//

## 2024-04-06 NOTE — Progress Notes (Signed)
 Error

## 2024-04-07 ENCOUNTER — Ambulatory Visit: Payer: Self-pay | Admitting: Urology

## 2024-04-07 DIAGNOSIS — N200 Calculus of kidney: Secondary | ICD-10-CM

## 2024-04-07 DIAGNOSIS — N529 Male erectile dysfunction, unspecified: Secondary | ICD-10-CM

## 2024-04-07 DIAGNOSIS — N138 Other obstructive and reflux uropathy: Secondary | ICD-10-CM

## 2024-04-07 DIAGNOSIS — N401 Enlarged prostate with lower urinary tract symptoms: Secondary | ICD-10-CM

## 2024-04-13 ENCOUNTER — Telehealth: Payer: Self-pay | Admitting: Urology

## 2024-04-13 NOTE — Telephone Encounter (Signed)
 Pt called wanting to talk to the office manager to make a complaint.

## 2024-04-15 NOTE — Telephone Encounter (Signed)
 Pt states he had as appt 10/14 with SM.  He waited 35 minutes after his appt time with no updates from staff. He states staff were busy discussing their benefits than taking care of the pts.   He arrived at 69 for a 940 appt. At 10 am he let the front desk know that he was unable to wait any longer. Per pt front desk said ok. Have a nice day.   Pt states he loves SM. She is the best PA and ultimately he was upset that he did not get to see her.    I apologized for the delay in contacting the pt. I made him aware that I have been in/out of the office over the last few days.  I certainly understand his frustrations. We could have done a better job of keeping him informed. I thanked him for his feedback and was able to get him r/s for 11/4.   Pt was appreciative of the call.

## 2024-04-15 NOTE — Telephone Encounter (Signed)
 Patient again left message stating he has called our office three times and has still not received a return call. Patient stated that he would like to discuss some concerns with a Production designer, theatre/television/film. Patient also stated in message that our customer service is lacking.

## 2024-04-15 NOTE — Telephone Encounter (Signed)
 Patient left message and said he never got a call back yesterday.

## 2024-04-22 ENCOUNTER — Other Ambulatory Visit: Payer: Self-pay

## 2024-04-22 DIAGNOSIS — N200 Calculus of kidney: Secondary | ICD-10-CM

## 2024-04-22 DIAGNOSIS — N528 Other male erectile dysfunction: Secondary | ICD-10-CM

## 2024-04-22 DIAGNOSIS — F5232 Male orgasmic disorder: Secondary | ICD-10-CM

## 2024-04-22 MED ORDER — TADALAFIL 5 MG PO TABS: 5.0000 mg | ORAL_TABLET | Freq: Every day | ORAL | 0 refills | Status: DC

## 2024-04-26 NOTE — Progress Notes (Deleted)
 04/28/2024 4:26 PM   Manuel Stafford. 1945-07-17 [id number]  Referring provider: Bertrum Charlie CROME, MD [address] [place],  [place] [address]  Urological history: 1. Nephrolithiasis - stone composition 65% calcium  oxalate, 30% uric acid and 5% calcium  phosphate - right URS (2017)   2. BPH with LU TS - PSA (2023) 0.3  - cysto (2023) enlarged prostate and mildly elevated bladder neck - tamsulosin  0.4 mg daily   3. ED - tadalafil  5 mg daily  - sildenafil  100 mg, on demand dosing   No chief complaint on file.  HPI: Manuel Stafford. is a 78 y.o. man who presents today for yearly visit.   Previous records reviewed.  I PSS ***  He reports sensation of incomplete bladder emptying,   urinary frequency,   urinary intermittency,   urinary urgency,   a weak urinary stream,   having to strain to void,   nocturia x ***,   leaking before being able to reach the restroom,   leaking with coughing,   leaking without awareness,   and post void dribbling.     He is wearing *** pads//depends  daily.    Patient denies any modifying or aggravating factors.  Patient denies any recent UTI's, gross hematuria, dysuria or suprapubic/flank pain.  Patient denies any fevers, chills, nausea or vomiting.  ***  He has a family history of PCa, colon cancer, ovarian cancer and/or breast cancer with ***.   He does not have a family history of PCa, colon cancer, ovarian cancer, and/or breast cancer .***     UA***  PVR***  PSA  ***  Serum creatinine (02/2024) 1.7, eGFR 41  Hemoglobin A1c (02/2024) 7.8  Diuretics:  ***  BPH meds: ***  Fluid consumption: ***  SHIM ***  He does not have confidence that he could get and keep an erection, his erections are not firm enough for penetrative intercourse, he has difficulty maintaining his erections,  and he is not finding intercourse satisfactory for him.  ***  Patient still having spontaneous erections.  ***   He  denies any pain or curvature with erections.    He is not able to ejaculate, has pain with ejaculation, and has blood in his ejaculate fluid.   ***  Testosterone level ***  Lipids: (02/2024) low HDL  Hemoglobin A1c  (02/2024) 7.8  TSH ***   Tried and failed ***  PMH: Past Medical History:  Diagnosis Date   Anxiety    Aortic atherosclerosis    Basal cell carcinoma 10/14/2023   L med ankle, EDC   Cervical osteophyte    Chronic lower back pain    Coronary artery disease    DDD (degenerative disc disease), cervical    DDD (degenerative disc disease), lumbar    Depression    Diastolic dysfunction 05/31/2021   a.) TTE 05/31/2021: EF >55%, mild BAE, mild RVE, triv TR/PR, mild MR, G1DD.   Erectile dysfunction    a.) on PDE5i (tadalafil )   Fatty liver disease, nonalcoholic    GERD (gastroesophageal reflux disease)    Hemorrhoids 2017   History of kidney stones    Hyperlipidemia    Hypertension    Hypertensive left ventricular hypertrophy    Intermittent complete heart block (HCC)    Left inguinal hernia    Long term (current) use of aspirin     Lumbar spondylolysis    Lung nodules    Medial meniscus tear    Obstructive sleep apnea treated  with BiPAP    OSA treated with BiPAP    Osteoarthritis    Parkinsonian features    RBBB (right bundle branch block)    Recurrent aspiration pneumonia (HCC)    RLS (restless legs syndrome)    a.) on ropinirole    Sigmoid diverticulosis    Stage 3a chronic kidney disease (CKD) (HCC)    T2DM (type 2 diabetes mellitus) (HCC)    Tremor     Surgical History: Past Surgical History:  Procedure Laterality Date   ANTERIOR CERVICAL DECOMP/DISCECTOMY FUSION N/A 04/04/2022   Procedure: C3-6 OSTEOPHYTE REMOVAL;  Surgeon: Clois Fret, MD;  Location: [facility];  Service: Neurosurgery;  Laterality: N/A;   CIRCUMCISION     COLONOSCOPY  2007   COLONOSCOPY WITH PROPOFOL  N/A 08/22/2016   Procedure: COLONOSCOPY WITH PROPOFOL ;  Surgeon:  Louanne KANDICE Muse, MD;  Location: [facility];  Service: Endoscopy;  Laterality: N/A;   CYSTOSCOPY WITH STENT PLACEMENT Right 09/18/2015   Procedure: CYSTOSCOPY WITH STENT PLACEMENT;  Surgeon: Rosina Riis, MD;  Location: [facility];  Service: Urology;  Laterality: Right;   CYSTOSCOPY WITH URETEROSCOPY, STONE BASKETRY AND STENT PLACEMENT Right 10/03/2015   Procedure: CYSTOSCOPY WITH URETEROSCOPY, STONE BASKETRY AND STENT PLACEMENT;  Surgeon: Rosina Riis, MD;  Location: [facility];  Service: Urology;  Laterality: Right;   EXTRACORPOREAL SHOCK WAVE LITHOTRIPSY Right 09/15/2015   Procedure: EXTRACORPOREAL SHOCK WAVE LITHOTRIPSY (ESWL);  Surgeon: Redell Lynwood Napoleon, MD;  Location: [facility];  Service: Urology;  Laterality: Right;   HERNIA REPAIR     KNEE ARTHROSCOPY WITH MEDIAL MENISECTOMY Right 02/26/2023   Procedure: Right knee arthroscopic partial medial meniscectomy;  Surgeon: Tobie Priest, MD;  Location: [facility];  Service: Orthopedics;  Laterality: Right;   ROTATOR CUFF REPAIR Right 06/2012    Home Medications:  Allergies as of 04/28/2024   No Known Allergies      Medication List        Accurate as of April 26, 2024  4:26 PM. If you have any questions, ask your nurse or doctor.          acetaminophen  500 MG tablet Commonly known as: TYLENOL  Take 500 mg by mouth.   aspirin  EC 81 MG tablet Take 81 mg by mouth.   cyanocobalamin 1000 MCG tablet Commonly known as: VITAMIN B12 Take 1,000 mcg by mouth.   Farxiga 10 MG Tabs tablet Generic drug: dapagliflozin propanediol Take 10 mg by mouth daily.   FreeStyle Libre 2 Plus Sensor Misc 1 each by Other route.   hydrochlorothiazide  25 MG tablet Commonly known as: HYDRODIURIL  Take 25 mg by mouth daily.   ketoconazole  2 % cream Commonly known as: NIZORAL  Apply 1 Application topically at bedtime. feet   MULTIPLE VITAMIN PO Take 1 tablet by mouth daily.   Ozempic  (1 MG/DOSE) 2 MG/1.5ML Sopn Generic drug: Semaglutide  (1  MG/DOSE) Inject 1 mg into the skin once a week. Thursdays   pantoprazole  40 MG tablet Commonly known as: PROTONIX  Take 40 mg by mouth daily.   pioglitazone  15 MG tablet Commonly known as: ACTOS  Take 15 mg by mouth.   rosuvastatin  20 MG tablet Commonly known as: CRESTOR  Take 20 mg by mouth daily.   Saw Palmetto  Berry 160 MG Caps Take 2 tablets by mouth daily.   sildenafil  100 MG tablet Commonly known as: VIAGRA  Take 100 mg by mouth daily as needed.   tadalafil  20 MG tablet Commonly known as: CIALIS  Take 1 tablet (20 mg total) by mouth daily as needed for erectile dysfunction.  tadalafil  5 MG tablet Commonly known as: CIALIS  Take 1 tablet (5 mg total) by mouth daily.   tamsulosin  0.4 MG Caps capsule Commonly known as: FLOMAX  TAKE 1 CAPSULE BY MOUTH EVERY DAY   telmisartan 40 MG tablet Commonly known as: MICARDIS TAKE 1 TABLET BY MOUTH EVERY DAY What changed: when to take this   tiZANidine  4 MG tablet Commonly known as: ZANAFLEX  TAKE 1 TABLET BY MOUTH EVERYDAY AT BEDTIME What changed: See the new instructions.   Vitamin D3 50 MCG (2000 UT) capsule Take 2,000 Units by mouth daily.        Allergies: No Known Allergies  Family History: Family History  Problem Relation Age of Onset   Hypertension Mother    Diabetes Father    CAD Father    Diabetes Sister    Heart disease Sister    CAD Sister     Social History:  reports that he quit smoking about 49 years ago. His smoking use included cigarettes. He started smoking about 64 years ago. He has a 30 pack-year smoking history. He has never used smokeless tobacco. He reports current alcohol use. He reports that he does not use drugs.  ROS: Pertinent ROS in HPI  Physical Exam: There were no vitals taken for this visit.  Constitutional:  Well nourished. Alert and oriented, No acute distress. HEENT: [place] AT, moist mucus membranes.  Trachea midline, no masses. Cardiovascular: No clubbing, cyanosis, or  edema. Respiratory: Normal respiratory effort, no increased work of breathing. GI: Abdomen is soft, non tender, non distended, no abdominal masses. Liver and spleen not palpable.  No hernias appreciated.  Stool sample for occult testing is not indicated.   GU: No CVA tenderness.  No bladder fullness or masses.  Patient with circumcised/uncircumcised phallus. ***Foreskin easily retracted***  Urethral meatus is patent.  No penile discharge. No penile lesions or rashes. Scrotum without lesions, cysts, rashes and/or edema.  Testicles are located scrotally bilaterally. No masses are appreciated in the testicles. Left and right epididymis are normal. Rectal: Patient with  normal sphincter tone. Anus and perineum without scarring or rashes. No rectal masses are appreciated. Prostate is approximately *** grams, *** nodules are appreciated. Seminal vesicles are normal. Skin: No rashes, bruises or suspicious lesions. Lymph: No cervical or inguinal adenopathy. Neurologic: Grossly intact, no focal deficits, moving all 4 extremities. Psychiatric: Normal mood and affect.  Laboratory Data: See Epic and HPI   I have reviewed the labs.   Pertinent Imaging: N/A  Assessment & Plan:  ***  1. BPH with LU TS - mild, moderate severe symptoms *** and he is *** - PVR < 300 cc *** - most bothersome symptoms are *** - encouraged avoiding bladder irritants, fluid restriction before bedtime and timed voiding's - Initiate alpha-blocker (***), discussed side effects *** - Initiate 5 alpha reductase inhibitor (***), discussed side effects *** - Continue tamsulosin  0.4 mg daily ***:refills given - educated on red flag symptoms: acute retention, gross hematuria, fever, severe pain - advised to call clinic or go to the ED if these occur - return to clinic in *** symptom re-evaluation ***  2. Erectile dysfunction  I explained that conditions like diabetes, hypertension, coronary artery disease, peripheral vascular  disease, smoking, alcohol consumption, age, sleep apnea and BPH can diminish the ability to have an erection ***  We will obtain a serum testosterone level at this time; if it is abnormal we will need to repeat the study for confirmation ***  Explained that moderate to  vigorous aerobic exercise for 40 minutes 4 times per week can decrease erectile problems caused by physical inactivity, obesity, hypertension, metabolic syndrome and/or cardiovascular diseases ***  We discussed trying a *** different PDE5 inhibitor, intra-urethral suppositories, ICI, vacuum erection devices, Li-SWT, and penile prosthesis implantation  - continue tadalafil  5 mg daily - continue sildenafil  100 mg, on-demand-dosing  3. Nephrolithiasis - no symptoms of renal colic, gross hematuria or passage of fragment since last visit *** - continue to monitor clinically ***  No follow-ups on file.  These notes generated with voice recognition software. I apologize for typographical errors.  CLOTILDA HELON RIGGERS  [facility] [address]  [address] [place], [place] [address] [phone]

## 2024-04-28 ENCOUNTER — Ambulatory Visit: Admitting: Urology

## 2024-04-28 DIAGNOSIS — N200 Calculus of kidney: Secondary | ICD-10-CM

## 2024-04-28 DIAGNOSIS — N529 Male erectile dysfunction, unspecified: Secondary | ICD-10-CM

## 2024-04-28 DIAGNOSIS — N401 Enlarged prostate with lower urinary tract symptoms: Secondary | ICD-10-CM

## 2024-05-03 NOTE — Progress Notes (Unsigned)
 05/05/2024 11:11 AM   Manuel Stafford 1945/10/17 [id number]  Referring provider: Bertrum Charlie CROME, MD [address] [place],  [place] [address]  Urological history: 1. Nephrolithiasis - stone composition 65% calcium  oxalate, 30% uric acid and 5% calcium  phosphate - right URS (2017)   2. BPH with LU TS - PSA (2023) 0.3  - cysto (2023) enlarged prostate and mildly elevated bladder neck - tamsulosin  0.4 mg daily   3. ED - tadalafil  5 mg daily  - sildenafil  100 mg, on demand dosing   Chief Complaint  Patient presents with   BPH with obstruction/lower urinary tract symptoms   HPI: Manuel Stafford. is a 78 y.o. man who presents today for yearly visit.   Previous records reviewed.  I PSS 7/2  He reports sensation of incomplete bladder emptying, urinary frequency, urinary intermittency, urinary urgency, and nocturia x 2.   Patient denies any modifying or aggravating factors.  Patient denies any recent UTI's, gross hematuria, dysuria or suprapubic/flank pain.  Patient denies any fevers, chills, nausea or vomiting.    PVR 0 mL   Serum creatinine (02/2024) 1.7, eGFR 41  Hemoglobin A1c (02/2024) 7.8  BPH meds: Tamsulosin  0.4 mg daily  SHIM 5  He does not have confidence that he could get and keep an erection, his erections are not firm enough for penetrative intercourse, he has difficulty maintaining his erections,  and he is not finding intercourse satisfactory for him.    Patient is not having spontaneous erections.  He denies any pain or curvature with erections.    Lipids: (02/2024) low HDL  Hemoglobin A1c  (02/2024) 7.8  He is taking tadalafil  5 mg daily and then on the nights when he wants to have intercourse he has been taking the 20 mg tablet.  He states he is only achieving a semifirm erection.  He denies any facial flushing, headache, nasal congestion or backache with taking Cialis .  He is not interested in any injections.  He is also not  wanting a referral for IPP.   PMH: Past Medical History:  Diagnosis Date   Anxiety    Aortic atherosclerosis    Basal cell carcinoma 10/14/2023   L med ankle, EDC   Cervical osteophyte    Chronic lower back pain    Coronary artery disease    DDD (degenerative disc disease), cervical    DDD (degenerative disc disease), lumbar    Depression    Diastolic dysfunction 05/31/2021   a.) TTE 05/31/2021: EF >55%, mild BAE, mild RVE, triv TR/PR, mild MR, G1DD.   Erectile dysfunction    a.) on PDE5i (tadalafil )   Fatty liver disease, nonalcoholic    GERD (gastroesophageal reflux disease)    Hemorrhoids 2017   History of kidney stones    Hyperlipidemia    Hypertension    Hypertensive left ventricular hypertrophy    Intermittent complete heart block (HCC)    Left inguinal hernia    Long term (current) use of aspirin     Lumbar spondylolysis    Lung nodules    Medial meniscus tear    Obstructive sleep apnea treated with BiPAP    OSA treated with BiPAP    Osteoarthritis    Parkinsonian features    RBBB (right bundle branch block)    Recurrent aspiration pneumonia (HCC)    RLS (restless legs syndrome)    a.) on ropinirole    Sigmoid diverticulosis    Stage 3a chronic kidney disease (CKD) (HCC)  T2DM (type 2 diabetes mellitus) (HCC)    Tremor     Surgical History: Past Surgical History:  Procedure Laterality Date   ANTERIOR CERVICAL DECOMP/DISCECTOMY FUSION N/A 04/04/2022   Procedure: C3-6 OSTEOPHYTE REMOVAL;  Surgeon: Clois Fret, MD;  Location: [facility];  Service: Neurosurgery;  Laterality: N/A;   CIRCUMCISION     COLONOSCOPY  2007   COLONOSCOPY WITH PROPOFOL  N/A 08/22/2016   Procedure: COLONOSCOPY WITH PROPOFOL ;  Surgeon: Louanne KANDICE Muse, MD;  Location: [facility];  Service: Endoscopy;  Laterality: N/A;   CYSTOSCOPY WITH STENT PLACEMENT Right 09/18/2015   Procedure: CYSTOSCOPY WITH STENT PLACEMENT;  Surgeon: Rosina Riis, MD;  Location: [facility];  Service:  Urology;  Laterality: Right;   CYSTOSCOPY WITH URETEROSCOPY, STONE BASKETRY AND STENT PLACEMENT Right 10/03/2015   Procedure: CYSTOSCOPY WITH URETEROSCOPY, STONE BASKETRY AND STENT PLACEMENT;  Surgeon: Rosina Riis, MD;  Location: [facility];  Service: Urology;  Laterality: Right;   EXTRACORPOREAL SHOCK WAVE LITHOTRIPSY Right 09/15/2015   Procedure: EXTRACORPOREAL SHOCK WAVE LITHOTRIPSY (ESWL);  Surgeon: Redell Lynwood Napoleon, MD;  Location: [facility];  Service: Urology;  Laterality: Right;   HERNIA REPAIR     KNEE ARTHROSCOPY WITH MEDIAL MENISECTOMY Right 02/26/2023   Procedure: Right knee arthroscopic partial medial meniscectomy;  Surgeon: Tobie Priest, MD;  Location: [facility];  Service: Orthopedics;  Laterality: Right;   ROTATOR CUFF REPAIR Right 06/2012    Home Medications:  Allergies as of 05/05/2024   No Known Allergies      Medication List        Accurate as of May 05, 2024 11:11 AM. If you have any questions, ask your nurse or doctor.          STOP taking these medications    sildenafil  100 MG tablet Commonly known as: VIAGRA  Stopped by: Kaylia Winborne       TAKE these medications    acetaminophen  500 MG tablet Commonly known as: TYLENOL  Take 500 mg by mouth.   aspirin  EC 81 MG tablet Take 81 mg by mouth.   cyanocobalamin 1000 MCG tablet Commonly known as: VITAMIN B12 Take 1,000 mcg by mouth.   Farxiga 10 MG Tabs tablet Generic drug: dapagliflozin propanediol Take 10 mg by mouth daily.   FreeStyle Libre 2 Plus Sensor Misc 1 each by Other route.   hydrochlorothiazide  25 MG tablet Commonly known as: HYDRODIURIL  Take 25 mg by mouth daily.   ketoconazole  2 % cream Commonly known as: NIZORAL  Apply 1 Application topically at bedtime. feet   metFORMIN  1000 MG tablet Commonly known as: GLUCOPHAGE  Take 1,000 mg by mouth 2 (two) times daily.   MULTIPLE VITAMIN PO Take 1 tablet by mouth daily.   Ozempic  (1 MG/DOSE) 2 MG/1.5ML Sopn Generic drug:  Semaglutide  (1 MG/DOSE) Inject 1 mg into the skin once a week. Thursdays   pantoprazole  40 MG tablet Commonly known as: PROTONIX  Take 40 mg by mouth daily.   pioglitazone  15 MG tablet Commonly known as: ACTOS  Take 15 mg by mouth.   rosuvastatin  20 MG tablet Commonly known as: CRESTOR  Take 20 mg by mouth daily.   Saw Palmetto  Berry 160 MG Caps Take 2 tablets by mouth daily.   tadalafil  5 MG tablet Commonly known as: CIALIS  Take 1 tablet (5 mg total) by mouth daily. What changed: Another medication with the same name was changed. Make sure you understand how and when to take each. Changed by: CLOTILDA CORNWALL   tadalafil  20 MG tablet Commonly known as: CIALIS  Take up to  2 tablets (40 mg) prior to intercourse What changed:  how much to take how to take this when to take this reasons to take this additional instructions Changed by: Enas Winchel   tamsulosin  0.4 MG Caps capsule Commonly known as: FLOMAX  TAKE 1 CAPSULE BY MOUTH EVERY DAY   telmisartan 40 MG tablet Commonly known as: MICARDIS Take 1 tablet by mouth daily. What changed: Another medication with the same name was removed. Continue taking this medication, and follow the directions you see here. Changed by: CLOTILDA CORNWALL   tiZANidine  4 MG tablet Commonly known as: ZANAFLEX  Take 4 mg by mouth. What changed: Another medication with the same name was removed. Continue taking this medication, and follow the directions you see here. Changed by: CLOTILDA CORNWALL   Vitamin D3 50 MCG (2000 UT) capsule Take 2,000 Units by mouth daily.        Allergies: No Known Allergies  Family History: Family History  Problem Relation Age of Onset   Hypertension Mother    Diabetes Father    CAD Father    Diabetes Sister    Heart disease Sister    CAD Sister     Social History:  reports that he quit smoking about 49 years ago. His smoking use included cigarettes. He started smoking about 64 years ago. He has a  30 pack-year smoking history. He has never used smokeless tobacco. He reports current alcohol use. He reports that he does not use drugs.  ROS: Pertinent ROS in HPI  Physical Exam: BP 123/69   Pulse 87   Ht 6' (1.829 m)   Wt 241 lb 8 oz (109.5 kg)   SpO2 95%   BMI 32.75 kg/m   Constitutional:  Well nourished. Alert and oriented, No acute distress. HEENT: [place] AT, moist mucus membranes.  Trachea midline Cardiovascular: No clubbing, cyanosis, or edema. Respiratory: Normal respiratory effort, no increased work of breathing. Neurologic: Grossly intact, no focal deficits, moving all 4 extremities. Psychiatric: Normal mood and affect.  Laboratory Data: See Epic and HPI   I have reviewed the labs.   Pertinent Imaging: N/A  Assessment & Plan:    1. BPH with LU TS - mild symptoms and he is mostly satisfied - PVR < 300 cc  - encouraged avoiding bladder irritants, fluid restriction before bedtime and timed voiding's - Continue tamsulosin  0.4 mg daily  2. Erectile dysfunction - He is not interested in ICI or IPP at this time, so we discussed taking 40 mg of tadalafil  prior to intercourse since he cannot tolerate the medication and he is not had satisfactory erections at the 20 mg dose and he is agreeable -If he finds that the 40 mg tadalafil  is not effective, we will then try the sildenafil , tadalafil , apomorphine troches [facility] -He will let me know via MyChart  3. Nephrolithiasis - no symptoms of renal colic, gross hematuria or passage of fragment since last visit  - continue to monitor clinically   Return for patient will notify me via MyChart .  These notes generated with voice recognition software. I apologize for typographical errors.  CLOTILDA CORNWALL RIGGERS  [facility] [address]  [address] [place], [place] [address] [phone]

## 2024-05-05 ENCOUNTER — Ambulatory Visit: Admitting: Urology

## 2024-05-05 ENCOUNTER — Encounter: Payer: Self-pay | Admitting: Urology

## 2024-05-05 VITALS — BP 123/69 | HR 87 | Ht 72.0 in | Wt 241.5 lb

## 2024-05-05 DIAGNOSIS — N401 Enlarged prostate with lower urinary tract symptoms: Secondary | ICD-10-CM | POA: Diagnosis not present

## 2024-05-05 DIAGNOSIS — N200 Calculus of kidney: Secondary | ICD-10-CM

## 2024-05-05 DIAGNOSIS — N528 Other male erectile dysfunction: Secondary | ICD-10-CM

## 2024-05-05 DIAGNOSIS — F5232 Male orgasmic disorder: Secondary | ICD-10-CM

## 2024-05-05 DIAGNOSIS — N138 Other obstructive and reflux uropathy: Secondary | ICD-10-CM

## 2024-05-05 LAB — BLADDER SCAN AMB NON-IMAGING

## 2024-05-05 MED ORDER — TADALAFIL 5 MG PO TABS: 5.0000 mg | ORAL_TABLET | Freq: Every day | ORAL | 3 refills | Status: AC

## 2024-05-05 MED ORDER — TADALAFIL 20 MG PO TABS: ORAL_TABLET | ORAL | 3 refills | Status: AC

## 2024-06-08 ENCOUNTER — Emergency Department

## 2024-06-08 ENCOUNTER — Other Ambulatory Visit: Payer: Self-pay

## 2024-06-08 ENCOUNTER — Inpatient Hospital Stay
Admission: EM | Admit: 2024-06-08 | Discharge: 2024-06-09 | DRG: 194 | Disposition: A | Attending: Internal Medicine | Admitting: Internal Medicine

## 2024-06-08 DIAGNOSIS — E1122 Type 2 diabetes mellitus with diabetic chronic kidney disease: Secondary | ICD-10-CM | POA: Diagnosis present

## 2024-06-08 DIAGNOSIS — J189 Pneumonia, unspecified organism: Principal | ICD-10-CM

## 2024-06-08 DIAGNOSIS — Z7982 Long term (current) use of aspirin: Secondary | ICD-10-CM | POA: Diagnosis not present

## 2024-06-08 DIAGNOSIS — Z7984 Long term (current) use of oral hypoglycemic drugs: Secondary | ICD-10-CM | POA: Diagnosis not present

## 2024-06-08 DIAGNOSIS — I152 Hypertension secondary to endocrine disorders: Secondary | ICD-10-CM | POA: Diagnosis present

## 2024-06-08 DIAGNOSIS — G4731 Primary central sleep apnea: Secondary | ICD-10-CM

## 2024-06-08 DIAGNOSIS — Z1152 Encounter for screening for COVID-19: Secondary | ICD-10-CM | POA: Diagnosis not present

## 2024-06-08 DIAGNOSIS — E871 Hypo-osmolality and hyponatremia: Secondary | ICD-10-CM | POA: Diagnosis present

## 2024-06-08 DIAGNOSIS — Z87891 Personal history of nicotine dependence: Secondary | ICD-10-CM | POA: Diagnosis not present

## 2024-06-08 DIAGNOSIS — Z789 Other specified health status: Secondary | ICD-10-CM

## 2024-06-08 DIAGNOSIS — Z833 Family history of diabetes mellitus: Secondary | ICD-10-CM | POA: Diagnosis not present

## 2024-06-08 DIAGNOSIS — E1159 Type 2 diabetes mellitus with other circulatory complications: Secondary | ICD-10-CM | POA: Diagnosis present

## 2024-06-08 DIAGNOSIS — E1165 Type 2 diabetes mellitus with hyperglycemia: Secondary | ICD-10-CM | POA: Diagnosis present

## 2024-06-08 DIAGNOSIS — I129 Hypertensive chronic kidney disease with stage 1 through stage 4 chronic kidney disease, or unspecified chronic kidney disease: Secondary | ICD-10-CM | POA: Diagnosis present

## 2024-06-08 DIAGNOSIS — Z8249 Family history of ischemic heart disease and other diseases of the circulatory system: Secondary | ICD-10-CM | POA: Diagnosis not present

## 2024-06-08 DIAGNOSIS — E119 Type 2 diabetes mellitus without complications: Secondary | ICD-10-CM

## 2024-06-08 DIAGNOSIS — Z79899 Other long term (current) drug therapy: Secondary | ICD-10-CM | POA: Diagnosis not present

## 2024-06-08 DIAGNOSIS — N1831 Chronic kidney disease, stage 3a: Secondary | ICD-10-CM | POA: Diagnosis present

## 2024-06-08 DIAGNOSIS — Z87442 Personal history of urinary calculi: Secondary | ICD-10-CM | POA: Diagnosis not present

## 2024-06-08 DIAGNOSIS — N200 Calculus of kidney: Secondary | ICD-10-CM

## 2024-06-08 DIAGNOSIS — Z85828 Personal history of other malignant neoplasm of skin: Secondary | ICD-10-CM | POA: Diagnosis not present

## 2024-06-08 DIAGNOSIS — J208 Acute bronchitis due to other specified organisms: Secondary | ICD-10-CM | POA: Diagnosis present

## 2024-06-08 LAB — BASIC METABOLIC PANEL WITH GFR
Anion gap: 12 (ref 5–15)
BUN: 22 mg/dL (ref 8–23)
CO2: 24 mmol/L (ref 22–32)
Calcium: 9.8 mg/dL (ref 8.9–10.3)
Chloride: 99 mmol/L (ref 98–111)
Creatinine, Ser: 1.66 mg/dL — ABNORMAL HIGH (ref 0.61–1.24)
GFR, Estimated: 42 mL/min — ABNORMAL LOW (ref >60)
Glucose, Bld: 290 mg/dL — ABNORMAL HIGH (ref 70–99)
Potassium: 4 mmol/L (ref 3.5–5.1)
Sodium: 134 mmol/L — ABNORMAL LOW (ref 135–145)

## 2024-06-08 LAB — CBC
HCT: 47 % (ref 39.0–52.0)
Hemoglobin: 15 g/dL (ref 13.0–17.0)
MCH: 29.6 pg (ref 26.0–34.0)
MCHC: 31.9 g/dL (ref 30.0–36.0)
MCV: 92.9 fL (ref 80.0–100.0)
Platelets: 194 K/uL (ref 150–400)
RBC: 5.06 MIL/uL (ref 4.22–5.81)
RDW: 14.7 % (ref 11.5–15.5)
WBC: 13.1 K/uL — ABNORMAL HIGH (ref 4.0–10.5)
nRBC: 0 % (ref 0.0–0.2)

## 2024-06-08 LAB — MRSA NEXT GEN BY PCR, NASAL: MRSA by PCR Next Gen: NOT DETECTED

## 2024-06-08 LAB — GLUCOSE, CAPILLARY
Glucose-Capillary: 234 mg/dL — ABNORMAL HIGH (ref 70–99)
Glucose-Capillary: 202 mg/dL — ABNORMAL HIGH (ref 70–99)

## 2024-06-08 LAB — EXPECTORATED SPUTUM ASSESSMENT W GRAM STAIN, RFLX TO RESP C

## 2024-06-08 LAB — RESP PANEL BY RT-PCR (RSV, FLU A&B, COVID)  RVPGX2
Influenza A by PCR: NEGATIVE
Influenza B by PCR: NEGATIVE
Resp Syncytial Virus by PCR: NEGATIVE
SARS Coronavirus 2 by RT PCR: NEGATIVE

## 2024-06-08 LAB — HEMOGLOBIN A1C
Hgb A1c MFr Bld: 8.5 % — ABNORMAL HIGH (ref 4.8–5.6)
Mean Plasma Glucose: 197.25 mg/dL

## 2024-06-08 LAB — TROPONIN T, HIGH SENSITIVITY: Troponin T High Sensitivity: 17 ng/L (ref 0–19)

## 2024-06-08 MED ORDER — ACETAMINOPHEN 325 MG PO TABS: 650.0000 mg | ORAL_TABLET | Freq: Four times a day (QID) | ORAL | Status: DC | PRN

## 2024-06-08 MED ORDER — ENOXAPARIN SODIUM 60 MG/0.6ML IJ SOSY
0.5000 mg/kg | PREFILLED_SYRINGE | INTRAMUSCULAR | Status: DC
  Administered 2024-06-08: 21:00:00 55 mg via SUBCUTANEOUS
  Filled 2024-06-08: qty 0.6

## 2024-06-08 MED ORDER — ONDANSETRON HCL 4 MG/2ML IJ SOLN: 4.0000 mg | Freq: Four times a day (QID) | INTRAMUSCULAR | Status: DC | PRN

## 2024-06-08 MED ORDER — PANTOPRAZOLE SODIUM 40 MG PO TBEC
40.0000 mg | DELAYED_RELEASE_TABLET | Freq: Every day | ORAL | Status: DC
  Administered 2024-06-09: 10:00:00 40 mg via ORAL
  Filled 2024-06-08: qty 1

## 2024-06-08 MED ORDER — IPRATROPIUM-ALBUTEROL 0.5-2.5 (3) MG/3ML IN SOLN
3.0000 mL | Freq: Four times a day (QID) | RESPIRATORY_TRACT | Status: DC
  Administered 2024-06-08: 14:00:00 3 mL via RESPIRATORY_TRACT
  Filled 2024-06-08: qty 3

## 2024-06-08 MED ORDER — ONDANSETRON HCL 4 MG PO TABS: 4.0000 mg | ORAL_TABLET | Freq: Four times a day (QID) | ORAL | Status: DC | PRN

## 2024-06-08 MED ORDER — SODIUM CHLORIDE 0.9 % IV SOLN: 2.0000 g | INTRAVENOUS | Status: DC

## 2024-06-08 MED ORDER — LIDOCAINE 5 % EX PTCH
1.0000 | MEDICATED_PATCH | CUTANEOUS | Status: DC
  Administered 2024-06-08: 13:00:00 1 via TRANSDERMAL
  Filled 2024-06-08 (×2): qty 1

## 2024-06-08 MED ORDER — ROSUVASTATIN CALCIUM 20 MG PO TABS
20.0000 mg | ORAL_TABLET | Freq: Every day | ORAL | Status: DC
  Administered 2024-06-08: 21:00:00 20 mg via ORAL
  Filled 2024-06-08: qty 1

## 2024-06-08 MED ORDER — SODIUM CHLORIDE 0.9 % IV SOLN
500.0000 mg | Freq: Once | INTRAVENOUS | Status: AC
  Administered 2024-06-08: 13:00:00 500 mg via INTRAVENOUS
  Filled 2024-06-08 (×2): qty 5

## 2024-06-08 MED ORDER — MORPHINE SULFATE (PF) 4 MG/ML IV SOLN
4.0000 mg | Freq: Once | INTRAVENOUS | Status: AC
  Administered 2024-06-08: 12:00:00 4 mg via INTRAVENOUS
  Filled 2024-06-08: qty 1

## 2024-06-08 MED ORDER — TIZANIDINE HCL 4 MG PO TABS
4.0000 mg | ORAL_TABLET | Freq: Every day | ORAL | Status: DC
  Administered 2024-06-08: 21:00:00 4 mg via ORAL
  Filled 2024-06-08: qty 1

## 2024-06-08 MED ORDER — ACETAMINOPHEN 500 MG PO TABS
1000.0000 mg | ORAL_TABLET | Freq: Once | ORAL | Status: AC
  Administered 2024-06-08: 12:00:00 1000 mg via ORAL
  Filled 2024-06-08: qty 2

## 2024-06-08 MED ORDER — IPRATROPIUM-ALBUTEROL 0.5-2.5 (3) MG/3ML IN SOLN
3.0000 mL | Freq: Three times a day (TID) | RESPIRATORY_TRACT | Status: DC
  Administered 2024-06-08 – 2024-06-09 (×2): 3 mL via RESPIRATORY_TRACT
  Filled 2024-06-08 (×2): qty 3

## 2024-06-08 MED ORDER — INSULIN ASPART 100 UNIT/ML IJ SOLN
0.0000 [IU] | Freq: Every day | INTRAMUSCULAR | Status: DC
  Administered 2024-06-08: 22:00:00 2 [IU] via SUBCUTANEOUS
  Filled 2024-06-08: qty 2

## 2024-06-08 MED ORDER — IOHEXOL 350 MG/ML SOLN
75.0000 mL | Freq: Once | INTRAVENOUS | Status: AC | PRN
  Administered 2024-06-08: 10:00:00 60 mL via INTRAVENOUS

## 2024-06-08 MED ORDER — HYDROMORPHONE HCL 1 MG/ML IJ SOLN
0.5000 mg | INTRAMUSCULAR | Status: DC | PRN
  Administered 2024-06-08: 21:00:00 1 mg via INTRAVENOUS
  Filled 2024-06-08: qty 1

## 2024-06-08 MED ORDER — ACETAMINOPHEN 650 MG RE SUPP: 650.0000 mg | Freq: Four times a day (QID) | RECTAL | Status: DC | PRN

## 2024-06-08 MED ORDER — AZITHROMYCIN 500 MG PO TABS
500.0000 mg | ORAL_TABLET | Freq: Every day | ORAL | Status: DC
  Administered 2024-06-09: 10:00:00 500 mg via ORAL
  Filled 2024-06-08: qty 1

## 2024-06-08 MED ORDER — SODIUM CHLORIDE 0.9 % IV SOLN
2.0000 g | Freq: Two times a day (BID) | INTRAVENOUS | Status: DC
  Administered 2024-06-08 – 2024-06-09 (×2): 2 g via INTRAVENOUS
  Filled 2024-06-08 (×3): qty 12.5

## 2024-06-08 MED ORDER — MORPHINE SULFATE (PF) 2 MG/ML IV SOLN
2.0000 mg | Freq: Once | INTRAVENOUS | Status: AC
  Administered 2024-06-08: 10:00:00 2 mg via INTRAVENOUS
  Filled 2024-06-08: qty 1

## 2024-06-08 MED ORDER — INSULIN GLARGINE 100 UNIT/ML ~~LOC~~ SOLN
10.0000 [IU] | Freq: Every day | SUBCUTANEOUS | Status: DC
  Administered 2024-06-08 – 2024-06-09 (×2): 10 [IU] via SUBCUTANEOUS
  Filled 2024-06-08 (×2): qty 0.1

## 2024-06-08 MED ORDER — INSULIN ASPART 100 UNIT/ML IJ SOLN
0.0000 [IU] | Freq: Three times a day (TID) | INTRAMUSCULAR | Status: DC
  Administered 2024-06-08 – 2024-06-09 (×2): 3 [IU] via SUBCUTANEOUS
  Filled 2024-06-08 (×2): qty 3

## 2024-06-08 MED ORDER — ASPIRIN 81 MG PO TBEC
81.0000 mg | DELAYED_RELEASE_TABLET | Freq: Every day | ORAL | Status: DC
  Administered 2024-06-09: 10:00:00 81 mg via ORAL
  Filled 2024-06-08: qty 1

## 2024-06-08 MED ORDER — SODIUM CHLORIDE 0.9 % IV SOLN
2.0000 g | Freq: Once | INTRAVENOUS | Status: AC
  Administered 2024-06-08: 12:00:00 2 g via INTRAVENOUS
  Filled 2024-06-08: qty 20

## 2024-06-08 MED ORDER — IRBESARTAN 150 MG PO TABS
150.0000 mg | ORAL_TABLET | Freq: Every day | ORAL | Status: DC
  Filled 2024-06-08: qty 1

## 2024-06-08 MED ORDER — VANCOMYCIN HCL IN DEXTROSE 1-5 GM/200ML-% IV SOLN: 1000.0000 mg | Freq: Once | INTRAVENOUS | Status: DC

## 2024-06-08 MED ORDER — GUAIFENESIN ER 600 MG PO TB12
1200.0000 mg | ORAL_TABLET | Freq: Two times a day (BID) | ORAL | Status: DC
  Administered 2024-06-08 – 2024-06-09 (×3): 1200 mg via ORAL
  Filled 2024-06-08 (×3): qty 2

## 2024-06-08 MED ORDER — TAMSULOSIN HCL 0.4 MG PO CAPS
0.4000 mg | ORAL_CAPSULE | Freq: Every day | ORAL | Status: DC
  Administered 2024-06-09: 10:00:00 0.4 mg via ORAL
  Filled 2024-06-08: qty 1

## 2024-06-08 NOTE — ED Triage Notes (Signed)
 Pt comes with sob for 4 days., pt states sharp pain  lungs. Pt has had chills. Pt states sob all the time.

## 2024-06-08 NOTE — ED Notes (Signed)
 Pt returned from CT

## 2024-06-08 NOTE — Plan of Care (Signed)
  Problem: Education: Goal: Ability to describe self-care measures that may prevent or decrease complications (Diabetes Survival Skills Education) will improve Outcome: Progressing Goal: Individualized Educational Video(s) Outcome: Progressing   Problem: Coping: Goal: Ability to adjust to condition or change in health will improve Outcome: Progressing   Problem: Fluid Volume: Goal: Ability to maintain a balanced intake and output will improve Outcome: Progressing   Problem: Health Behavior/Discharge Planning: Goal: Ability to identify and utilize available resources and services will improve Outcome: Progressing Goal: Ability to manage health-related needs will improve Outcome: Progressing   Problem: Metabolic: Goal: Ability to maintain appropriate glucose levels will improve Outcome: Progressing   Problem: Nutritional: Goal: Maintenance of adequate nutrition will improve Outcome: Progressing Goal: Progress toward achieving an optimal weight will improve Outcome: Progressing   Problem: Tissue Perfusion: Goal: Adequacy of tissue perfusion will improve Outcome: Progressing   Problem: Education: Goal: Knowledge of General Education information will improve Description: Including pain rating scale, medication(s)/side effects and non-pharmacologic comfort measures Outcome: Progressing   Problem: Health Behavior/Discharge Planning: Goal: Ability to manage health-related needs will improve Outcome: Progressing   Problem: Clinical Measurements: Goal: Ability to maintain clinical measurements within normal limits will improve Outcome: Progressing Goal: Will remain free from infection Outcome: Progressing Goal: Diagnostic test results will improve Outcome: Progressing Goal: Respiratory complications will improve Outcome: Progressing Goal: Cardiovascular complication will be avoided Outcome: Progressing   Problem: Activity: Goal: Risk for activity intolerance will  decrease Outcome: Progressing   Problem: Nutrition: Goal: Adequate nutrition will be maintained Outcome: Progressing   Problem: Coping: Goal: Level of anxiety will decrease Outcome: Progressing   Problem: Elimination: Goal: Will not experience complications related to bowel motility Outcome: Progressing Goal: Will not experience complications related to urinary retention Outcome: Progressing   Problem: Pain Managment: Goal: General experience of comfort will improve and/or be controlled Outcome: Progressing   Problem: Safety: Goal: Ability to remain free from injury will improve Outcome: Progressing   Problem: Skin Integrity: Goal: Risk for impaired skin integrity will decrease Outcome: Progressing

## 2024-06-08 NOTE — H&P (Signed)
 History and Physical    Manuel Stafford. FMW:[id number] DOB: 1946-02-24 DOA: 06/08/2024  PCP: Bertrum Charlie CROME, MD (Confirm with patient/family/NH records and if not entered, this has to be entered at [facility] point of entry) Patient coming from: Home  I have personally briefly reviewed patient's old medical records in [facility]  Chief Complaint: Cough, SOB  HPI: Manuel Stafford. is a 78 y.o. male with medical history significant of HTN, HLD, IIDM, chest pain BPH, presenting with worsening of cough and shortness of breath.  Symptoms started about 3 weeks ago when patient started develop productive cough with clear phlegm, was diagnosed with  lung infection and patient was treated with 1 course 7 days of doxycycline  and prednisone , which she completed 7 days ago.  However he did not feel much improvement after the treatment.  Last few days he has experienced left-sided pleuritic chest pain sharp like, worsening with cough and he has developed increasing exertional dyspnea.  Has episode of chills but no fever.  Denied any nauseous vomiting, no abdominal pain no diarrhea.  ED Course: Afebrile, blood pressure 140/80 O2 saturation 97% on room air.  CTA negative for PE but left lower lobe pneumonia.  Blood work showed WBC 13.1 hemoglobin 15, blood work showed WBC BUN 22 creatinine 1.6 compared to baseline 1.3-1.6 glucose 290 bicarb 24K 4.0.  Patient was given vancomycin  ceftriaxone  and azithromycin  in the ED.  Review of Systems: As per HPI otherwise 14 point review of systems negative.    Past Medical History:  Diagnosis Date   Anxiety    Aortic atherosclerosis    Basal cell carcinoma 10/14/2023   L med ankle, EDC   Cervical osteophyte    Chronic lower back pain    Coronary artery disease    DDD (degenerative disc disease), cervical    DDD (degenerative disc disease), lumbar    Depression    Diastolic dysfunction 05/31/2021   a.) TTE 05/31/2021: EF >55%, mild BAE, mild  RVE, triv TR/PR, mild MR, G1DD.   Erectile dysfunction    a.) on PDE5i (tadalafil )   Fatty liver disease, nonalcoholic    GERD (gastroesophageal reflux disease)    Hemorrhoids 2017   History of kidney stones    Hyperlipidemia    Hypertension    Hypertensive left ventricular hypertrophy    Intermittent complete heart block (HCC)    Left inguinal hernia    Long term (current) use of aspirin     Lumbar spondylolysis    Lung nodules    Medial meniscus tear    Obstructive sleep apnea treated with BiPAP    OSA treated with BiPAP    Osteoarthritis    Parkinsonian features    RBBB (right bundle branch block)    Recurrent aspiration pneumonia (HCC)    RLS (restless legs syndrome)    a.) on ropinirole    Sigmoid diverticulosis    Stage 3a chronic kidney disease (CKD) (HCC)    T2DM (type 2 diabetes mellitus) (HCC)    Tremor     Past Surgical History:  Procedure Laterality Date   ANTERIOR CERVICAL DECOMP/DISCECTOMY FUSION N/A 04/04/2022   Procedure: C3-6 OSTEOPHYTE REMOVAL;  Surgeon: Clois Fret, MD;  Location: [facility] ORS;  Service: Neurosurgery;  Laterality: N/A;   CIRCUMCISION     COLONOSCOPY  2007   COLONOSCOPY WITH PROPOFOL  N/A 08/22/2016   Procedure: COLONOSCOPY WITH PROPOFOL ;  Surgeon: Louanne KANDICE Muse, MD;  Location: [facility] ENDOSCOPY;  Service: Endoscopy;  Laterality: N/A;   CYSTOSCOPY  WITH STENT PLACEMENT Right 09/18/2015   Procedure: CYSTOSCOPY WITH STENT PLACEMENT;  Surgeon: Rosina Riis, MD;  Location: [facility] ORS;  Service: Urology;  Laterality: Right;   CYSTOSCOPY WITH URETEROSCOPY, STONE BASKETRY AND STENT PLACEMENT Right 10/03/2015   Procedure: CYSTOSCOPY WITH URETEROSCOPY, STONE BASKETRY AND STENT PLACEMENT;  Surgeon: Rosina Riis, MD;  Location: [facility] ORS;  Service: Urology;  Laterality: Right;   EXTRACORPOREAL SHOCK WAVE LITHOTRIPSY Right 09/15/2015   Procedure: EXTRACORPOREAL SHOCK WAVE LITHOTRIPSY (ESWL);  Surgeon: Redell Lynwood Napoleon, MD;  Location: [facility] ORS;   Service: Urology;  Laterality: Right;   HERNIA REPAIR     KNEE ARTHROSCOPY WITH MEDIAL MENISECTOMY Right 02/26/2023   Procedure: Right knee arthroscopic partial medial meniscectomy;  Surgeon: Tobie Priest, MD;  Location: [facility] ORS;  Service: Orthopedics;  Laterality: Right;   ROTATOR CUFF REPAIR Right 06/2012     reports that he quit smoking about 49 years ago. His smoking use included cigarettes. He started smoking about 64 years ago. He has a 30 pack-year smoking history. He has never used smokeless tobacco. He reports current alcohol use. He reports that he does not use drugs.  Allergies[1]  Family History  Problem Relation Age of Onset   Hypertension Mother    Diabetes Father    CAD Father    Diabetes Sister    Heart disease Sister    CAD Sister      Prior to Admission medications  Medication Sig Start Date End Date Taking? Authorizing Provider  acetaminophen  (TYLENOL ) 500 MG tablet Take 500 mg by mouth.   Yes [provider]  aspirin  EC 81 MG tablet Take 81 mg by mouth.   Yes [provider]  Cholecalciferol  (VITAMIN D3) 2000 UNITS capsule Take 2,000 Units by mouth daily.    Yes [provider]  cyanocobalamin (VITAMIN B12) 1000 MCG tablet Take 1,000 mcg by mouth.   Yes [provider]  FARXIGA 10 MG TABS tablet Take 10 mg by mouth daily.   Yes [provider]  hydrochlorothiazide  (HYDRODIURIL ) 25 MG tablet Take 25 mg by mouth daily. 11/11/23  Yes [provider]  MULTIPLE VITAMIN PO Take 1 tablet by mouth daily. 04/20/10  Yes [provider]  OZEMPIC , 2 MG/DOSE, 8 MG/3ML SOPN 0.75 Milliliter(s) SUB-Q Once a Week 05/29/24  Yes [provider]  pantoprazole  (PROTONIX ) 40 MG tablet Take 40 mg by mouth daily. 03/26/24  Yes [provider]  pioglitazone  (ACTOS ) 15 MG tablet Take 15 mg by mouth. 03/04/24  Yes [provider]  rosuvastatin  (CRESTOR ) 20 MG tablet Take 20 mg by mouth daily. 12/10/23  Yes  [provider]  Saw Palmetto , Serenoa repens, (SAW PALMETTO  BERRY) 160 MG CAPS Take 2 tablets by mouth daily.  04/20/10  Yes [provider]  tadalafil  (CIALIS ) 20 MG tablet Take up to 2 tablets (40 mg) prior to intercourse 05/05/24  Yes McGowan, Clotilda A, PA-C  tadalafil  (CIALIS ) 5 MG tablet Take 1 tablet (5 mg total) by mouth daily. 05/05/24  Yes McGowan, Clotilda A, PA-C  tamsulosin  (FLOMAX ) 0.4 MG CAPS capsule TAKE 1 CAPSULE BY MOUTH EVERY DAY 10/14/23  Yes McGowan, Clotilda A, PA-C  telmisartan (MICARDIS) 40 MG tablet Take 1 tablet by mouth daily. 05/08/23  Yes [provider]  tiZANidine  (ZANAFLEX ) 4 MG tablet Take 4 mg by mouth. 11/05/23  Yes [provider]  Continuous Glucose Sensor (FREESTYLE LIBRE 2 PLUS SENSOR) MISC 1 each by Other route. 11/15/23   [provider]  ketoconazole  (  NIZORAL ) 2 % cream Apply 1 Application topically at bedtime. feet Patient not taking: Reported on 06/08/2024 10/14/23   Hester Alm BROCKS, MD  metFORMIN  (GLUCOPHAGE ) 1000 MG tablet Take 1,000 mg by mouth 2 (two) times daily. Patient not taking: Reported on 06/08/2024 05/02/24   [provider]  predniSONE  (DELTASONE ) 20 MG tablet Take 20 mg by mouth daily. Patient not taking: Reported on 06/08/2024 05/26/24   [provider]    Physical Exam: Vitals:   06/08/24 1030 06/08/24 1100 06/08/24 1200 06/08/24 1323  BP: 130/67 120/61 123/61 (!) 143/78  Pulse: 84 87 89 93  Resp: 16 15 20 17   Temp:    98.1 F (36.7 C)  TempSrc:    Oral  SpO2: 98% 95% 94% 98%  Weight:      Height:        Constitutional: NAD, calm, comfortable Vitals:   06/08/24 1030 06/08/24 1100 06/08/24 1200 06/08/24 1323  BP: 130/67 120/61 123/61 (!) 143/78  Pulse: 84 87 89 93  Resp: 16 15 20 17   Temp:    98.1 F (36.7 C)  TempSrc:    Oral  SpO2: 98% 95% 94% 98%  Weight:      Height:       Eyes: PERRL, lids and conjunctivae normal ENMT: Mucous membranes are moist.  Posterior pharynx clear of any exudate or lesions.Normal dentition.  Neck: normal, supple, no masses, no thyromegaly Respiratory: clear to auscultation bilaterally, no wheezing, crackles on left lower fields, increasing respiratory effort. No accessory muscle use.  Cardiovascular: Regular rate and rhythm, no murmurs / rubs / gallops. No extremity edema. 2+ pedal pulses. No carotid bruits.  Abdomen: no tenderness, no masses palpated. No hepatosplenomegaly. Bowel sounds positive.  Musculoskeletal: no clubbing / cyanosis. No joint deformity upper and lower extremities. Good ROM, no contractures. Normal muscle tone.  Skin: no rashes, lesions, ulcers. No induration Neurologic: CN 2-12 grossly intact. Sensation intact, DTR normal. Strength 5/5 in all 4.  Psychiatric: Normal judgment and insight. Alert and oriented x 3. Normal mood.    Labs on Admission: I have personally reviewed following labs and imaging studies  CBC: Recent Labs  Lab 06/08/24 0820  WBC 13.1*  HGB 15.0  HCT 47.0  MCV 92.9  PLT 194   Basic Metabolic Panel: Recent Labs  Lab 06/08/24 0820  NA 134*  K 4.0  CL 99  CO2 24  GLUCOSE 290*  BUN 22  CREATININE 1.66*  CALCIUM  9.8   GFR: Estimated Creatinine Clearance: 45.8 mL/min (A) (by C-G formula based on SCr of 1.66 mg/dL (H)). Liver Function Tests: No results for input(s): AST, ALT, ALKPHOS, BILITOT, PROT, ALBUMIN in the last 168 hours. No results for input(s): LIPASE, AMYLASE in the last 168 hours. No results for input(s): AMMONIA in the last 168 hours. Coagulation Profile: No results for input(s): INR, PROTIME in the last 168 hours. Cardiac Enzymes: No results for input(s): CKTOTAL, CKMB, CKMBINDEX, TROPONINI in the last 168 hours. BNP (last 3 results) No results for input(s): PROBNP in the last 8760 hours. HbA1C: No results for input(s): HGBA1C in the last 72 hours. CBG: No results for input(s): GLUCAP in the last 168  hours. Lipid Profile: No results for input(s): CHOL, HDL, LDLCALC, TRIG, CHOLHDL, LDLDIRECT in the last 72 hours. Thyroid  Function Tests: No results for input(s): TSH, T4TOTAL, FREET4, T3FREE, THYROIDAB in the last 72 hours. Anemia Panel: No results for input(s): VITAMINB12, FOLATE, FERRITIN, TIBC, IRON, RETICCTPCT in the last 72 hours. Urine analysis:  Component Value Date/Time   COLORURINE YELLOW 01/15/2022 1012   APPEARANCEUR Clear 01/30/2022 1342   LABSPEC 1.020 01/15/2022 1012   PHURINE 5.0 01/15/2022 1012   GLUCOSEU Negative 01/30/2022 1342   HGBUR NEGATIVE 01/15/2022 1012   BILIRUBINUR Negative 01/30/2022 1342   KETONESUR NEGATIVE 01/15/2022 1012   PROTEINUR Negative 01/30/2022 1342   PROTEINUR NEGATIVE 01/15/2022 1012   UROBILINOGEN 0.2 11/14/2016 1022   NITRITE Negative 01/30/2022 1342   NITRITE NEGATIVE 01/15/2022 1012   LEUKOCYTESUR Negative 01/30/2022 1342   LEUKOCYTESUR NEGATIVE 01/15/2022 1012    Radiological Exams on Admission: CT Angio Chest PE W and/or Wo Contrast Result Date: 06/08/2024 CLINICAL DATA:  Concern for pulmonary embolism. EXAM: CT ANGIOGRAPHY CHEST WITH CONTRAST TECHNIQUE: Multidetector CT imaging of the chest was performed using the standard protocol during bolus administration of intravenous contrast. Multiplanar CT image reconstructions and MIPs were obtained to evaluate the vascular anatomy. RADIATION DOSE REDUCTION: This exam was performed according to the departmental dose-optimization program which includes automated exposure control, adjustment of the mA and/or kV according to patient size and/or use of iterative reconstruction technique. CONTRAST:  60mL OMNIPAQUE  IOHEXOL  350 MG/ML SOLN COMPARISON:  Chest CT dated 07/09/2017. FINDINGS: Evaluation of this exam is limited due to respiratory motion. Cardiovascular: There is no cardiomegaly or pericardial effusion. There is coronary vascular calcification. Mild  atherosclerotic calcification of the thoracic aorta. Maximal dilatation. No pulmonary artery embolus identified. Mediastinum/Nodes: No hilar or mediastinal adenopathy. The esophagus is grossly unremarkable no mediastinal fluid collection. Lungs/Pleura: There are bibasilar atelectasis. An area of nodular consolidation in the medial left lung base may represent pneumonia. Underlying mass is not excluded. Clinical correlation and follow-up to resolution recommended. Small left pleural effusion. No pneumothorax. The central airways are patent. Upper Abdomen: No acute abnormality. Musculoskeletal: Osteopenia with degenerative changes of spine. No acute osseous pathology. Review of the MIP images confirms the above findings. IMPRESSION: 1. No CT evidence of pulmonary artery embolus. 2. Left lung base pneumonia. Clinical correlation and follow-up to resolution recommended. 3. Small left pleural effusion. 4.  Aortic Atherosclerosis (ICD10-I70.0). Electronically Signed   By: Vanetta Chou M.D.   On: 06/08/2024 10:48   DG Chest 2 View Result Date: 06/08/2024 CLINICAL DATA:  Shortness of breath. EXAM: CHEST - 2 VIEW COMPARISON:  Chest radiograph dated 04/11/2022 FINDINGS: Bibasilar atelectasis. Pneumonia is not excluded. No pleural effusion pneumothorax. The cardiac silhouette is within normal limits. No acute osseous pathology. IMPRESSION: Bibasilar atelectasis. Pneumonia is not excluded. Electronically Signed   By: Vanetta Chou M.D.   On: 06/08/2024 08:57    EKG: Independently reviewed.  Sinus rhythm, prolonged PR interval, prolonged QTc.  Assessment/Plan Principal Problem:   Pneumonia Active Problems:   CAP (community acquired pneumonia)  (please populate well all problems here in Problem List. (For example, if patient is on BP meds at home and you resume or decide to hold them, it is a problem that needs to be her. Same for CAD, COPD, HLD and so on)  LLL CAP, bacterial Pleural chest pain - Failed  outpatient management - Will cover Pseudomonas, start cefepime , add azithromycin  to cover atypical infection.  Check sputum culture, atypical pneumonia study Legionella and mycoplasma. - Incentive spirometry and flutter valve - DuoNebs -CTA negative for PE, negative for pleural effusion.  Will try lidocaine  patch. -Repeat chest CT in 6 weeks  IIDM with hyperglycemia -Hold off metformin  as patient received IV contrast today -SSI for now  HTN - Stable, resume home BP meds including hydrochlorothiazide   ARB  Deconditioning -PT evaluation DVT prophylaxis: Lovenox  Code Status: Full code Family Communication: Close friend at bedside Disposition Plan: Patient is sick with pneumonia failed outpatient management requiring IV antibiotics, expect more than 2 midnight hospital stay. Consults called: None Admission status: Telemetry admission   Cort ONEIDA Mana MD [facility] Pager [phone]  06/08/2024, 1:55 PM       [1] No Known Allergies

## 2024-06-08 NOTE — ED Provider Notes (Addendum)
 [facility] Provider Note    Event Date/Time   First MD Initiated Contact with Patient 06/08/24 0813     (approximate)   History   Chief Complaint: Shortness of Breath   HPI  Manuel Stafford. is a 78 y.o. male with a history of hypertension, diabetes, CKD 3, aspiration pneumonia who comes ED complaining of left lower chest pain that is sharp, worse with deep breathing and coughing, constant for the past 4 days.  Waxing and waning without specific alleviating factors.  No exertional symptoms.  No fever or neck pain, nonradiating.  No vomiting or diarrhea.  Normal oral intake.  No recent travel trauma hospitalization or surgery, no history of DVT or PE  Patient notes he was recently treated by PCP for pneumonia with 2 courses of prednisone  and 1 course of doxycycline .  Symptoms have continued worsening.      Past Medical History:  Diagnosis Date   Anxiety    Aortic atherosclerosis    Basal cell carcinoma 10/14/2023   L med ankle, EDC   Cervical osteophyte    Chronic lower back pain    Coronary artery disease    DDD (degenerative disc disease), cervical    DDD (degenerative disc disease), lumbar    Depression    Diastolic dysfunction 05/31/2021   a.) TTE 05/31/2021: EF >55%, mild BAE, mild RVE, triv TR/PR, mild MR, G1DD.   Erectile dysfunction    a.) on PDE5i (tadalafil )   Fatty liver disease, nonalcoholic    GERD (gastroesophageal reflux disease)    Hemorrhoids 2017   History of kidney stones    Hyperlipidemia    Hypertension    Hypertensive left ventricular hypertrophy    Intermittent complete heart block (HCC)    Left inguinal hernia    Long term (current) use of aspirin     Lumbar spondylolysis    Lung nodules    Medial meniscus tear    Obstructive sleep apnea treated with BiPAP    OSA treated with BiPAP    Osteoarthritis    Parkinsonian features    RBBB (right bundle branch block)    Recurrent aspiration pneumonia (HCC)    RLS  (restless legs syndrome)    a.) on ropinirole    Sigmoid diverticulosis    Stage 3a chronic kidney disease (CKD) (HCC)    T2DM (type 2 diabetes mellitus) [facility])    Tremor     Current Outpatient Rx   Order #: [id number] Class: Historical Med   Order #: [id number] Class: Historical Med   Order #: [id number] Class: Historical Med   Order #: [id number] Class: Historical Med   Order #: [id number] Class: Historical Med   Order #: [id number] Class: Historical Med   Order #: [id number] Class: Historical Med   Order #: [id number] Class: Normal   Order #: [id number] Class: Historical Med   Order #: [id number] Class: Historical Med   Order #: [id number] Class: Historical Med   Order #: [id number] Class: Historical Med   Order #: [id number] Class: Historical Med   Order #: [id number] Class: Historical Med   Order #: [id number] Class: Historical Med   Order #: [id number] Class: Normal   Order #: [id number] Class: Normal   Order #: [id number] Class: Normal   Order #: [id number] Class: Historical Med   Order #: [id number] Class: Historical Med    Past Surgical History:  Procedure Laterality Date   ANTERIOR CERVICAL DECOMP/DISCECTOMY FUSION N/A 04/04/2022   Procedure: C3-6 OSTEOPHYTE REMOVAL;  Surgeon: Clois Fret, MD;  Location: [facility] ORS;  Service: Neurosurgery;  Laterality: N/A;   CIRCUMCISION  COLONOSCOPY  2007   COLONOSCOPY WITH PROPOFOL  N/A 08/22/2016   Procedure: COLONOSCOPY WITH PROPOFOL ;  Surgeon: Louanne KANDICE Muse, MD;  Location: [facility];  Service: Endoscopy;  Laterality: N/A;   CYSTOSCOPY WITH STENT PLACEMENT Right 09/18/2015   Procedure: CYSTOSCOPY WITH STENT PLACEMENT;  Surgeon: Rosina Riis, MD;  Location: [facility];  Service: Urology;  Laterality: Right;   CYSTOSCOPY WITH URETEROSCOPY, STONE BASKETRY AND STENT PLACEMENT Right 10/03/2015   Procedure: CYSTOSCOPY WITH URETEROSCOPY, STONE BASKETRY AND STENT PLACEMENT;  Surgeon: Rosina Riis, MD;  Location: [facility];  Service: Urology;  Laterality:  Right;   EXTRACORPOREAL SHOCK WAVE LITHOTRIPSY Right 09/15/2015   Procedure: EXTRACORPOREAL SHOCK WAVE LITHOTRIPSY (ESWL);  Surgeon: Redell Lynwood Napoleon, MD;  Location: [facility] ORS;  Service: Urology;  Laterality: Right;   HERNIA REPAIR     KNEE ARTHROSCOPY WITH MEDIAL MENISECTOMY Right 02/26/2023   Procedure: Right knee arthroscopic partial medial meniscectomy;  Surgeon: Tobie Priest, MD;  Location: [facility] ORS;  Service: Orthopedics;  Laterality: Right;   ROTATOR CUFF REPAIR Right 06/2012    Physical Exam   Triage Vital Signs: ED Triage Vitals  Encounter Vitals Group     BP 06/08/24 0809 (!) 143/83     Girls Systolic BP Percentile --      Girls Diastolic BP Percentile --      Boys Systolic BP Percentile --      Boys Diastolic BP Percentile --      Pulse Rate 06/08/24 0809 92     Resp 06/08/24 0809 20     Temp 06/08/24 0809 97.9 F (36.6 C)     Temp src --      SpO2 06/08/24 0809 97 %     Weight 06/08/24 0810 238 lb (108 kg)     Height 06/08/24 0810 6' (1.829 m)     Head Circumference --      Peak Flow --      Pain Score 06/08/24 0808 10     Pain Loc --      Pain Education --      Exclude from Growth Chart --     Most recent vital signs: Vitals:   06/08/24 1005 06/08/24 1030  BP: 130/62 130/67  Pulse: 81 84  Resp: 17 16  Temp:    SpO2: 98% 98%    General: Awake, no distress.  CV:  Good peripheral perfusion.  Regular rate rhythm, normal distal pulses Resp:  Normal effort.  Clear lungs Abd:  No distention.  Soft nontender Other:  Chest wall nontender.  No lower extremity edema.   ED Results / Procedures / Treatments   Labs (all labs ordered are listed, but only abnormal results are displayed) Labs Reviewed  BASIC METABOLIC PANEL WITH GFR - Abnormal; Notable for the following components:      Result Value   Sodium 134 (*)    Glucose, Bld 290 (*)    Creatinine, Ser 1.66 (*)    GFR, Estimated 42 (*)    All other components within normal limits  CBC - Abnormal; Notable  for the following components:   WBC 13.1 (*)    All other components within normal limits  RESP PANEL BY RT-PCR (RSV, FLU A&B, COVID)  [id number]  TROPONIN T, HIGH SENSITIVITY     EKG Interpreted by me Sinus rhythm rate of 84.  Right axis, right bundle branch block, no acute ischemic changes.   RADIOLOGY Chest x-ray interpreted by me, unremarkable.  Radiology report reviewed   PROCEDURES:  Procedures   MEDICATIONS ORDERED IN ED: Medications  acetaminophen  (TYLENOL ) tablet 1,000 mg (has no administration in time range)  morphine  (PF) 4 MG/ML injection 4 mg (has no administration in time range)  cefTRIAXone  (ROCEPHIN ) 2 g in sodium chloride  0.9 % 100 mL IVPB (has no administration in time range)  azithromycin  (ZITHROMAX ) 500 mg in sodium chloride  0.9 % 250 mL IVPB (has no administration in time range)  vancomycin  (VANCOCIN ) IVPB 1000 mg/200 mL premix (has no administration in time range)  morphine  (PF) 2 MG/ML injection 2 mg (2 mg Intravenous Given 06/08/24 1005)  iohexol  (OMNIPAQUE ) 350 MG/ML injection 75 mL (60 mLs Intravenous Contrast Given 06/08/24 1012)     IMPRESSION / MDM / ASSESSMENT AND PLAN / ED COURSE  I reviewed the triage vital signs and the nursing notes.  DDx: Pneumonia, pneumothorax, pleural effusion, pulmonary edema, NSTEMI, pulmonary embolism, chest wall strain  Patient's presentation is most consistent with acute presentation with potential threat to life or bodily function.  Patient presents with pleuritic chest pain for the past 4 days.  Vital signs unremarkable, initial lab workup and chest x-ray unrevealing.  Will obtain CT chest to evaluate for PE   ----------------------------------------- 11:29 AM on 06/08/2024 ----------------------------------------- CT negative for PE but does show a left lower lobe pneumonia.  Patient has had poor function at home in the setting of this acute illness with failure of outpatient treatment.  Will need to hospitalize  for IV antibiotics and further observation.  Oxygen saturation is 93% on room air.  Not septic.  Clinical Course as of 06/08/24 1147  Mon Jun 08, 2024  1147 Case discussed with hospitalist [PS]    Clinical Course User Index [PS] Viviann Pastor, MD     FINAL CLINICAL IMPRESSION(S) / ED DIAGNOSES   Final diagnoses:  Community acquired pneumonia of left lower lobe of lung  Failure of outpatient treatment  Type 2 diabetes mellitus without complication, without long-term current use of insulin  (HCC)     Rx / DC Orders   ED Discharge Orders     None        Note:  This document was prepared using Dragon voice recognition software and may include unintentional dictation errors.   Viviann Pastor, MD 06/08/24 1130    Viviann Pastor, MD 06/08/24 [phone]

## 2024-06-08 NOTE — Progress Notes (Signed)
 PT Cancellation Note  Patient Details Name: Manuel Stafford. MRN: [id number] DOB: Nov 14, 1945   Cancelled Treatment:    Reason Eval/Treat Not Completed: Patient at procedure or test/unavailable Attempted to see patient 2x this pm, RN/RT in room, as patient had just come to floor from ED. Will re-attempt at later time.  Ioane Bhola 06/08/2024, 3:22 PM

## 2024-06-08 NOTE — Progress Notes (Signed)
 Pharmacy Antibiotic Note  Manuel Stafford. is a 78 y.o. male admitted on 06/08/2024 with pneumonia.  Pharmacy has been consulted for cefepime  dosing.  -patient was treated with 1 course 7 days of doxycycline  and prednisone , which she completed 7 days ago   Plan: Will order cefepime  2 gm IV q12h  for Crcl 45.8 ml/min  Will continue to assess renal fxn, cultures, length of therapy, etc    Height: 6' (182.9 cm) Weight: 108 kg (238 lb) IBW/kg (Calculated) : 77.6  Temp (24hrs), Avg:98 F (36.7 C), Min:97.9 F (36.6 C), Max:98.1 F (36.7 C)  Recent Labs  Lab 06/08/24 0820  WBC 13.1*  CREATININE 1.66*    Estimated Creatinine Clearance: 45.8 mL/min (A) (by C-G formula based on SCr of 1.66 mg/dL (H)).    Allergies[1]  Antimicrobials this admission: Azithromycin /ceftriaxone /12/15 x 1 each Vancomycin  x 1 ordered 12/15 cefepime  12/15 >>    Dose adjustments this admission:    Microbiology results:   BCx:     UCx:      Sputum:      MRSA PCR:  pending  Thank you for allowing pharmacy to be a part of this patients care.  Allean Haas PharmD Clinical Pharmacist 06/08/2024      [1] No Known Allergies

## 2024-06-08 NOTE — ED Notes (Signed)
 Called CCMD for central monitoring at this time

## 2024-06-09 ENCOUNTER — Other Ambulatory Visit: Payer: Self-pay

## 2024-06-09 DIAGNOSIS — J189 Pneumonia, unspecified organism: Secondary | ICD-10-CM | POA: Diagnosis not present

## 2024-06-09 DIAGNOSIS — N1831 Chronic kidney disease, stage 3a: Secondary | ICD-10-CM | POA: Insufficient documentation

## 2024-06-09 DIAGNOSIS — J208 Acute bronchitis due to other specified organisms: Secondary | ICD-10-CM

## 2024-06-09 DIAGNOSIS — E871 Hypo-osmolality and hyponatremia: Secondary | ICD-10-CM | POA: Insufficient documentation

## 2024-06-09 DIAGNOSIS — E1122 Type 2 diabetes mellitus with diabetic chronic kidney disease: Secondary | ICD-10-CM

## 2024-06-09 DIAGNOSIS — J209 Acute bronchitis, unspecified: Secondary | ICD-10-CM | POA: Insufficient documentation

## 2024-06-09 LAB — CBC
HCT: 39.4 % (ref 39.0–52.0)
Hemoglobin: 13 g/dL (ref 13.0–17.0)
MCH: 29.9 pg (ref 26.0–34.0)
MCHC: 33 g/dL (ref 30.0–36.0)
MCV: 90.6 fL (ref 80.0–100.0)
Platelets: 185 K/uL (ref 150–400)
RBC: 4.35 MIL/uL (ref 4.22–5.81)
RDW: 14.7 % (ref 11.5–15.5)
WBC: 11.9 K/uL — ABNORMAL HIGH (ref 4.0–10.5)
nRBC: 0 % (ref 0.0–0.2)

## 2024-06-09 LAB — BASIC METABOLIC PANEL WITH GFR
Anion gap: 11 (ref 5–15)
BUN: 22 mg/dL (ref 8–23)
CO2: 23 mmol/L (ref 22–32)
Calcium: 9.1 mg/dL (ref 8.9–10.3)
Chloride: 100 mmol/L (ref 98–111)
Creatinine, Ser: 1.67 mg/dL — ABNORMAL HIGH (ref 0.61–1.24)
GFR, Estimated: 41 mL/min — ABNORMAL LOW (ref >60)
Glucose, Bld: 189 mg/dL — ABNORMAL HIGH (ref 70–99)
Potassium: 3.6 mmol/L (ref 3.5–5.1)
Sodium: 133 mmol/L — ABNORMAL LOW (ref 135–145)

## 2024-06-09 LAB — GLUCOSE, CAPILLARY: Glucose-Capillary: 219 mg/dL — ABNORMAL HIGH (ref 70–99)

## 2024-06-09 LAB — PRO BRAIN NATRIURETIC PEPTIDE: Pro Brain Natriuretic Peptide: 87.9 pg/mL (ref <300.0)

## 2024-06-09 LAB — PROCALCITONIN: Procalcitonin: 0.18 ng/mL

## 2024-06-09 MED ORDER — ALBUTEROL SULFATE HFA 108 (90 BASE) MCG/ACT IN AERS
2.0000 | INHALATION_SPRAY | Freq: Four times a day (QID) | RESPIRATORY_TRACT | 0 refills | Status: AC | PRN
  Filled 2024-06-09: qty 6.7, 25d supply, fill #0

## 2024-06-09 MED ORDER — IPRATROPIUM-ALBUTEROL 0.5-2.5 (3) MG/3ML IN SOLN: 3.0000 mL | Freq: Two times a day (BID) | RESPIRATORY_TRACT | Status: DC

## 2024-06-09 MED ORDER — FLUTICASONE-SALMETEROL 250-50 MCG/ACT IN AEPB
1.0000 | INHALATION_SPRAY | Freq: Two times a day (BID) | RESPIRATORY_TRACT | 0 refills | Status: AC
  Filled 2024-06-09: qty 60, 30d supply, fill #0

## 2024-06-09 MED ORDER — GUAIFENESIN ER 600 MG PO TB12
1200.0000 mg | ORAL_TABLET | Freq: Two times a day (BID) | ORAL | 0 refills | Status: AC
  Filled 2024-06-09: qty 20, 5d supply, fill #0

## 2024-06-09 MED ORDER — LIDOCAINE 5 % EX OINT
1.0000 | TOPICAL_OINTMENT | CUTANEOUS | 0 refills | Status: AC | PRN
  Filled 2024-06-09: qty 50, 50d supply, fill #0

## 2024-06-09 MED ORDER — OXYCODONE-ACETAMINOPHEN 5-325 MG PO TABS: 1.0000 | ORAL_TABLET | Freq: Three times a day (TID) | ORAL | 0 refills | Status: AC | PRN

## 2024-06-09 NOTE — Progress Notes (Signed)
 TOC Brief Assessment Note  Patient is from home. Admitted with PNA, on IV abx and duo nebs. No TOC needs at this time.

## 2024-06-09 NOTE — Inpatient Diabetes Management (Signed)
 Inpatient Diabetes Program Recommendations  AACE/ADA: New Consensus Statement on Inpatient Glycemic Control (2015)  Target Ranges:  Prepandial:   less than 140 mg/dL      Peak postprandial:   less than 180 mg/dL (1-2 hours)      Critically ill patients:  140 - 180 mg/dL   Lab Results  Component Value Date   GLUCAP 219 (H) 06/09/2024   HGBA1C 8.5 (H) 06/08/2024    Review of Glycemic Control  Diabetes history: DM 2 Outpatient Diabetes medications: Farxiga 10 mg Daily, Ozempic  0.75 mg weekly, Actos  15 mg Daily Current orders for Inpatient glycemic control:  Lantus  10 units Daily Novolog  0-9 units tid + hs  A1c 8.5% on 12/15  Inpatient Diabetes Program Recommendations:    Note: glucose trends in the 200 range.  -   Increase Lantus  to 15 units  Thanks,  Clotilda Bull RN, MSN, BC-ADM Inpatient Diabetes Coordinator Team Pager [phone] (8a-5p)

## 2024-06-09 NOTE — Plan of Care (Signed)

## 2024-06-09 NOTE — Progress Notes (Signed)
 PT Cancellation Note  Patient Details Name: Adelfo Diebel. MRN: [id number] DOB: 1945-09-28   Cancelled Treatment:    Reason Eval/Treat Not Completed: PT screened, no needs identified, will sign off Orders received, chart reviewed. Patient independently walking in hallway with no AD. Able to complete higher level balance activities with no difficulty. Patient reports being at baseline. No skilled PT needs identified acutely. PT will complete orders at this time.   Maryanne Finder, PT, DPT Physical Therapist - [facility]   Orah Sonnen A Selam Pietsch 06/09/2024, 10:03 AM

## 2024-06-09 NOTE — Discharge Summary (Signed)
 Physician Discharge Summary   Patient: Manuel Stafford. MRN: [id number] DOB: 1945-08-17  Admit date:     06/08/2024  Discharge date: 06/09/2024  Discharge Physician: Murvin Mana   PCP: Manuel Charlie CROME, MD   Recommendations at discharge:   Follow-up with PCP in 1 week.  Discharge Diagnoses: Principal Problem:   Pneumonia Active Problems:   CAP (community acquired pneumonia)   Hypertension associated with diabetes (HCC)   Type 2 diabetes mellitus with diabetic chronic kidney disease (HCC)   Central sleep apnea   CKD stage 3a, GFR 45-59 ml/min (HCC)   Hyponatremia   Acute bronchitis Viral pleuritis. Uncontrolled type 2 diabetes with hyperglycemia Resolved Problems:   * No resolved hospital problems. *  Hospital Course: Manuel Stafford. is a 78 y.o. male with medical history significant of HTN, HLD, IIDM, chest pain BPH, presenting with worsening of cough and shortness of breath.  Per patient, his main complaint is productive cough and severe left lower chest pain with deep breath. He did not have hypoxemia, chest CT scan showed no PE, lower lobe possible pneumonia. proBNP was normal, procalcitonin level 0.18.  Patient does not seem to have a bacterial pneumonia, he could have a viral pneumonia still. Patient no longer has any short of breath, he still have cough, significant pleuritic chest pain.  At this point, patient be treated with albuterol , Advair, as well as lidocaine  ornament.  He is medically stable for discharge.  Patient also had significant hyperglycemia, this could be related to recent steroid use.  However, A1c 8.5.  Patient be followed with PCP to adjust medication.      Consultants: None Procedures performed: None  Disposition: Home Diet recommendation:  Discharge Diet Orders (From admission, onward)     Start     Ordered   06/09/24 0000  Diet Carb Modified        06/09/24 1154           Carb modified diet DISCHARGE  MEDICATION: Allergies as of 06/09/2024   No Known Allergies      Medication List     STOP taking these medications    ketoconazole  2 % cream Commonly known as: NIZORAL    predniSONE  20 MG tablet Commonly known as: DELTASONE        TAKE these medications    acetaminophen  500 MG tablet Commonly known as: TYLENOL  Take 500 mg by mouth.   albuterol  108 (90 Base) MCG/ACT inhaler Commonly known as: VENTOLIN  HFA Inhale 2 puffs into the lungs every 6 (six) hours as needed.   aspirin  EC 81 MG tablet Take 81 mg by mouth.   cyanocobalamin 1000 MCG tablet Commonly known as: VITAMIN B12 Take 1,000 mcg by mouth.   Farxiga 10 MG Tabs tablet Generic drug: dapagliflozin propanediol Take 10 mg by mouth daily.   fluticasone -salmeterol 250-50 MCG/ACT Aepb Commonly known as: Advair Diskus Inhale 1 puff into the lungs in the morning and at bedtime.   FreeStyle Libre 2 Plus Sensor Misc 1 each by Other route.   guaiFENesin  600 MG 12 hr tablet Commonly known as: MUCINEX  Take 2 tablets (1,200 mg total) by mouth 2 (two) times daily for 10 days.   hydrochlorothiazide  25 MG tablet Commonly known as: HYDRODIURIL  Take 25 mg by mouth daily.   lidocaine  5 % ointment Commonly known as: XYLOCAINE  Apply 1 Application topically as needed for up to 7 days. Apply to left flank   metFORMIN  1000 MG tablet Commonly known as: GLUCOPHAGE  Take 1,000 mg  by mouth 2 (two) times daily.   MULTIPLE VITAMIN PO Take 1 tablet by mouth daily.   Ozempic  (2 MG/DOSE) 8 MG/3ML Sopn Generic drug: Semaglutide  (2 MG/DOSE) 0.75 Milliliter(s) SUB-Q Once a Week   pantoprazole  40 MG tablet Commonly known as: PROTONIX  Take 40 mg by mouth daily.   pioglitazone  15 MG tablet Commonly known as: ACTOS  Take 15 mg by mouth daily.   rosuvastatin  20 MG tablet Commonly known as: CRESTOR  Take 20 mg by mouth daily.   Saw Palmetto  Berry 160 MG Caps Take 2 tablets by mouth daily.   tadalafil  5 MG tablet Commonly  known as: CIALIS  Take 1 tablet (5 mg total) by mouth daily.   tadalafil  20 MG tablet Commonly known as: CIALIS  Take up to 2 tablets (40 mg) prior to intercourse   tamsulosin  0.4 MG Caps capsule Commonly known as: FLOMAX  TAKE 1 CAPSULE BY MOUTH EVERY DAY   telmisartan 40 MG tablet Commonly known as: MICARDIS Take 1 tablet by mouth daily.   tiZANidine  4 MG tablet Commonly known as: ZANAFLEX  Take 4 mg by mouth.   Vitamin D3 50 MCG (2000 UT) capsule Take 2,000 Units by mouth daily.        Follow-up Information     Manuel Charlie CROME, MD Follow up in 1 week(s).   Specialty: Family Medicine Why: hospital follow up Contact information: [address] [place] [address] [phone]                Discharge Exam: Manuel Stafford   06/08/24 0810  Weight: 108 kg   General exam: Appears calm and comfortable  Respiratory system: Clear to auscultation. Respiratory effort normal. Cardiovascular system: S1 & S2 heard, RRR. No JVD, murmurs, rubs, gallops or clicks. No pedal edema. Gastrointestinal system: Abdomen is nondistended, soft and nontender. No organomegaly or masses felt. Normal bowel sounds heard. Central nervous system: Alert and oriented. No focal neurological deficits. Extremities: Symmetric 5 x 5 power. Skin: No rashes, lesions or ulcers Psychiatry: Judgement and insight appear normal. Mood & affect appropriate.    Condition at discharge: good  The results of significant diagnostics from this hospitalization (including imaging, microbiology, ancillary and laboratory) are listed below for reference.   Imaging Studies: CT Angio Chest PE W and/or Wo Contrast Result Date: 06/08/2024 CLINICAL DATA:  Concern for pulmonary embolism. EXAM: CT ANGIOGRAPHY CHEST WITH CONTRAST TECHNIQUE: Multidetector CT imaging of the chest was performed using the standard protocol during bolus administration of intravenous contrast. Multiplanar CT image reconstructions and  MIPs were obtained to evaluate the vascular anatomy. RADIATION DOSE REDUCTION: This exam was performed according to the departmental dose-optimization program which includes automated exposure control, adjustment of the mA and/or kV according to patient size and/or use of iterative reconstruction technique. CONTRAST:  60mL OMNIPAQUE  IOHEXOL  350 MG/ML SOLN COMPARISON:  Chest CT dated 07/09/2017. FINDINGS: Evaluation of this exam is limited due to respiratory motion. Cardiovascular: There is no cardiomegaly or pericardial effusion. There is coronary vascular calcification. Mild atherosclerotic calcification of the thoracic aorta. Maximal dilatation. No pulmonary artery embolus identified. Mediastinum/Nodes: No hilar or mediastinal adenopathy. The esophagus is grossly unremarkable no mediastinal fluid collection. Lungs/Pleura: There are bibasilar atelectasis. An area of nodular consolidation in the medial left lung base may represent pneumonia. Underlying mass is not excluded. Clinical correlation and follow-up to resolution recommended. Small left pleural effusion. No pneumothorax. The central airways are patent. Upper Abdomen: No acute abnormality. Musculoskeletal: Osteopenia with degenerative changes of spine. No acute osseous pathology.  Review of the MIP images confirms the above findings. IMPRESSION: 1. No CT evidence of pulmonary artery embolus. 2. Left lung base pneumonia. Clinical correlation and follow-up to resolution recommended. 3. Small left pleural effusion. 4.  Aortic Atherosclerosis (ICD10-I70.0). Electronically Signed   By: Vanetta Chou M.D.   On: 06/08/2024 10:48   DG Chest 2 View Result Date: 06/08/2024 CLINICAL DATA:  Shortness of breath. EXAM: CHEST - 2 VIEW COMPARISON:  Chest radiograph dated 04/11/2022 FINDINGS: Bibasilar atelectasis. Pneumonia is not excluded. No pleural effusion pneumothorax. The cardiac silhouette is within normal limits. No acute osseous pathology. IMPRESSION:  Bibasilar atelectasis. Pneumonia is not excluded. Electronically Signed   By: Vanetta Chou M.D.   On: 06/08/2024 08:57    Microbiology: Results for orders placed or performed during the hospital encounter of 06/08/24  Resp panel by RT-PCR (RSV, Flu A&B, Covid) Anterior Nasal Swab     Status: None   Collection Time: 06/08/24  8:44 AM   Specimen: Anterior Nasal Swab  Result Value Ref Range Status   SARS Coronavirus 2 by RT PCR NEGATIVE NEGATIVE Final    Comment: (NOTE) SARS-CoV-2 target nucleic acids are NOT DETECTED.  The SARS-CoV-2 RNA is generally detectable in upper respiratory specimens during the acute phase of infection. The lowest concentration of SARS-CoV-2 viral copies this assay can detect is 138 copies/mL. A negative result does not preclude SARS-Cov-2 infection and should not be used as the sole basis for treatment or other patient management decisions. A negative result may occur with  improper specimen collection/handling, submission of specimen other than nasopharyngeal swab, presence of viral mutation(s) within the areas targeted by this assay, and inadequate number of viral copies(<138 copies/mL). A negative result must be combined with clinical observations, patient history, and epidemiological information. The expected result is Negative.  Fact Sheet for Patients:  bloggercourse.com  Fact Sheet for Healthcare Providers:  seriousbroker.it  This test is no t yet approved or cleared by the [place]  FDA and  has been authorized for detection and/or diagnosis of SARS-CoV-2 by FDA under an Emergency Use Authorization (EUA). This EUA will remain  in effect (meaning this test can be used) for the duration of the COVID-19 declaration under Section 564(b)(1) of the Act, 21 U.S.C.section 360bbb-3(b)(1), unless the authorization is terminated  or revoked sooner.       Influenza A by PCR NEGATIVE NEGATIVE Final    Influenza B by PCR NEGATIVE NEGATIVE Final    Comment: (NOTE) The Xpert Xpress SARS-CoV-2/FLU/RSV plus assay is intended as an aid in the diagnosis of influenza from Nasopharyngeal swab specimens and should not be used as a sole basis for treatment. Nasal washings and aspirates are unacceptable for Xpert Xpress SARS-CoV-2/FLU/RSV testing.  Fact Sheet for Patients: bloggercourse.com  Fact Sheet for Healthcare Providers: seriousbroker.it  This test is not yet approved or cleared by the [place]  FDA and has been authorized for detection and/or diagnosis of SARS-CoV-2 by FDA under an Emergency Use Authorization (EUA). This EUA will remain in effect (meaning this test can be used) for the duration of the COVID-19 declaration under Section 564(b)(1) of the Act, 21 U.S.C. section 360bbb-3(b)(1), unless the authorization is terminated or revoked.     Resp Syncytial Virus by PCR NEGATIVE NEGATIVE Final    Comment: (NOTE) Fact Sheet for Patients: bloggercourse.com  Fact Sheet for Healthcare Providers: seriousbroker.it  This test is not yet approved or cleared by the [place]  FDA and has been authorized for detection and/or diagnosis of  SARS-CoV-2 by FDA under an Emergency Use Authorization (EUA). This EUA will remain in effect (meaning this test can be used) for the duration of the COVID-19 declaration under Section 564(b)(1) of the Act, 21 U.S.C. section 360bbb-3(b)(1), unless the authorization is terminated or revoked.  Performed at [facility], [address] [place], [place] [address]   MRSA Next Gen by PCR, Nasal     Status: None   Collection Time: 06/08/24  1:00 PM   Specimen: Nasal Mucosa; Nasal Swab  Result Value Ref Range Status   MRSA by PCR Next Gen NOT DETECTED NOT DETECTED Final    Comment: (NOTE) The GeneXpert MRSA Assay (FDA approved for NASAL  specimens only), is one component of a comprehensive MRSA colonization surveillance program. It is not intended to diagnose MRSA infection nor to guide or monitor treatment for MRSA infections. Test performance is not FDA approved in patients less than 68 years old. Performed at [facility], [address] [place], [place] [address]   Expectorated Sputum Assessment w Gram Stain, Rflx to Resp Cult     Status: None   Collection Time: 06/08/24  3:00 PM   Specimen: Expectorated Sputum  Result Value Ref Range Status   Specimen Description EXPECTORATED SPUTUM  Final   Special Requests NONE  Final   Sputum evaluation   Final    THIS SPECIMEN IS ACCEPTABLE FOR SPUTUM CULTURE Performed at [facility], [address], [place], [place] [address]    Report Status 06/08/2024 FINAL  Final  Culture, Respiratory w Gram Stain     Status: None (Preliminary result)   Collection Time: 06/08/24  3:00 PM  Result Value Ref Range Status   Specimen Description   Final    EXPECTORATED SPUTUM Performed at [facility], [address], [place], [place] [address]    Special Requests   Final    NONE Reflexed from [id number] Performed at [facility], [address] [place], [place] [address]    Gram Stain   Final    RARE WBC PRESENT, PREDOMINANTLY PMN FEW GRAM POSITIVE COCCI RARE GRAM NEGATIVE RODS RARE GRAM POSITIVE RODS Performed at [facility], [address] [place], [place] [address]    Culture PENDING  Incomplete   Report Status PENDING  Incomplete    Labs: CBC: Recent Labs  Lab 06/08/24 0820 06/09/24 0513  WBC 13.1* 11.9*  HGB 15.0 13.0  HCT 47.0 39.4  MCV 92.9 90.6  PLT 194 185   Basic Metabolic Panel: Recent Labs  Lab 06/08/24 0820 06/09/24 0513  NA 134* 133*  K 4.0 3.6  CL 99 100  CO2 24 23  GLUCOSE 290* 189*  BUN 22 22  CREATININE 1.66* 1.67*  CALCIUM  9.8 9.1   Liver Function Tests: No results for input(s): AST, ALT,  ALKPHOS, BILITOT, PROT, ALBUMIN in the last 168 hours. CBG: Recent Labs  Lab 06/08/24 1618 06/08/24 2222 06/09/24 0918  GLUCAP 234* 202* 219*    Discharge time spent: 35 minutes.  Signed: Murvin Mana, MD [facility] 06/09/2024

## 2024-06-09 NOTE — Progress Notes (Signed)
 The patient refused to take his irbesartan  150mg  tablet at bedtime. The patient said that he had never taken that medication before and he wanted to talk to his doctor first before taking the medication.

## 2024-06-10 LAB — LEGIONELLA PNEUMOPHILA SEROGP 1 UR AG: L. pneumophila Serogp 1 Ur Ag: NEGATIVE

## 2024-06-10 LAB — MYCOPLASMA PNEUMONIAE ANTIBODY, IGM: Mycoplasma pneumo IgM: <770 U/mL (ref 0–769)

## 2024-06-10 LAB — CULTURE, RESPIRATORY W GRAM STAIN: Culture: NORMAL

## 2024-10-13 ENCOUNTER — Ambulatory Visit: Admitting: Dermatology
